# Patient Record
Sex: Female | Born: 1955 | Race: White | Hispanic: No | Smoking: Never smoker
Health system: Southern US, Community
[De-identification: ages and names within clinical notes are randomized; demographics above are authoritative.]

## PROBLEM LIST (undated history)

## (undated) DIAGNOSIS — E119 Type 2 diabetes mellitus without complications: Secondary | ICD-10-CM

## (undated) DIAGNOSIS — I2510 Atherosclerotic heart disease of native coronary artery without angina pectoris: Secondary | ICD-10-CM

## (undated) DIAGNOSIS — I499 Cardiac arrhythmia, unspecified: Secondary | ICD-10-CM

## (undated) DIAGNOSIS — Z923 Personal history of irradiation: Secondary | ICD-10-CM

## (undated) DIAGNOSIS — I5020 Unspecified systolic (congestive) heart failure: Secondary | ICD-10-CM

## (undated) DIAGNOSIS — D0511 Intraductal carcinoma in situ of right breast: Secondary | ICD-10-CM

## (undated) DIAGNOSIS — I10 Essential (primary) hypertension: Secondary | ICD-10-CM

## (undated) DIAGNOSIS — E785 Hyperlipidemia, unspecified: Secondary | ICD-10-CM

## (undated) DIAGNOSIS — I4819 Other persistent atrial fibrillation: Secondary | ICD-10-CM

## (undated) DIAGNOSIS — I255 Ischemic cardiomyopathy: Secondary | ICD-10-CM

## (undated) DIAGNOSIS — I509 Heart failure, unspecified: Secondary | ICD-10-CM

## (undated) DIAGNOSIS — N611 Abscess of the breast and nipple: Secondary | ICD-10-CM

## (undated) DIAGNOSIS — E669 Obesity, unspecified: Secondary | ICD-10-CM

## (undated) DIAGNOSIS — K219 Gastro-esophageal reflux disease without esophagitis: Secondary | ICD-10-CM

## (undated) DIAGNOSIS — C50919 Malignant neoplasm of unspecified site of unspecified female breast: Secondary | ICD-10-CM

## (undated) DIAGNOSIS — G4730 Sleep apnea, unspecified: Secondary | ICD-10-CM

## (undated) DIAGNOSIS — E079 Disorder of thyroid, unspecified: Secondary | ICD-10-CM

## (undated) DIAGNOSIS — K429 Umbilical hernia without obstruction or gangrene: Secondary | ICD-10-CM

## (undated) HISTORY — DX: Sleep apnea, unspecified: G47.30

## (undated) HISTORY — DX: Heart failure, unspecified: I50.9

## (undated) HISTORY — DX: Intraductal carcinoma in situ of right breast: D05.11

## (undated) HISTORY — DX: Ischemic cardiomyopathy: I25.5

## (undated) HISTORY — PX: TONSILLECTOMY AND ADENOIDECTOMY: SUR1326

## (undated) HISTORY — DX: Gastro-esophageal reflux disease without esophagitis: K21.9

## (undated) HISTORY — DX: Unspecified systolic (congestive) heart failure: I50.20

## (undated) HISTORY — DX: Cardiac arrhythmia, unspecified: I49.9

## (undated) HISTORY — DX: Abscess of the breast and nipple: N61.1

## (undated) HISTORY — DX: Essential (primary) hypertension: I10

## (undated) HISTORY — DX: Malignant neoplasm of unspecified site of unspecified female breast: C50.919

## (undated) HISTORY — DX: Atherosclerotic heart disease of native coronary artery without angina pectoris: I25.10

## (undated) HISTORY — DX: Type 2 diabetes mellitus without complications: E11.9

## (undated) HISTORY — DX: Hyperlipidemia, unspecified: E78.5

## (undated) HISTORY — DX: Disorder of thyroid, unspecified: E07.9

## (undated) HISTORY — DX: Other persistent atrial fibrillation: I48.19

## (undated) HISTORY — DX: Obesity, unspecified: E66.9

## (undated) HISTORY — DX: Umbilical hernia without obstruction or gangrene: K42.9

---

## 2004-05-03 ENCOUNTER — Ambulatory Visit: Payer: Self-pay | Admitting: Family Medicine

## 2004-05-06 ENCOUNTER — Ambulatory Visit: Payer: Self-pay | Admitting: Family Medicine

## 2004-11-14 ENCOUNTER — Ambulatory Visit: Payer: Self-pay | Admitting: Family Medicine

## 2004-11-25 ENCOUNTER — Ambulatory Visit: Payer: Self-pay | Admitting: Family Medicine

## 2005-07-10 HISTORY — PX: BREAST BIOPSY: SHX20

## 2005-08-01 ENCOUNTER — Ambulatory Visit: Payer: Self-pay | Admitting: Family Medicine

## 2005-08-09 ENCOUNTER — Ambulatory Visit: Payer: Self-pay | Admitting: Family Medicine

## 2005-08-15 ENCOUNTER — Ambulatory Visit: Payer: Self-pay | Admitting: Family Medicine

## 2006-08-13 ENCOUNTER — Ambulatory Visit: Payer: Self-pay | Admitting: General Surgery

## 2007-04-22 ENCOUNTER — Ambulatory Visit: Payer: Self-pay | Admitting: General Surgery

## 2007-07-11 DIAGNOSIS — I2510 Atherosclerotic heart disease of native coronary artery without angina pectoris: Secondary | ICD-10-CM

## 2007-07-11 HISTORY — DX: Atherosclerotic heart disease of native coronary artery without angina pectoris: I25.10

## 2007-10-01 ENCOUNTER — Ambulatory Visit: Payer: Self-pay | Admitting: General Surgery

## 2008-01-08 ENCOUNTER — Ambulatory Visit: Payer: Self-pay | Admitting: Cardiovascular Disease

## 2008-01-08 HISTORY — PX: CARDIAC CATHETERIZATION: SHX172

## 2008-10-06 ENCOUNTER — Ambulatory Visit: Payer: Self-pay | Admitting: General Surgery

## 2009-06-28 ENCOUNTER — Ambulatory Visit: Payer: Self-pay | Admitting: Family Medicine

## 2009-07-10 HISTORY — PX: BREAST MASS EXCISION: SHX1267

## 2009-07-10 HISTORY — PX: BREAST EXCISIONAL BIOPSY: SUR124

## 2009-07-10 HISTORY — PX: COLONOSCOPY: SHX174

## 2010-01-14 ENCOUNTER — Ambulatory Visit: Payer: Self-pay | Admitting: Family Medicine

## 2010-02-03 ENCOUNTER — Ambulatory Visit: Payer: Self-pay | Admitting: Family Medicine

## 2010-02-18 ENCOUNTER — Ambulatory Visit: Payer: Self-pay | Admitting: Family Medicine

## 2010-04-11 LAB — HM DEXA SCAN: HM DEXA SCAN: NORMAL

## 2010-04-11 LAB — HM PAP SMEAR: HM Pap smear: NORMAL

## 2010-04-26 ENCOUNTER — Ambulatory Visit: Payer: Self-pay | Admitting: Family Medicine

## 2010-05-09 ENCOUNTER — Ambulatory Visit: Payer: Self-pay | Admitting: Family Medicine

## 2010-05-11 ENCOUNTER — Encounter: Payer: Self-pay | Admitting: Orthopedic Surgery

## 2010-06-09 ENCOUNTER — Encounter: Payer: Self-pay | Admitting: Orthopedic Surgery

## 2010-06-16 ENCOUNTER — Ambulatory Visit: Payer: Self-pay | Admitting: Gastroenterology

## 2010-07-05 ENCOUNTER — Ambulatory Visit: Payer: Self-pay | Admitting: General Surgery

## 2010-07-10 DIAGNOSIS — D0511 Intraductal carcinoma in situ of right breast: Secondary | ICD-10-CM

## 2010-07-10 HISTORY — PX: BREAST LUMPECTOMY: SHX2

## 2010-07-10 HISTORY — DX: Intraductal carcinoma in situ of right breast: D05.11

## 2010-07-13 ENCOUNTER — Ambulatory Visit: Payer: Self-pay | Admitting: General Surgery

## 2010-07-13 DIAGNOSIS — C50919 Malignant neoplasm of unspecified site of unspecified female breast: Secondary | ICD-10-CM

## 2010-07-13 HISTORY — DX: Malignant neoplasm of unspecified site of unspecified female breast: C50.919

## 2010-07-21 LAB — PATHOLOGY REPORT

## 2010-08-03 ENCOUNTER — Ambulatory Visit: Payer: Self-pay | Admitting: Radiation Oncology

## 2010-08-10 ENCOUNTER — Ambulatory Visit: Payer: Self-pay | Admitting: Radiation Oncology

## 2010-09-08 ENCOUNTER — Ambulatory Visit: Payer: Self-pay | Admitting: Radiation Oncology

## 2011-03-17 ENCOUNTER — Ambulatory Visit: Payer: Self-pay | Admitting: Rheumatology

## 2011-03-29 ENCOUNTER — Ambulatory Visit: Payer: Self-pay | Admitting: Radiation Oncology

## 2011-04-10 ENCOUNTER — Ambulatory Visit: Payer: Self-pay | Admitting: Radiation Oncology

## 2011-05-04 ENCOUNTER — Ambulatory Visit: Payer: Self-pay | Admitting: General Surgery

## 2011-07-11 HISTORY — PX: HERNIA REPAIR: SHX51

## 2011-07-11 HISTORY — PX: CHOLECYSTECTOMY: SHX55

## 2011-09-28 ENCOUNTER — Ambulatory Visit: Payer: Self-pay | Admitting: Radiation Oncology

## 2011-10-09 ENCOUNTER — Ambulatory Visit: Payer: Self-pay | Admitting: Radiation Oncology

## 2011-11-09 ENCOUNTER — Ambulatory Visit: Payer: Self-pay | Admitting: General Surgery

## 2011-12-06 ENCOUNTER — Encounter: Payer: Self-pay | Admitting: Cardiovascular Disease

## 2011-12-07 ENCOUNTER — Encounter: Payer: Self-pay | Admitting: *Deleted

## 2011-12-13 ENCOUNTER — Ambulatory Visit (INDEPENDENT_AMBULATORY_CARE_PROVIDER_SITE_OTHER): Payer: 59 | Admitting: Cardiovascular Disease

## 2011-12-13 ENCOUNTER — Encounter: Payer: Self-pay | Admitting: Cardiovascular Disease

## 2011-12-13 VITALS — BP 140/88 | HR 101 | Ht 62.0 in | Wt 265.0 lb

## 2011-12-13 DIAGNOSIS — I2510 Atherosclerotic heart disease of native coronary artery without angina pectoris: Secondary | ICD-10-CM

## 2011-12-13 DIAGNOSIS — E785 Hyperlipidemia, unspecified: Secondary | ICD-10-CM

## 2011-12-13 DIAGNOSIS — E669 Obesity, unspecified: Secondary | ICD-10-CM

## 2011-12-13 DIAGNOSIS — I25118 Atherosclerotic heart disease of native coronary artery with other forms of angina pectoris: Secondary | ICD-10-CM | POA: Insufficient documentation

## 2011-12-13 DIAGNOSIS — R0602 Shortness of breath: Secondary | ICD-10-CM

## 2011-12-13 DIAGNOSIS — I10 Essential (primary) hypertension: Secondary | ICD-10-CM

## 2011-12-13 NOTE — Patient Instructions (Signed)
You are doing well. Please add bystolic 10 mg in the PM (continue 20 mg tab in the AM)  Please call us if you have new issues that need to be addressed before your next appt.  Your physician wants you to follow-up in: 1 months  After echo You will receive a reminder letter in the mail two months in advance. If you don't receive a letter, please call our office to schedule the follow-up appointment.

## 2011-12-13 NOTE — Assessment & Plan Note (Signed)
She is not compliant with her Lipitor. We have suggested she take his daily given her elevated LDL particle number.

## 2011-12-13 NOTE — Progress Notes (Signed)
Patient ID: Michele [name], female    DOB: 05/06/1956, 56 y.o.   MRN: [id number]  HPI Comments: Michele [name] is a very pleasant 44 her old woman with morbid obesity, coronary artery disease with 50% proximal LAD disease , followed by a 30% lesion by catheterization July 2009, diabetes, hyperlipidemia, ejection fraction 30% by echocardiogram in June 2009, moderately dilated left atrium and left ventricle, who presents for evaluation of worsening shortness of breath and fatigue. She does have obstructive sleep apnea and wears CPAP.  She reports that symptoms have been getting worse over the past several months. She has worsening shortness of breath with walking to work 2 blocks from her parking lot. She did not have this symptom of shortness of breath before. She also has worsening fatigue. Symptoms are mild, not as bad as 2009. She reports that her sugars have been higher and she has been drinking more fluids. She does have mild edema. She denies any significant chest pain. She is not very compliant with her Lipitor and takes this several times per week. She reports that her weight has been stable. She did start Victoza 10/10/2011 with improvement of her sugars from the low 200s to approximately 185 on regular basis.  EKG shows normal sinus rhythm with rate 100 beats per minute with frequent APCs  Labwork done December 2012 shows LDL 73, total cholesterol 409, HDL 33, LDL particle numbers 1700   Outpatient Encounter Prescriptions as of 12/13/2011  Medication Sig Dispense Refill  . amLODipine-valsartan (EXFORGE) 10-320 MG per tablet Take 1 tablet by mouth daily.      Marland Kitchen aspirin 325 MG tablet Take 325 mg by mouth daily.      Marland Kitchen atorvastatin (LIPITOR) 20 MG tablet Take 20 mg by mouth daily.      . Febuxostat (ULORIC) 80 MG TABS Take 80 mg by mouth daily.      . fluticasone (FLONASE) 50 MCG/ACT nasal spray Place 2 sprays into the nose as needed.      Marland Kitchen levothyroxine (SYNTHROID, LEVOTHROID) 150 MCG tablet  Take 150 mcg by mouth daily.      . Liraglutide (VICTOZA [place]) Inject 1.2 mg into the skin daily.      . Nebivolol HCl (BYSTOLIC PO) Take 20 mg by mouth daily.      . NON FORMULARY CPAP AT BEDTIME      . Omeprazole Magnesium (PRILOSEC OTC PO) Take 20.6 mg by mouth daily.      . sitaGLIPtan-metformin (JANUMET) 50-1000 MG per tablet Take 1 tablet by mouth 2 (two) times daily with a meal.      . tamoxifen (NOLVADEX) 20 MG tablet Take 20 mg by mouth daily.        Review of Systems  Constitutional: Positive for fatigue.  HENT: Negative.   Eyes: Negative.   Respiratory: Positive for shortness of breath.   Cardiovascular: Positive for leg swelling.  Gastrointestinal: Negative.   Musculoskeletal: Negative.   Skin: Negative.   Neurological: Negative.   Hematological: Negative.   Psychiatric/Behavioral: Negative.   All other systems reviewed and are negative.    BP 140/88  Pulse 101  Ht 5\' 2"  (1.575 m)  Wt 265 lb (120.203 kg)  BMI 48.47 kg/m2  Physical Exam  Nursing note and vitals reviewed. Constitutional: She is oriented to person, place, and time. She appears well-developed and well-nourished.       Obese  HENT:  Head: Normocephalic.  Nose: Nose normal.  Mouth/Throat: Oropharynx is clear and moist.  Eyes: Conjunctivae are normal. Pupils are equal, round, and reactive to light.  Neck: Normal range of motion. Neck supple. No JVD present.  Cardiovascular: Normal rate, regular rhythm, S1 normal, S2 normal, normal heart sounds and intact distal pulses.  Exam reveals no gallop and no friction rub.   No murmur heard.      Trace bilateral lower extremity edema above the sock line  Pulmonary/Chest: Effort normal and breath sounds normal. No respiratory distress. She has no wheezes. She has no rales. She exhibits no tenderness.  Abdominal: Soft. Bowel sounds are normal. She exhibits no distension. There is no tenderness.  Musculoskeletal: Normal range of motion. She exhibits no edema and  no tenderness.  Lymphadenopathy:    She has no cervical adenopathy.  Neurological: She is alert and oriented to person, place, and time. Coordination normal.  Skin: Skin is warm and dry. No rash noted. No erythema.  Psychiatric: She has a normal mood and affect. Her behavior is normal. Judgment and thought content normal.         Assessment and Plan

## 2011-12-13 NOTE — Assessment & Plan Note (Signed)
Previous 50% proximal LAD disease. If no improvement of her symptoms with medical management, if symptoms get worse, we would suggest cardiac catheterization.

## 2011-12-13 NOTE — Assessment & Plan Note (Signed)
Mildly elevated blood pressure. Will have extra bystolic 10 mg in the afternoon.

## 2011-12-13 NOTE — Assessment & Plan Note (Addendum)
We had a long discussion with her about her shortness of breath. She does not want to proceed with cardiac catheterization at this time and would prefer to try medical management. I'm concerned about her previous 50% lesion in her LAD on catheterization in 2009.  We have ordered an echocardiogram to exclude fluid overload. She does have trace edema and has been drinking more fluids at work. She is currently not on a diuretic secondary to previous gout.  Heart rate is elevated today in the mid to high 90s, up to 100 even with rest. Uncertain if her heart rate could be contributing to her symptoms as this would get worse with exertion. Blood pressure is elevated mildly. We'll add additional bystolic 10 mg in the afternoon/evening. This could possibly be titrated upwards to 20 mg twice a day. She will monitor her heart rate and blood pressure at home and if possible, with exertion.  Followup in one month

## 2012-01-09 ENCOUNTER — Other Ambulatory Visit (INDEPENDENT_AMBULATORY_CARE_PROVIDER_SITE_OTHER): Payer: 59

## 2012-01-09 ENCOUNTER — Other Ambulatory Visit: Payer: Self-pay

## 2012-01-09 DIAGNOSIS — R0602 Shortness of breath: Secondary | ICD-10-CM

## 2012-01-12 ENCOUNTER — Ambulatory Visit (INDEPENDENT_AMBULATORY_CARE_PROVIDER_SITE_OTHER): Payer: 59 | Admitting: Cardiovascular Disease

## 2012-01-12 ENCOUNTER — Encounter: Payer: Self-pay | Admitting: Cardiovascular Disease

## 2012-01-12 VITALS — BP 162/80 | HR 92 | Ht 62.5 in | Wt 265.0 lb

## 2012-01-12 DIAGNOSIS — R0602 Shortness of breath: Secondary | ICD-10-CM

## 2012-01-12 DIAGNOSIS — E785 Hyperlipidemia, unspecified: Secondary | ICD-10-CM

## 2012-01-12 DIAGNOSIS — I10 Essential (primary) hypertension: Secondary | ICD-10-CM

## 2012-01-12 DIAGNOSIS — I2510 Atherosclerotic heart disease of native coronary artery without angina pectoris: Secondary | ICD-10-CM

## 2012-01-12 MED ORDER — NEBIVOLOL HCL 20 MG PO TABS: 20.0000 mg | ORAL_TABLET | Freq: Two times a day (BID) | ORAL | Status: DC

## 2012-01-12 MED ORDER — CLONIDINE HCL 0.2 MG/24HR TD PTWK: 1.0000 | MEDICATED_PATCH | TRANSDERMAL | Status: DC

## 2012-01-12 NOTE — Progress Notes (Signed)
Patient ID: Michele [name], female    DOB: 11/29/1955, 56 y.o.   MRN: [id number]  HPI Comments: Michele [name] is a very pleasant 56 her old woman with morbid obesity, coronary artery disease with 50% proximal LAD disease , followed by a 30% lesion by catheterization July 2009, diabetes, hyperlipidemia, ejection fraction 30% by echocardiogram in June 2009, moderately dilated left atrium and left ventricle, who presents for evaluation of worsening shortness of breath and fatigue. She does have obstructive sleep apnea and wears CPAP. She has some medication compliance issues as she does forget some of her pills. She has had gout before while on diuretics  On her last clinic visit, she was having worsening shortness of breath with exertion. Recent echocardiogram showed normal LV systolic function, normal right ventricular systolic pressures We increased her beta blocker with mild improvement in her heart rate. She continues to have mild to moderate shortness of breath with exertion. She does not exercise on a regular basis. She has been forgetting some of her medication including her cholesterol medication, aspirin, etc.. in She has not been checking her blood pressure at home but does have a blood pressure cuff.  EKG shows normal sinus rhythm with rate 96 beats per minute with frequent APCs  Labwork done December 2012 shows LDL 73, total cholesterol 409, HDL 33, LDL particle numbers 1700   Outpatient Encounter Prescriptions as of 01/12/2012  Medication Sig Dispense Refill  . amLODipine-valsartan (EXFORGE) 10-320 MG per tablet Take 1 tablet by mouth daily.      Marland Kitchen aspirin 325 MG tablet Take 325 mg by mouth daily.      Marland Kitchen atorvastatin (LIPITOR) 20 MG tablet Take 20 mg by mouth daily.      . Febuxostat (ULORIC) 80 MG TABS Take 80 mg by mouth daily.      . fluticasone (FLONASE) 50 MCG/ACT nasal spray Place 2 sprays into the nose as needed.      Marland Kitchen levothyroxine (SYNTHROID, LEVOTHROID) 150 MCG tablet Take  150 mcg by mouth daily.      . Liraglutide (VICTOZA [place]) Inject 1.2 mg into the skin daily.      . NON FORMULARY CPAP AT BEDTIME      . Omeprazole Magnesium (PRILOSEC OTC PO) Take 20.6 mg by mouth daily.      . sitaGLIPtan-metformin (JANUMET) 50-1000 MG per tablet Take 1 tablet by mouth 2 (two) times daily with a meal.      . tamoxifen (NOLVADEX) 20 MG tablet Take 20 mg by mouth daily.      .  Nebivolol HCl (BYSTOLIC PO) Take 30 mg by mouth daily.        Review of Systems  HENT: Negative.   Eyes: Negative.   Respiratory: Positive for shortness of breath.   Gastrointestinal: Negative.   Musculoskeletal: Negative.   Skin: Negative.   Neurological: Negative.   Hematological: Negative.   Psychiatric/Behavioral: Negative.   All other systems reviewed and are negative.   BP 162/80  Pulse 92  Ht 5' 2.5" (1.588 m)  Wt 265 lb (120.203 kg)  BMI 47.70 kg/m2 Repeat blood pressure shows systolic pressure greater than 170 Physical Exam  Nursing note and vitals reviewed. Constitutional: She is oriented to person, place, and time. She appears well-developed and well-nourished.       Obese  HENT:  Head: Normocephalic.  Nose: Nose normal.  Mouth/Throat: Oropharynx is clear and moist.  Eyes: Conjunctivae are normal. Pupils are equal, round, and reactive to light.  Neck: Normal range of motion. Neck supple. No JVD present.  Cardiovascular: Normal rate, regular rhythm, S1 normal, S2 normal, normal heart sounds and intact distal pulses.  Exam reveals no gallop and no friction rub.   No murmur heard.      Trace bilateral lower extremity edema above the sock line  Pulmonary/Chest: Effort normal and breath sounds normal. No respiratory distress. She has no wheezes. She has no rales. She exhibits no tenderness.  Abdominal: Soft. Bowel sounds are normal. She exhibits no distension. There is no tenderness.  Musculoskeletal: Normal range of motion. She exhibits no edema and no tenderness.    Lymphadenopathy:    She has no cervical adenopathy.  Neurological: She is alert and oriented to person, place, and time. Coordination normal.  Skin: Skin is warm and dry. No rash noted. No erythema.  Psychiatric: She has a normal mood and affect. Her behavior is normal. Judgment and thought content normal.         Assessment and Plan

## 2012-01-12 NOTE — Patient Instructions (Addendum)
Please increase bystolic to 20 mg twice a day  Please start clonidine one patch per week Monitor blood pressure  Please call us if you have new issues that need to be addressed before your next appt.  Your physician wants you to follow-up in: 2 months.

## 2012-01-12 NOTE — Assessment & Plan Note (Signed)
Normal echocardiogram. She did not want catheterization based on her previous visit. Normal right jugular systolic pressure. We will not use diuretics given normal echo. We will work on her blood pressure and heart rate. Medication changes as above.

## 2012-01-12 NOTE — Assessment & Plan Note (Signed)
Currently with no symptoms of angina. No further workup at this time. Continue current medication regimen. 

## 2012-01-12 NOTE — Assessment & Plan Note (Signed)
We have encouraged her to work on her weight. Continue Lipitor

## 2012-01-12 NOTE — Assessment & Plan Note (Signed)
Blood pressure continues to be elevated. We will add extra beta blocker, increasing the bystolic to 20 mg twice a day. She has difficulty taking pills and forgets to take them. We will try a clonidine patch 0.2 mg one patch per week. This can be titrated upwards as tolerated.

## 2012-03-01 ENCOUNTER — Ambulatory Visit: Payer: Self-pay | Admitting: Family Medicine

## 2012-03-14 ENCOUNTER — Encounter: Payer: Self-pay | Admitting: Cardiovascular Disease

## 2012-03-14 ENCOUNTER — Ambulatory Visit (INDEPENDENT_AMBULATORY_CARE_PROVIDER_SITE_OTHER): Payer: 59 | Admitting: Cardiovascular Disease

## 2012-03-14 VITALS — BP 142/90 | HR 88 | Ht 62.0 in | Wt 259.8 lb

## 2012-03-14 DIAGNOSIS — E785 Hyperlipidemia, unspecified: Secondary | ICD-10-CM

## 2012-03-14 DIAGNOSIS — E669 Obesity, unspecified: Secondary | ICD-10-CM

## 2012-03-14 DIAGNOSIS — I10 Essential (primary) hypertension: Secondary | ICD-10-CM

## 2012-03-14 DIAGNOSIS — E119 Type 2 diabetes mellitus without complications: Secondary | ICD-10-CM | POA: Insufficient documentation

## 2012-03-14 DIAGNOSIS — I2510 Atherosclerotic heart disease of native coronary artery without angina pectoris: Secondary | ICD-10-CM

## 2012-03-14 MED ORDER — CLONIDINE HCL 0.2 MG/24HR TD PTWK: 1.0000 | MEDICATED_PATCH | TRANSDERMAL | Status: DC

## 2012-03-14 NOTE — Assessment & Plan Note (Signed)
We have encouraged continued exercise, careful diet management in an effort to lose weight. 

## 2012-03-14 NOTE — Assessment & Plan Note (Signed)
Currently with no symptoms of angina. No further workup at this time. Continue current medication regimen. She would be acceptable risk for upcoming gallbladder surgery. no further testing.

## 2012-03-14 NOTE — Patient Instructions (Addendum)
You are doing well. No medication changes were made.  Please call us if you have new issues that need to be addressed before your next appt.  Your physician wants you to follow-up in: 6 months.  You will receive a reminder letter in the mail two months in advance. If you don't receive a letter, please call our office to schedule the follow-up appointment.   

## 2012-03-14 NOTE — Assessment & Plan Note (Signed)
We have recommended that she be consistent with her Lipitor. Goal LDL less than 70.  She has been noncompliant recently.

## 2012-03-14 NOTE — Progress Notes (Signed)
Patient ID: Michele [name], female    DOB: 1956-04-27, 56 y.o.   MRN: [id number]  HPI Comments: Michele [name] is a very pleasant 56 her old woman with morbid obesity, coronary artery disease with 50% proximal LAD disease , followed by a 30% lesion by catheterization July 2009, diabetes, hyperlipidemia, ejection fraction 30% by echocardiogram in June 2009, moderately dilated left atrium and left ventricle, who presents for evaluation of worsening shortness of breath and fatigue. She does have obstructive sleep apnea and wears CPAP. She has some medication compliance issues as she does forget some of her pills. She has had gout before while on diuretics  She reports that her shortness of breath has improved. She is tolerating clonidine patch well with no side effects. She continues on beta blocker twice a day and exforge. A pressure typically runs in the 120-140 systolic range over 80s. She is scheduled to have gallbladder surgery. She has a flareup of gout and a sty on her right eye.  Previous echocardiogram showed normal LV systolic function, normal right ventricular systolic pressures  EKG shows normal sinus rhythm with rate 96 beats per minute with frequent APCs Labwork done December 2012 shows LDL 73, total cholesterol 409, HDL 33, LDL particle numbers 1700 (noncompliant with statin) are in 1 a 50   Outpatient Encounter Prescriptions as of 03/14/2012  Medication Sig Dispense Refill  . amLODipine-valsartan (EXFORGE) 10-320 MG per tablet Take 1 tablet by mouth daily.      Marland Kitchen aspirin 325 MG tablet Take 325 mg by mouth daily.      Marland Kitchen atorvastatin (LIPITOR) 20 MG tablet Take 20 mg by mouth daily.      . cloNIDine (CATAPRES - DOSED IN MG/24 HR) 0.2 mg/24hr patch Place 1 patch (0.2 mg total) onto the skin once a week.  12 patch  3  . colchicine 0.6 MG tablet Take 0.6 mg by mouth daily.      . Febuxostat (ULORIC) 80 MG TABS Take 80 mg by mouth daily.      . fluticasone (FLONASE) 50 MCG/ACT nasal spray  Place 2 sprays into the nose as needed.      Marland Kitchen levothyroxine (SYNTHROID, LEVOTHROID) 175 MCG tablet Take 175 mcg by mouth daily.      . Liraglutide (VICTOZA) 18 MG/3ML SOLN Inject 1.8 mg into the skin.      . Nebivolol HCl (BYSTOLIC) 20 MG TABS Take 1 tablet (20 mg total) by mouth 2 (two) times daily.  180 tablet  3  . NON FORMULARY CPAP AT BEDTIME      . Omeprazole Magnesium (PRILOSEC OTC PO) Take 20.6 mg by mouth daily.      . sitaGLIPtan-metformin (JANUMET) 50-1000 MG per tablet Take 1 tablet by mouth 2 (two) times daily with a meal.      . tamoxifen (NOLVADEX) 20 MG tablet Take 20 mg by mouth daily.        Review of Systems  HENT: Negative.   Eyes: Negative.   Gastrointestinal: Negative.   Musculoskeletal: Negative.   Skin: Negative.   Neurological: Negative.   Hematological: Negative.   Psychiatric/Behavioral: Negative.   All other systems reviewed and are negative.   BP 142/90  Pulse 88  Ht 5\' 2"  (1.575 m)  Wt 259 lb 12 oz (117.822 kg)  BMI 47.51 kg/m2  Physical Exam  Nursing note and vitals reviewed. Constitutional: She is oriented to person, place, and time. She appears well-developed and well-nourished.       Obese  Sty on her right eye  HENT:  Head: Normocephalic.  Nose: Nose normal.  Mouth/Throat: Oropharynx is clear and moist.  Eyes: Conjunctivae are normal. Pupils are equal, round, and reactive to light.  Neck: Normal range of motion. Neck supple. No JVD present.  Cardiovascular: Normal rate, regular rhythm, S1 normal, S2 normal, normal heart sounds and intact distal pulses.  Exam reveals no gallop and no friction rub.   No murmur heard.      Trace bilateral lower extremity edema above the sock line  Pulmonary/Chest: Effort normal and breath sounds normal. No respiratory distress. She has no wheezes. She has no rales. She exhibits no tenderness.  Abdominal: Soft. Bowel sounds are normal. She exhibits no distension. There is no tenderness.  Musculoskeletal:  Normal range of motion. She exhibits no edema and no tenderness.  Lymphadenopathy:    She has no cervical adenopathy.  Neurological: She is alert and oriented to person, place, and time. Coordination normal.  Skin: Skin is warm and dry. No rash noted. No erythema.  Psychiatric: She has a normal mood and affect. Her behavior is normal. Judgment and thought content normal.         Assessment and Plan

## 2012-03-14 NOTE — Assessment & Plan Note (Signed)
Blood pressure is well controlled on today's visit. No changes made to the medications. 

## 2012-03-14 NOTE — Assessment & Plan Note (Signed)
She reports hemoglobin A1c in the 8 range. We have encouraged her to watch her diet more closely.

## 2012-03-19 ENCOUNTER — Ambulatory Visit: Payer: Self-pay | Admitting: General Surgery

## 2012-03-19 LAB — CBC WITH DIFFERENTIAL/PLATELET
Basophil #: 0.1 10*3/uL (ref 0.0–0.1)
Eosinophil #: 0.2 10*3/uL (ref 0.0–0.7)
Eosinophil %: 1.5 %
HCT: 38.8 % (ref 35.0–47.0)
HGB: 13.4 g/dL (ref 12.0–16.0)
Lymphocyte #: 3.2 10*3/uL (ref 1.0–3.6)
Monocyte #: 0.9 x10 3/mm (ref 0.2–0.9)
Neutrophil %: 58.2 %
RDW: 12.8 % (ref 11.5–14.5)
WBC: 10.5 10*3/uL (ref 3.6–11.0)

## 2012-03-21 ENCOUNTER — Ambulatory Visit: Payer: Self-pay | Admitting: General Surgery

## 2012-03-22 LAB — PATHOLOGY REPORT

## 2012-05-13 ENCOUNTER — Ambulatory Visit: Payer: Self-pay | Admitting: General Surgery

## 2012-08-19 DIAGNOSIS — N611 Abscess of the breast and nipple: Secondary | ICD-10-CM

## 2012-08-19 HISTORY — PX: INCISION AND DRAINAGE BREAST ABSCESS: SUR672

## 2012-08-19 HISTORY — DX: Abscess of the breast and nipple: N61.1

## 2012-08-22 ENCOUNTER — Encounter: Payer: Self-pay | Admitting: General Surgery

## 2012-09-25 ENCOUNTER — Ambulatory Visit: Payer: Self-pay | Admitting: Radiation Oncology

## 2012-09-26 ENCOUNTER — Encounter: Payer: Self-pay | Admitting: General Surgery

## 2012-09-26 ENCOUNTER — Ambulatory Visit (INDEPENDENT_AMBULATORY_CARE_PROVIDER_SITE_OTHER): Payer: 59 | Admitting: General Surgery

## 2012-09-26 VITALS — BP 140/76 | HR 76 | Resp 14 | Ht 62.0 in | Wt 250.0 lb

## 2012-09-26 DIAGNOSIS — N641 Fat necrosis of breast: Secondary | ICD-10-CM

## 2012-09-26 NOTE — Patient Instructions (Addendum)
Call for concerns or questions.

## 2012-09-26 NOTE — Progress Notes (Signed)
Subjective:     Patient ID: Michele [name], female   DOB: August 27, 1955, 57 y.o.   MRN: [id number]  HPI Patient here today for follow up right breast abscess.  States area is much smaller but still draining some.  Feels like it is "pinching " occasionally.   Review of Systems  Constitutional: Negative.   Respiratory: Negative.   Cardiovascular: Negative.        Objective:   Physical Exam  Constitutional: She is oriented to person, place, and time. She appears well-developed and well-nourished.  Neurological: She is alert and oriented to person, place, and time.  Skin: Skin is warm and dry.   Area of concern is improving.     Assessment:     Fat necrosis     Plan:     The area of induration and inflammation is significantly smaller. Probing showed a small amount of saponified fat without odor. I anticipate this will want to resolve spontaneously and not require formal excision.

## 2012-10-16 ENCOUNTER — Ambulatory Visit (INDEPENDENT_AMBULATORY_CARE_PROVIDER_SITE_OTHER): Payer: 59 | Admitting: General Surgery

## 2012-10-16 ENCOUNTER — Encounter: Payer: Self-pay | Admitting: General Surgery

## 2012-10-16 VITALS — BP 138/70 | HR 82 | Resp 16 | Ht 62.0 in | Wt 251.0 lb

## 2012-10-16 DIAGNOSIS — N641 Fat necrosis of breast: Secondary | ICD-10-CM

## 2012-10-16 DIAGNOSIS — N61 Mastitis without abscess: Secondary | ICD-10-CM

## 2012-10-16 DIAGNOSIS — Z853 Personal history of malignant neoplasm of breast: Secondary | ICD-10-CM

## 2012-10-16 NOTE — Patient Instructions (Addendum)
Patient to return in November 2014 with bilateral diagnostic mammograms.

## 2012-10-16 NOTE — Progress Notes (Signed)
Patient ID: Michele [name], female   DOB: 26-Jan-1956, 57 y.o.   MRN: [id number]  Chief Complaint  Patient presents with  . Follow-up    breast abscess    HPI Michele [name] is a 57 y.o. female here today following up from her right breast abscess. Patient reports the breast is not red or swollen.   HPI  Past Medical History  Diagnosis Date  . Hypertension   . CHF (congestive heart failure)   . Breast cancer   . Ischemic cardiomyopathy   . Diabetes mellitus     Type II  . Hyperlipidemia   . Coronary artery disease 2009  . Thyroid disease     hypothyroidism  . DCIS (ductal carcinoma in situ) of breast, right 2012  . GERD (gastroesophageal reflux disease)   . Sleep apnea   . Abscess of breast, right 08/19/2012    Past Surgical History  Procedure Laterality Date  . Cardiac catheterization  01/08/2008  . Incision and drainage breast abscess Right 08/19/2012  . Breast lumpectomy Right Jan 2012    Family History  Problem Relation Age of Onset  . Heart failure Mother     Social History History  Substance Use Topics  . Smoking status: Never Smoker   . Smokeless tobacco: Never Used  . Alcohol Use: No    No Known Allergies  Current Outpatient Prescriptions  Medication Sig Dispense Refill  . amLODipine-valsartan (EXFORGE) 10-320 MG per tablet Take 1 tablet by mouth daily.      Marland Kitchen aspirin 325 MG tablet Take 325 mg by mouth daily.      Marland Kitchen atorvastatin (LIPITOR) 20 MG tablet Take 20 mg by mouth daily.      . cloNIDine (CATAPRES - DOSED IN MG/24 HR) 0.2 mg/24hr patch Place 1 patch (0.2 mg total) onto the skin once a week.  12 patch  3  . colchicine 0.6 MG tablet Take 0.6 mg by mouth daily.      . Febuxostat (ULORIC) 80 MG TABS Take 80 mg by mouth daily.      . fluticasone (FLONASE) 50 MCG/ACT nasal spray Place 2 sprays into the nose as needed.      . INVOKANA 100 MG TABS 1 tablet.      Marland Kitchen levothyroxine (SYNTHROID, LEVOTHROID) 175 MCG tablet Take 175 mcg by mouth daily.       . Liraglutide (VICTOZA) 18 MG/3ML SOLN Inject 1.8 mg into the skin.      . Nebivolol HCl (BYSTOLIC) 20 MG TABS Take 1 tablet (20 mg total) by mouth 2 (two) times daily.  180 tablet  3  . NON FORMULARY CPAP AT BEDTIME      . Omeprazole Magnesium (PRILOSEC OTC PO) Take 20.6 mg by mouth daily.      . sitaGLIPtan-metformin (JANUMET) 50-1000 MG per tablet Take 1 tablet by mouth 2 (two) times daily with a meal.      . tamoxifen (NOLVADEX) 20 MG tablet Take 20 mg by mouth daily.       No current facility-administered medications for this visit.    Review of Systems Review of Systems  Constitutional: Negative.   Respiratory: Negative.   Cardiovascular: Negative.     Blood pressure 138/70, pulse 82, resp. rate 16, height 5\' 2"  (1.575 m), weight 251 lb (113.853 kg).  Physical Exam Physical Exam  Constitutional: She appears well-developed and well-nourished.  Pulmonary/Chest:  Right breast healing well used a silver nitrate.   Skin: Skin is dry.    Data  Reviewed None  Assessment    Resolving aseptic fat necrosis of the right breast    Plan    Will resume annual screening exam as previously scheduled. The patient will call she has a relapse in the breast.       Earline Mayotte 10/17/2012, 4:09 PM

## 2012-10-17 ENCOUNTER — Encounter: Payer: Self-pay | Admitting: General Surgery

## 2012-12-24 ENCOUNTER — Encounter: Payer: Self-pay | Admitting: *Deleted

## 2013-05-27 ENCOUNTER — Ambulatory Visit: Payer: Self-pay | Admitting: General Surgery

## 2013-06-03 ENCOUNTER — Encounter: Payer: Self-pay | Admitting: General Surgery

## 2013-06-11 ENCOUNTER — Ambulatory Visit: Payer: 59 | Admitting: General Surgery

## 2013-06-19 ENCOUNTER — Encounter: Payer: Self-pay | Admitting: General Surgery

## 2013-06-19 ENCOUNTER — Ambulatory Visit (INDEPENDENT_AMBULATORY_CARE_PROVIDER_SITE_OTHER): Payer: 59 | Admitting: General Surgery

## 2013-06-19 VITALS — BP 140/82 | HR 72 | Resp 14 | Ht 62.0 in | Wt 238.0 lb

## 2013-06-19 DIAGNOSIS — R920 Mammographic microcalcification found on diagnostic imaging of breast: Secondary | ICD-10-CM | POA: Insufficient documentation

## 2013-06-19 DIAGNOSIS — Z853 Personal history of malignant neoplasm of breast: Secondary | ICD-10-CM

## 2013-06-19 MED ORDER — TAMOXIFEN CITRATE 20 MG PO TABS: 20.0000 mg | ORAL_TABLET | Freq: Every day | ORAL | Status: DC

## 2013-06-19 NOTE — Patient Instructions (Addendum)
Continue self breast exams. Call office for any new breast issues or concerns. Follow up in 6 months with left diagnostic mammogram and office visit.     

## 2013-06-19 NOTE — Progress Notes (Signed)
Patient ID: Michele [name], female   DOB: Jan 27, 1956, 57 y.o.   MRN: [id number]  Chief Complaint  Patient presents with  . Follow-up    mammogram    HPI Michele [name] is a 57 y.o. female.  who presents for her follow up breast evaluation. The most recent mammogram was done on 05-27-13.  Patient does perform regular self breast checks and gets regular mammograms done.  No new breast issues.  HPI  Past Medical History  Diagnosis Date  . Hypertension   . CHF (congestive heart failure)   . Breast cancer   . Ischemic cardiomyopathy   . Diabetes mellitus     Type II  . Hyperlipidemia   . Coronary artery disease 2009  . Thyroid disease     hypothyroidism  . DCIS (ductal carcinoma in situ) of breast, right 2012  . GERD (gastroesophageal reflux disease)   . Sleep apnea   . Abscess of breast, right 08/19/2012  . Umbilical hernia without mention of obstruction or gangrene   . Obesity, unspecified     Past Surgical History  Procedure Laterality Date  . Cardiac catheterization  01/08/2008  . Incision and drainage breast abscess Right 08/19/2012  . Breast lumpectomy Right Jan 2012  . Breast biopsy Right 2007  . Tonsillectomy and adenoidectomy      age 52 yrs  . Hernia repair  2013    epigastric  . Cholecystectomy  2013  . [facility]  . Breast mass excision Right 2011    December    Family History  Problem Relation Age of Onset  . Heart failure Mother     Social History History  Substance Use Topics  . Smoking status: Never Smoker   . Smokeless tobacco: Never Used  . Alcohol Use: No    No Known Allergies  Current Outpatient Prescriptions  Medication Sig Dispense Refill  . allopurinol (ZYLOPRIM) 300 MG tablet       . amLODipine-valsartan (EXFORGE) 10-320 MG per tablet Take 1 tablet by mouth daily.      Marland Kitchen aspirin 325 MG tablet Take 325 mg by mouth daily.      Marland Kitchen atorvastatin (LIPITOR) 20 MG tablet Take 20 mg by mouth daily.      .  colchicine 0.6 MG tablet Take 0.6 mg by mouth as needed.       . fluticasone (FLONASE) 50 MCG/ACT nasal spray Place 2 sprays into the nose as needed.      . INVOKANA 100 MG TABS 1 tablet.      Marland Kitchen levothyroxine (SYNTHROID, LEVOTHROID) 175 MCG tablet Take 175 mcg by mouth daily.      . Nebivolol HCl (BYSTOLIC) 20 MG TABS Take 1 tablet (20 mg total) by mouth 2 (two) times daily.  180 tablet  3  . NON FORMULARY CPAP AT BEDTIME      . sitaGLIPtan-metformin (JANUMET) 50-1000 MG per tablet Take 1 tablet by mouth 2 (two) times daily with a meal.      . tamoxifen (NOLVADEX) 20 MG tablet Take 20 mg by mouth daily.      . tamoxifen (NOLVADEX) 20 MG tablet Take 1 tablet (20 mg total) by mouth daily.  90 tablet  4   No current facility-administered medications for this visit.    Review of Systems Review of Systems  Constitutional: Negative.   Respiratory: Negative.   Cardiovascular: Negative.     Blood pressure 140/82, pulse 72, resp. rate 14, height  5\' 2"  (1.575 m), weight 238 lb (107.956 kg).  Physical Exam Physical Exam  Constitutional: She is oriented to person, place, and time. She appears well-developed and well-nourished.  Neck: Neck supple.  Cardiovascular: Normal rate, regular rhythm and normal heart sounds.   Pulmonary/Chest: Effort normal and breath sounds normal. Right breast exhibits no inverted nipple, no mass, no nipple discharge, no skin change and no tenderness. Left breast exhibits no inverted nipple, no mass, no nipple discharge, no skin change and no tenderness.  Lymphadenopathy:    She has no cervical adenopathy.    She has no axillary adenopathy.  Neurological: She is alert and oriented to person, place, and time.  Skin: Skin is warm and dry.    Data Reviewed Bilateral mammograms dated May 27, 2013 were reviewed. The treated breast is unremarkable. The contralateral left breast shows 4-5 calcifications which have attracted the radiologist side.  BI-RAD-3.  Assessment    DCIS right breast, no 3 years post wide excision and partial breast radiation.     Plan    Follow up in 6 months with left diagnostic mammogram and office visit.       Earline Mayotte 06/19/2013, 7:42 PM

## 2013-07-17 ENCOUNTER — Encounter: Payer: Self-pay | Admitting: General Surgery

## 2013-09-03 LAB — CBC AND DIFFERENTIAL
HEMATOCRIT: 46 % (ref 36–46)
Hemoglobin: 15.4 g/dL (ref 12.0–16.0)
NEUTROS ABS: 9 /uL
PLATELETS: 336 10*3/uL (ref 150–399)
WBC: 13.8 10*3/mL

## 2013-11-13 ENCOUNTER — Telehealth: Payer: Self-pay | Admitting: *Deleted

## 2013-11-13 NOTE — Telephone Encounter (Signed)
Pt called last week sometime and said she talked to you about her mammogram. She was suppose to have her mammogram at [facility] on 11/20/13 but she doesn't want to go there she said that she still wants to stay at [facility], so she wanted to talk to you about this.

## 2013-11-27 ENCOUNTER — Ambulatory Visit: Payer: Self-pay | Admitting: General Surgery

## 2013-11-27 ENCOUNTER — Encounter: Payer: Self-pay | Admitting: General Surgery

## 2013-11-27 ENCOUNTER — Ambulatory Visit: Payer: 59 | Admitting: General Surgery

## 2013-12-09 ENCOUNTER — Encounter: Payer: Self-pay | Admitting: General Surgery

## 2013-12-09 ENCOUNTER — Ambulatory Visit (INDEPENDENT_AMBULATORY_CARE_PROVIDER_SITE_OTHER): Payer: 59 | Admitting: General Surgery

## 2013-12-09 VITALS — BP 130/80 | HR 72 | Resp 14 | Ht 62.0 in | Wt 231.0 lb

## 2013-12-09 DIAGNOSIS — D0510 Intraductal carcinoma in situ of unspecified breast: Secondary | ICD-10-CM

## 2013-12-09 DIAGNOSIS — D0590 Unspecified type of carcinoma in situ of unspecified breast: Secondary | ICD-10-CM

## 2013-12-09 NOTE — Progress Notes (Signed)
Patient ID: Michele [name], female   DOB: [birth year], 58 y.o.   MRN: [id number]  Chief Complaint  Patient presents with  . Follow-up    mammogram    HPI Michele [name] is a 58 y.o. female.  who presents for her follow up left mammogram and breast evaluation. The most recent mammogram was done on 11-27-13.  Patient does perform self breast checks randomly and gets regular mammograms done.  No new breast issues, tolerating tamoxifen well. Admits to bursitis in right hip and using naprosyn as needed.  Various eczema rash develops each one or, usually along the umbilical area and underneath the left breast. This year she had a new area developed on the right breast above the incision. She has made use of calamine lotion as needed.the other areas have resolved, the right breast area which was last developed is slowly fading.  HPI  Past Medical History  Diagnosis Date  . Hypertension   . CHF (congestive heart failure)   . Ischemic cardiomyopathy   . Diabetes mellitus     Type II  . Hyperlipidemia   . Coronary artery disease 2009  . Thyroid disease     hypothyroidism  . DCIS (ductal carcinoma in situ) of breast, right 2012  . GERD (gastroesophageal reflux disease)   . Sleep apnea   . Abscess of breast, right 08/19/2012  . Umbilical hernia without mention of obstruction or gangrene   . Obesity, unspecified   . Breast cancer July 13, 2010    intermediate grade DCIS, nuclear grade 2, ER 90%, PR 90% treated with wide excision, reexcision 2 negative margins and MammoSite partial breast radiation.    Past Surgical History  Procedure Laterality Date  . Cardiac catheterization  01/08/2008  . Incision and drainage breast abscess Right 08/19/2012  . Tonsillectomy and adenoidectomy      age 36 yrs  . Hernia repair  2013    epigastric  . Cholecystectomy  2013  [facility]  . Breast mass excision Right 2011    December  . Breast lumpectomy Right Jan 2012     Wide excision  . Breast biopsy Right 2007    Family History  Problem Relation Age of Onset  . Heart failure Mother     Social History History  Substance Use Topics  . Smoking status: Never Smoker   . Smokeless tobacco: Never Used  . Alcohol Use: No    No Known Allergies  Current Outpatient Prescriptions  Medication Sig Dispense Refill  . allopurinol (ZYLOPRIM) 300 MG tablet       . amLODipine-valsartan (EXFORGE) 10-320 MG per tablet Take 1 tablet by mouth daily.      Marland Kitchen aspirin 325 MG tablet Take 325 mg by mouth daily.      . colchicine 0.6 MG tablet Take 0.6 mg by mouth as needed.       . fluticasone (FLONASE) 50 MCG/ACT nasal spray Place 2 sprays into the nose as needed.      . INVOKANA 100 MG TABS 1 tablet.      Marland Kitchen levothyroxine (SYNTHROID, LEVOTHROID) 175 MCG tablet Take 200 mcg by mouth daily.       . naproxen (NAPROSYN) 500 MG tablet Take 500 mg by mouth as needed.       . Nebivolol HCl (BYSTOLIC) 20 MG TABS Take 1 tablet (20 mg total) by mouth 2 (two) times daily.  180 tablet  3  . NON FORMULARY CPAP  AT BEDTIME      . sitaGLIPtan-metformin (JANUMET) 50-1000 MG per tablet Take 1 tablet by mouth 2 (two) times daily with a meal.      . tamoxifen (NOLVADEX) 20 MG tablet Take 1 tablet (20 mg total) by mouth daily.  90 tablet  4   No current facility-administered medications for this visit.    Review of Systems Review of Systems  Constitutional: Negative.   Respiratory: Negative.   Cardiovascular: Negative.     Blood pressure 130/80, pulse 72, resp. rate 14, height 5\' 2"  (1.575 m), weight 231 lb (104.781 kg).  Physical Exam Physical Exam  Constitutional: She is oriented to person, place, and time. She appears well-developed and well-nourished.  Neck: Neck supple.  Cardiovascular: Normal rate, regular rhythm and normal heart sounds.   Pulmonary/Chest: Effort normal and breath sounds normal. Right breast exhibits skin change. Right breast exhibits no inverted nipple,  no mass, no nipple discharge and no tenderness. Left breast exhibits no inverted nipple, no mass, no nipple discharge, no skin change and no tenderness.    Well healed scar at 3 o'clock with mild volume loss and a 3 x 5 area of rash right breast  Lymphadenopathy:    She has no cervical adenopathy.  Neurological: She is alert and oriented to person, place, and time.  Skin: Skin is warm and dry.    Data Reviewed Left breast mammograms dated Nov 27, 2013 were completed due a area of microcalcifications noted on her November 2014 exam. No interval change. BI-RAD-3.  Assessment    Benign breast exam. Good tolerance of tamoxifen therapy.    Plan    Arrangements were made for bilateral diagnostic mammograms in 6 months.  The patient's husband retired earlier this week and will be asked him to see how things go now that he is on full time having worked 80 hours per week at [employer] for 40 some years.     Call in August if rash not completely gone.  PCP: Mitchel Honour Tekisha Darcey 12/09/2013, 8:45 PM

## 2013-12-09 NOTE — Patient Instructions (Addendum)
Continue self breast exams. Call office for any new breast issues or concerns. Call in August if rash not completely gone.

## 2013-12-31 LAB — TSH: TSH: 2.36 u[IU]/mL (ref <=5.90)

## 2014-04-13 ENCOUNTER — Encounter: Payer: Self-pay | Admitting: Surgery

## 2014-04-28 ENCOUNTER — Telehealth: Payer: Self-pay | Admitting: *Deleted

## 2014-04-28 NOTE — Telephone Encounter (Signed)
I talked with the patient and she does take the Tamoxifen 20 mg daily and she gets 3 months at a time mail order.

## 2014-04-29 LAB — HEPATIC FUNCTION PANEL
ALT: 22 U/L (ref 7–35)
AST: 29 U/L (ref 13–35)
Alkaline Phosphatase: 43 U/L (ref 25–125)
Bilirubin, Total: 0.3 mg/dL

## 2014-04-29 LAB — BASIC METABOLIC PANEL
BUN: 15 mg/dL (ref 4–21)
CREATININE: 0.7 mg/dL (ref <=1.1)
Glucose: 286 mg/dL
POTASSIUM: 4.3 mmol/L (ref 3.4–5.3)
SODIUM: 138 mmol/L (ref 137–147)

## 2014-04-29 LAB — LIPID PANEL
CHOLESTEROL: 155 mg/dL (ref 0–200)
HDL: 25 mg/dL — AB (ref 35–70)
LDL Cholesterol: 69 mg/dL
LDl/HDL Ratio: 2.8
Triglycerides: 306 mg/dL — AB (ref 40–160)

## 2014-05-11 ENCOUNTER — Encounter: Payer: Self-pay | Admitting: General Surgery

## 2014-05-26 ENCOUNTER — Ambulatory Visit: Payer: Self-pay | Admitting: General Surgery

## 2014-05-26 ENCOUNTER — Encounter: Payer: Self-pay | Admitting: General Surgery

## 2014-06-02 ENCOUNTER — Ambulatory Visit (INDEPENDENT_AMBULATORY_CARE_PROVIDER_SITE_OTHER): Payer: 59 | Admitting: General Surgery

## 2014-06-02 ENCOUNTER — Encounter: Payer: Self-pay | Admitting: General Surgery

## 2014-06-02 VITALS — BP 120/70 | HR 74 | Resp 12 | Ht 62.0 in | Wt 231.0 lb

## 2014-06-02 DIAGNOSIS — D0511 Intraductal carcinoma in situ of right breast: Secondary | ICD-10-CM

## 2014-06-02 NOTE — Progress Notes (Signed)
Patient ID: [name], female   DOB: June 10, 1956, 58 y.o.   MRN: [id number]  Chief Complaint  Patient presents with  . Follow-up    mammogram    HPI Doralene ANGEE GUPTON is a 58 y.o. female who presents for a breast evaluation. The most recent mammogram was done on 05/26/14.  Patient does perform regular self breast checks and gets regular mammograms done.    HPI  Past Medical History  Diagnosis Date  . Hypertension   . CHF (congestive heart failure)   . Ischemic cardiomyopathy   . Diabetes mellitus     Type II  . Hyperlipidemia   . Coronary artery disease 2009  . Thyroid disease     hypothyroidism  . DCIS (ductal carcinoma in situ) of breast, right 2012  . GERD (gastroesophageal reflux disease)   . Sleep apnea   . Abscess of breast, right 08/19/2012  . Umbilical hernia without mention of obstruction or gangrene   . Obesity, unspecified   . Breast cancer July 13, 2010    intermediate grade DCIS, nuclear grade 2, ER 90%, PR 90% treated with wide excision, reexcision 2 negative margins and MammoSite partial breast radiation.    Past Surgical History  Procedure Laterality Date  . Cardiac catheterization  01/08/2008  . Incision and drainage breast abscess Right 08/19/2012  . Tonsillectomy and adenoidectomy      age 51 yrs  . Hernia repair  2013    epigastric  . Cholecystectomy  2013  . [facility]  . Breast mass excision Right 2011    December  . Breast lumpectomy Right Jan 2012    Wide excision  . Breast biopsy Right 2007    Family History  Problem Relation Age of Onset  . Heart failure Mother     Social History History  Substance Use Topics  . Smoking status: Never Smoker   . Smokeless tobacco: Never Used  . Alcohol Use: No    No Known Allergies  Current Outpatient Prescriptions  Medication Sig Dispense Refill  . allopurinol (ZYLOPRIM) 300 MG tablet     . amLODipine-valsartan (EXFORGE) 10-320 MG per tablet Take 1 tablet by  mouth daily.    Marland Kitchen aspirin 325 MG tablet Take 325 mg by mouth daily.    . colchicine 0.6 MG tablet Take 0.6 mg by mouth as needed.     . fluticasone (FLONASE) 50 MCG/ACT nasal spray Place 2 sprays into the nose as needed.    . INVOKANA 100 MG TABS 1 tablet.    Marland Kitchen levothyroxine (SYNTHROID, LEVOTHROID) 175 MCG tablet Take 200 mcg by mouth daily.     . Nebivolol HCl (BYSTOLIC) 20 MG TABS Take 1 tablet (20 mg total) by mouth 2 (two) times daily. 180 tablet 3  . NON FORMULARY CPAP AT BEDTIME    . sitaGLIPtan-metformin (JANUMET) 50-1000 MG per tablet Take 1 tablet by mouth 2 (two) times daily with a meal.    . tamoxifen (NOLVADEX) 20 MG tablet Take 1 tablet (20 mg total) by mouth daily. 90 tablet 4   No current facility-administered medications for this visit.    Review of Systems Review of Systems  Constitutional: Negative.   Respiratory: Negative.   Cardiovascular: Negative.     Blood pressure 120/70, pulse 74, resp. rate 12, height 5\' 2"  (1.575 m), weight 231 lb (104.781 kg).  Physical Exam Physical Exam  Constitutional: She is oriented to person, place, and time. She appears  well-developed and well-nourished.  Eyes: Conjunctivae are normal. No scleral icterus.  Neck: Neck supple.  Cardiovascular: Normal rate, regular rhythm and normal heart sounds.   Pulmonary/Chest: Effort normal and breath sounds normal. Right breast exhibits no inverted nipple, no mass, no nipple discharge, no skin change and no tenderness. Left breast exhibits no inverted nipple, no mass, no nipple discharge, no skin change and no tenderness.  Right breast incision well healed, volume loss at 9 o'clock.  Lymphadenopathy:    She has no cervical adenopathy.    She has no axillary adenopathy.  Neurological: She is alert and oriented to person, place, and time.  Skin: Skin is warm and dry.    Data Reviewed Bilateral mammograms dated 05/26/2014 were reviewed and compared to previous studies. Previously identified  group of calcifications in the left breast are unchanged. Right breast is unremarkable except for post lumpectomy changes. BI-RADS-3.    Assessment    Benign breast exam, tolerating antiestrogen therapy well.    Plan    The patient has been asked to return to the office in one year with a bilateral diagnostic mammogram.      PCP:  Philemon Kingdom 06/03/2014, 2:23 PM

## 2014-06-02 NOTE — Patient Instructions (Signed)
The patient has been asked to return to the office in one year with a bilateral diagnostic mammogram. 

## 2014-06-03 ENCOUNTER — Encounter: Payer: Self-pay | Admitting: General Surgery

## 2014-06-03 DIAGNOSIS — D0510 Intraductal carcinoma in situ of unspecified breast: Secondary | ICD-10-CM | POA: Insufficient documentation

## 2014-09-14 LAB — HEMOGLOBIN A1C: HEMOGLOBIN A1C: 10.5 % — AB (ref 4.0–6.0)

## 2014-10-27 NOTE — Op Note (Signed)
PATIENT NAME:  Michele [name], Michele [name] MR#:  [id number] DATE OF BIRTH:  04-Nov-1955  DATE OF PROCEDURE:  03/21/2012  PREOPERATIVE DIAGNOSIS: Chronic cholecystitis and cholelithiasis.   POSTOPERATIVE DIAGNOSES: Chronic cholecystitis and cholelithiasis with epigastric hernia.   OPERATING SURGEON: Robert Bellow, MD  ANESTHESIA: General endotracheal under Dr. Ronelle Nigh.   ESTIMATED BLOOD LOSS: Less than 5 mL.   CLINICAL NOTE: This 59 year old woman had had episodic abdominal pain and recent ultrasound showed evidence of a contracted gallbladder and chronic cholecystitis. She was felt to be a candidate for elective cholecystectomy. Clinical exam suggested a fullness at the umbilicus possibly representing an umbilical hernia. She received Kefzol prior to the procedure due to her history of diabetes. Knee high TED stockings and pneumatic compression stockings were used for deep vein thrombosis prophylaxis.   OPERATIVE NOTE: With the patient under adequate general endotracheal anesthesia, the abdomen was prepped with ChloraPrep and draped. The patient had a Veress needle placed about 3 cm above the umbilicus. After assuring intra-abdominal location with the hanging drop test, the abdomen was insufflated with CO2 at 12 mmHg pressure. A 10 mm step port was expanded and found to be within a pedicle of omentum. This was freed and the patient placed into reverse Trendelenburg position and rolled to the left. An 11 mm Xcel port was placed into the epigastrium and two 5 mm step ports placed laterally. Moving the camera to the epigastric port site showed that there was a pedicle of fat trapped in an epigastric hernia which the port had come through. The port was manipulated until it was free. Attention was turned to the gallbladder. Acute inflammation was not evident. It was placed on cephalad traction. There is evidence of hepatic steatosis with marked rounding of the liver edge. Due to the generous layer of  intra-abdominal adipose tissue a fifth port was placed in the anterior axillary line and a Peer retractor was used to push the duodenum inferiorly and medially. The neck of the gallbladder was cleared. Moderate inflammatory changes were seen at this level as well as an enlarged cystic duct lymph node that was removed to provide better visualization of the neck of the gallbladder. The cystic duct was cleared. A Kumar clamp was placed. Fluoroscopic cholangiograms were completed using 20 mL of one-half strength Conray 60. This showed prompt filling of the right and left hepatic ducts and free flow into the duodenum. The cystic duct and branches of the cystic artery were doubly clipped and divided. The gallbladder was then removed from the liver bed making use of hook cautery dissection. The gallbladder was placed in an Endo Catch bag and then delivered to the supraumbilical port site. It was necessary to expand the fascial incision to extract the multiple 1 cm stones as well as a large 2.5 cm stone in the fundus of the gallbladder. After re-establishing pneumoperitoneum, the right upper quadrant was irrigated with lactated Ringer's solution. Good hemostasis was appreciated. The abdomen was desufflated and ports removed under direct vision. The fascial defect at the epigastric area was repaired with interrupted 0 PDS figure-of-eight sutures. Skin incisions were closed with 4-0 Vicryl subcuticular sutures. Benzoin, Steri-Strips, Telfa, and Tegaderm dressings were then applied.   Patient tolerated procedure well and was taken to recovery room in stable condition.   ____________________________ Robert Bellow, MD jwb:cms D: 03/21/2012 11:20:16 ET T: 03/21/2012 11:37:48 ET JOB#: [id number]  cc: Robert Bellow, MD, <Dictator> Richard L. Rosanna Randy, MD Gay Moncivais Amedeo Kinsman MD ELECTRONICALLY SIGNED 03/21/2012 15:01

## 2014-11-28 DIAGNOSIS — E039 Hypothyroidism, unspecified: Secondary | ICD-10-CM | POA: Insufficient documentation

## 2014-11-28 DIAGNOSIS — C50919 Malignant neoplasm of unspecified site of unspecified female breast: Secondary | ICD-10-CM | POA: Insufficient documentation

## 2014-11-28 DIAGNOSIS — J309 Allergic rhinitis, unspecified: Secondary | ICD-10-CM | POA: Insufficient documentation

## 2014-11-28 DIAGNOSIS — K219 Gastro-esophageal reflux disease without esophagitis: Secondary | ICD-10-CM | POA: Insufficient documentation

## 2014-12-30 ENCOUNTER — Ambulatory Visit (INDEPENDENT_AMBULATORY_CARE_PROVIDER_SITE_OTHER): Payer: 59 | Admitting: Family Medicine

## 2014-12-30 ENCOUNTER — Encounter: Payer: Self-pay | Admitting: Family Medicine

## 2014-12-30 VITALS — BP 120/70 | HR 80 | Temp 98.9°F | Resp 16 | Ht 63.0 in | Wt 233.0 lb

## 2014-12-30 DIAGNOSIS — I2510 Atherosclerotic heart disease of native coronary artery without angina pectoris: Secondary | ICD-10-CM | POA: Diagnosis not present

## 2014-12-30 DIAGNOSIS — E039 Hypothyroidism, unspecified: Secondary | ICD-10-CM

## 2014-12-30 DIAGNOSIS — E785 Hyperlipidemia, unspecified: Secondary | ICD-10-CM

## 2014-12-30 DIAGNOSIS — I10 Essential (primary) hypertension: Secondary | ICD-10-CM | POA: Diagnosis not present

## 2014-12-30 DIAGNOSIS — C50919 Malignant neoplasm of unspecified site of unspecified female breast: Secondary | ICD-10-CM

## 2014-12-30 DIAGNOSIS — E119 Type 2 diabetes mellitus without complications: Secondary | ICD-10-CM

## 2014-12-30 LAB — POCT GLYCOSYLATED HEMOGLOBIN (HGB A1C): HEMOGLOBIN A1C: 9.6

## 2014-12-30 MED ORDER — NEBIVOLOL HCL 20 MG PO TABS: 20.0000 mg | ORAL_TABLET | Freq: Every day | ORAL | Status: DC

## 2014-12-30 NOTE — Progress Notes (Signed)
Subjective:    Patient ID: Michele [name], female    DOB: May 21, 1956, 59 y.o.   MRN: [id number]  Diabetes She presents for her follow-up diabetic visit. She has type 2 diabetes mellitus. There are no hypoglycemic associated symptoms. Pertinent negatives for hypoglycemia include no headaches or sweats. Associated symptoms include visual change (dry eyes). Pertinent negatives for diabetes include no blurred vision, no chest pain, no fatigue, no foot paresthesias, no foot ulcerations, no polydipsia, no polyphagia, no polyuria and no weakness. Current diabetic treatment includes oral agent (dual therapy). She is compliant with treatment all of the time. Her weight is stable. She is following a generally healthy diet. She participates in exercise daily (stays active with garden). Home blood sugar record trend: has not checked blood sugar in a few months. She does not see a podiatrist.Eye exam is current.  Hypertension This is a recurrent problem. The problem is unchanged. The problem is controlled. Associated symptoms include palpitations (on treatment ). Pertinent negatives include no anxiety, blurred vision, chest pain, headaches, malaise/fatigue, peripheral edema, shortness of breath or sweats. Risk factors for coronary artery disease include diabetes mellitus. The current treatment provides significant improvement. There are no compliance problems.       Review of Systems  Constitutional: Negative.  Negative for malaise/fatigue and fatigue.  Eyes: Negative for blurred vision.  Respiratory: Negative for shortness of breath.   Cardiovascular: Positive for palpitations (on treatment ). Negative for chest pain.  Endocrine: Negative for polydipsia, polyphagia and polyuria.  Neurological: Negative for weakness and headaches.  Psychiatric/Behavioral: Negative.    Patient Active Problem List   Diagnosis Date Noted  . Allergic rhinitis 11/28/2014  . Breast CA 11/28/2014  . Acid reflux 11/28/2014   . Adult hypothyroidism 11/28/2014  . DCIS (ductal carcinoma in situ) 06/03/2014  . Diabetes mellitus 03/14/2012  . Shortness of breath 12/13/2011  . CAD (coronary artery disease) 12/13/2011  . Obesity 12/13/2011  . Hyperlipidemia 12/13/2011  . Hypertension 12/13/2011   Past Medical History  Diagnosis Date  . Hypertension   . CHF (congestive heart failure)   . Ischemic cardiomyopathy   . Diabetes mellitus     Type II  . Hyperlipidemia   . Coronary artery disease 2009  . Thyroid disease     hypothyroidism  . DCIS (ductal carcinoma in situ) of breast, right 2012  . GERD (gastroesophageal reflux disease)   . Sleep apnea   . Abscess of breast, right 08/19/2012  . Umbilical hernia without mention of obstruction or gangrene   . Obesity, unspecified   . Breast cancer July 13, 2010    intermediate grade DCIS, nuclear grade 2, ER 90%, PR 90% treated with wide excision, reexcision to negative margins and MammoSite partial breast radiation.   Current Outpatient Prescriptions on File Prior to Visit  Medication Sig  . allopurinol (ZYLOPRIM) 300 MG tablet   . amLODipine-valsartan (EXFORGE) 10-320 MG per tablet Take 1 tablet by mouth daily.  . fluticasone (FLONASE) 50 MCG/ACT nasal spray Place into the nose.  . levothyroxine (SYNTHROID, LEVOTHROID) 200 MCG tablet Take by mouth.  . naproxen (NAPROSYN) 500 MG tablet Take by mouth.  . NON FORMULARY CPAP AT BEDTIME  . sitaGLIPtin-metformin (JANUMET) 50-1000 MG per tablet Take by mouth.  . tamoxifen (NOLVADEX) 20 MG tablet Take 1 tablet (20 mg total) by mouth daily.   No current facility-administered medications on file prior to visit.   No Known Allergies Past Surgical History  Procedure Laterality Date  . Cardiac  catheterization  01/08/2008  . Incision and drainage breast abscess Right 08/19/2012  . Tonsillectomy and adenoidectomy      age 60 yrs  . Hernia repair  2013    epigastric  . Cholecystectomy  2013  . [facility]  . Breast mass excision Right 2011    December  . Breast lumpectomy Right Jan 2012    Wide excision  . Breast biopsy Right 2007   History   Social History  . Marital Status: Married    Spouse Name: Ludwig Clarks  . Number of Children: 0  . Years of Education: N/A   Occupational History  . retired [employer]   Social History Main Topics  . Smoking status: Never Smoker   . Smokeless tobacco: Never Used  . Alcohol Use: No  . Drug Use: No  . Sexual Activity: Not on file   Other Topics Concern  . Not on file   Social History Narrative   Family History  Problem Relation Age of Onset  . Heart failure Mother   . Diabetes Mother   . Congestive Heart Failure Mother   . CVA Mother   . Heart attack Father   . Arthritis Father   . Diabetes Sister   . Hypertension Sister   . Lung cancer Sister   . Hypertension Brother   . Stroke Maternal Grandmother   . Heart attack Maternal Grandfather   . Heart attack Paternal Grandmother   . Hypertension Brother   . Hemochromatosis Brother        Objective:   Physical Exam  Constitutional: She appears well-developed and well-nourished.  Cardiovascular: Normal rate and regular rhythm.   Pulmonary/Chest: Effort normal and breath sounds normal.  Abdominal: Soft. Bowel sounds are normal.  Psychiatric: She has a normal mood and affect. Her behavior is normal. Judgment and thought content normal.   Blood pressure 120/70, pulse 80, temperature 98.9 F (37.2 C), temperature source Oral, resp. rate 16, height 5\' 3"  (1.6 m), weight 233 lb (105.688 kg), SpO2 96 %.      Assessment & Plan:   1. Type 2 diabetes mellitus without complication Stable. A1C at 8.4%. Patient advised to continue working on healthy diet and exercise daily. Patient advised to continue current medication and monitor blood sugar daily. Patient to F/U in 4 months. - POCT glycosylated hemoglobin (Hb A1C)  2. Essential hypertension Stable. Patient advised to continue  current treatment and plan. - Comprehensive metabolic panel  3. Hypothyroidism, unspecified hypothyroidism type Stable. Patient advised to continue current treatment and plan. - TSH  4. Hyperlipidemia - Lipid Panel With LDL/HDL Ratio  5. Breast CA, unspecified laterality  6. Morbid obesity  7. Coronary artery disease involving native coronary artery of native heart without angina pectoris Stable. Patient advised to continue current treatment and plan. - Nebivolol HCl (BYSTOLIC) 20 MG TABS; Take 1 tablet (20 mg total) by mouth daily.  Dispense: 90 tablet; Refill: 3  8. Iron overload F/U pending lab report. - CBC with Differential/Platelet - Iron Binding Cap (TIBC) 9. History of CHF

## 2015-01-02 LAB — LIPID PANEL WITH LDL/HDL RATIO
CHOLESTEROL TOTAL: 167 mg/dL (ref 100–199)
HDL: 27 mg/dL — ABNORMAL LOW (ref >39)
LDL Calculated: 65 mg/dL (ref 0–99)
LDL/HDL RATIO: 2.4 ratio (ref 0.0–3.2)
Triglycerides: 376 mg/dL — ABNORMAL HIGH (ref 0–149)
VLDL CHOLESTEROL CAL: 75 mg/dL — AB (ref 5–40)

## 2015-01-02 LAB — CBC WITH DIFFERENTIAL/PLATELET
Basophils Absolute: 0.1 10*3/uL (ref 0.0–0.2)
Basos: 1 %
EOS (ABSOLUTE): 0.3 10*3/uL (ref 0.0–0.4)
Eos: 2 %
HEMOGLOBIN: 13.9 g/dL (ref 11.1–15.9)
Hematocrit: 41.1 % (ref 34.0–46.6)
IMMATURE GRANS (ABS): 0 10*3/uL (ref 0.0–0.1)
Immature Granulocytes: 0 %
Lymphocytes Absolute: 5.4 10*3/uL — ABNORMAL HIGH (ref 0.7–3.1)
Lymphs: 45 %
MCH: 31.7 pg (ref 26.6–33.0)
MCHC: 33.8 g/dL (ref 31.5–35.7)
MCV: 94 fL (ref 79–97)
MONOS ABS: 0.8 10*3/uL (ref 0.1–0.9)
Monocytes: 6 %
NEUTROS PCT: 46 %
Neutrophils Absolute: 5.4 10*3/uL (ref 1.4–7.0)
Platelets: 283 10*3/uL (ref 150–379)
RBC: 4.39 x10E6/uL (ref 3.77–5.28)
RDW: 14 % (ref 12.3–15.4)
WBC: 11.9 10*3/uL — AB (ref 3.4–10.8)

## 2015-01-02 LAB — COMPREHENSIVE METABOLIC PANEL
ALT: 24 IU/L (ref 0–32)
AST: 36 IU/L (ref 0–40)
Albumin/Globulin Ratio: 1.6 (ref 1.1–2.5)
Albumin: 4.3 g/dL (ref 3.5–5.5)
Alkaline Phosphatase: 47 IU/L (ref 39–117)
BILIRUBIN TOTAL: 0.6 mg/dL (ref 0.0–1.2)
BUN/Creatinine Ratio: 23 (ref 9–23)
BUN: 16 mg/dL (ref 6–24)
CHLORIDE: 99 mmol/L (ref 97–108)
CO2: 18 mmol/L (ref 18–29)
CREATININE: 0.71 mg/dL (ref 0.57–1.00)
Calcium: 9.6 mg/dL (ref 8.7–10.2)
GFR calc Af Amer: 108 mL/min/{1.73_m2} (ref >59)
GFR, EST NON AFRICAN AMERICAN: 94 mL/min/{1.73_m2} (ref >59)
Globulin, Total: 2.7 g/dL (ref 1.5–4.5)
Glucose: 270 mg/dL — ABNORMAL HIGH (ref 65–99)
Potassium: 4.2 mmol/L (ref 3.5–5.2)
SODIUM: 137 mmol/L (ref 134–144)
TOTAL PROTEIN: 7 g/dL (ref 6.0–8.5)

## 2015-01-02 LAB — IRON AND TIBC
Iron Saturation: 36 % (ref 15–55)
Iron: 126 ug/dL (ref 27–159)
Total Iron Binding Capacity: 347 ug/dL (ref 250–450)
UIBC: 221 ug/dL (ref 131–425)

## 2015-01-02 LAB — FERRITIN: Ferritin: 414 ng/mL — ABNORMAL HIGH (ref 15–150)

## 2015-01-02 LAB — TSH: TSH: 35.98 u[IU]/mL — ABNORMAL HIGH (ref 0.450–4.500)

## 2015-01-04 ENCOUNTER — Telehealth: Payer: Self-pay

## 2015-01-04 DIAGNOSIS — E038 Other specified hypothyroidism: Secondary | ICD-10-CM

## 2015-01-04 NOTE — Telephone Encounter (Signed)
-----   Message from Margarita Rana, MD sent at 01/04/2015  2:33 PM EDT ----- Thyroid is very low in light of dose of Levothyroxine. Please clarify if taking 200 mcg in am fasting.  May need to adjust dose. Thanks.

## 2015-01-04 NOTE — Telephone Encounter (Signed)
Spoke with patient and advised of below. She is taking medication at 200 mcg am fasting. She states she missed 2 days about 2 weeks ago due to been on vacation but other than that has taking it daily. Please review. Thank you-aa

## 2015-01-04 NOTE — Telephone Encounter (Signed)
Would recheck in two weeks before adding medication. Recheck TSH And  T4 and also  Cbc secondary to mildly elevated wbc. Thanks.

## 2015-01-04 NOTE — Telephone Encounter (Signed)
Pt advised, pt states WBC is always up some. Per Dr. Venia Minks just re check TSH and T4 then. Thanks-aa

## 2015-01-16 LAB — TSH+FREE T4
Free T4: 1.5 ng/dL (ref 0.82–1.77)
TSH: 3.4 u[IU]/mL (ref 0.450–4.500)

## 2015-01-18 ENCOUNTER — Telehealth: Payer: Self-pay

## 2015-01-18 NOTE — Telephone Encounter (Signed)
-----   Message from Margarita Rana, MD sent at 01/16/2015  6:24 AM EDT ----- TSH now normal.  Please see how patient is doing. Thank.

## 2015-01-18 NOTE — Telephone Encounter (Signed)
Pt returning call.  HF#[phone]/CE

## 2015-01-18 NOTE — Telephone Encounter (Signed)
LMTCB 01/18/2015  Thanks,   -Mickel Baas

## 2015-01-18 NOTE — Telephone Encounter (Signed)
Pt advised.  She is doing well.   Thanks,   -Mickel Baas

## 2015-04-01 ENCOUNTER — Other Ambulatory Visit: Payer: Self-pay

## 2015-04-01 DIAGNOSIS — D0511 Intraductal carcinoma in situ of right breast: Secondary | ICD-10-CM

## 2015-04-28 ENCOUNTER — Encounter: Payer: 59 | Admitting: Family Medicine

## 2015-05-28 ENCOUNTER — Other Ambulatory Visit: Payer: Self-pay

## 2015-05-28 ENCOUNTER — Ambulatory Visit: Payer: Self-pay

## 2015-06-01 ENCOUNTER — Ambulatory Visit (INDEPENDENT_AMBULATORY_CARE_PROVIDER_SITE_OTHER): Payer: 59 | Admitting: Family Medicine

## 2015-06-01 VITALS — BP 140/76 | HR 64 | Temp 98.9°F | Resp 16 | Ht 64.0 in | Wt 224.0 lb

## 2015-06-01 DIAGNOSIS — M150 Primary generalized (osteo)arthritis: Secondary | ICD-10-CM

## 2015-06-01 DIAGNOSIS — G2581 Restless legs syndrome: Secondary | ICD-10-CM | POA: Diagnosis not present

## 2015-06-01 DIAGNOSIS — E119 Type 2 diabetes mellitus without complications: Secondary | ICD-10-CM

## 2015-06-01 DIAGNOSIS — M159 Polyosteoarthritis, unspecified: Secondary | ICD-10-CM

## 2015-06-01 DIAGNOSIS — Z0000 Encounter for general adult medical examination without abnormal findings: Secondary | ICD-10-CM | POA: Diagnosis not present

## 2015-06-01 MED ORDER — TRAZODONE HCL 100 MG PO TABS: 100.0000 mg | ORAL_TABLET | Freq: Every day | ORAL | Status: DC

## 2015-06-01 NOTE — Progress Notes (Signed)
Patient ID: Michele [name], female   DOB: 1955-12-15, 59 y.o.   MRN: [id number] Patient: Michele [name], Female    DOB: 1956/05/19, 59 y.o.   MRN: [id number] Visit Date: 06/01/2015  Today's Provider: [name], MD   Chief Complaint  Patient presents with  . Annual Exam   Subjective:  Michele [name] is a 59 y.o. female who presents today for health maintenance and complete physical. She feels fairly well. She reports exercising occasionally. She reports she is sleeping poorly.   Review of Systems  Constitutional: Negative.   HENT: Positive for sinus pressure. Negative for congestion, dental problem, drooling, ear discharge, ear pain, facial swelling, hearing loss, mouth sores, nosebleeds, postnasal drip, rhinorrhea, sneezing, sore throat, tinnitus, trouble swallowing and voice change.   Eyes: Negative for photophobia, pain, discharge, redness, itching and visual disturbance.  Respiratory: Negative for apnea, cough, choking, chest tightness, shortness of breath, wheezing and stridor.   Cardiovascular: Positive for palpitations. Negative for chest pain and leg swelling.  Gastrointestinal: Negative for nausea, vomiting, abdominal pain, diarrhea, constipation, blood in stool, abdominal distention, anal bleeding and rectal pain.  Endocrine: Negative for cold intolerance, heat intolerance, polydipsia, polyphagia and polyuria.  Genitourinary: Negative for dysuria, urgency, frequency, hematuria, flank pain, decreased urine volume, vaginal bleeding, vaginal discharge, enuresis, difficulty urinating, genital sores, vaginal pain, menstrual problem, pelvic pain and dyspareunia.  Musculoskeletal: Positive for arthralgias. Negative for myalgias, back pain, joint swelling, gait problem, neck pain and neck stiffness.  Skin: Negative for color change, pallor, rash and wound.  Allergic/Immunologic: Negative for environmental allergies, food allergies and immunocompromised state.   Neurological: Negative for dizziness, tremors, seizures, syncope, facial asymmetry, speech difficulty, weakness, light-headedness, numbness and headaches.  Hematological: Negative for adenopathy. Does not bruise/bleed easily.  Psychiatric/Behavioral: Negative for suicidal ideas, hallucinations, behavioral problems, confusion, sleep disturbance, self-injury, dysphoric mood, decreased concentration and agitation. The patient is not nervous/anxious and is not hyperactive.     Social History   Social History  . Marital Status: Married    Spouse Name: Ludwig Clarks  . Number of Children: 0  . Years of Education: N/A   Occupational History  . retired [employer]   Social History Main Topics  . Smoking status: Never Smoker   . Smokeless tobacco: Never Used  . Alcohol Use: No  . Drug Use: No  . Sexual Activity: Not on file   Other Topics Concern  . Not on file   Social History Narrative    Patient Active Problem List   Diagnosis Date Noted  . Allergic rhinitis 11/28/2014  . Breast CA ([place]) 11/28/2014  . Acid reflux 11/28/2014  . Adult hypothyroidism 11/28/2014  . DCIS (ductal carcinoma in situ) 06/03/2014  . Diabetes mellitus ([place]) 03/14/2012  . Shortness of breath 12/13/2011  . CAD (coronary artery disease) 12/13/2011  . Obesity 12/13/2011  . Hyperlipidemia 12/13/2011  . Hypertension 12/13/2011    Past Surgical History  Procedure Laterality Date  . Cardiac catheterization  01/08/2008  . Incision and drainage breast abscess Right 08/19/2012  . Tonsillectomy and adenoidectomy      age 50 yrs  . Hernia repair  2013    epigastric  . Cholecystectomy  2013  . [facility]  . Breast mass excision Right 2011    December  . Breast lumpectomy Right Jan 2012    Wide excision  . Breast biopsy Right 2007    Her family history includes Arthritis in her father;  CVA in her mother; Congestive Heart Failure in her mother; Diabetes in her mother and sister; Heart attack  in her father, maternal grandfather, and paternal grandmother; Heart failure in her mother; Hemochromatosis in her brother; Hypertension in her brother, brother, and sister; Lung cancer in her sister; Stroke in her maternal grandmother.    Outpatient Prescriptions Prior to Visit  Medication Sig Dispense Refill  . allopurinol (ZYLOPRIM) 300 MG tablet     . amLODipine-valsartan (EXFORGE) 10-320 MG per tablet Take 1 tablet by mouth daily.    . fluticasone (FLONASE) 50 MCG/ACT nasal spray Place into the nose.    . levothyroxine (SYNTHROID, LEVOTHROID) 200 MCG tablet Take by mouth.    . Nebivolol HCl (BYSTOLIC) 20 MG TABS Take 1 tablet (20 mg total) by mouth daily. 90 tablet 3  . NON FORMULARY CPAP AT BEDTIME    . sitaGLIPtin-metformin (JANUMET) 50-1000 MG per tablet Take by mouth.    . tamoxifen (NOLVADEX) 20 MG tablet Take 1 tablet (20 mg total) by mouth daily. 90 tablet 4  . naproxen (NAPROSYN) 500 MG tablet Take by mouth.    . SYNTHROID 25 MCG tablet      No facility-administered medications prior to visit.    Patient Care Team: Jerrol Banana., MD as PCP - General (Unknown Physician Specialty) Robert Bellow, MD as Consulting Physician (General Surgery) Jerrol Banana., MD (Family Medicine)     Objective:   Vitals:  Filed Vitals:   06/01/15 1412  BP: 140/76  Pulse: 64  Temp: 98.9 F (37.2 C)  TempSrc: Oral  Resp: 16  Height: 5\' 4"  (1.626 m)  Weight: 224 lb (101.606 kg)    Physical Exam  Constitutional: She is oriented to person, place, and time. She appears well-developed and well-nourished.  HENT:  Head: Normocephalic and atraumatic.  Right Ear: External ear normal.  Left Ear: External ear normal.  Nose: Nose normal.  Mouth/Throat: Oropharynx is clear and moist.  Eyes: Conjunctivae and EOM are normal. Pupils are equal, round, and reactive to light.  Neck: Normal range of motion. Neck supple.  Cardiovascular: Normal rate, regular rhythm, normal heart  sounds and intact distal pulses.   Pulmonary/Chest: Effort normal and breath sounds normal.  Abdominal: Soft. Bowel sounds are normal.  Musculoskeletal: Normal range of motion.  Neurological: She is alert and oriented to person, place, and time.  Skin: Skin is warm and dry.  Psychiatric: She has a normal mood and affect. Her behavior is normal. Judgment and thought content normal.     Depression Screen No flowsheet data found.    Assessment & Plan:     Routine Health Maintenance and Physical Exam  Exercise Activities and Dietary recommendations Goals    . Exercise 150 minutes per week (moderate activity)    . Peak Blood Glucose < 180       Immunization History  Administered Date(s) Administered  . Pneumococcal Polysaccharide-23 05/13/2009  . Tdap 05/13/2009    Health Maintenance  Topic Date Due  . Hepatitis C Screening  06/07/1956  . FOOT EXAM  08/17/1965  . OPHTHALMOLOGY EXAM  08/17/1965  . URINE MICROALBUMIN  08/17/1965  . HIV Screening  08/17/1970  . COLONOSCOPY  08/17/2005  . PAP SMEAR  04/11/2013  . PNEUMOCOCCAL POLYSACCHARIDE VACCINE (2) 05/13/2014  . INFLUENZA VACCINE  02/08/2015  . HEMOGLOBIN A1C  07/01/2015  . MAMMOGRAM  05/26/2016  . TETANUS/TDAP  05/14/2019      Discussed health benefits of physical activity, and  encouraged her to engage in regular exercise appropriate for her age and condition.   RLS New problem at night in recent months.Due to chronic insomnia will try Trazedone 100mg  a bedtime. CAD DCIS of Breast TIIDM Morbid Obesity RTC 1-2 months. I have done the exam and reviewed the above chart and it is accurate to the best of my knowledge.  ------------------------------------------------------------------------------------------------------------

## 2015-06-04 LAB — COMPREHENSIVE METABOLIC PANEL
ALBUMIN: 4.4 g/dL (ref 3.5–5.5)
ALT: 15 IU/L (ref 0–32)
AST: 19 IU/L (ref 0–40)
Albumin/Globulin Ratio: 1.5 (ref 1.1–2.5)
Alkaline Phosphatase: 45 IU/L (ref 39–117)
BUN/Creatinine Ratio: 24 — ABNORMAL HIGH (ref 9–23)
BUN: 19 mg/dL (ref 6–24)
Bilirubin Total: 0.6 mg/dL (ref 0.0–1.2)
CALCIUM: 9 mg/dL (ref 8.7–10.2)
CHLORIDE: 99 mmol/L (ref 97–106)
CO2: 20 mmol/L (ref 18–29)
CREATININE: 0.79 mg/dL (ref 0.57–1.00)
GFR calc Af Amer: 95 mL/min/{1.73_m2} (ref >59)
GFR calc non Af Amer: 82 mL/min/{1.73_m2} (ref >59)
GLUCOSE: 280 mg/dL — AB (ref 65–99)
Globulin, Total: 2.9 g/dL (ref 1.5–4.5)
Potassium: 4.1 mmol/L (ref 3.5–5.2)
Sodium: 139 mmol/L (ref 136–144)
TOTAL PROTEIN: 7.3 g/dL (ref 6.0–8.5)

## 2015-06-04 LAB — HEMOGLOBIN A1C
ESTIMATED AVERAGE GLUCOSE: 272 mg/dL
Hgb A1c MFr Bld: 11.1 % — ABNORMAL HIGH (ref 4.8–5.6)

## 2015-06-04 LAB — LIPID PANEL WITH LDL/HDL RATIO
CHOLESTEROL TOTAL: 173 mg/dL (ref 100–199)
HDL: 25 mg/dL — AB (ref >39)
LDL CALC: 68 mg/dL (ref 0–99)
LDl/HDL Ratio: 2.7 ratio units (ref 0.0–3.2)
TRIGLYCERIDES: 398 mg/dL — AB (ref 0–149)
VLDL Cholesterol Cal: 80 mg/dL — ABNORMAL HIGH (ref 5–40)

## 2015-06-04 LAB — CBC WITH DIFFERENTIAL/PLATELET
BASOS ABS: 0.1 10*3/uL (ref 0.0–0.2)
Basos: 1 %
EOS (ABSOLUTE): 0.2 10*3/uL (ref 0.0–0.4)
Eos: 2 %
HEMATOCRIT: 45.6 % (ref 34.0–46.6)
HEMOGLOBIN: 14.7 g/dL (ref 11.1–15.9)
IMMATURE GRANS (ABS): 0 10*3/uL (ref 0.0–0.1)
Immature Granulocytes: 0 %
LYMPHS: 40 %
Lymphocytes Absolute: 4.9 10*3/uL — ABNORMAL HIGH (ref 0.7–3.1)
MCH: 33.3 pg — ABNORMAL HIGH (ref 26.6–33.0)
MCHC: 32.2 g/dL (ref 31.5–35.7)
MCV: 103 fL — ABNORMAL HIGH (ref 79–97)
MONOCYTES: 5 %
Monocytes Absolute: 0.6 10*3/uL (ref 0.1–0.9)
Neutrophils Absolute: 6.4 10*3/uL (ref 1.4–7.0)
Neutrophils: 52 %
Platelets: 317 10*3/uL (ref 150–379)
RBC: 4.42 x10E6/uL (ref 3.77–5.28)
RDW: 13.5 % (ref 12.3–15.4)
WBC: 12.2 10*3/uL — AB (ref 3.4–10.8)

## 2015-06-04 LAB — TSH: TSH: 40.22 u[IU]/mL — ABNORMAL HIGH (ref 0.450–4.500)

## 2015-06-08 ENCOUNTER — Ambulatory Visit: Payer: Self-pay | Admitting: General Surgery

## 2015-06-09 ENCOUNTER — Ambulatory Visit: Payer: Self-pay | Admitting: General Surgery

## 2015-06-17 ENCOUNTER — Other Ambulatory Visit: Payer: Self-pay | Admitting: Family Medicine

## 2015-06-17 DIAGNOSIS — E039 Hypothyroidism, unspecified: Secondary | ICD-10-CM

## 2015-06-17 NOTE — Telephone Encounter (Signed)
Pt contacted office for refill request on the following medications:  levothyroxine (SYNTHROID, LEVOTHROID) 200 MCG tablet.  90 day supply.  [facility] mail order.  CB#[phone]/MW

## 2015-06-18 MED ORDER — LEVOTHYROXINE SODIUM 200 MCG PO TABS: 200.0000 ug | ORAL_TABLET | Freq: Every day | ORAL | Status: DC

## 2015-06-18 NOTE — Telephone Encounter (Signed)
RX sent in-aa 

## 2015-06-18 NOTE — Telephone Encounter (Signed)
Patient requesting refill. 

## 2015-06-23 ENCOUNTER — Other Ambulatory Visit: Payer: Self-pay | Admitting: General Surgery

## 2015-06-23 ENCOUNTER — Ambulatory Visit
Admission: RE | Admit: 2015-06-23 | Discharge: 2015-06-23 | Disposition: A | Discharge status: Home / Self Care | Payer: 59 | Source: Ambulatory Visit | Attending: General Surgery | Admitting: General Surgery

## 2015-06-23 DIAGNOSIS — D0511 Intraductal carcinoma in situ of right breast: Secondary | ICD-10-CM

## 2015-06-23 DIAGNOSIS — N63 Unspecified lump in breast: Secondary | ICD-10-CM | POA: Diagnosis not present

## 2015-06-23 DIAGNOSIS — Z853 Personal history of malignant neoplasm of breast: Secondary | ICD-10-CM | POA: Diagnosis not present

## 2015-06-29 ENCOUNTER — Encounter: Payer: Self-pay | Admitting: General Surgery

## 2015-06-29 ENCOUNTER — Ambulatory Visit (INDEPENDENT_AMBULATORY_CARE_PROVIDER_SITE_OTHER): Payer: 59 | Admitting: General Surgery

## 2015-06-29 VITALS — BP 140/78 | HR 74 | Resp 14 | Ht 64.0 in | Wt 222.0 lb

## 2015-06-29 DIAGNOSIS — D0511 Intraductal carcinoma in situ of right breast: Secondary | ICD-10-CM | POA: Diagnosis not present

## 2015-06-29 MED ORDER — TAMOXIFEN CITRATE 20 MG PO TABS: 20.0000 mg | ORAL_TABLET | Freq: Every day | ORAL | Status: DC

## 2015-06-29 NOTE — Patient Instructions (Addendum)
Patient to return in 6 months right breast ultrasound.

## 2015-06-29 NOTE — Progress Notes (Signed)
Patient ID: Michele [name], female   DOB: 11-Nov-1955, 59 y.o.   MRN: [id number]  Chief Complaint  Patient presents with  . Follow-up    mammogram    HPI Michele [name] is a 59 y.o. female. who presents for a breast evaluation. The most recent mammogram and right breast ultrasound was done on 06/23/15.  Patient does perform regular self breast checks and gets regular mammograms done.    HPI  Past Medical History  Diagnosis Date  . Hypertension   . CHF (congestive heart failure) ([place])   . Ischemic cardiomyopathy   . Diabetes mellitus ([place])     Type II  . Hyperlipidemia   . Coronary artery disease 2009  . Thyroid disease     hypothyroidism  . DCIS (ductal carcinoma in situ) of breast, right 2012  . GERD (gastroesophageal reflux disease)   . Sleep apnea   . Abscess of breast, right 08/19/2012  . Umbilical hernia without mention of obstruction or gangrene   . Obesity, unspecified   . Breast cancer [facility]) July 13, 2010    intermediate grade DCIS, nuclear grade 2, ER 90%, PR 90% treated with wide excision, reexcision to negative margins and MammoSite partial breast radiation.    Past Surgical History  Procedure Laterality Date  . Cardiac catheterization  01/08/2008  . Incision and drainage breast abscess Right 08/19/2012  . Tonsillectomy and adenoidectomy      age 73 yrs  . Hernia repair  2013    epigastric  . Cholecystectomy  2013  . [facility]  . Breast mass excision Right 2011    December  . Breast lumpectomy Right Jan 2012    Wide excision  . Breast biopsy Right 2007    DCIS mammosite     Family History  Problem Relation Age of Onset  . Heart failure Mother   . Diabetes Mother   . Congestive Heart Failure Mother   . CVA Mother   . Heart attack Father   . Arthritis Father   . Diabetes Sister   . Hypertension Sister   . Lung cancer Sister   . Hypertension Brother   . Stroke Maternal Grandmother   . Heart attack Maternal  Grandfather   . Heart attack Paternal Grandmother   . Hypertension Brother   . Hemochromatosis Brother     Social History Social History  Substance Use Topics  . Smoking status: Never Smoker   . Smokeless tobacco: Never Used  . Alcohol Use: No    No Known Allergies  Current Outpatient Prescriptions  Medication Sig Dispense Refill  . allopurinol (ZYLOPRIM) 300 MG tablet     . amLODipine-valsartan (EXFORGE) 10-320 MG per tablet Take 1 tablet by mouth daily.    . fluticasone (FLONASE) 50 MCG/ACT nasal spray Place into the nose.    . Lancets (ONETOUCH ULTRASOFT) lancets 2 (two) times daily. use for testing  12  . levothyroxine (SYNTHROID, LEVOTHROID) 200 MCG tablet Take 1 tablet (200 mcg total) by mouth daily before breakfast. 90 tablet 3  . meloxicam (MOBIC) 7.5 MG tablet TAKE 1 TABLET BY MOUTH 2 TIMES A DAY WITH A MEAL  1  . Nebivolol HCl (BYSTOLIC) 20 MG TABS Take 1 tablet (20 mg total) by mouth daily. 90 tablet 3  . NON FORMULARY CPAP AT BEDTIME    . ONE TOUCH ULTRA TEST test strip 2 (two) times daily. use for testing  12  . sitaGLIPtin-metformin (JANUMET) 50-1000  MG per tablet Take by mouth.    . tamoxifen (NOLVADEX) 20 MG tablet Take 1 tablet (20 mg total) by mouth daily. 90 tablet 3   No current facility-administered medications for this visit.    Review of Systems Review of Systems  Constitutional: Negative.   Respiratory: Negative.   Cardiovascular: Negative.     Blood pressure 140/78, pulse 74, resp. rate 14, height 5\' 4"  (1.626 m), weight 222 lb (100.699 kg).  Physical Exam Physical Exam  Constitutional: She is oriented to person, place, and time. She appears well-developed and well-nourished.  Eyes: Conjunctivae are normal. No scleral icterus.  Neck: Neck supple.  Cardiovascular: Normal rate, regular rhythm and normal heart sounds.   Pulmonary/Chest: Effort normal and breath sounds normal. Right breast exhibits no inverted nipple, no mass, no nipple discharge,  no skin change and no tenderness. Left breast exhibits no inverted nipple, no mass, no nipple discharge, no skin change and no tenderness.    Lymphadenopathy:    She has no cervical adenopathy.    She has no axillary adenopathy.  Neurological: She is alert and oriented to person, place, and time.  Skin: Skin is warm and dry.    Data Reviewed Mammograms of 06/23/2015 as well as the right breast ultrasound were independently reviewed and discussed with the radiologist by phone. Films from 2013-2016 were reviewed. BI-RADS-4.   The 2014 films were obtained proximally 7 months after drainage of an area of liquefied fat and other least impressive films in the series. The density in the site of the fat necrosis appears stable on my review between 2015 and 2016.  Assessment    Right breast DCIS with treatment complicated by fat necrosis postoperative year 1. Minimal interval change between 2015 and 2016.    Plan    Options for management were reviewed: 1) proceed with ultrasound-guided biopsy to confirm no evidence of recurrent malignancy versus 2) six-month follow-up exam with biopsy if progressive changes noted on ultrasound.  Pros and cons of each approach reviewed.  At this time, the patient desires a 6 month follow-up exam, but she is aware that she may call earlier if she changes her mind.    Patient to return in 6 months right breast ultrasound. Rx sent for her Tamoxifen #90 with 3 refills as requested.  PCP:  Rosanna Randy  This information has been scribed by Gaspar Cola CMA.   Robert Bellow 06/30/2015, 9:10 PM

## 2015-07-14 ENCOUNTER — Ambulatory Visit: Payer: 59 | Admitting: Family Medicine

## 2015-07-26 ENCOUNTER — Other Ambulatory Visit: Payer: Self-pay

## 2015-08-25 ENCOUNTER — Ambulatory Visit: Payer: 59 | Admitting: Family Medicine

## 2015-09-30 ENCOUNTER — Encounter: Payer: Self-pay | Admitting: *Deleted

## 2015-10-11 ENCOUNTER — Ambulatory Visit (INDEPENDENT_AMBULATORY_CARE_PROVIDER_SITE_OTHER): Payer: 59 | Admitting: Family Medicine

## 2015-10-11 VITALS — BP 160/70 | HR 68 | Temp 98.5°F | Resp 16 | Wt 215.0 lb

## 2015-10-11 DIAGNOSIS — E538 Deficiency of other specified B group vitamins: Secondary | ICD-10-CM | POA: Diagnosis not present

## 2015-10-11 DIAGNOSIS — D649 Anemia, unspecified: Secondary | ICD-10-CM

## 2015-10-11 DIAGNOSIS — E038 Other specified hypothyroidism: Secondary | ICD-10-CM

## 2015-10-11 DIAGNOSIS — E119 Type 2 diabetes mellitus without complications: Secondary | ICD-10-CM | POA: Diagnosis not present

## 2015-10-11 DIAGNOSIS — G2581 Restless legs syndrome: Secondary | ICD-10-CM | POA: Diagnosis not present

## 2015-10-11 NOTE — Progress Notes (Signed)
Patient ID: Michele [name], female   DOB: [birth year], 60 y.o.   MRN: [id number]   Michele [name]  MRN: [id number] DOB: 06/09/1956  Subjective:  HPI   1. Type 2 diabetes mellitus without complication, without long-term current use of insulin (HCC) The ;patioent is a 60 year old female who presents for follow up of her diabetes.  She was last seen on 06/01/15 and her A1C at that time was 11.1.  She had not been taking her medication regularly at that time so no changes were made.  She will need to have her A1C repeated today.  2. Other specified hypothyroidism Patient had abnormal TSH on her last lab draw.  She presents today for repeat.    3. Restless leg Patient was started on Trazodone.  Patient states she was not able to take the medication due to her feeling like she has a hang over the next day.  4. Anemia, unspecified anemia type Patient had some abnormal labs on her last lab draw and was told wee would need to check her B12 level today.   Patient Active Problem List   Diagnosis Date Noted  . Allergic rhinitis 11/28/2014  . Breast CA ([place]) 11/28/2014  . Acid reflux 11/28/2014  . Adult hypothyroidism 11/28/2014  . DCIS (ductal carcinoma in situ) 06/03/2014  . Diabetes mellitus ([place]) 03/14/2012  . Shortness of breath 12/13/2011  . CAD (coronary artery disease) 12/13/2011  . Obesity 12/13/2011  . Hyperlipidemia 12/13/2011  . Hypertension 12/13/2011    Past Medical History  Diagnosis Date  . Hypertension   . CHF (congestive heart failure) ([place])   . Ischemic cardiomyopathy   . Diabetes mellitus ([place])     Type II  . Hyperlipidemia   . Coronary artery disease 2009  . Thyroid disease     hypothyroidism  . DCIS (ductal carcinoma in situ) of breast, right 2012  . GERD (gastroesophageal reflux disease)   . Sleep apnea   . Abscess of breast, right 08/19/2012  . Umbilical hernia without mention of obstruction or gangrene   . Obesity, unspecified   . Breast cancer  [facility]) July 13, 2010    intermediate grade DCIS, nuclear grade 2, ER 90%, PR 90% treated with wide excision, reexcision to negative margins and MammoSite partial breast radiation.    Social History   Social History  . Marital Status: Married    Spouse Name: Ludwig Clarks  . Number of Children: 0  . Years of Education: N/A   Occupational History  . retired [employer]   Social History Main Topics  . Smoking status: Never Smoker   . Smokeless tobacco: Never Used  . Alcohol Use: No  . Drug Use: No  . Sexual Activity: Not on file   Other Topics Concern  . Not on file   Social History Narrative    Outpatient Prescriptions Prior to Visit  Medication Sig Dispense Refill  . allopurinol (ZYLOPRIM) 300 MG tablet     . amLODipine-valsartan (EXFORGE) 10-320 MG per tablet Take 1 tablet by mouth daily.    . fluticasone (FLONASE) 50 MCG/ACT nasal spray Place into the nose.    . Lancets (ONETOUCH ULTRASOFT) lancets 2 (two) times daily. use for testing  12  . levothyroxine (SYNTHROID, LEVOTHROID) 200 MCG tablet Take 1 tablet (200 mcg total) by mouth daily before breakfast. 90 tablet 3  . meloxicam (MOBIC) 7.5 MG tablet TAKE 1 TABLET BY MOUTH 2 TIMES A DAY WITH A MEAL  1  .  Nebivolol HCl (BYSTOLIC) 20 MG TABS Take 1 tablet (20 mg total) by mouth daily. 90 tablet 3  . NON FORMULARY CPAP AT BEDTIME    . ONE TOUCH ULTRA TEST test strip 2 (two) times daily. use for testing  12  . sitaGLIPtin-metformin (JANUMET) 50-1000 MG per tablet Take by mouth.    . tamoxifen (NOLVADEX) 20 MG tablet Take 1 tablet (20 mg total) by mouth daily. 90 tablet 3   No facility-administered medications prior to visit.    No Known Allergies  Review of Systems  Constitutional: Negative for fever, chills and malaise/fatigue.  Eyes: Negative.   Respiratory: Negative for cough, hemoptysis, sputum production, shortness of breath and wheezing.   Cardiovascular: Positive for palpitations. Negative for chest pain, orthopnea and  leg swelling.  Gastrointestinal: Negative.   Neurological: Negative for dizziness, weakness and headaches.  Psychiatric/Behavioral: Negative.    Objective:  BP 160/70 mmHg  Pulse 68  Temp(Src) 98.5 F (36.9 C) (Oral)  Resp 16  Wt 215 lb (97.523 kg)  Physical Exam  Constitutional: She is oriented to person, place, and time and well-developed, well-nourished, and in no distress.  HENT:  Head: Normocephalic and atraumatic.  Right Ear: External ear normal.  Left Ear: External ear normal.  Nose: Nose normal.  Neck: Normal range of motion.  Cardiovascular: Normal rate, regular rhythm and normal heart sounds.   Pulmonary/Chest: Effort normal and breath sounds normal.  Abdominal: Soft.  Musculoskeletal: She exhibits edema (Trace).  Neurological: She is alert and oriented to person, place, and time. Gait normal.  Skin: Skin is warm and dry.  Psychiatric: Mood, memory, affect and judgment normal.    Assessment and Plan :   1. Type 2 diabetes mellitus without complication, without long-term current use of insulin (HCC)  - Hemoglobin A1c  2. Other specified hypothyroidism  - TSH  3. Restless leg Patient declines any treatment today. Tramadol as the best option if we go forward presently. Consider treating with iron as a benign treatment option. 4. Anemia, unspecified anemia type Macrocytic so probably not iron deficient.  5. B12 deficiency  - Vitamin B12  I have done the exam and reviewed the above chart and it is accurate to the best of my knowledge.  Miguel Aschoff MD [facility] 10/11/2015 9:37 AM

## 2015-10-12 LAB — VITAMIN B12: Vitamin B-12: 221 pg/mL (ref 211–946)

## 2015-10-12 LAB — TSH: TSH: 0.978 u[IU]/mL (ref 0.450–4.500)

## 2015-10-12 LAB — HEMOGLOBIN A1C
Est. average glucose Bld gHb Est-mCnc: 206 mg/dL
HEMOGLOBIN A1C: 8.8 % — AB (ref 4.8–5.6)

## 2015-10-13 ENCOUNTER — Telehealth: Payer: Self-pay

## 2015-10-13 NOTE — Telephone Encounter (Signed)
Patient advised as directed below. Patient verbalized understanding. Patient scheduled for B 12 injection on 10/22/2015.   Patient would prefer after that appointment to be able to give her self injections at home.

## 2015-10-13 NOTE — Telephone Encounter (Signed)
-----   Message from Jerrol Banana., MD sent at 10/13/2015 10:59 AM EDT ----- TSH good but B12 borderline low. We'll start B12 shots once a week for a month then monthly.diabetic control much better at 8.8. I do not think patient is on B12 shots now. If she is I will increase him from monthly to every 3 weeks

## 2015-10-13 NOTE — Telephone Encounter (Signed)
LMTCB

## 2015-10-22 ENCOUNTER — Ambulatory Visit: Payer: Self-pay

## 2015-10-27 ENCOUNTER — Ambulatory Visit (INDEPENDENT_AMBULATORY_CARE_PROVIDER_SITE_OTHER): Payer: 59

## 2015-10-27 DIAGNOSIS — E538 Deficiency of other specified B group vitamins: Secondary | ICD-10-CM

## 2015-10-27 MED ORDER — CYANOCOBALAMIN 1000 MCG/ML IJ SOLN
1000.0000 ug | Freq: Once | INTRAMUSCULAR | Status: AC
  Administered 2015-10-27: 1000 ug via INTRAMUSCULAR

## 2015-11-01 ENCOUNTER — Telehealth: Payer: Self-pay | Admitting: Family Medicine

## 2015-11-01 ENCOUNTER — Other Ambulatory Visit: Payer: Self-pay

## 2015-11-01 MED ORDER — CYANOCOBALAMIN 1000 MCG/ML IJ SOLN: 1000.0000 ug | Freq: Once | INTRAMUSCULAR | Status: DC

## 2015-11-01 NOTE — Telephone Encounter (Signed)
Pt called for a new RX for B12 injections .  She uses [facility].  Her call back is [phone]  Thanks Con Memos

## 2015-11-04 NOTE — Telephone Encounter (Signed)
[facility] [place] [facility] for B 12 but not for the syringes. Pt would like an order sent to [facility] for syringes so she can pick them both up at the same time. Thanks TNP

## 2015-11-04 NOTE — Telephone Encounter (Signed)
Ok to order syringes? Please advise. Thanks!

## 2015-11-04 NOTE — Telephone Encounter (Signed)
Please send in syringes with needles.

## 2015-11-05 ENCOUNTER — Other Ambulatory Visit: Payer: Self-pay

## 2015-11-05 MED ORDER — "SYRINGE 25G X 1"" 3 ML MISC": 1.0000 mL | Status: DC

## 2015-12-28 ENCOUNTER — Ambulatory Visit: Payer: 59 | Admitting: General Surgery

## 2015-12-29 ENCOUNTER — Other Ambulatory Visit: Payer: Self-pay | Admitting: Family Medicine

## 2015-12-30 ENCOUNTER — Ambulatory Visit (INDEPENDENT_AMBULATORY_CARE_PROVIDER_SITE_OTHER): Payer: 59 | Admitting: General Surgery

## 2015-12-30 ENCOUNTER — Encounter: Payer: Self-pay | Admitting: General Surgery

## 2015-12-30 ENCOUNTER — Other Ambulatory Visit: Payer: Self-pay

## 2015-12-30 VITALS — BP 132/76 | HR 78 | Resp 14 | Ht 64.0 in | Wt 216.0 lb

## 2015-12-30 DIAGNOSIS — N641 Fat necrosis of breast: Secondary | ICD-10-CM | POA: Diagnosis not present

## 2015-12-30 DIAGNOSIS — D0511 Intraductal carcinoma in situ of right breast: Secondary | ICD-10-CM | POA: Diagnosis not present

## 2015-12-30 NOTE — Patient Instructions (Signed)
Follow up in 6 months with bilateral mammogram and office visit.

## 2015-12-30 NOTE — Progress Notes (Signed)
Patient ID: Michele [name], female   DOB: 03-Mar-1956, 60 y.o.   MRN: [id number]  Chief Complaint  Patient presents with  . Follow-up    HPI Michele [name] is a 60 y.o. female with a history of right breast cancer here for a follow up and right breast ultrasound. Her last mammogram and ultrasound was on 06/23/15.  She does report an area on her right chest wall that appeared about 5 weeks ago. She denies any itching, pain, or tenderness In this area.  Today's exam was a planned follow-up after her mammogram and ultrasound completed at [facility] in December 2016. A new area of architectural distortion which I felt represented fat necrosis was to be reassessed prior to a final decision regarding vacuum assisted biopsy.  I personally reviewed the patient's history.  HPI  Past Medical History  Diagnosis Date  . Hypertension   . CHF (congestive heart failure) ([place])   . Ischemic cardiomyopathy   . Diabetes mellitus ([place])     Type II  . Hyperlipidemia   . Coronary artery disease 2009  . Thyroid disease     hypothyroidism  . DCIS (ductal carcinoma in situ) of breast, right 2012  . GERD (gastroesophageal reflux disease)   . Sleep apnea   . Abscess of breast, right 08/19/2012  . Umbilical hernia without mention of obstruction or gangrene   . Obesity, unspecified   . Breast cancer [facility]) July 13, 2010    intermediate grade DCIS, nuclear grade 2, ER 90%, PR 90% treated with wide excision, reexcision to negative margins and MammoSite partial breast radiation.    Past Surgical History  Procedure Laterality Date  . Cardiac catheterization  01/08/2008  . Incision and drainage breast abscess Right 08/19/2012  . Tonsillectomy and adenoidectomy      age 44 yrs  . Hernia repair  2013    epigastric  . Cholecystectomy  2013  . [facility]  . Breast mass excision Right 2011    December  . Breast lumpectomy Right Jan 2012    Wide excision  . Breast biopsy Right 2007     DCIS mammosite     Family History  Problem Relation Age of Onset  . Heart failure Mother   . Diabetes Mother   . Congestive Heart Failure Mother   . CVA Mother   . Heart attack Father   . Arthritis Father   . Diabetes Sister   . Hypertension Sister   . Lung cancer Sister   . Hypertension Brother   . Stroke Maternal Grandmother   . Heart attack Maternal Grandfather   . Heart attack Paternal Grandmother   . Hypertension Brother   . Hemochromatosis Brother     Social History Social History  Substance Use Topics  . Smoking status: Never Smoker   . Smokeless tobacco: Never Used  . Alcohol Use: No    No Known Allergies  Current Outpatient Prescriptions  Medication Sig Dispense Refill  . allopurinol (ZYLOPRIM) 300 MG tablet     . amLODipine-valsartan (EXFORGE) 10-320 MG per tablet Take 1 tablet by mouth daily.    Marland Kitchen BYSTOLIC 20 MG TABS Take 1 tablet daily 90 tablet 3  . cyanocobalamin (,VITAMIN B-12,) 1000 MCG/ML injection Inject 1 mL (1,000 mcg total) into the muscle once. 10 mL 1  . fluticasone (FLONASE) 50 MCG/ACT nasal spray Place into the nose.    . levothyroxine (SYNTHROID, LEVOTHROID) 200 MCG tablet Take 1 tablet (  200 mcg total) by mouth daily before breakfast. 90 tablet 3  . meloxicam (MOBIC) 7.5 MG tablet TAKE 1 TABLET BY MOUTH 2 TIMES A DAY WITH A MEAL  1  . NON FORMULARY CPAP AT BEDTIME    . sitaGLIPtin-metformin (JANUMET) 50-1000 MG per tablet Take by mouth.    . tamoxifen (NOLVADEX) 20 MG tablet Take 1 tablet (20 mg total) by mouth daily. 90 tablet 3  . Lancets (ONETOUCH ULTRASOFT) lancets 2 (two) times daily. use for testing  12  . ONE TOUCH ULTRA TEST test strip 2 (two) times daily. use for testing  12  . Syringe/Needle, Disp, (SYRINGE 3CC/25GX1") 25G X 1" 3 ML MISC 1 mL by Does not apply route every 30 (thirty) days. 12 each 1   No current facility-administered medications for this visit.    Review of Systems Review of Systems  Constitutional: Negative.    Respiratory: Negative.   Cardiovascular: Negative.     Blood pressure 132/76, pulse 78, resp. rate 14, height 5\' 4"  (1.626 m), weight 216 lb (97.977 kg).  Physical Exam Physical Exam  Constitutional: She is oriented to person, place, and time. She appears well-developed and well-nourished.  Eyes: Conjunctivae are normal. No scleral icterus.  Neck: Neck supple.  Cardiovascular: Normal rate, regular rhythm and normal heart sounds.   Pulmonary/Chest: Effort normal and breath sounds normal. Right breast exhibits no inverted nipple, no mass, no nipple discharge, no skin change and no tenderness. Left breast exhibits no inverted nipple, no mass, no nipple discharge, no skin change and no tenderness.    There is a 5 mm thickening right breast 1 o'clk   Abdominal: Soft. Bowel sounds are normal.  Lymphadenopathy:    She has no cervical adenopathy.    She has no axillary adenopathy.  Neurological: She is alert and oriented to person, place, and time.  Skin: Skin is warm and dry.  Psychiatric: She has a normal mood and affect.    Data Reviewed Ultrasound examination of the right breast was completed. Special attention was made toward the area at the 8:30 o'clock position, 8 cm from the nipple where there is a hypoechoic area with acoustic shadowing measuring 1 x 1.4 x 1.8 cm. This is no larger than noted on her exam 6 months ago when the area measured 2.05 cm in greatest diameter. This is felt to represent fat necrosis. BI-RADS-3.  In the area of palpable thickening at the 1:00 position of the right breast 14 [place] the nipple there is a focal area of hypoechoic tissue measuring 0.4 x 0.76 x 0.8 cm. This is felt to represent an area of traumatized fat. BI-RADS-3.  Assessment    Benign breast exam.    Plan    The patient was distressed in December when the radiologist at [facility] pushed her very hard to have a biopsy that day in their facility. She is requested having her exam completed at  another facility.    We'll arrange for a follow-up bilateral diagnostic mammograms in 6 months at [facility].   PCP: Philemon Kingdom 12/30/2015, 8:45 PM

## 2016-02-14 ENCOUNTER — Ambulatory Visit: Payer: 59 | Admitting: Family Medicine

## 2016-04-04 ENCOUNTER — Ambulatory Visit: Payer: 59 | Admitting: Family Medicine

## 2016-05-03 ENCOUNTER — Ambulatory Visit (INDEPENDENT_AMBULATORY_CARE_PROVIDER_SITE_OTHER): Payer: 59 | Admitting: Family Medicine

## 2016-05-03 ENCOUNTER — Other Ambulatory Visit: Payer: Self-pay

## 2016-05-03 ENCOUNTER — Encounter: Payer: Self-pay | Admitting: Family Medicine

## 2016-05-03 VITALS — BP 120/68 | HR 80 | Temp 99.0°F | Wt 216.0 lb

## 2016-05-03 DIAGNOSIS — E538 Deficiency of other specified B group vitamins: Secondary | ICD-10-CM | POA: Diagnosis not present

## 2016-05-03 DIAGNOSIS — J0100 Acute maxillary sinusitis, unspecified: Secondary | ICD-10-CM | POA: Diagnosis not present

## 2016-05-03 DIAGNOSIS — E039 Hypothyroidism, unspecified: Secondary | ICD-10-CM

## 2016-05-03 DIAGNOSIS — E119 Type 2 diabetes mellitus without complications: Secondary | ICD-10-CM | POA: Diagnosis not present

## 2016-05-03 DIAGNOSIS — D0511 Intraductal carcinoma in situ of right breast: Secondary | ICD-10-CM

## 2016-05-03 LAB — POCT GLYCOSYLATED HEMOGLOBIN (HGB A1C)
Est. average glucose Bld gHb Est-mCnc: 286
HEMOGLOBIN A1C: 11.6

## 2016-05-03 MED ORDER — AMOXICILLIN-POT CLAVULANATE 875-125 MG PO TABS: 1.0000 | ORAL_TABLET | Freq: Two times a day (BID) | ORAL | 0 refills | Status: DC

## 2016-05-03 MED ORDER — CYANOCOBALAMIN 1000 MCG/ML IJ SOLN: INTRAMUSCULAR | 1 refills | Status: DC

## 2016-05-03 MED ORDER — SYRINGE 25G X 1" 3 ML MISC
1.0000 mL | 1 refills | Status: DC
Start: 2016-05-03 — End: 2021-09-30

## 2016-05-03 NOTE — Progress Notes (Signed)
Patient: Michele [name] Female    DOB: 05/11/1956   60 y.o.   MRN: [id number] Visit Date: 05/03/2016  Today's Provider: [name], MD   Chief Complaint  Patient presents with  . Diabetes  . Hypothyroidism  . B12 Injection  . Anemia   Subjective:    HPI    Diabetes Mellitus Type II, Follow-up:   Lab Results  Component Value Date   HGBA1C 11.6 05/03/2016   HGBA1C 8.8 (H) 10/11/2015   HGBA1C 11.1 (H) 06/02/2015    Last seen for diabetes 6 months ago.  Management since then includes no changes. She reports good compliance with treatment. She is not having side effects.  Current symptoms include none and have been stable. Home blood sugar records: not checked latley  Episodes of hypoglycemia? no   Current Insulin Regimen: none  Most Recent Eye Exam: 2 years.  Weight trend: stable Prior visit with dietician: no Current diet: well balanced Current exercise: none  Pertinent Labs:    Component Value Date/Time   CHOL 173 06/02/2015 1128   TRIG 398 (H) 06/02/2015 1128   HDL 25 (L) 06/02/2015 1128   LDLCALC 68 06/02/2015 1128   CREATININE 0.79 06/02/2015 1128    Wt Readings from Last 3 Encounters:  05/03/16 216 lb (98 kg)  12/30/15 216 lb (98 kg)  10/11/15 215 lb (97.5 kg)     Hypothyroidism, follow up:  Patient is here today for a 6 month follow up. Patient is currently taking levothyroxine 278mcg daily. She is tolerating medication well.   Vitamin B12 deficiency, follow up:  Patient reports that she has not been able to do her B12 injections due to a mix up at the pharmacy. She reports that they only gave her one 30mL vial instead of the 55mL.      No Known Allergies   Current Outpatient Prescriptions:  .  allopurinol (ZYLOPRIM) 300 MG tablet, , Disp: , Rfl:  .  amLODipine-valsartan (EXFORGE) 10-320 MG per tablet, Take 1 tablet by mouth daily., Disp: , Rfl:  .  BYSTOLIC 20 MG TABS, Take 1 tablet daily, Disp: 90 tablet, Rfl: 3 .   cyanocobalamin (,VITAMIN B-12,) 1000 MCG/ML injection, Inject 1 mL (1,000 mcg total) into the muscle once., Disp: 10 mL, Rfl: 1 .  fluticasone (FLONASE) 50 MCG/ACT nasal spray, Place into the nose., Disp: , Rfl:  .  Lancets (ONETOUCH ULTRASOFT) lancets, 2 (two) times daily. use for testing, Disp: , Rfl: 12 .  levothyroxine (SYNTHROID, LEVOTHROID) 200 MCG tablet, Take 1 tablet (200 mcg total) by mouth daily before breakfast., Disp: 90 tablet, Rfl: 3 .  meloxicam (MOBIC) 7.5 MG tablet, TAKE 1 TABLET BY MOUTH 2 TIMES A DAY WITH A MEAL, Disp: , Rfl: 1 .  NON FORMULARY, CPAP AT BEDTIME, Disp: , Rfl:  .  ONE TOUCH ULTRA TEST test strip, 2 (two) times daily. use for testing, Disp: , Rfl: 12 .  sitaGLIPtin-metformin (JANUMET) 50-1000 MG per tablet, Take by mouth., Disp: , Rfl:  .  Syringe/Needle, Disp, (SYRINGE 3CC/25GX1") 25G X 1" 3 ML MISC, 1 mL by Does not apply route every 30 (thirty) days., Disp: 12 each, Rfl: 1 .  tamoxifen (NOLVADEX) 20 MG tablet, Take 1 tablet (20 mg total) by mouth daily., Disp: 90 tablet, Rfl: 3  Review of Systems  Constitutional: Negative.   HENT: Positive for congestion, ear pain, postnasal drip, rhinorrhea, sinus pressure, sore throat, trouble swallowing and voice change.  Respiratory: Negative.   Cardiovascular: Negative.   Gastrointestinal: Negative.   Endocrine: Negative.   Neurological: Negative.   Psychiatric/Behavioral: Negative.     Social History  Substance Use Topics  . Smoking status: Never Smoker  . Smokeless tobacco: Never Used  . Alcohol use No   Objective:   BP 120/68   Pulse 80   Temp 99 F (37.2 C)   Wt 216 lb (98 kg)   BMI 37.08 kg/m   Physical Exam  Constitutional: She is oriented to person, place, and time. She appears well-developed and well-nourished.  HENT:  Head: Normocephalic and atraumatic.  Right Ear: External ear normal.  Left Ear: External ear normal.  Nose: Nose normal.  Mouth/Throat: Oropharynx is clear and moist.    Maxillary sinuses tender.   Eyes: Conjunctivae and EOM are normal. Pupils are equal, round, and reactive to light.  Neck: Normal range of motion. Neck supple.  Cardiovascular: Normal rate, regular rhythm and normal heart sounds.   Pulmonary/Chest: Effort normal and breath sounds normal.  Neurological: She is alert and oriented to person, place, and time.  Skin: Skin is warm and dry.  Psychiatric: She has a normal mood and affect. Her behavior is normal. Judgment and thought content normal.        Assessment & Plan:     1. Type 2 diabetes mellitus without complication, unspecified long term insulin use status (HCC) Worsening. Patient reports that she will try to work on diet and exercise to help bring HgbA1c down. May need insulin on next OV. - POCT glycosylated hemoglobin (Hb A1C)--11.6 today.  2. Adult hypothyroidism   3. Vitamin B12 deficiency   4. Acute maxillary sinusitis, recurrence not specified Augmentin for 10 days. 5.Obesity 6.CAD  I have done the exam and reviewed the chart and it is accurate to the best of my knowledge. Miguel Aschoff M.D. [place], MD  [facility]

## 2016-07-05 ENCOUNTER — Ambulatory Visit
Admission: RE | Admit: 2016-07-05 | Discharge: 2016-07-05 | Disposition: A | Discharge status: Home / Self Care | Payer: 59 | Source: Ambulatory Visit | Attending: General Surgery | Admitting: General Surgery

## 2016-07-05 DIAGNOSIS — Z9889 Other specified postprocedural states: Secondary | ICD-10-CM | POA: Insufficient documentation

## 2016-07-05 DIAGNOSIS — C50919 Malignant neoplasm of unspecified site of unspecified female breast: Secondary | ICD-10-CM | POA: Insufficient documentation

## 2016-07-05 DIAGNOSIS — D0511 Intraductal carcinoma in situ of right breast: Secondary | ICD-10-CM

## 2016-07-17 ENCOUNTER — Ambulatory Visit: Payer: 59 | Admitting: General Surgery

## 2016-07-31 ENCOUNTER — Encounter: Payer: Self-pay | Admitting: General Surgery

## 2016-07-31 ENCOUNTER — Ambulatory Visit (INDEPENDENT_AMBULATORY_CARE_PROVIDER_SITE_OTHER): Payer: 59 | Admitting: General Surgery

## 2016-07-31 VITALS — BP 130/72 | HR 72 | Resp 14 | Ht 64.0 in | Wt 212.0 lb

## 2016-07-31 DIAGNOSIS — D0511 Intraductal carcinoma in situ of right breast: Secondary | ICD-10-CM

## 2016-07-31 DIAGNOSIS — N641 Fat necrosis of breast: Secondary | ICD-10-CM | POA: Diagnosis not present

## 2016-07-31 NOTE — Progress Notes (Signed)
Patient ID: Michele [name], female   DOB: 12-21-1955, 60 y.o.   MRN: [id number]  Chief Complaint  Patient presents with  . Follow-up    mammogram    HPI Michele [name] is a 61 y.o. female who presents for a breast evaluation. The most recent mammogram was done on 07/05/2016. Patient does perform regular self breast checks and gets regular mammograms done.    HPI  Past Medical History:  Diagnosis Date  . Abscess of breast, right 08/19/2012  . Breast cancer ([place]) 07/13/2010   1.5 cm,intermediate grade DCIS, nuclear grade 2, ER 90%, PR 90% treated with wide excision, reexcision to negative margins and MammoSite partial breast radiation.  . CHF (congestive heart failure) ([place])   . Coronary artery disease 2009  . DCIS (ductal carcinoma in situ) of breast, right 2012  . Diabetes mellitus ([place])    Type II  . GERD (gastroesophageal reflux disease)   . Hyperlipidemia   . Hypertension   . Ischemic cardiomyopathy   . Obesity, unspecified   . Sleep apnea   . Thyroid disease    hypothyroidism  . Umbilical hernia without mention of obstruction or gangrene     Past Surgical History:  Procedure Laterality Date  . BREAST BIOPSY Right 2007   right bx neg  . BREAST EXCISIONAL BIOPSY Right 2011   DCIS mammosite lumpectomy  . BREAST LUMPECTOMY Right Jan 2012   Wide excision  . BREAST MASS EXCISION Right 2011   December  . CARDIAC CATHETERIZATION  01/08/2008  . CHOLECYSTECTOMY  2013  . [facility]  . HERNIA REPAIR  2013   epigastric  . INCISION AND DRAINAGE BREAST ABSCESS Right 08/19/2012  . TONSILLECTOMY AND ADENOIDECTOMY     age 48 yrs    Family History  Problem Relation Age of Onset  . Heart failure Mother   . Diabetes Mother   . Congestive Heart Failure Mother   . CVA Mother   . Heart attack Father   . Arthritis Father   . Diabetes Sister   . Hypertension Sister   . Lung cancer Sister   . Hypertension Brother   . Hypertension Brother   .  Hemochromatosis Brother   . Stroke Maternal Grandmother   . Heart attack Maternal Grandfather   . Heart attack Paternal Grandmother     Social History Social History  Substance Use Topics  . Smoking status: Never Smoker  . Smokeless tobacco: Never Used  . Alcohol use No    No Known Allergies  Current Outpatient Prescriptions  Medication Sig Dispense Refill  . allopurinol (ZYLOPRIM) 300 MG tablet     . amLODipine-valsartan (EXFORGE) 10-320 MG per tablet Take 1 tablet by mouth daily.    Marland Kitchen BYSTOLIC 20 MG TABS Take 1 tablet daily 90 tablet 3  . cyanocobalamin (,VITAMIN B-12,) 1000 MCG/ML injection Inject 71mL IM once weekly. 10 mL 1  . fluticasone (FLONASE) 50 MCG/ACT nasal spray Place into the nose.    . Lancets (ONETOUCH ULTRASOFT) lancets 2 (two) times daily. use for testing  12  . levothyroxine (SYNTHROID, LEVOTHROID) 200 MCG tablet Take 1 tablet (200 mcg total) by mouth daily before breakfast. 90 tablet 3  . meloxicam (MOBIC) 7.5 MG tablet TAKE 1 TABLET BY MOUTH 2 TIMES A DAY WITH A MEAL  1  . NON FORMULARY CPAP AT BEDTIME    . ONE TOUCH ULTRA TEST test strip 2 (two) times daily. use for testing  12  .  sitaGLIPtin-metformin (JANUMET) 50-1000 MG per tablet Take by mouth.    . Syringe/Needle, Disp, (SYRINGE 3CC/25GX1") 25G X 1" 3 ML MISC 1 mL by Does not apply route every 30 (thirty) days. 12 each 1  . tamoxifen (NOLVADEX) 20 MG tablet Take 1 tablet (20 mg total) by mouth daily. 90 tablet 3   No current facility-administered medications for this visit.     Review of Systems Review of Systems  Constitutional: Negative.   Respiratory: Negative.   Cardiovascular: Negative.     Blood pressure 130/72, pulse 72, resp. rate 14, height 5\' 4"  (1.626 m), weight 212 lb (96.2 kg).  Physical Exam Physical Exam  Constitutional: She is oriented to person, place, and time. She appears well-developed and well-nourished.  Eyes: Conjunctivae are normal. No scleral icterus.  Neck: Neck  supple.  Cardiovascular: Normal rate, regular rhythm and normal heart sounds.   Pulmonary/Chest: Effort normal and breath sounds normal. Right breast exhibits no inverted nipple, no mass, no nipple discharge, no skin change and no tenderness. Left breast exhibits no inverted nipple, no mass, no nipple discharge, no skin change and no tenderness.    Abdominal: Soft. Bowel sounds are normal. There is no tenderness.  Lymphadenopathy:    She has no cervical adenopathy.  Neurological: She is alert and oriented to person, place, and time.  Skin: Skin is warm and dry.  Psychiatric: She has a normal mood and affect.    Data Reviewed Mammogram dated 07/05/2016 as well as ultrasound was reviewed. Mammography shows no change in the focal thickening site of previous surgery and radiation. Ultrasound reported an interval change and irregular, hypoechoic mass measuring up to 2.3 cm in diameter. Previously measuring 2.1 cm in December 2016. The  BIRAD rating was 4.  The patient reported that the radiologist told her that her attending surgeon was  Unqualified to make decisions about her management and that she had grave concerns about the care being provided.  This detail was not noted in Dr. Theda Sers is clinical note of 07/05/2016.  Assessment    Postsurgical changes now 5 years status post wide excision with mastoplasty in accelerated partial breast radiation. Intermediate grade DCIS.    Plan    We had a long discussion regarding options for management: 1) continued observation versus 2 core biopsy to confirm the clinical impression of fat necrosis.  Pros and cons of each treatment option werereviewed.  After being badger by the radiologist both the time of her December 2016 as well as her 2017 exam regarding the need for biopsy, the patient is chosen to proceed to biopsy to settle th once and for all.  This will be scheduled at a convenient date as an office procedure.    Thee opportunity to have  the procedure completed by the radiologist was declined by the patient.  The patient will complete her presence supply of tamoxifen.    This has been scribed by Lesly Rubenstein LPN   .Robert Bellow 07/31/2016, 8:04 PM

## 2016-07-31 NOTE — Patient Instructions (Addendum)
May finish up your Tamoxifen. Start taking a Baby Aspirin.

## 2016-08-03 ENCOUNTER — Ambulatory Visit: Payer: 59 | Admitting: Family Medicine

## 2016-08-10 ENCOUNTER — Encounter: Payer: Self-pay | Admitting: General Surgery

## 2016-08-10 ENCOUNTER — Inpatient Hospital Stay (INDEPENDENT_AMBULATORY_CARE_PROVIDER_SITE_OTHER): Payer: 59

## 2016-08-10 ENCOUNTER — Ambulatory Visit: Payer: 59 | Admitting: General Surgery

## 2016-08-10 VITALS — BP 132/74 | HR 80 | Resp 12 | Ht 63.0 in | Wt 211.0 lb

## 2016-08-10 DIAGNOSIS — N6310 Unspecified lump in the right breast, unspecified quadrant: Secondary | ICD-10-CM

## 2016-08-10 HISTORY — PX: BREAST BIOPSY: SHX20

## 2016-08-10 NOTE — Patient Instructions (Signed)

## 2016-08-10 NOTE — Progress Notes (Signed)
Patient ID: Michele [name], female   DOB: 1956/01/03, 61 y.o.   MRN: [id number]  Chief Complaint  Patient presents with  . Procedure    HPI Michele [name] is a 61 y.o. female here today for a right breast biopsy.  HPI  Past Medical History:  Diagnosis Date  . Abscess of breast, right 08/19/2012  . Breast cancer ([place]) 07/13/2010   1.5 cm,intermediate grade DCIS, nuclear grade 2, ER 90%, PR 90% treated with wide excision, reexcision to negative margins and MammoSite partial breast radiation.  . CHF (congestive heart failure) ([place])   . Coronary artery disease 2009  . DCIS (ductal carcinoma in situ) of breast, right 2012  . Diabetes mellitus ([place])    Type II  . GERD (gastroesophageal reflux disease)   . Hyperlipidemia   . Hypertension   . Ischemic cardiomyopathy   . Obesity, unspecified   . Sleep apnea   . Thyroid disease    hypothyroidism  . Umbilical hernia without mention of obstruction or gangrene     Past Surgical History:  Procedure Laterality Date  . BREAST BIOPSY Right 2007   right bx neg  . BREAST EXCISIONAL BIOPSY Right 2011   DCIS mammosite lumpectomy  . BREAST LUMPECTOMY Right Jan 2012   Wide excision  . BREAST MASS EXCISION Right 2011   December  . CARDIAC CATHETERIZATION  01/08/2008  . CHOLECYSTECTOMY  2013  [facility]  . HERNIA REPAIR  2013   epigastric  . INCISION AND DRAINAGE BREAST ABSCESS Right 08/19/2012  . TONSILLECTOMY AND ADENOIDECTOMY     age 89 yrs    Family History  Problem Relation Age of Onset  . Heart failure Mother   . Diabetes Mother   . Congestive Heart Failure Mother   . CVA Mother   . Heart attack Father   . Arthritis Father   . Diabetes Sister   . Hypertension Sister   . Lung cancer Sister   . Hypertension Brother   . Hypertension Brother   . Hemochromatosis Brother   . Stroke Maternal Grandmother   . Heart attack Maternal Grandfather   . Heart attack Paternal Grandmother     Social  History Social History  Substance Use Topics  . Smoking status: Never Smoker  . Smokeless tobacco: Never Used  . Alcohol use No    No Known Allergies  Current Outpatient Prescriptions  Medication Sig Dispense Refill  . allopurinol (ZYLOPRIM) 300 MG tablet     . amLODipine-valsartan (EXFORGE) 10-320 MG per tablet Take 1 tablet by mouth daily.    Marland Kitchen BYSTOLIC 20 MG TABS Take 1 tablet daily 90 tablet 3  . cyanocobalamin (,VITAMIN B-12,) 1000 MCG/ML injection Inject 53mL IM once weekly. 10 mL 1  . fluticasone (FLONASE) 50 MCG/ACT nasal spray Place into the nose.    . Lancets (ONETOUCH ULTRASOFT) lancets 2 (two) times daily. use for testing  12  . levothyroxine (SYNTHROID, LEVOTHROID) 200 MCG tablet Take 1 tablet (200 mcg total) by mouth daily before breakfast. 90 tablet 3  . meloxicam (MOBIC) 7.5 MG tablet TAKE 1 TABLET BY MOUTH 2 TIMES A DAY WITH A MEAL  1  . NON FORMULARY CPAP AT BEDTIME    . ONE TOUCH ULTRA TEST test strip 2 (two) times daily. use for testing  12  . sitaGLIPtin-metformin (JANUMET) 50-1000 MG per tablet Take by mouth.    . Syringe/Needle, Disp, (SYRINGE 3CC/25GX1") 25G X 1" 3 ML MISC  1 mL by Does not apply route every 30 (thirty) days. 12 each 1  . tamoxifen (NOLVADEX) 20 MG tablet Take 1 tablet (20 mg total) by mouth daily. 90 tablet 3   No current facility-administered medications for this visit.     Review of Systems Review of Systems  Constitutional: Negative.   Respiratory: Negative.   Cardiovascular: Negative.     Blood pressure 132/74, pulse 80, resp. rate 12, height 5\' 3"  (1.6 m), weight 211 lb (95.7 kg).  Physical Exam Physical Exam  Constitutional: She is oriented to person, place, and time. She appears well-developed and well-nourished.  Pulmonary/Chest:    Neurological: She is alert and oriented to person, place, and time.  Skin: Skin is warm and dry.    Data Reviewed Ultrasound examination of the right breast confirm the hypoechoic area  described in the [facility] study of last month. This is a hypoechoic area with acoustic shadowing measuring 1.0 x 1.77 x 2.13 cm at the 8:30 o'clock position, 7 cm from the nipple.  10 mL of 0.5% Xylocaine with 0.25% Marcaine with 1-200,000 of epinephrine was utilized well tolerated. Initial plans were to make use of a 12-gauge Finesse device. After to core samples were obtained the device malfunctioned and replacing the needle drive unit failed to resolve the issue. Additional biopsies 8 were completed making use of a 14-gauge Bard core biopsy device. The procedure was well tolerated. No bleeding was noted. Core samples grossly appeared to represent fibrosis. Postbiopsy clip was placed. Skin defect was closed with benzoin and Steri-Strips. Telfa and Tegaderm dressing applied. Postbiopsy instructions provided.  Assessment    Mammographic abnormality likely related to previous surgery and radiation.    Plan    The patient will be contacted when pathology results are available. Assuming benign results would plan for follow-up examination with mammogram in 1 year.     This information has been scribed by Karie Fetch RN, BSN,BC. Marland Kitchen   Robert Bellow 08/10/2016, 1:39 PM

## 2016-08-29 ENCOUNTER — Telehealth: Payer: Self-pay | Admitting: Family Medicine

## 2016-08-29 ENCOUNTER — Other Ambulatory Visit: Payer: Self-pay

## 2016-08-29 MED ORDER — SITAGLIPTIN PHOS-METFORMIN HCL 50-1000 MG PO TABS: 1.0000 | ORAL_TABLET | Freq: Every day | ORAL | 3 refills | Status: DC

## 2016-08-29 NOTE — Telephone Encounter (Signed)
Ok--year rf,

## 2016-08-29 NOTE — Telephone Encounter (Signed)
Pt is requesting a refill of sitaGLIPtin-metformin (JANUMET) 50-1000 MG per tablet be sent to [place] for a 90 day supply

## 2016-08-29 NOTE — Telephone Encounter (Signed)
Ok to refill? Please advise. Thanks!  

## 2016-08-30 ENCOUNTER — Other Ambulatory Visit: Payer: Self-pay

## 2016-08-30 MED ORDER — SITAGLIPTIN PHOS-METFORMIN HCL 50-1000 MG PO TABS: 1.0000 | ORAL_TABLET | Freq: Every day | ORAL | 3 refills | Status: DC

## 2016-08-30 NOTE — Telephone Encounter (Signed)
Pt stated that the Rx for sitaGLIPtin-metformin (JANUMET) 50-1000 MG tablet was sent to local pharmacy and she had requested it to be sent to [facility]. Pt would like this sent to [facility]. Please advise. Thanks TNP

## 2016-09-07 ENCOUNTER — Ambulatory Visit: Payer: 59 | Admitting: Family Medicine

## 2016-09-20 ENCOUNTER — Other Ambulatory Visit: Payer: Self-pay | Admitting: Family Medicine

## 2016-09-20 DIAGNOSIS — E039 Hypothyroidism, unspecified: Secondary | ICD-10-CM

## 2016-09-21 ENCOUNTER — Ambulatory Visit: Payer: Self-pay | Admitting: Family Medicine

## 2016-09-23 ENCOUNTER — Other Ambulatory Visit: Payer: Self-pay | Admitting: Family Medicine

## 2016-10-18 ENCOUNTER — Ambulatory Visit (INDEPENDENT_AMBULATORY_CARE_PROVIDER_SITE_OTHER): Payer: 59 | Admitting: Family Medicine

## 2016-10-18 VITALS — BP 132/64 | HR 68 | Temp 99.7°F | Resp 14 | Wt 209.0 lb

## 2016-10-18 DIAGNOSIS — E784 Other hyperlipidemia: Secondary | ICD-10-CM | POA: Diagnosis not present

## 2016-10-18 DIAGNOSIS — E119 Type 2 diabetes mellitus without complications: Secondary | ICD-10-CM | POA: Diagnosis not present

## 2016-10-18 DIAGNOSIS — I2510 Atherosclerotic heart disease of native coronary artery without angina pectoris: Secondary | ICD-10-CM | POA: Diagnosis not present

## 2016-10-18 DIAGNOSIS — E538 Deficiency of other specified B group vitamins: Secondary | ICD-10-CM | POA: Diagnosis not present

## 2016-10-18 DIAGNOSIS — E7849 Other hyperlipidemia: Secondary | ICD-10-CM

## 2016-10-18 DIAGNOSIS — E039 Hypothyroidism, unspecified: Secondary | ICD-10-CM | POA: Diagnosis not present

## 2016-10-18 DIAGNOSIS — R7989 Other specified abnormal findings of blood chemistry: Secondary | ICD-10-CM

## 2016-10-18 LAB — POCT GLYCOSYLATED HEMOGLOBIN (HGB A1C): Hemoglobin A1C: 9.1

## 2016-10-18 MED ORDER — ONETOUCH ULTRASOFT LANCETS MISC: 12 refills | Status: DC

## 2016-10-18 MED ORDER — ONETOUCH ULTRA SYSTEM W/DEVICE KIT: PACK | 0 refills | Status: DC

## 2016-10-18 MED ORDER — SITAGLIPTIN PHOS-METFORMIN HCL 50-1000 MG PO TABS: 1.0000 | ORAL_TABLET | Freq: Two times a day (BID) | ORAL | 3 refills | Status: DC

## 2016-10-18 MED ORDER — ONETOUCH ULTRA BLUE VI STRP
ORAL_STRIP | 12 refills | Status: DC
Start: 2016-10-18 — End: 2017-10-20

## 2016-10-18 NOTE — Progress Notes (Signed)
Michele [name]  MRN: [id number] DOB: [birth year]  Subjective:  HPI  Patient is here for follow up. LOV was 05/03/16. Diabetes: patient checks her sugar sometimes and because her machine is reading higher level sugar then her husband she is not sure if it accurate. Readings she gets usually 2 hours after she eats and numbers have been around 200-220. Patient is not having numbness or tingling sensation. Lab Results  Component Value Date   HGBA1C 11.6 05/03/2016   Hypothyroidism: last level checked was on 10/11/15. Lab Results  Component Value Date   TSH 0.978 10/11/2015   Hypertension: patient is not checking her b/p at home. BP Readings from Last 3 Encounters:  10/18/16 132/64  08/10/16 132/74  07/31/16 130/72   Last B12 level was done on 10/11/15. MetC, CBC and Lipid on 06/02/15. Patient is still doing B12 injections at home, every other week. She started this due to her levels of B12 not that she was having any symptoms of concern related to this.  Patient Active Problem List   Diagnosis Date Noted  . Fat necrosis (segmental) of breast 12/30/2015  . Allergic rhinitis 11/28/2014  . Acid reflux 11/28/2014  . Adult hypothyroidism 11/28/2014  . DCIS (ductal carcinoma in situ) 06/03/2014  . Diabetes mellitus ([place]) 03/14/2012  . Shortness of breath 12/13/2011  . CAD (coronary artery disease) 12/13/2011  . Obesity 12/13/2011  . Hyperlipidemia 12/13/2011  . Hypertension 12/13/2011    Past Medical History:  Diagnosis Date  . Abscess of breast, right 08/19/2012  . Breast cancer ([place]) 07/13/2010   1.5 cm,intermediate grade DCIS, nuclear grade 2, ER 90%, PR 90% treated with wide excision, reexcision to negative margins and MammoSite partial breast radiation.  . CHF (congestive heart failure) ([place])   . Coronary artery disease 2009  . DCIS (ductal carcinoma in situ) of breast, right 2012  . Diabetes mellitus ([place])    Type II  . GERD (gastroesophageal reflux disease)   .  Hyperlipidemia   . Hypertension   . Ischemic cardiomyopathy   . Obesity, unspecified   . Sleep apnea   . Thyroid disease    hypothyroidism  . Umbilical hernia without mention of obstruction or gangrene     Social History   Social History  . Marital status: Married    Spouse name: Ludwig Clarks  . Number of children: 0  . Years of education: N/A   Occupational History  . retired [employer]   Social History Main Topics  . Smoking status: Never Smoker  . Smokeless tobacco: Never Used  . Alcohol use No  . Drug use: No  . Sexual activity: Not on file   Other Topics Concern  . Not on file   Social History Narrative  . No narrative on file    Outpatient Encounter Prescriptions as of 10/18/2016  Medication Sig Note  . amLODipine-valsartan (EXFORGE) 10-320 MG per tablet Take 1 tablet by mouth daily.   Marland Kitchen BYSTOLIC 20 MG TABS Take 1 tablet daily   . cyanocobalamin (,VITAMIN B-12,) 1000 MCG/ML injection INJECT 1ML IM ONCE WEEKLY.   . fluticasone (FLONASE) 50 MCG/ACT nasal spray Place into the nose. 11/28/2014: Medication taken as needed. GENERIC OK Received from: [facility]  . Lancets (ONETOUCH ULTRASOFT) lancets 2 (two) times daily. use for testing 06/01/2015: Received from: External Pharmacy  . meloxicam (MOBIC) 7.5 MG tablet TAKE 1 TABLET BY MOUTH 2 TIMES A DAY WITH A MEAL 06/01/2015: Received from: External Pharmacy  . NON FORMULARY  CPAP AT BEDTIME   . ONE TOUCH ULTRA TEST test strip 2 (two) times daily. use for testing 06/01/2015: Received from: External Pharmacy  . sitaGLIPtin-metformin (JANUMET) 50-1000 MG tablet Take 1 tablet by mouth daily. (Patient taking differently: Take 1 tablet by mouth 2 (two) times daily with a meal. )   . SYNTHROID 200 MCG tablet TAKE 1 TABLET BY MOUTH  DAILY BEFORE BREAKFAST   . Syringe/Needle, Disp, (SYRINGE 3CC/25GX1") 25G X 1" 3 ML MISC 1 mL by Does not apply route every 30 (thirty) days.   . [DISCONTINUED] allopurinol (ZYLOPRIM) 300 MG  tablet  06/19/2013: Received from: External Pharmacy  . [DISCONTINUED] tamoxifen (NOLVADEX) 20 MG tablet Take 1 tablet (20 mg total) by mouth daily.    No facility-administered encounter medications on file as of 10/18/2016.     No Known Allergies  Review of Systems  Constitutional: Negative.   Eyes: Negative.   Respiratory: Negative.   Cardiovascular: Negative.   Gastrointestinal: Negative.   Musculoskeletal: Positive for joint pain.  Skin: Negative.   Neurological: Negative.   Endo/Heme/Allergies: Negative.   Psychiatric/Behavioral: Negative.     Objective:  BP 132/64   Pulse 68   Temp 99.7 F (37.6 C)   Resp 14   Wt 209 lb (94.8 kg)   BMI 37.02 kg/m   Physical Exam  Constitutional: She is oriented to person, place, and time and well-developed, well-nourished, and in no distress.  HENT:  Head: Normocephalic and atraumatic.  Right Ear: External ear normal.  Left Ear: External ear normal.  Eyes: Conjunctivae are normal. Pupils are equal, round, and reactive to light.  Neck: Normal range of motion. Neck supple.  Cardiovascular: Normal rate, regular rhythm, normal heart sounds and intact distal pulses.   No murmur heard. Pulmonary/Chest: Effort normal and breath sounds normal. No respiratory distress. She has no wheezes.  Abdominal: Soft.  Musculoskeletal: She exhibits no edema or tenderness.  Neurological: She is alert and oriented to person, place, and time.  Skin: Skin is warm and dry.  Psychiatric: Mood, memory, affect and judgment normal.   Assessment and Plan :  1. Adult hypothyroidism - TSH  2. Vitamin B12 deficiency - B12  3. Other hyperlipidemia - Comprehensive metabolic panel - Lipid Panel With LDL/HDL Ratio  4. Coronary artery disease involving native coronary artery of native heart without angina pectoris - CBC w/Diff/Platelet  5. Type 2 diabetes mellitus without complication, without long-term current use of insulin (HCC) A1C 9.1. Better.  Discussed importance of getting sugars under control. Refilled strips,lancets and RX for new meter provided. - ONE TOUCH ULTRA TEST test strip; Check sugar once daily. DX E11.9  Dispense: 30 each; Refill: 12 - Lancets (ONETOUCH ULTRASOFT) lancets; Check sugar once daily DX E11.9  Dispense: 30 each; Refill: 12 - POCT HgB A1C  6. Elevated ferritin - Iron Binding Cap (TIBC) HPI, Exam and A&P transcribed under direction and in the presence of Miguel Aschoff, MD. I have done the exam and reviewed the chart and it is accurate to the best of my knowledge. Development worker, community has been used and  any errors in dictation or transcription are unintentional. Miguel Aschoff M.D. [facility]

## 2016-10-19 LAB — LIPID PANEL WITH LDL/HDL RATIO
CHOLESTEROL TOTAL: 188 mg/dL (ref 100–199)
HDL: 30 mg/dL — ABNORMAL LOW (ref >39)
LDL CALC: 85 mg/dL (ref 0–99)
LDl/HDL Ratio: 2.8 ratio (ref 0.0–3.2)
TRIGLYCERIDES: 367 mg/dL — AB (ref 0–149)
VLDL CHOLESTEROL CAL: 73 mg/dL — AB (ref 5–40)

## 2016-10-19 LAB — VITAMIN B12: Vitamin B-12: 514 pg/mL (ref 232–1245)

## 2016-10-19 LAB — COMPREHENSIVE METABOLIC PANEL
ALK PHOS: 44 IU/L (ref 39–117)
ALT: 15 IU/L (ref 0–32)
AST: 16 IU/L (ref 0–40)
Albumin/Globulin Ratio: 1.3 (ref 1.2–2.2)
Albumin: 4.4 g/dL (ref 3.6–4.8)
BUN/Creatinine Ratio: 26 (ref 12–28)
BUN: 19 mg/dL (ref 8–27)
Bilirubin Total: 0.8 mg/dL (ref 0.0–1.2)
CO2: 22 mmol/L (ref 18–29)
CREATININE: 0.74 mg/dL (ref 0.57–1.00)
Calcium: 9.5 mg/dL (ref 8.7–10.3)
Chloride: 96 mmol/L (ref 96–106)
GFR calc Af Amer: 101 mL/min/{1.73_m2} (ref >59)
GFR calc non Af Amer: 88 mL/min/{1.73_m2} (ref >59)
GLUCOSE: 282 mg/dL — AB (ref 65–99)
Globulin, Total: 3.4 g/dL (ref 1.5–4.5)
Potassium: 3.8 mmol/L (ref 3.5–5.2)
SODIUM: 139 mmol/L (ref 134–144)
Total Protein: 7.8 g/dL (ref 6.0–8.5)

## 2016-10-19 LAB — CBC WITH DIFFERENTIAL/PLATELET
BASOS ABS: 0.1 10*3/uL (ref 0.0–0.2)
Basos: 0 %
EOS (ABSOLUTE): 0.2 10*3/uL (ref 0.0–0.4)
Eos: 2 %
Hematocrit: 45 % (ref 34.0–46.6)
Hemoglobin: 15.1 g/dL (ref 11.1–15.9)
Immature Grans (Abs): 0 10*3/uL (ref 0.0–0.1)
Immature Granulocytes: 0 %
LYMPHS ABS: 5.1 10*3/uL — AB (ref 0.7–3.1)
Lymphs: 37 %
MCH: 31.2 pg (ref 26.6–33.0)
MCHC: 33.6 g/dL (ref 31.5–35.7)
MCV: 93 fL (ref 79–97)
MONOS ABS: 0.8 10*3/uL (ref 0.1–0.9)
Monocytes: 6 %
Neutrophils Absolute: 7.5 10*3/uL — ABNORMAL HIGH (ref 1.4–7.0)
Neutrophils: 55 %
PLATELETS: 406 10*3/uL — AB (ref 150–379)
RBC: 4.84 x10E6/uL (ref 3.77–5.28)
RDW: 13.7 % (ref 12.3–15.4)
WBC: 13.8 10*3/uL — AB (ref 3.4–10.8)

## 2016-10-19 LAB — IRON AND TIBC
IRON SATURATION: 34 % (ref 15–55)
Iron: 126 ug/dL (ref 27–139)
Total Iron Binding Capacity: 369 ug/dL (ref 250–450)
UIBC: 243 ug/dL (ref 118–369)

## 2016-10-19 LAB — TSH: TSH: 2.61 u[IU]/mL (ref 0.450–4.500)

## 2017-02-20 ENCOUNTER — Ambulatory Visit: Payer: 59 | Admitting: Family Medicine

## 2017-03-26 ENCOUNTER — Encounter: Payer: Self-pay | Admitting: Family Medicine

## 2017-03-26 ENCOUNTER — Ambulatory Visit (INDEPENDENT_AMBULATORY_CARE_PROVIDER_SITE_OTHER): Payer: 59 | Admitting: Family Medicine

## 2017-03-26 VITALS — BP 132/68 | HR 82 | Temp 98.0°F | Resp 16 | Wt 208.0 lb

## 2017-03-26 DIAGNOSIS — I2510 Atherosclerotic heart disease of native coronary artery without angina pectoris: Secondary | ICD-10-CM | POA: Diagnosis not present

## 2017-03-26 DIAGNOSIS — E784 Other hyperlipidemia: Secondary | ICD-10-CM | POA: Diagnosis not present

## 2017-03-26 DIAGNOSIS — I10 Essential (primary) hypertension: Secondary | ICD-10-CM | POA: Diagnosis not present

## 2017-03-26 DIAGNOSIS — E7849 Other hyperlipidemia: Secondary | ICD-10-CM

## 2017-03-26 DIAGNOSIS — E119 Type 2 diabetes mellitus without complications: Secondary | ICD-10-CM | POA: Diagnosis not present

## 2017-03-26 LAB — POCT GLYCOSYLATED HEMOGLOBIN (HGB A1C): HEMOGLOBIN A1C: 10.8

## 2017-03-26 MED ORDER — LOSARTAN POTASSIUM 100 MG PO TABS: 100.0000 mg | ORAL_TABLET | Freq: Every day | ORAL | 3 refills | Status: DC

## 2017-03-26 MED ORDER — AMLODIPINE BESYLATE 10 MG PO TABS: 10.0000 mg | ORAL_TABLET | Freq: Every day | ORAL | 3 refills | Status: DC

## 2017-03-26 MED ORDER — GLIMEPIRIDE 4 MG PO TABS: 4.0000 mg | ORAL_TABLET | Freq: Two times a day (BID) | ORAL | 3 refills | Status: DC

## 2017-03-26 NOTE — Progress Notes (Signed)
Patient: Michele [name] Female    DOB: 11/01/1955   61 y.o.   MRN: [id number] Visit Date: 03/26/2017  Today's Provider: [name], MD   Chief Complaint  Patient presents with  . Diabetes   Subjective:    HPI  Diabetes Mellitus Type II, Follow-up:   Lab Results  Component Value Date   HGBA1C 10.8 03/26/2017   HGBA1C 9.1 10/18/2016   HGBA1C 11.6 05/03/2016    Last seen for diabetes 4 months ago.  Management since then includes none. She reports good compliance with treatment. She is not having side effects.  Home blood sugar records: 200-250  Episodes of hypoglycemia? no   Current Insulin Regimen: n/a Most Recent Eye Exam: has been 2 years but pt is scheduled 06/14/17 Current exercise: swimming 2-3 times a week.  Pertinent Labs:    Component Value Date/Time   CHOL 188 10/18/2016 1432   TRIG 367 (H) 10/18/2016 1432   HDL 30 (L) 10/18/2016 1432   LDLCALC 85 10/18/2016 1432   CREATININE 0.74 10/18/2016 1432    Wt Readings from Last 3 Encounters:  03/26/17 208 lb (94.3 kg)  10/18/16 209 lb (94.8 kg)  08/10/16 211 lb (95.7 kg)    ------------------------------------------------------------------------ Pt needs refills on her Amlodipine/Valsartan. Also she has been having diarrhea that she thinks is assciated with her Janumet.     No Known Allergies   Current Outpatient Prescriptions:  .  aspirin EC 81 MG tablet, Take 81 mg by mouth daily., Disp: , Rfl:  .  Blood Glucose Monitoring Suppl (ONE TOUCH ULTRA SYSTEM KIT) w/Device KIT, Check sugar once daily. DX E11.9, Disp: 1 each, Rfl: 0 .  BYSTOLIC 20 MG TABS, Take 1 tablet daily, Disp: 90 tablet, Rfl: 3 .  cyanocobalamin (,VITAMIN B-12,) 1000 MCG/ML injection, INJECT 1ML IM ONCE WEEKLY., Disp: 10 mL, Rfl: 12 .  fluticasone (FLONASE) 50 MCG/ACT nasal spray, Place into the nose., Disp: , Rfl:  .  Lancets (ONETOUCH ULTRASOFT) lancets, Check sugar once daily DX E11.9, Disp: 30 each, Rfl: 12 .   meloxicam (MOBIC) 7.5 MG tablet, TAKE 1 TABLET BY MOUTH 2 TIMES A DAY WITH A MEAL, Disp: , Rfl: 1 .  NON FORMULARY, CPAP AT BEDTIME, Disp: , Rfl:  .  ONE TOUCH ULTRA TEST test strip, Check sugar once daily. DX E11.9, Disp: 30 each, Rfl: 12 .  sitaGLIPtin-metformin (JANUMET) 50-1000 MG tablet, Take 1 tablet by mouth 2 (two) times daily with a meal., Disp: 180 tablet, Rfl: 3 .  SYNTHROID 200 MCG tablet, TAKE 1 TABLET BY MOUTH  DAILY BEFORE BREAKFAST, Disp: 90 tablet, Rfl: 3 .  amLODipine (NORVASC) 10 MG tablet, Take 1 tablet (10 mg total) by mouth daily., Disp: 90 tablet, Rfl: 3 .  glimepiride (AMARYL) 4 MG tablet, Take 1 tablet (4 mg total) by mouth 2 (two) times daily., Disp: 120 tablet, Rfl: 3 .  losartan (COZAAR) 100 MG tablet, Take 1 tablet (100 mg total) by mouth daily., Disp: 90 tablet, Rfl: 3 .  Syringe/Needle, Disp, (SYRINGE 3CC/25GX1") 25G X 1" 3 ML MISC, 1 mL by Does not apply route every 30 (thirty) days., Disp: 12 each, Rfl: 1  Review of Systems  Constitutional: Negative.   HENT: Negative.   Eyes: Negative.   Respiratory: Negative.   Cardiovascular: Negative.   Gastrointestinal: Positive for diarrhea.  Endocrine: Negative.   Genitourinary: Negative.   Musculoskeletal: Negative.   Skin: Negative.   Allergic/Immunologic: Negative.   Neurological:  Negative.   Hematological: Negative.   Psychiatric/Behavioral: Negative.     Social History  Substance Use Topics  . Smoking status: Never Smoker  . Smokeless tobacco: Never Used  . Alcohol use No   Objective:   BP 132/68 (BP Location: Left Arm, Patient Position: Sitting, Cuff Size: Large)   Pulse 82   Temp 98 F (36.7 C) (Oral)   Resp 16   Wt 208 lb (94.3 kg)   BMI 36.85 kg/m  Vitals:   03/26/17 1207  BP: 132/68  Pulse: 82  Resp: 16  Temp: 98 F (36.7 C)  TempSrc: Oral  Weight: 208 lb (94.3 kg)     Physical Exam  Constitutional: She is oriented to person, place, and time. She appears well-developed and  well-nourished.  HENT:  Head: Normocephalic and atraumatic.  Eyes: Pupils are equal, round, and reactive to light. Conjunctivae and EOM are normal. No scleral icterus.  Neck: Normal range of motion. Neck supple. No thyromegaly present.  Cardiovascular: Normal rate, regular rhythm, normal heart sounds and intact distal pulses.   Pulmonary/Chest: Effort normal and breath sounds normal.  Abdominal: Soft.  Musculoskeletal: Normal range of motion.  Neurological: She is alert and oriented to person, place, and time. She has normal reflexes.  Skin: Skin is warm and dry.  Psychiatric: She has a normal mood and affect. Her behavior is normal. Judgment and thought content normal.        Assessment & Plan:    .  1. Type 2 diabetes mellitus without complication, without long-term current use of insulin ([place])  Patient says she is intolerant of Jardiance  As  she urinated all the time.  She also has failed Bydureon and Victoza.  insulin is probably next treatment option. - POCT HgB A1C 10.8 today. Worsening. Stop Janumet and start Glimepiride.  - glimepiride (AMARYL) 4 MG tablet; Take 1 tablet (4 mg total) by mouth 2 (two) times daily.  Dispense: 120 tablet; Refill: 3  2. Coronary artery disease involving native coronary artery of native heart without angina pectoris   3. Other hyperlipidemia   4. Essential hypertension Change from Amlodipine/Valsartan due to recall.  - losartan (COZAAR) 100 MG tablet; Take 1 tablet (100 mg total) by mouth daily.  Dispense: 90 tablet; Refill: 3 - amLODipine (NORVASC) 10 MG tablet; Take 1 tablet (10 mg total) by mouth daily.  Dispense: 90 tablet; Refill: 3     Tried Bydureon and Victoza  I have done the exam and reviewed the above chart and it is accurate to the best of my knowledge. Development worker, community has been used in this note in any air is in the dictation or transcription are unintentional.  [name], MD  [place]

## 2017-06-14 LAB — HM DIABETES EYE EXAM

## 2017-06-22 ENCOUNTER — Other Ambulatory Visit: Payer: Self-pay | Admitting: Family Medicine

## 2017-06-27 ENCOUNTER — Ambulatory Visit: Payer: Self-pay | Admitting: Family Medicine

## 2017-07-11 ENCOUNTER — Encounter: Payer: Self-pay | Admitting: Family Medicine

## 2017-07-11 ENCOUNTER — Ambulatory Visit (INDEPENDENT_AMBULATORY_CARE_PROVIDER_SITE_OTHER): Payer: 59 | Admitting: Family Medicine

## 2017-07-11 VITALS — BP 122/62 | HR 74 | Temp 98.4°F | Resp 16 | Wt 215.0 lb

## 2017-07-11 DIAGNOSIS — E119 Type 2 diabetes mellitus without complications: Secondary | ICD-10-CM | POA: Diagnosis not present

## 2017-07-11 DIAGNOSIS — I10 Essential (primary) hypertension: Secondary | ICD-10-CM | POA: Diagnosis not present

## 2017-07-11 DIAGNOSIS — D0511 Intraductal carcinoma in situ of right breast: Secondary | ICD-10-CM | POA: Diagnosis not present

## 2017-07-11 DIAGNOSIS — I2510 Atherosclerotic heart disease of native coronary artery without angina pectoris: Secondary | ICD-10-CM | POA: Diagnosis not present

## 2017-07-11 LAB — POCT GLYCOSYLATED HEMOGLOBIN (HGB A1C): HEMOGLOBIN A1C: 11

## 2017-07-11 MED ORDER — INSULIN GLARGINE 100 UNIT/ML SOLOSTAR PEN: 30.0000 [IU] | PEN_INJECTOR | Freq: Every day | SUBCUTANEOUS | 11 refills | Status: DC

## 2017-07-11 NOTE — Patient Instructions (Addendum)
Stop Glimepiride. Start with 10 units daily (rx will say 30 units but start at 10). We will start increasing slowly as follows (3 days in a row greater then 200 increased by 2 units.)  Check your sugar every morning before breakfast. Follow up with [place] in 2-4 weeks and bring your readings with you.

## 2017-07-11 NOTE — Progress Notes (Signed)
Patient: Michele [name] Female    DOB: 01/23/1956   62 y.o.   MRN: [id number] Visit Date: 07/11/2017  Today's Provider: [name], MD   Chief Complaint  Patient presents with  . Diabetes  . Hypertension   Subjective:    HPI  Diabetes Mellitus Type II, Follow-up:   Lab Results  Component Value Date   HGBA1C 10.8 03/26/2017   HGBA1C 9.1 10/18/2016   HGBA1C 11.6 05/03/2016    Last seen for diabetes 3 months ago.  Management since then includes started glimepride. She reports good compliance with treatment. She is not having side effects.  Home blood sugar records: running around 300   Episodes of hypoglycemia? no   Current Insulin Regimen: n/a Most Recent Eye Exam: 03/26/2017 Current exercise: none  Pertinent Labs:    Component Value Date/Time   CHOL 188 10/18/2016 1432   TRIG 367 (H) 10/18/2016 1432   HDL 30 (L) 10/18/2016 1432   LDLCALC 85 10/18/2016 1432   CREATININE 0.74 10/18/2016 1432    Wt Readings from Last 3 Encounters:  07/11/17 215 lb (97.5 kg)  03/26/17 208 lb (94.3 kg)  10/18/16 209 lb (94.8 kg)    ------------------------------------------------------------------------  Hypertension, follow-up:  BP Readings from Last 3 Encounters:  07/11/17 122/62  03/26/17 132/68  10/18/16 132/64    She was last seen for hypertension 3 months ago.  BP at that visit was 132/68. Management since that visit includes changed from amlodipine/valsartan to amlodipine/ Losartan. She reports good compliance with treatment. She is not having side effects.  She is not exercising. She is adherent to low salt diet.   Outside blood pressures are not being checked.  Patient denies chest pain, chest pressure/discomfort, claudication, dyspnea, exertional chest pressure/discomfort, fatigue, irregular heart beat, lower extremity edema, near-syncope, orthopnea, palpitations, paroxysmal nocturnal dyspnea, syncope and tachypnea.   Cardiovascular risk  factors include diabetes mellitus, dyslipidemia, hypertension and obesity (BMI >= 30 kg/m2).   Wt Readings from Last 3 Encounters:  07/11/17 215 lb (97.5 kg)  03/26/17 208 lb (94.3 kg)  10/18/16 209 lb (94.8 kg)   ------------------------------------------------------------------------       No Known Allergies   Current Outpatient Medications:  .  amLODipine (NORVASC) 10 MG tablet, Take 1 tablet (10 mg total) by mouth daily., Disp: 90 tablet, Rfl: 3 .  aspirin EC 81 MG tablet, Take 81 mg by mouth daily., Disp: , Rfl:  .  BYSTOLIC 20 MG TABS, TAKE 1 TABLET BY MOUTH  DAILY, Disp: 90 tablet, Rfl: 3 .  cyanocobalamin (,VITAMIN B-12,) 1000 MCG/ML injection, INJECT 1ML IM ONCE WEEKLY., Disp: 10 mL, Rfl: 12 .  fluticasone (FLONASE) 50 MCG/ACT nasal spray, Place into the nose., Disp: , Rfl:  .  glimepiride (AMARYL) 4 MG tablet, Take 1 tablet (4 mg total) by mouth 2 (two) times daily., Disp: 120 tablet, Rfl: 3 .  Lancets (ONETOUCH ULTRASOFT) lancets, Check sugar once daily DX E11.9, Disp: 30 each, Rfl: 12 .  losartan (COZAAR) 100 MG tablet, Take 1 tablet (100 mg total) by mouth daily., Disp: 90 tablet, Rfl: 3 .  SYNTHROID 200 MCG tablet, TAKE 1 TABLET BY MOUTH  DAILY BEFORE BREAKFAST, Disp: 90 tablet, Rfl: 3 .  Syringe/Needle, Disp, (SYRINGE 3CC/25GX1") 25G X 1" 3 ML MISC, 1 mL by Does not apply route every 30 (thirty) days., Disp: 12 each, Rfl: 1 .  Blood Glucose Monitoring Suppl (ONE TOUCH ULTRA SYSTEM KIT) w/Device KIT, Check sugar once daily.  DX E11.9 (Patient not taking: Reported on 07/11/2017), Disp: 1 each, Rfl: 0 .  meloxicam (MOBIC) 7.5 MG tablet, TAKE 1 TABLET BY MOUTH 2 TIMES A DAY WITH A MEAL, Disp: , Rfl: 1 .  NON FORMULARY, CPAP AT BEDTIME, Disp: , Rfl:  .  ONE TOUCH ULTRA TEST test strip, Check sugar once daily. DX E11.9, Disp: 30 each, Rfl: 12 .  sitaGLIPtin-metformin (JANUMET) 50-1000 MG tablet, Take 1 tablet by mouth 2 (two) times daily with a meal. (Patient not taking: Reported  on 07/11/2017), Disp: 180 tablet, Rfl: 3  Review of Systems  Constitutional: Negative.   HENT: Negative.   Eyes: Negative.   Respiratory: Negative.   Cardiovascular: Negative.   Gastrointestinal: Negative.   Endocrine: Negative.   Genitourinary: Negative.   Musculoskeletal: Negative.   Skin: Negative.   Allergic/Immunologic: Negative.   Neurological: Negative.   Hematological: Negative.   Psychiatric/Behavioral: Negative.     Social History   Tobacco Use  . Smoking status: Never Smoker  . Smokeless tobacco: Never Used  Substance Use Topics  . Alcohol use: No   Objective:   BP 122/62 (BP Location: Left Arm, Patient Position: Sitting, Cuff Size: Large)   Pulse 74   Temp 98.4 F (36.9 C) (Oral)   Resp 16   Wt 215 lb (97.5 kg)   BMI 38.09 kg/m  Vitals:   07/11/17 1348  BP: 122/62  Pulse: 74  Resp: 16  Temp: 98.4 F (36.9 C)  TempSrc: Oral  Weight: 215 lb (97.5 kg)     Physical Exam  Constitutional: She is oriented to person, place, and time. She appears well-developed and well-nourished.  HENT:  Head: Normocephalic and atraumatic.  Right Ear: External ear normal.  Left Ear: External ear normal.  Nose: Nose normal.  Eyes: Conjunctivae and EOM are normal. Pupils are equal, round, and reactive to light.  Neck: Normal range of motion. Neck supple.  Cardiovascular: Normal rate, regular rhythm, normal heart sounds and intact distal pulses.  Pulmonary/Chest: Effort normal and breath sounds normal.  Abdominal: Soft.  Musculoskeletal: Normal range of motion.  Neurological: She is alert and oriented to person, place, and time. She has normal reflexes.  Skin: Skin is warm and dry.  Psychiatric: She has a normal mood and affect. Her behavior is normal. Judgment and thought content normal.        Assessment & Plan:     1. Type 2 diabetes mellitus without complication, unspecified whether long term insulin use ([place]) Pt aware she needs to work on her D and E  habits.Pt had GI side effects with Victoza/Bydureon and coould not tolerate Invokana.  - POCT HgB A1C 11.0 today. Start lantus and stop Glimepiride. Increase by 2 units every 3 days that your blood sugar is above 200. Check bloos sugar daily and follow up in 2-4 weeks.   - Insulin Glargine (LANTUS SOLOSTAR) 100 UNIT/ML Solostar Pen; Inject 30 Units into the skin daily at 10 pm.  Dispense: 5 pen; Refill: 11 2.Morbid Obesty 3.CAD All risk factors treated. 4.h/o Breast Cancer 5.HTN 6.HLD Check lipids with LDL goal less than 70.      I have done the exam and reviewed the chart and it is accurate to the best of my knowledge. Development worker, community has been used and  any errors in dictation or transcription are unintentional. Miguel Aschoff M.D. [facility]  HPI, Exam, and A&P Transcribed under the direction and in the presence of Denarius Sesler  Maceo Pro, MD  Electronically Signed: Katina Dung, [place], MD  [facility]

## 2017-07-16 ENCOUNTER — Telehealth: Payer: Self-pay | Admitting: Family Medicine

## 2017-07-16 MED ORDER — INSULIN PEN NEEDLE 32G X 4 MM MISC: 3 refills | Status: DC

## 2017-07-16 NOTE — Telephone Encounter (Signed)
Michele [name], RMA  

## 2017-07-16 NOTE — Telephone Encounter (Signed)
Patient states that you sent in insulin for her but did not send in for needles.  She needs you to send in a prescription for needles to [facility].

## 2017-08-01 ENCOUNTER — Ambulatory Visit (INDEPENDENT_AMBULATORY_CARE_PROVIDER_SITE_OTHER): Payer: 59 | Admitting: Family Medicine

## 2017-08-01 ENCOUNTER — Encounter: Payer: Self-pay | Admitting: Family Medicine

## 2017-08-01 VITALS — BP 130/60 | HR 54 | Temp 98.4°F | Resp 16 | Wt 210.0 lb

## 2017-08-01 DIAGNOSIS — I10 Essential (primary) hypertension: Secondary | ICD-10-CM

## 2017-08-01 DIAGNOSIS — E7849 Other hyperlipidemia: Secondary | ICD-10-CM | POA: Diagnosis not present

## 2017-08-01 DIAGNOSIS — E119 Type 2 diabetes mellitus without complications: Secondary | ICD-10-CM

## 2017-08-01 MED ORDER — ROSUVASTATIN CALCIUM 5 MG PO TABS: 5.0000 mg | ORAL_TABLET | Freq: Every day | ORAL | 11 refills | Status: DC

## 2017-08-01 NOTE — Progress Notes (Signed)
Patient: Michele [name] Female    DOB: 02/01/1956   62 y.o.   MRN: [id number] Visit Date: 08/01/2017  Today's Provider: [name], MD   Chief Complaint  Patient presents with  . Diabetes   Subjective:    HPI  Diabetes Mellitus Type II, Follow-up:   Lab Results  Component Value Date   HGBA1C 11.0 07/11/2017   HGBA1C 10.8 03/26/2017   HGBA1C 9.1 10/18/2016    Last seen for diabetes 2 weeks ago.  Management since then includes started on Lantus and stopped Glimepiride. She reports good compliance with treatment. She is not having side effects.  Home blood sugar records: mid 300's- 400  Episodes of hypoglycemia? no   Current Insulin Regimen: Lantus 18 units but will start 20 units tonight per instructions at last OV.  Current exercise: none  Pertinent Labs:    Component Value Date/Time   CHOL 188 10/18/2016 1432   TRIG 367 (H) 10/18/2016 1432   HDL 30 (L) 10/18/2016 1432   LDLCALC 85 10/18/2016 1432   CREATININE 0.74 10/18/2016 1432    Wt Readings from Last 3 Encounters:  08/01/17 210 lb (95.3 kg)  07/11/17 215 lb (97.5 kg)  03/26/17 208 lb (94.3 kg)    ------------------------------------------------------------------------     No Known Allergies   Current Outpatient Medications:  .  amLODipine (NORVASC) 10 MG tablet, Take 1 tablet (10 mg total) by mouth daily., Disp: 90 tablet, Rfl: 3 .  aspirin EC 81 MG tablet, Take 81 mg by mouth daily., Disp: , Rfl:  .  Blood Glucose Monitoring Suppl (ONE TOUCH ULTRA SYSTEM KIT) w/Device KIT, Check sugar once daily. DX E11.9, Disp: 1 each, Rfl: 0 .  BYSTOLIC 20 MG TABS, TAKE 1 TABLET BY MOUTH  DAILY, Disp: 90 tablet, Rfl: 3 .  cyanocobalamin (,VITAMIN B-12,) 1000 MCG/ML injection, INJECT 1ML IM ONCE WEEKLY., Disp: 10 mL, Rfl: 12 .  fluticasone (FLONASE) 50 MCG/ACT nasal spray, Place into the nose., Disp: , Rfl:  .  Insulin Glargine (LANTUS SOLOSTAR) 100 UNIT/ML Solostar Pen, Inject 30 Units into  the skin daily at 10 pm., Disp: 5 pen, Rfl: 11 .  Insulin Pen Needle 32G X 4 MM MISC, Inject once daily, SQ. DX E11.9, Disp: 100 each, Rfl: 3 .  Lancets (ONETOUCH ULTRASOFT) lancets, Check sugar once daily DX E11.9, Disp: 30 each, Rfl: 12 .  losartan (COZAAR) 100 MG tablet, Take 1 tablet (100 mg total) by mouth daily., Disp: 90 tablet, Rfl: 3 .  meloxicam (MOBIC) 7.5 MG tablet, TAKE 1 TABLET BY MOUTH 2 TIMES A DAY WITH A MEAL, Disp: , Rfl: 1 .  NON FORMULARY, CPAP AT BEDTIME, Disp: , Rfl:  .  ONE TOUCH ULTRA TEST test strip, Check sugar once daily. DX E11.9, Disp: 30 each, Rfl: 12 .  sitaGLIPtin-metformin (JANUMET) 50-1000 MG tablet, Take 1 tablet by mouth 2 (two) times daily with a meal., Disp: 180 tablet, Rfl: 3 .  SYNTHROID 200 MCG tablet, TAKE 1 TABLET BY MOUTH  DAILY BEFORE BREAKFAST, Disp: 90 tablet, Rfl: 3 .  Syringe/Needle, Disp, (SYRINGE 3CC/25GX1") 25G X 1" 3 ML MISC, 1 mL by Does not apply route every 30 (thirty) days., Disp: 12 each, Rfl: 1 .  glimepiride (AMARYL) 4 MG tablet, Take 1 tablet (4 mg total) by mouth 2 (two) times daily. (Patient not taking: Reported on 08/01/2017), Disp: 120 tablet, Rfl: 3  Review of Systems  Constitutional: Negative.   HENT: Negative.  Eyes: Negative.   Respiratory: Negative.   Cardiovascular: Negative.   Gastrointestinal: Negative.   Endocrine: Negative.   Genitourinary: Negative.   Musculoskeletal: Negative.   Skin: Negative.   Allergic/Immunologic: Negative.   Neurological: Negative.   Hematological: Negative.   Psychiatric/Behavioral: Negative.     Social History   Tobacco Use  . Smoking status: Never Smoker  . Smokeless tobacco: Never Used  Substance Use Topics  . Alcohol use: No   Objective:   BP 130/60 (BP Location: Left Arm, Patient Position: Sitting, Cuff Size: Normal)   Pulse (!) 54   Temp 98.4 F (36.9 C) (Oral)   Resp 16   Wt 210 lb (95.3 kg)   BMI 37.20 kg/m  Vitals:   08/01/17 1546  BP: 130/60  Pulse: (!) 54    Resp: 16  Temp: 98.4 F (36.9 C)  TempSrc: Oral  Weight: 210 lb (95.3 kg)     Physical Exam  Constitutional: She is oriented to person, place, and time. She appears well-developed and well-nourished.  HENT:  Head: Normocephalic and atraumatic.  Right Ear: External ear normal.  Left Ear: External ear normal.  Nose: Nose normal.  Mouth/Throat: Oropharynx is clear and moist.  Eyes: Conjunctivae are normal. No scleral icterus.  Neck: No thyromegaly present.  Cardiovascular: Normal rate, regular rhythm and normal heart sounds.  Pulmonary/Chest: Effort normal and breath sounds normal.  Abdominal: Soft.  Neurological: She is alert and oriented to person, place, and time.  Skin: Skin is warm and dry.  Psychiatric: She has a normal mood and affect. Her behavior is normal. Judgment and thought content normal.        Assessment & Plan:     1. Essential hypertension   2. Type 2 diabetes mellitus without complication, unspecified whether long term insulin use (HCC)   3. Other hyperlipidemia Try to get LDLC less than 70. - rosuvastatin (CRESTOR) 5 MG tablet; Take 1 tablet (5 mg total) by mouth daily.  Dispense: 30 tablet; Refill: 11 4.CAD 5.obesity I have done the exam and reviewed the chart and it is accurate to the best of my knowledge. Development worker, community has been used and  any errors in dictation or transcription are unintentional. Miguel Aschoff M.D. [place], [place]   [facility]

## 2017-08-19 ENCOUNTER — Other Ambulatory Visit: Payer: Self-pay | Admitting: Family Medicine

## 2017-08-19 DIAGNOSIS — E119 Type 2 diabetes mellitus without complications: Secondary | ICD-10-CM

## 2017-08-27 ENCOUNTER — Other Ambulatory Visit: Payer: Self-pay

## 2017-08-27 DIAGNOSIS — E119 Type 2 diabetes mellitus without complications: Secondary | ICD-10-CM

## 2017-08-27 MED ORDER — INSULIN GLARGINE 100 UNIT/ML SOLOSTAR PEN: 30.0000 [IU] | PEN_INJECTOR | Freq: Every day | SUBCUTANEOUS | 11 refills | Status: DC

## 2017-08-27 NOTE — Telephone Encounter (Signed)
Patient called to report that she is tolerating Lantus well. Patient reports she is up to 42 units and FBS is still over 300. Patient is requesting refill to be sent to [facility] [address]

## 2017-08-29 MED ORDER — INSULIN GLARGINE 100 UNIT/ML SOLOSTAR PEN: 30.0000 [IU] | PEN_INJECTOR | Freq: Every day | SUBCUTANEOUS | 11 refills | Status: DC

## 2017-08-29 NOTE — Telephone Encounter (Signed)
rx was sent to the wrong pharmacy the first time resent it to the correct pharmacy.

## 2017-08-29 NOTE — Addendum Note (Signed)
Addended by: Ermalinda Barrios on: 08/29/2017 09:49 AM   Modules accepted: Orders

## 2017-08-30 ENCOUNTER — Telehealth: Payer: Self-pay | Admitting: Emergency Medicine

## 2017-08-30 DIAGNOSIS — E119 Type 2 diabetes mellitus without complications: Secondary | ICD-10-CM

## 2017-08-30 MED ORDER — INSULIN GLARGINE 100 UNIT/ML SOLOSTAR PEN: 45.0000 [IU] | PEN_INJECTOR | Freq: Every day | SUBCUTANEOUS | 11 refills | Status: DC

## 2017-08-30 NOTE — Telephone Encounter (Signed)
Error

## 2017-10-01 ENCOUNTER — Telehealth: Payer: Self-pay | Admitting: Family Medicine

## 2017-10-01 NOTE — Telephone Encounter (Signed)
Pt states her fasting BC is 250-300 with 66 units of Lantus.  Pt states she is needing a stronger dosages called into the pharmacy.

## 2017-10-02 NOTE — Telephone Encounter (Signed)
Please advise. Thanks.  

## 2017-10-03 ENCOUNTER — Other Ambulatory Visit: Payer: Self-pay | Admitting: Family Medicine

## 2017-10-03 DIAGNOSIS — E119 Type 2 diabetes mellitus without complications: Secondary | ICD-10-CM

## 2017-10-03 MED ORDER — INSULIN GLARGINE 100 UNIT/ML SOLOSTAR PEN: 70.0000 [IU] | PEN_INJECTOR | Freq: Every day | SUBCUTANEOUS | 11 refills | Status: DC

## 2017-10-03 NOTE — Telephone Encounter (Signed)
Increase to 70 units--appt in next month.

## 2017-10-03 NOTE — Telephone Encounter (Signed)
Patient has increased Lantus dosing and needs more [facility] [address]

## 2017-10-20 ENCOUNTER — Other Ambulatory Visit: Payer: Self-pay | Admitting: Family Medicine

## 2017-10-20 DIAGNOSIS — E119 Type 2 diabetes mellitus without complications: Secondary | ICD-10-CM

## 2017-11-06 ENCOUNTER — Encounter: Payer: Self-pay | Admitting: Family Medicine

## 2017-11-06 ENCOUNTER — Ambulatory Visit (INDEPENDENT_AMBULATORY_CARE_PROVIDER_SITE_OTHER): Payer: 59 | Admitting: Family Medicine

## 2017-11-06 ENCOUNTER — Telehealth: Payer: Self-pay | Admitting: Family Medicine

## 2017-11-06 VITALS — BP 120/76 | HR 72 | Temp 99.2°F | Resp 16 | Wt 208.0 lb

## 2017-11-06 DIAGNOSIS — I10 Essential (primary) hypertension: Secondary | ICD-10-CM

## 2017-11-06 DIAGNOSIS — D0511 Intraductal carcinoma in situ of right breast: Secondary | ICD-10-CM

## 2017-11-06 DIAGNOSIS — I2510 Atherosclerotic heart disease of native coronary artery without angina pectoris: Secondary | ICD-10-CM | POA: Diagnosis not present

## 2017-11-06 DIAGNOSIS — E0800 Diabetes mellitus due to underlying condition with hyperosmolarity without nonketotic hyperglycemic-hyperosmolar coma (NKHHC): Secondary | ICD-10-CM

## 2017-11-06 DIAGNOSIS — E7849 Other hyperlipidemia: Secondary | ICD-10-CM | POA: Diagnosis not present

## 2017-11-06 DIAGNOSIS — E119 Type 2 diabetes mellitus without complications: Secondary | ICD-10-CM

## 2017-11-06 DIAGNOSIS — Z794 Long term (current) use of insulin: Secondary | ICD-10-CM

## 2017-11-06 LAB — POCT GLYCOSYLATED HEMOGLOBIN (HGB A1C)

## 2017-11-06 MED ORDER — INSULIN ISOPHANE & REGULAR (HUMAN 70-30)100 UNIT/ML KWIKPEN: 40.0000 [IU] | PEN_INJECTOR | Freq: Two times a day (BID) | SUBCUTANEOUS | 11 refills | Status: DC

## 2017-11-06 MED ORDER — GLIMEPIRIDE 4 MG PO TABS: 4.0000 mg | ORAL_TABLET | Freq: Two times a day (BID) | ORAL | 3 refills | Status: DC

## 2017-11-06 NOTE — Telephone Encounter (Signed)
OK 

## 2017-11-06 NOTE — Telephone Encounter (Signed)
Pt states the Novolog was called into her mail order pharmacy.  States she would like to have it cxed and [facility] [address] [facility].  States since we are having to change it monthly it is easier for her to keep out with at her local pharmacy.

## 2017-11-06 NOTE — Progress Notes (Signed)
Patient: Michele [name] Female    DOB: 1955/08/23   62 y.o.   MRN: [id number] Visit Date: 11/06/2017  Today's Provider: [name], MD   I, Martha Clan, CMA, am acting as scribe for Miguel Aschoff, MD.  Chief Complaint  Patient presents with  . Diabetes  . Hyperlipidemia   Subjective:    HPI      Diabetes Mellitus Type II, Follow-up:   Lab Results  Component Value Date   HGBA1C 11.0 07/11/2017   HGBA1C 10.8 03/26/2017   HGBA1C 9.1 10/18/2016    Last seen for diabetes 3 months ago.  Management since then includes increasing Lantus to 70 IU daily. States she has been slowly increasing this. She is now taking 80 IU daily. She reports excellent compliance with treatment. She is not having side effects.  Current symptoms include polydipsia and have been stable. Home blood sugar records: fasting range: 250-300  Episodes of hypoglycemia? no   Current Insulin Regimen: Lantus 60 units  Most Recent Eye Exam: December- negative Weight trend: stable Current diet: "not as good as it could be", but is unchanged Current exercise: is exercising more now that it is spring, and she gardens  Pertinent Labs:    Component Value Date/Time   CHOL 188 10/18/2016 1432   TRIG 367 (H) 10/18/2016 1432   HDL 30 (L) 10/18/2016 1432   LDLCALC 85 10/18/2016 1432   CREATININE 0.74 10/18/2016 1432    Wt Readings from Last 3 Encounters:  11/06/17 208 lb (94.3 kg)  08/01/17 210 lb (95.3 kg)  07/11/17 215 lb (97.5 kg)    ------------------------------------------------------------------------  Follow up for Hyperlipidemia  The patient was last seen for this 3 months ago. Changes made at last visit include starting Crestor 5 mg.  She reports excellent compliance with treatment. She is having side effects. Slight leg cramps. Pt states she is not concerned about this.  ------------------------------------------------------------------------------------    No  Known Allergies   Current Outpatient Medications:  .  amLODipine (NORVASC) 10 MG tablet, Take 1 tablet (10 mg total) by mouth daily., Disp: 90 tablet, Rfl: 3 .  Blood Glucose Monitoring Suppl (ONE TOUCH ULTRA SYSTEM KIT) w/Device KIT, Check sugar once daily. DX E11.9, Disp: 1 each, Rfl: 0 .  BYSTOLIC 20 MG TABS, TAKE 1 TABLET BY MOUTH  DAILY, Disp: 90 tablet, Rfl: 3 .  cyanocobalamin (,VITAMIN B-12,) 1000 MCG/ML injection, INJECT 1ML IM ONCE WEEKLY., Disp: 10 mL, Rfl: 12 .  fluticasone (FLONASE) 50 MCG/ACT nasal spray, Place into the nose., Disp: , Rfl:  .  Insulin Glargine (LANTUS SOLOSTAR) 100 UNIT/ML Solostar Pen, Inject 70 Units into the skin daily at 10 pm. (Patient taking differently: Inject 80 Units into the skin daily at 10 pm. ), Disp: 10 pen, Rfl: 11 .  Insulin Pen Needle 32G X 4 MM MISC, Inject once daily, SQ. DX E11.9, Disp: 100 each, Rfl: 3 .  Lancets (ONETOUCH ULTRASOFT) lancets, Check sugar once daily DX E11.9, Disp: 30 each, Rfl: 12 .  losartan (COZAAR) 100 MG tablet, Take 1 tablet (100 mg total) by mouth daily., Disp: 90 tablet, Rfl: 3 .  meloxicam (MOBIC) 7.5 MG tablet, TAKE 1 TABLET BY MOUTH 2 TIMES A DAY WITH A MEAL, Disp: , Rfl: 1 .  NON FORMULARY, CPAP AT BEDTIME, Disp: , Rfl:  .  ONE TOUCH ULTRA TEST test strip, CHECK SUGAR ONCE DAILY. DX E11.9, Disp: 100 each, Rfl: 3 .  rosuvastatin (CRESTOR) 5  MG tablet, Take 1 tablet (5 mg total) by mouth daily., Disp: 30 tablet, Rfl: 11 .  SYNTHROID 200 MCG tablet, TAKE 1 TABLET BY MOUTH  DAILY BEFORE BREAKFAST, Disp: 90 tablet, Rfl: 3 .  Syringe/Needle, Disp, (SYRINGE 3CC/25GX1") 25G X 1" 3 ML MISC, 1 mL by Does not apply route every 30 (thirty) days., Disp: 12 each, Rfl: 1 .  aspirin EC 81 MG tablet, Take 81 mg by mouth daily., Disp: , Rfl:   Review of Systems  Constitutional: Negative for activity change, appetite change, chills, diaphoresis, fatigue, fever and unexpected weight change.  HENT: Negative.   Eyes: Negative.   Negative for visual disturbance.  Respiratory: Negative for shortness of breath.   Cardiovascular: Positive for palpitations (baseline). Negative for chest pain and leg swelling.  Endocrine: Positive for polydipsia. Negative for polyphagia and polyuria.  Allergic/Immunologic: Negative.   Neurological: Negative.   Psychiatric/Behavioral: Negative.     Social History   Tobacco Use  . Smoking status: Never Smoker  . Smokeless tobacco: Never Used  Substance Use Topics  . Alcohol use: No   Objective:   BP 120/76 (BP Location: Right Arm, Patient Position: Sitting, Cuff Size: Large)   Pulse 72   Temp 99.2 F (37.3 C) (Oral)   Resp 16   Wt 208 lb (94.3 kg)   BMI 36.85 kg/m  Vitals:   11/06/17 1336  BP: 120/76  Pulse: 72  Resp: 16  Temp: 99.2 F (37.3 C)  TempSrc: Oral  Weight: 208 lb (94.3 kg)     Physical Exam  Constitutional: She is oriented to person, place, and time. She appears well-developed and well-nourished.  HENT:  Head: Normocephalic and atraumatic.  Neck: No thyromegaly present.  Cardiovascular: Normal rate, regular rhythm and normal heart sounds.  Pulmonary/Chest: Effort normal and breath sounds normal.  Abdominal: Soft.  Musculoskeletal: She exhibits no edema.  Neurological: She is alert and oriented to person, place, and time.  Skin: Skin is warm and dry.  Psychiatric: She has a normal mood and affect. Her behavior is normal. Judgment and thought content normal.        Assessment & Plan:     1. Diabetes mellitus due to underlying condition with hyperosmolarity without coma, with long-term current use of insulin (HCC)  - POCT glycosylated hemoglobin (Hb A1C)  2. Type 2 diabetes mellitus without complication, without long-term current use of insulin (HCC)  - glimepiride (AMARYL) 4 MG tablet; Take 1 tablet (4 mg total) by mouth 2 (two) times daily.  Dispense: 180 tablet; Refill: 3 - Insulin Isophane & Regular Human (NOVOLIN 70/30 FLEXPEN) (70-30) 100  UNIT/ML PEN; Inject 40 Units into the skin 2 (two) times daily.  Dispense: 15 mL; Refill: 11 3.CAD All risk factors treated. 4.Breast Cancer 5.Ischemic Cardiomyopathy Pt needs to get back to cardiology.

## 2017-11-06 NOTE — Telephone Encounter (Signed)
Novolin cancelled.

## 2017-11-07 ENCOUNTER — Other Ambulatory Visit: Payer: Self-pay | Admitting: Family Medicine

## 2017-11-07 DIAGNOSIS — E7849 Other hyperlipidemia: Secondary | ICD-10-CM

## 2017-11-07 MED ORDER — ROSUVASTATIN CALCIUM 5 MG PO TABS: 5.0000 mg | ORAL_TABLET | Freq: Every day | ORAL | 11 refills | Status: DC

## 2017-11-07 NOTE — Telephone Encounter (Signed)
[  t called and wants to totally cancel the prescription Hovolin?  She said the oral is working good and she wants to wait until she sees Dr. Rosanna Randy in a couple weeks  Pt's call back I [phone]  Thanks Con Memos

## 2017-11-07 NOTE — Telephone Encounter (Signed)
[facility] faxed refill request for the following medications:  rosuvastatin (CRESTOR) 5 MG tablet  90 day supply  Last Rx: 08/01/17 [facility] LOV: 11/06/17 Please advise. Thanks TNP

## 2017-11-09 ENCOUNTER — Other Ambulatory Visit: Payer: Self-pay

## 2017-11-09 DIAGNOSIS — Z1231 Encounter for screening mammogram for malignant neoplasm of breast: Secondary | ICD-10-CM

## 2017-11-12 ENCOUNTER — Telehealth: Payer: Self-pay | Admitting: Family Medicine

## 2017-11-12 DIAGNOSIS — E039 Hypothyroidism, unspecified: Secondary | ICD-10-CM

## 2017-11-12 MED ORDER — LEVOTHYROXINE SODIUM 200 MCG PO TABS: 200.0000 ug | ORAL_TABLET | Freq: Every day | ORAL | 3 refills | Status: DC

## 2017-11-12 NOTE — Addendum Note (Signed)
Addended by: Althea Charon D on: 11/12/2017 12:00 PM   Modules accepted: Orders

## 2017-11-12 NOTE — Telephone Encounter (Signed)
Pt needs refill on her synthoroid 264mcg 90 days supply  [facility]  [address]

## 2017-11-27 ENCOUNTER — Other Ambulatory Visit: Payer: Self-pay | Admitting: General Surgery

## 2017-11-27 ENCOUNTER — Ambulatory Visit
Admission: RE | Admit: 2017-11-27 | Discharge: 2017-11-27 | Disposition: A | Discharge status: Home / Self Care | Payer: 59 | Source: Ambulatory Visit | Attending: General Surgery | Admitting: General Surgery

## 2017-11-27 DIAGNOSIS — Z1231 Encounter for screening mammogram for malignant neoplasm of breast: Secondary | ICD-10-CM | POA: Insufficient documentation

## 2017-11-29 ENCOUNTER — Other Ambulatory Visit: Payer: Self-pay | Admitting: General Surgery

## 2017-11-29 DIAGNOSIS — R928 Other abnormal and inconclusive findings on diagnostic imaging of breast: Secondary | ICD-10-CM

## 2017-11-29 DIAGNOSIS — N6489 Other specified disorders of breast: Secondary | ICD-10-CM

## 2017-12-04 ENCOUNTER — Ambulatory Visit: Payer: Self-pay | Admitting: Family Medicine

## 2017-12-10 ENCOUNTER — Ambulatory Visit (INDEPENDENT_AMBULATORY_CARE_PROVIDER_SITE_OTHER): Payer: 59 | Admitting: Family Medicine

## 2017-12-10 VITALS — BP 130/80 | HR 80 | Temp 98.9°F | Resp 16 | Wt 213.0 lb

## 2017-12-10 DIAGNOSIS — E118 Type 2 diabetes mellitus with unspecified complications: Secondary | ICD-10-CM | POA: Diagnosis not present

## 2017-12-10 DIAGNOSIS — C50919 Malignant neoplasm of unspecified site of unspecified female breast: Secondary | ICD-10-CM

## 2017-12-10 DIAGNOSIS — I10 Essential (primary) hypertension: Secondary | ICD-10-CM | POA: Diagnosis not present

## 2017-12-10 DIAGNOSIS — E7849 Other hyperlipidemia: Secondary | ICD-10-CM | POA: Diagnosis not present

## 2017-12-10 DIAGNOSIS — Z794 Long term (current) use of insulin: Secondary | ICD-10-CM

## 2017-12-10 DIAGNOSIS — E119 Type 2 diabetes mellitus without complications: Secondary | ICD-10-CM | POA: Diagnosis not present

## 2017-12-10 DIAGNOSIS — M109 Gout, unspecified: Secondary | ICD-10-CM | POA: Diagnosis not present

## 2017-12-10 DIAGNOSIS — I2510 Atherosclerotic heart disease of native coronary artery without angina pectoris: Secondary | ICD-10-CM

## 2017-12-10 MED ORDER — GLUCOSE BLOOD VI STRP: ORAL_STRIP | 3 refills | Status: DC

## 2017-12-10 MED ORDER — INSULIN PEN NEEDLE 32G X 4 MM MISC: 3 refills | Status: DC

## 2017-12-10 MED ORDER — ALLOPURINOL 300 MG PO TABS: 300.0000 mg | ORAL_TABLET | Freq: Every day | ORAL | 3 refills | Status: DC

## 2017-12-10 MED ORDER — ONETOUCH ULTRASOFT LANCETS MISC: 3 refills | Status: DC

## 2017-12-10 MED ORDER — INSULIN GLARGINE 100 UNIT/ML SOLOSTAR PEN: 45.0000 [IU] | PEN_INJECTOR | Freq: Two times a day (BID) | SUBCUTANEOUS | 11 refills | Status: DC

## 2017-12-10 NOTE — Progress Notes (Signed)
MIAKODA MCMILLION  MRN: [id number] DOB: 1956-02-17  Subjective:  HPI   The patient is a 62 year old female who presents for follow up o f her diabetes.  She was last seen on 11/06/17 and her A1C was greater than 14.  She has been checking her glucose and has been getting readings that range 160-260 with a rare one lower.   She did have medication changes and is currently on Glimeperide BID and Lantus 80 units.  The patient states she needs to restart her Allopurinol.  She had stopped it about 3 years ago and had a flare of gout about a week ago.  Patient Active Problem List   Diagnosis Date Noted  . Fat necrosis (segmental) of breast 12/30/2015  . Allergic rhinitis 11/28/2014  . Acid reflux 11/28/2014  . Adult hypothyroidism 11/28/2014  . DCIS (ductal carcinoma in situ) 06/03/2014  . Diabetes mellitus ([place]) 03/14/2012  . Shortness of breath 12/13/2011  . CAD (coronary artery disease) 12/13/2011  . Obesity 12/13/2011  . Hyperlipidemia 12/13/2011  . Hypertension 12/13/2011    Past Medical History:  Diagnosis Date  . Abscess of breast, right 08/19/2012  . Breast cancer ([place]) 07/13/2010   1.5 cm,intermediate grade DCIS, nuclear grade 2, ER 90%, PR 90% treated with wide excision, reexcision to negative margins and MammoSite partial breast radiation.  . CHF (congestive heart failure) ([place])   . Coronary artery disease 2009  . DCIS (ductal carcinoma in situ) of breast, right 2012  . Diabetes mellitus ([place])    Type II  . GERD (gastroesophageal reflux disease)   . Hyperlipidemia   . Hypertension   . Ischemic cardiomyopathy   . Obesity, unspecified   . Sleep apnea   . Thyroid disease    hypothyroidism  . Umbilical hernia without mention of obstruction or gangrene     Social History   Socioeconomic History  . Marital status: Married    Spouse name: Ludwig Clarks  . Number of children: 0  . Years of education: Not on file  . Highest education level: Not on file  Occupational  History  . Occupation: retired    Fish farm manager: [employer]  Social Needs  . Financial resource strain: Not on file  . Food insecurity:    Worry: Not on file    Inability: Not on file  . Transportation needs:    Medical: Not on file    Non-medical: Not on file  Tobacco Use  . Smoking status: Never Smoker  . Smokeless tobacco: Never Used  Substance and Sexual Activity  . Alcohol use: No  . Drug use: No  . Sexual activity: Not on file  Lifestyle  . Physical activity:    Days per week: Not on file    Minutes per session: Not on file  . Stress: Not on file  Relationships  . Social connections:    Talks on phone: Not on file    Gets together: Not on file    Attends religious service: Not on file    Active member of club or organization: Not on file    Attends meetings of clubs or organizations: Not on file    Relationship status: Not on file  . Intimate partner violence:    Fear of current or ex partner: Not on file    Emotionally abused: Not on file    Physically abused: Not on file    Forced sexual activity: Not on file  Other Topics Concern  . Not on  file  Social History Narrative  . Not on file    Outpatient Encounter Medications as of 12/10/2017  Medication Sig Note  . amLODipine (NORVASC) 10 MG tablet Take 1 tablet (10 mg total) by mouth daily.   Marland Kitchen aspirin EC 81 MG tablet Take 81 mg by mouth daily.   . Blood Glucose Monitoring Suppl (ONE TOUCH ULTRA SYSTEM KIT) w/Device KIT Check sugar once daily. DX Q76.1   . BYSTOLIC 20 MG TABS TAKE 1 TABLET BY MOUTH  DAILY   . cyanocobalamin (,VITAMIN B-12,) 1000 MCG/ML injection INJECT 1ML IM ONCE WEEKLY.   . fluticasone (FLONASE) 50 MCG/ACT nasal spray Place into the nose. 11/28/2014: Medication taken as needed. GENERIC OK Received from: [facility]  . glimepiride (AMARYL) 4 MG tablet Take 1 tablet (4 mg total) by mouth 2 (two) times daily.   . Insulin Glargine (LANTUS SOLOSTAR) 100 UNIT/ML Solostar Pen Inject 70 Units  into the skin daily at 10 pm. (Patient taking differently: Inject 80 Units into the skin daily at 10 pm. )   . Insulin Isophane & Regular Human (NOVOLIN 70/30 FLEXPEN) (70-30) 100 UNIT/ML PEN Inject 40 Units into the skin 2 (two) times daily.   . Insulin Pen Needle 32G X 4 MM MISC Inject once daily, SQ. DX E11.9   . Lancets (ONETOUCH ULTRASOFT) lancets Check sugar once daily DX E11.9   . levothyroxine (SYNTHROID) 200 MCG tablet Take 1 tablet (200 mcg total) by mouth daily before breakfast.   . losartan (COZAAR) 100 MG tablet Take 1 tablet (100 mg total) by mouth daily.   . meloxicam (MOBIC) 7.5 MG tablet TAKE 1 TABLET BY MOUTH 2 TIMES A DAY WITH A MEAL 06/01/2015: Received from: External Pharmacy  . NON FORMULARY CPAP AT BEDTIME   . ONE TOUCH ULTRA TEST test strip CHECK SUGAR ONCE DAILY. DX E11.9   . rosuvastatin (CRESTOR) 5 MG tablet Take 1 tablet (5 mg total) by mouth daily.   . Syringe/Needle, Disp, (SYRINGE 3CC/25GX1") 25G X 1" 3 ML MISC 1 mL by Does not apply route every 30 (thirty) days.    No facility-administered encounter medications on file as of 12/10/2017.     No Known Allergies  Review of Systems  Constitutional: Negative for fever and malaise/fatigue.  HENT: Negative.   Eyes: Negative.   Respiratory: Negative for cough, shortness of breath and wheezing.   Cardiovascular: Positive for palpitations (chronic). Negative for chest pain, orthopnea, claudication and leg swelling.  Gastrointestinal: Negative.   Musculoskeletal: Positive for joint pain.  Skin: Negative.   Neurological: Negative.   Endo/Heme/Allergies: Negative.   Psychiatric/Behavioral: Negative.     Objective:  BP 130/80 (BP Location: Right Arm, Patient Position: Sitting, Cuff Size: Normal)   Pulse 80   Temp 98.9 F (37.2 C) (Oral)   Resp 16   Wt 213 lb (96.6 kg)   BMI 37.73 kg/m   Physical Exam  Constitutional: She is oriented to person, place, and time and well-developed, well-nourished, and in no  distress.  HENT:  Head: Normocephalic and atraumatic.  Right Ear: External ear normal.  Left Ear: External ear normal.  Nose: Nose normal.  Eyes: Conjunctivae are normal. No scleral icterus.  Neck: No thyromegaly present.  Cardiovascular: Normal rate, regular rhythm, normal heart sounds and intact distal pulses.  Pulmonary/Chest: Effort normal and breath sounds normal.  Abdominal: Soft.  Musculoskeletal: She exhibits no edema or deformity.  Lymphadenopathy:    She has no cervical adenopathy.  Neurological: She is alert  and oriented to person, place, and time. Gait normal. GCS score is 15.  Skin: Skin is warm and dry.  Psychiatric: Mood, memory, affect and judgment normal.    Assessment and Plan :  1. Type 2 diabetes mellitus without complication, unspecified whether long term insulin use ([place])   2. Type 2 diabetes mellitus without complication, without long-term current use of insulin ([facility]) Change Lantus 8units daily to 45 units BID. Work on lifestyle. - Insulin Glargine (LANTUS SOLOSTAR) 100 UNIT/ML Solostar Pen; Inject 45 Units into the skin 2 (two) times daily.  Dispense: 10 pen; Refill: 11 - Insulin Pen Needle 32G X 4 MM MISC; Inject twice daily, SQ. DX E11.9  Dispense: 200 each; Refill: 3 - Lancets (ONETOUCH ULTRASOFT) lancets; Check sugar once daily DX E11.9  Dispense: 100 each; Refill: 3 - glucose blood (ONE TOUCH ULTRA TEST) test strip; Use as instructed  Dispense: 100 each; Refill: 3  3. Gout, unspecified cause, unspecified chronicity, unspecified site  - allopurinol (ZYLOPRIM) 300 MG tablet; Take 1 tablet (300 mg total) by mouth daily.  Dispense: 90 tablet; Refill: 3 4.Obesity 5.CAD 6.h/o CHF  I have done the exam and reviewed the chart and it is accurate to the best of my knowledge. Development worker, community has been used and  any errors in dictation or transcription are unintentional. Miguel Aschoff M.D. [facility]

## 2017-12-13 ENCOUNTER — Ambulatory Visit (INDEPENDENT_AMBULATORY_CARE_PROVIDER_SITE_OTHER): Payer: 59 | Admitting: General Surgery

## 2017-12-13 ENCOUNTER — Encounter: Payer: Self-pay | Admitting: General Surgery

## 2017-12-13 VITALS — BP 130/78 | HR 78 | Resp 14 | Ht 63.0 in | Wt 211.0 lb

## 2017-12-13 DIAGNOSIS — Z853 Personal history of malignant neoplasm of breast: Secondary | ICD-10-CM | POA: Diagnosis not present

## 2017-12-13 NOTE — Progress Notes (Signed)
Patient ID: Michele [name], female   DOB: Jul 13, 1955, 62 y.o.   MRN: [id number]  Chief Complaint  Patient presents with  . Follow-up    HPI Michele [name] is a 62 y.o. female who presents for a breast evaluation. The most recent mammogram was done on 11/27/2017. She was recommended for additional imaging of the right breast. She has not scheduled this yet. Patient does perform regular self breast checks and gets regular mammograms done. She reports no new problems with the breasts.    HPI  Past Medical History:  Diagnosis Date  . Abscess of breast, right 08/19/2012  . Breast cancer ([place]) 07/13/2010   1.5 cm,intermediate grade DCIS, nuclear grade 2, ER 90%, PR 90% treated with wide excision, reexcision to negative margins and MammoSite partial breast radiation.  . CHF (congestive heart failure) ([place])   . Coronary artery disease 2009  . DCIS (ductal carcinoma in situ) of breast, right 2012  . Diabetes mellitus ([place])    Type II  . GERD (gastroesophageal reflux disease)   . Hyperlipidemia   . Hypertension   . Ischemic cardiomyopathy   . Obesity, unspecified   . Sleep apnea   . Thyroid disease    hypothyroidism  . Umbilical hernia without mention of obstruction or gangrene     Past Surgical History:  Procedure Laterality Date  . BREAST BIOPSY Right 2007   right bx neg  . BREAST BIOPSY Right 08/10/2016   fat necrosis/ done in Dr. Dwyane Luo office  . BREAST EXCISIONAL BIOPSY Right 2011   DCIS mammosite lumpectomy  . BREAST LUMPECTOMY Right Jan 2012   Wide excision  . BREAST MASS EXCISION Right 2011   December  . CARDIAC CATHETERIZATION  01/08/2008  . CHOLECYSTECTOMY  2013  [facility]  . HERNIA REPAIR  2013   epigastric  . INCISION AND DRAINAGE BREAST ABSCESS Right 08/19/2012  . TONSILLECTOMY AND ADENOIDECTOMY     age 59 yrs    Family History  Problem Relation Age of Onset  . Heart failure Mother   . Diabetes Mother   . Congestive Heart  Failure Mother   . CVA Mother   . Heart attack Father   . Arthritis Father   . Diabetes Sister   . Hypertension Sister   . Lung cancer Sister   . Hypertension Brother   . Hypertension Brother   . Hemochromatosis Brother   . Stroke Maternal Grandmother   . Heart attack Maternal Grandfather   . Heart attack Paternal Grandmother     Social History Social History   Tobacco Use  . Smoking status: Never Smoker  . Smokeless tobacco: Never Used  Substance Use Topics  . Alcohol use: No  . Drug use: No    No Known Allergies  Current Outpatient Medications  Medication Sig Dispense Refill  . allopurinol (ZYLOPRIM) 300 MG tablet Take 1 tablet (300 mg total) by mouth daily. 90 tablet 3  . amLODipine (NORVASC) 10 MG tablet Take 1 tablet (10 mg total) by mouth daily. 90 tablet 3  . aspirin EC 81 MG tablet Take 81 mg by mouth daily.    . Blood Glucose Monitoring Suppl (ONE TOUCH ULTRA SYSTEM KIT) w/Device KIT Check sugar once daily. DX E11.9 1 each 0  . BYSTOLIC 20 MG TABS TAKE 1 TABLET BY MOUTH  DAILY 90 tablet 3  . cyanocobalamin (,VITAMIN B-12,) 1000 MCG/ML injection INJECT 1ML IM ONCE WEEKLY. 10 mL 12  .  fluticasone (FLONASE) 50 MCG/ACT nasal spray Place into the nose.    Marland Kitchen glimepiride (AMARYL) 4 MG tablet Take 1 tablet (4 mg total) by mouth 2 (two) times daily. 180 tablet 3  . glucose blood (ONE TOUCH ULTRA TEST) test strip Use as instructed 100 each 3  . Insulin Glargine (LANTUS SOLOSTAR) 100 UNIT/ML Solostar Pen Inject 45 Units into the skin 2 (two) times daily. 10 pen 11  . Insulin Pen Needle 32G X 4 MM MISC Inject twice daily, SQ. DX E11.9 200 each 3  . Lancets (ONETOUCH ULTRASOFT) lancets Check sugar once daily DX E11.9 100 each 3  . levothyroxine (SYNTHROID) 200 MCG tablet Take 1 tablet (200 mcg total) by mouth daily before breakfast. 90 tablet 3  . losartan (COZAAR) 100 MG tablet Take 1 tablet (100 mg total) by mouth daily. 90 tablet 3  . meloxicam (MOBIC) 7.5 MG tablet TAKE 1  TABLET BY MOUTH 2 TIMES A DAY WITH A MEAL  1  . NON FORMULARY CPAP AT BEDTIME    . rosuvastatin (CRESTOR) 5 MG tablet Take 1 tablet (5 mg total) by mouth daily. 30 tablet 11  . Syringe/Needle, Disp, (SYRINGE 3CC/25GX1") 25G X 1" 3 ML MISC 1 mL by Does not apply route every 30 (thirty) days. 12 each 1   No current facility-administered medications for this visit.     Review of Systems Review of Systems  Constitutional: Negative.   Respiratory: Negative.   Cardiovascular: Negative.     Blood pressure 130/78, pulse 78, resp. rate 14, height _0  (1.6 m), weight 211 lb (95.7 kg).  Physical Exam Physical Exam  Constitutional: She is oriented to person, place, and time. She appears well-developed and well-nourished.  Eyes: Conjunctivae are normal. No scleral icterus.  Neck: Neck supple.  Cardiovascular: Normal rate, regular rhythm and normal heart sounds.  Pulmonary/Chest: Effort normal and breath sounds normal. Right breast exhibits no inverted nipple, no mass, no nipple discharge, no skin change and no tenderness. Left breast exhibits no inverted nipple, no mass, no nipple discharge, no skin change and no tenderness.    Lymphadenopathy:    She has no cervical adenopathy.    She has no axillary adenopathy.  Neurological: She is alert and oriented to person, place, and time.  Skin: Skin is warm and dry.  Psychiatric: She has a normal mood and affect.    Data Reviewed Bilateral screening mammograms dated Nov 28, 2017 were reviewed.  2 focal densities in the medial aspect of the right breast noted, well away from the prior density in the upper outer quadrant.  Biopsy clip in the latter site well visualized.  BI-RADS-0.  Assessment    No clinical evidence of recurrent DCIS.    Plan   The patient reported a troubling confrontation with the mammography technologist, who reported that the "wrong site" was biopsied last year and that the "physician doing the biopsy did not put in a  clip".  The patient has been asked to submit a written description of the events of the day, so that this can be reviewed with the mammography staff.  It is inappropriate at any time or place for the technologist provide, regarding image interpretation outside of quality of the films.  The patient was reluctant to return to [facility] for additional imaging based on her yearly badgering by the staff and most recently by the radiologist last year.  We reviewed the new films together, and while it is unlikely that there is a  new process, additional imaging is warranted.  The patient is willing to return to [facility] for additional imaging.   HPI, Physical Exam, Assessment and Plan have been scribed under the direction and in the presence of Hervey Ard, MD.  Gaspar Cola, CMA  I have completed the exam and reviewed the above documentation for accuracy and completeness.  I agree with the above.  Haematologist has been used and any errors in dictation or transcription are unintentional.  Hervey Ard, M.D., F.A.C.S.  Forest Gleason Betsey Sossamon 12/15/2017, 9:05 AM

## 2017-12-24 ENCOUNTER — Ambulatory Visit
Admission: RE | Admit: 2017-12-24 | Discharge: 2017-12-24 | Disposition: A | Discharge status: Home / Self Care | Payer: 59 | Source: Ambulatory Visit | Attending: General Surgery | Admitting: General Surgery

## 2017-12-24 DIAGNOSIS — R928 Other abnormal and inconclusive findings on diagnostic imaging of breast: Secondary | ICD-10-CM | POA: Insufficient documentation

## 2017-12-24 DIAGNOSIS — N6489 Other specified disorders of breast: Secondary | ICD-10-CM

## 2018-02-19 ENCOUNTER — Ambulatory Visit: Payer: Self-pay | Admitting: Family Medicine

## 2018-03-18 ENCOUNTER — Ambulatory Visit (INDEPENDENT_AMBULATORY_CARE_PROVIDER_SITE_OTHER): Payer: 59 | Admitting: Family Medicine

## 2018-03-18 VITALS — BP 140/82 | HR 80 | Temp 98.6°F | Resp 16 | Wt 219.0 lb

## 2018-03-18 DIAGNOSIS — Z8349 Family history of other endocrine, nutritional and metabolic diseases: Secondary | ICD-10-CM

## 2018-03-18 DIAGNOSIS — I10 Essential (primary) hypertension: Secondary | ICD-10-CM

## 2018-03-18 DIAGNOSIS — Z6838 Body mass index (BMI) 38.0-38.9, adult: Secondary | ICD-10-CM

## 2018-03-18 DIAGNOSIS — I255 Ischemic cardiomyopathy: Secondary | ICD-10-CM

## 2018-03-18 DIAGNOSIS — E7849 Other hyperlipidemia: Secondary | ICD-10-CM | POA: Diagnosis not present

## 2018-03-18 DIAGNOSIS — E118 Type 2 diabetes mellitus with unspecified complications: Secondary | ICD-10-CM

## 2018-03-18 DIAGNOSIS — M159 Polyosteoarthritis, unspecified: Secondary | ICD-10-CM

## 2018-03-18 DIAGNOSIS — E039 Hypothyroidism, unspecified: Secondary | ICD-10-CM

## 2018-03-18 DIAGNOSIS — I2510 Atherosclerotic heart disease of native coronary artery without angina pectoris: Secondary | ICD-10-CM

## 2018-03-18 DIAGNOSIS — J309 Allergic rhinitis, unspecified: Secondary | ICD-10-CM

## 2018-03-18 DIAGNOSIS — M150 Primary generalized (osteo)arthritis: Secondary | ICD-10-CM

## 2018-03-18 DIAGNOSIS — Z794 Long term (current) use of insulin: Secondary | ICD-10-CM

## 2018-03-18 MED ORDER — MELOXICAM 7.5 MG PO TABS: ORAL_TABLET | ORAL | 1 refills | Status: DC

## 2018-03-18 MED ORDER — CETIRIZINE HCL 10 MG PO TABS: 10.0000 mg | ORAL_TABLET | Freq: Every day | ORAL | 11 refills | Status: DC

## 2018-03-18 NOTE — Progress Notes (Signed)
Michele [name]  MRN: [id number] DOB: 12-06-1955  Subjective:  HPI   The patient is a 62 year old female who presents today for follow up of her chronic health.  She was last seen on 12/10/17.  She has not had her routine labs in about 18 months.    Diabetes-Her last A1C was on 11/06/17 and it was greater than 14.  The patient states she has been checking her glucose at home an states that her levels have been running about 130-150 fasting.    Patient has asked to have her Iron, IBC and Ferretin levels done  Due to the family history of Hemochromatosis.  Patient Active Problem List   Diagnosis Date Noted  . Fat necrosis (segmental) of breast 12/30/2015  . Allergic rhinitis 11/28/2014  . Acid reflux 11/28/2014  . Adult hypothyroidism 11/28/2014  . DCIS (ductal carcinoma in situ) 06/03/2014  . Diabetes mellitus ([place]) 03/14/2012  . Shortness of breath 12/13/2011  . CAD (coronary artery disease) 12/13/2011  . Obesity 12/13/2011  . Hyperlipidemia 12/13/2011  . Hypertension 12/13/2011    Past Medical History:  Diagnosis Date  . Abscess of breast, right 08/19/2012  . Breast cancer ([place]) 07/13/2010   1.5 cm,intermediate grade DCIS, nuclear grade 2, ER 90%, PR 90% treated with wide excision, reexcision to negative margins and MammoSite partial breast radiation.  . CHF (congestive heart failure) ([place])   . Coronary artery disease 2009  . DCIS (ductal carcinoma in situ) of breast, right 2012  . Diabetes mellitus ([place])    Type II  . GERD (gastroesophageal reflux disease)   . Hyperlipidemia   . Hypertension   . Ischemic cardiomyopathy   . Obesity, unspecified   . Sleep apnea   . Thyroid disease    hypothyroidism  . Umbilical hernia without mention of obstruction or gangrene     Social History   Socioeconomic History  . Marital status: Married    Spouse name: Ludwig Clarks  . Number of children: 0  . Years of education: Not on file  . Highest education level: Not on file    Occupational History  . Occupation: retired    Fish farm manager: [employer]  Social Needs  . Financial resource strain: Not on file  . Food insecurity:    Worry: Not on file    Inability: Not on file  . Transportation needs:    Medical: Not on file    Non-medical: Not on file  Tobacco Use  . Smoking status: Never Smoker  . Smokeless tobacco: Never Used  Substance and Sexual Activity  . Alcohol use: No  . Drug use: No  . Sexual activity: Not on file  Lifestyle  . Physical activity:    Days per week: Not on file    Minutes per session: Not on file  . Stress: Not on file  Relationships  . Social connections:    Talks on phone: Not on file    Gets together: Not on file    Attends religious service: Not on file    Active member of club or organization: Not on file    Attends meetings of clubs or organizations: Not on file    Relationship status: Not on file  . Intimate partner violence:    Fear of current or ex partner: Not on file    Emotionally abused: Not on file    Physically abused: Not on file    Forced sexual activity: Not on file  Other Topics Concern  .  Not on file  Social History Narrative  . Not on file    Outpatient Encounter Medications as of 03/18/2018  Medication Sig Note  . allopurinol (ZYLOPRIM) 300 MG tablet Take 1 tablet (300 mg total) by mouth daily.   Marland Kitchen amLODipine (NORVASC) 10 MG tablet Take 1 tablet (10 mg total) by mouth daily.   . Blood Glucose Monitoring Suppl (ONE TOUCH ULTRA SYSTEM KIT) w/Device KIT Check sugar once daily. DX I20.3   . BYSTOLIC 20 MG TABS TAKE 1 TABLET BY MOUTH  DAILY   . cyanocobalamin (,VITAMIN B-12,) 1000 MCG/ML injection INJECT 1ML IM ONCE WEEKLY.   . fluticasone (FLONASE) 50 MCG/ACT nasal spray Place into the nose. 11/28/2014: Medication taken as needed. GENERIC OK Received from: [facility]  . glimepiride (AMARYL) 4 MG tablet Take 1 tablet (4 mg total) by mouth 2 (two) times daily.   Marland Kitchen glucose blood (ONE TOUCH  ULTRA TEST) test strip Use as instructed   . Insulin Glargine (LANTUS SOLOSTAR) 100 UNIT/ML Solostar Pen Inject 45 Units into the skin 2 (two) times daily.   . Insulin Pen Needle 32G X 4 MM MISC Inject twice daily, SQ. DX E11.9   . Lancets (ONETOUCH ULTRASOFT) lancets Check sugar once daily DX E11.9   . levothyroxine (SYNTHROID) 200 MCG tablet Take 1 tablet (200 mcg total) by mouth daily before breakfast.   . losartan (COZAAR) 100 MG tablet Take 1 tablet (100 mg total) by mouth daily.   . meloxicam (MOBIC) 7.5 MG tablet TAKE 1 TABLET BY MOUTH 2 TIMES A DAY WITH A MEAL 06/01/2015: Received from: External Pharmacy  . NON FORMULARY CPAP AT BEDTIME   . rosuvastatin (CRESTOR) 5 MG tablet Take 1 tablet (5 mg total) by mouth daily.   . Syringe/Needle, Disp, (SYRINGE 3CC/25GX1") 25G X 1" 3 ML MISC 1 mL by Does not apply route every 30 (thirty) days.   Marland Kitchen aspirin EC 81 MG tablet Take 81 mg by mouth daily.    No facility-administered encounter medications on file as of 03/18/2018.     No Known Allergies  Review of Systems  Constitutional: Negative for fever and malaise/fatigue.  Eyes: Negative.   Respiratory: Negative for cough, shortness of breath and wheezing.   Cardiovascular: Positive for palpitations. Negative for chest pain (chronic), orthopnea, claudication and leg swelling.  Gastrointestinal: Negative.   Skin: Negative.   Endo/Heme/Allergies: Negative.   Psychiatric/Behavioral: Negative.     Objective:  BP 140/82 (BP Location: Right Arm, Patient Position: Sitting, Cuff Size: Normal)   Pulse 80   Temp 98.6 F (37 C) (Oral)   Resp 16   Wt 219 lb (99.3 kg)   BMI 38.79 kg/m   Physical Exam  Constitutional: She is oriented to person, place, and time and well-developed, well-nourished, and in no distress.  HENT:  Head: Normocephalic and atraumatic.  Right Ear: External ear normal.  Left Ear: External ear normal.  Nose: Nose normal.  Mouth/Throat: Oropharynx is clear and moist.   Eyes: Conjunctivae are normal. No scleral icterus.  Neck: No thyromegaly present.  Cardiovascular: Normal rate, regular rhythm and normal heart sounds.  Pulmonary/Chest: Effort normal and breath sounds normal.  Abdominal: Soft.  Musculoskeletal: She exhibits no edema.  Lymphadenopathy:    She has no cervical adenopathy.  Neurological: She is alert and oriented to person, place, and time. Gait normal. GCS score is 15.  Skin: Skin is warm and dry.  Psychiatric: Mood, memory, affect and judgment normal.    Assessment and  Plan :  1. Type 2 diabetes mellitus with complication, with long-term current use of insulin ([place]) Lifestyle stressed for pt. - Comprehensive metabolic panel - Hemoglobin A1c  2. Essential hypertension  - CBC with Differential/Platelet - Comprehensive metabolic panel  3. Other hyperlipidemia On sttin. - Lipid Panel With LDL/HDL Ratio  4. Adult hypothyroidism  - TSH  5. FH: hemochromatosis  - Fe+TIBC+Fer  6. Primary osteoarthritis involving multiple joints  - meloxicam (MOBIC) 7.5 MG tablet; TAKE 1 TABLET BY MOUTH 2 TIMES A DAY WITH A MEAL  Dispense: 60 tablet; Refill: 1  7. Allergic rhinitis, unspecified seasonality, unspecified trigger  - cetirizine (ZYRTEC) 10 MG tablet; Take 1 tablet (10 mg total) by mouth daily.  Dispense: 30 tablet; Refill: 11  8. Coronary artery disease involving native coronary artery of native heart without angina pectoris  - Ambulatory referral to Cardiology--last seen in 2013. 9.Ischemic cardiomyopathy Consider Entresto.  I have done the exam and reviewed the chart and it is accurate to the best of my knowledge. Development worker, community has been used and  any errors in dictation or transcription are unintentional. Miguel Aschoff M.D. [facility]

## 2018-03-19 LAB — COMPREHENSIVE METABOLIC PANEL
A/G RATIO: 1.3 (ref 1.2–2.2)
ALK PHOS: 81 IU/L (ref 39–117)
ALT: 21 IU/L (ref 0–32)
AST: 21 IU/L (ref 0–40)
Albumin: 4.4 g/dL (ref 3.6–4.8)
BILIRUBIN TOTAL: 0.5 mg/dL (ref 0.0–1.2)
BUN/Creatinine Ratio: 20 (ref 12–28)
BUN: 17 mg/dL (ref 8–27)
CHLORIDE: 99 mmol/L (ref 96–106)
CO2: 22 mmol/L (ref 20–29)
Calcium: 10 mg/dL (ref 8.7–10.3)
Creatinine, Ser: 0.86 mg/dL (ref 0.57–1.00)
GFR calc non Af Amer: 73 mL/min/{1.73_m2} (ref >59)
GFR, EST AFRICAN AMERICAN: 84 mL/min/{1.73_m2} (ref >59)
GLUCOSE: 245 mg/dL — AB (ref 65–99)
Globulin, Total: 3.3 g/dL (ref 1.5–4.5)
POTASSIUM: 4.5 mmol/L (ref 3.5–5.2)
Sodium: 139 mmol/L (ref 134–144)
TOTAL PROTEIN: 7.7 g/dL (ref 6.0–8.5)

## 2018-03-19 LAB — CBC WITH DIFFERENTIAL/PLATELET
BASOS ABS: 0.1 10*3/uL (ref 0.0–0.2)
BASOS: 1 %
EOS (ABSOLUTE): 0.2 10*3/uL (ref 0.0–0.4)
Eos: 1 %
Hematocrit: 45.8 % (ref 34.0–46.6)
Hemoglobin: 15 g/dL (ref 11.1–15.9)
Immature Grans (Abs): 0.1 10*3/uL (ref 0.0–0.1)
Immature Granulocytes: 1 %
LYMPHS ABS: 4.5 10*3/uL — AB (ref 0.7–3.1)
LYMPHS: 32 %
MCH: 29.2 pg (ref 26.6–33.0)
MCHC: 32.8 g/dL (ref 31.5–35.7)
MCV: 89 fL (ref 79–97)
MONOS ABS: 0.8 10*3/uL (ref 0.1–0.9)
Monocytes: 5 %
NEUTROS ABS: 8.5 10*3/uL — AB (ref 1.4–7.0)
Neutrophils: 60 %
PLATELETS: 360 10*3/uL (ref 150–450)
RBC: 5.14 x10E6/uL (ref 3.77–5.28)
RDW: 13.1 % (ref 12.3–15.4)
WBC: 14.2 10*3/uL — ABNORMAL HIGH (ref 3.4–10.8)

## 2018-03-19 LAB — LIPID PANEL WITH LDL/HDL RATIO
CHOLESTEROL TOTAL: 245 mg/dL — AB (ref 100–199)
HDL: 31 mg/dL — ABNORMAL LOW (ref >39)
LDL CALC: 158 mg/dL — AB (ref 0–99)
LDL/HDL RATIO: 5.1 ratio — AB (ref 0.0–3.2)
Triglycerides: 281 mg/dL — ABNORMAL HIGH (ref 0–149)
VLDL Cholesterol Cal: 56 mg/dL — ABNORMAL HIGH (ref 5–40)

## 2018-03-19 LAB — IRON,TIBC AND FERRITIN PANEL
FERRITIN: 226 ng/mL — AB (ref 15–150)
IRON: 111 ug/dL (ref 27–139)
Iron Saturation: 34 % (ref 15–55)
Total Iron Binding Capacity: 323 ug/dL (ref 250–450)
UIBC: 212 ug/dL (ref 118–369)

## 2018-03-19 LAB — HEMOGLOBIN A1C
Est. average glucose Bld gHb Est-mCnc: 220 mg/dL
HEMOGLOBIN A1C: 9.3 % — AB (ref 4.8–5.6)

## 2018-03-19 LAB — TSH: TSH: 14.72 u[IU]/mL — ABNORMAL HIGH (ref 0.450–4.500)

## 2018-03-26 ENCOUNTER — Telehealth: Payer: Self-pay

## 2018-03-26 NOTE — Telephone Encounter (Signed)
ok 

## 2018-03-26 NOTE — Telephone Encounter (Signed)
-----   Message from Jerrol Banana., MD sent at 03/25/2018  8:55 AM EDT ----- Diabetes some better but lipids too high and thyroid undertreated.  Is patient taking her statin?  Would increase Synthroid by 50 mcg daily.  Recheck 3 months

## 2018-03-26 NOTE — Telephone Encounter (Signed)
Pt returned call. Please advise. Thanks TNP °

## 2018-03-26 NOTE — Telephone Encounter (Signed)
Dr Rosanna Randy- Patient had not been taking either of her medications regularly.  She will start taking her thyroid and statin daily and will recheck in three months.

## 2018-03-26 NOTE — Telephone Encounter (Signed)
Left message to call back  

## 2018-04-15 ENCOUNTER — Other Ambulatory Visit: Payer: Self-pay | Admitting: Family Medicine

## 2018-04-15 DIAGNOSIS — I10 Essential (primary) hypertension: Secondary | ICD-10-CM

## 2018-04-29 ENCOUNTER — Other Ambulatory Visit: Payer: Self-pay

## 2018-04-29 DIAGNOSIS — Z853 Personal history of malignant neoplasm of breast: Secondary | ICD-10-CM

## 2018-05-13 NOTE — Progress Notes (Signed)
Cardiology Office Note  Date:  05/14/2018   ID:  Jalissa, Heinzelman 12/09/1955, MRN [id number]  PCP:  Jerrol Banana., MD   Chief Complaint  Patient presents with  . other    CAD Medications reviewed verbally.     HPI:  [name] is a  62 year old woman with past medical history of morbid obesity,  coronary artery disease with 50% proximal LAD disease , followed by a 30% lesion by catheterization July 2009,  diabetes, hemoglobin A1c, poorly controlled hyperlipidemia,  ejection fraction 30% by echocardiogram in June 2009,  moderately dilated left atrium and left ventricle,  obstructive sleep apnea and wears CPAP. Who presents today by referral from Dr. Rosanna Randy for consultation of her coronary artery disease, hyperlipidemia   Last seen 2013 In follow-up today she denies any significant anginal symptoms Active but no regular exercise program Continues to struggle with her diabetes, most recent hemoglobin A1c of 9  Weight continues to run high Increase 10 pounds over the past 6 months 211 pounds now 223 pounds Reports she will go camping, likes hamburgers and [place] fries at local vendor  Occasionally feels palpitations, not on a regular basis Palpitations more at nighttime  Continues to have some medication compliance issues, forgets to take some of her pills  Reports blood pressure typically well controlled at home in the 130 range  Previous echocardiogram showed normal LV systolic function, normal right ventricular systolic pressures  EKG personally reviewed by myself on todays visit Shows normal sinus rhythm rate 73 bpm PVCs, unableto  exclude old septal infarct   PMH:   has a past medical history of Abscess of breast, right (08/19/2012), Breast cancer ([place]) (07/13/2010), CHF (congestive heart failure) ([place]), Coronary artery disease (2009), DCIS (ductal carcinoma in situ) of breast, right (2012), Diabetes mellitus ([place]), GERD (gastroesophageal reflux  disease), Hyperlipidemia, Hypertension, Ischemic cardiomyopathy, Obesity, unspecified, Sleep apnea, Thyroid disease, and Umbilical hernia without mention of obstruction or gangrene.  PSH:    Past Surgical History:  Procedure Laterality Date  . BREAST BIOPSY Right 2007   right bx neg  . BREAST BIOPSY Right 08/10/2016   fat necrosis/ done in Dr. Dwyane Luo office  . BREAST EXCISIONAL BIOPSY Right 2011   DCIS mammosite lumpectomy  . BREAST LUMPECTOMY Right Jan 2012   Wide excision  . BREAST MASS EXCISION Right 2011   December  . CARDIAC CATHETERIZATION  01/08/2008  . CHOLECYSTECTOMY  2013  [facility]  . HERNIA REPAIR  2013   epigastric  . INCISION AND DRAINAGE BREAST ABSCESS Right 08/19/2012  . TONSILLECTOMY AND ADENOIDECTOMY     age 5 yrs    Current Outpatient Medications  Medication Sig Dispense Refill  . amLODipine (NORVASC) 10 MG tablet TAKE 1 TABLET BY MOUTH  DAILY 90 tablet 3  . Blood Glucose Monitoring Suppl (ONE TOUCH ULTRA SYSTEM KIT) w/Device KIT Check sugar once daily. DX E11.9 1 each 0  . BYSTOLIC 20 MG TABS TAKE 1 TABLET BY MOUTH  DAILY 90 tablet 3  . cetirizine (ZYRTEC) 10 MG tablet Take 1 tablet (10 mg total) by mouth daily. 30 tablet 11  . cyanocobalamin (,VITAMIN B-12,) 1000 MCG/ML injection INJECT 1ML IM ONCE WEEKLY. 10 mL 12  . fluticasone (FLONASE) 50 MCG/ACT nasal spray Place into the nose.    Marland Kitchen glimepiride (AMARYL) 4 MG tablet Take 1 tablet (4 mg total) by mouth 2 (two) times daily. 180 tablet 3  . glucose blood (ONE  TOUCH ULTRA TEST) test strip Use as instructed 100 each 3  . Insulin Glargine (LANTUS SOLOSTAR) 100 UNIT/ML Solostar Pen Inject 45 Units into the skin 2 (two) times daily. 10 pen 11  . Insulin Pen Needle 32G X 4 MM MISC Inject twice daily, SQ. DX E11.9 200 each 3  . Lancets (ONETOUCH ULTRASOFT) lancets Check sugar once daily DX E11.9 100 each 3  . levothyroxine (SYNTHROID) 200 MCG tablet Take 1 tablet (200 mcg total) by mouth  daily before breakfast. 90 tablet 3  . losartan (COZAAR) 100 MG tablet TAKE 1 TABLET BY MOUTH  DAILY 90 tablet 3  . NON FORMULARY CPAP AT BEDTIME    . rosuvastatin (CRESTOR) 5 MG tablet Take 1 tablet (5 mg total) by mouth daily. 30 tablet 11  . Syringe/Needle, Disp, (SYRINGE 3CC/25GX1") 25G X 1" 3 ML MISC 1 mL by Does not apply route every 30 (thirty) days. 12 each 1  . allopurinol (ZYLOPRIM) 300 MG tablet Take 1 tablet (300 mg total) by mouth daily. (Patient not taking: Reported on 05/14/2018) 90 tablet 3  . aspirin EC 81 MG tablet Take 81 mg by mouth daily.    . meloxicam (MOBIC) 7.5 MG tablet TAKE 1 TABLET BY MOUTH 2 TIMES A DAY WITH A MEAL (Patient not taking: Reported on 05/14/2018) 60 tablet 1   No current facility-administered medications for this visit.      Allergies:   Patient has no known allergies.   Social History:  The patient  reports that she has never smoked. She has never used smokeless tobacco. She reports that she does not drink alcohol or use drugs.   Family History:   family history includes Arthritis in her father; CVA in her mother; Congestive Heart Failure in her mother; Diabetes in her mother and sister; Heart attack in her father, maternal grandfather, and paternal grandmother; Heart failure in her mother; Hemochromatosis in her brother; Hypertension in her brother, brother, and sister; Lung cancer in her sister; Stroke in her maternal grandmother.    Review of Systems: Review of Systems  Constitutional: Negative.   Respiratory: Negative.   Cardiovascular: Negative.   Gastrointestinal: Negative.   Musculoskeletal: Negative.   Neurological: Negative.   Psychiatric/Behavioral: Negative.   All other systems reviewed and are negative.    PHYSICAL EXAM: VS:  BP (!) 142/80 (BP Location: Left Arm, Patient Position: Sitting, Cuff Size: Normal)   Pulse 73   Ht 5' 2.5" (1.588 m)   Wt 223 lb (101.2 kg)   BMI 40.14 kg/m  , BMI Body mass index is 40.14 kg/m. GEN:  Well nourished, well developed, in no acute distress,  obese HEENT: normal  Neck: no JVD, carotid bruits, or masses Cardiac: RRR; no murmurs, rubs, or gallops,no edema  Respiratory:  clear to auscultation bilaterally, normal work of breathing GI: soft, nontender, nondistended, + BS MS: no deformity or atrophy  Skin: warm and dry, no rash Neuro:  Strength and sensation are intact Psych: euthymic mood, full affect   Recent Labs: 03/18/2018: ALT 21; BUN 17; Creatinine, Ser 0.86; Hemoglobin 15.0; Platelets 360; Potassium 4.5; Sodium 139; TSH 14.720    Lipid Panel Lab Results  Component Value Date   CHOL 245 (H) 03/18/2018   HDL 31 (L) 03/18/2018   LDLCALC 158 (H) 03/18/2018   TRIG 281 (H) 03/18/2018      Wt Readings from Last 3 Encounters:  05/14/18 223 lb (101.2 kg)  03/18/18 219 lb (99.3 kg)  12/13/17 211 lb (95.7  kg)     ASSESSMENT AND PLAN:  Atherosclerosis of native coronary artery of native heart with stable angina pectoris ([place]) - Plan: EKG 12-Lead Long discussion concerning anginal symptoms, she denies any issues at this time Recommend she call us for any symptoms of shortness of breath or chest discomfort concerning for angina No testing ordered given she is asymptomatic  Type 2 diabetes mellitus with other circulatory complication, with long-term current use of insulin ([place])  Other hyperlipidemia Previously on Lipitor and lipid panel was relatively well-controlled but had myalgias Now Crestor 5, long discussion with her she will increase to 10 mg daily If she develops myalgias on higher dose may need to add Zetia Shortness of breath Chronic mild shortness of breath, likely secondary to deconditioning, obesity  PVC (premature ventricular contraction) - Plan: EKG 12-Lead Asymptomatic PVCs We will continue beta-blocker, recommended she take extra half pill of bystolic for symptomatic PVCs  Morbid obesity We have encouraged continued exercise, careful diet  management in an effort to lose weight.   Total encounter time more than 60 minutes  Greater than 50% was spent in counseling and coordination of care with the patient  Disposition:   F/U 12 months  Patient was seen in consultation for Dr. Rosanna Randy and will be referred back to his office for ongoing care of the issues detailed above   Orders Placed This Encounter  Procedures  . EKG 12-Lead     Signed, Esmond Plants, M.D., Ph.D. 05/14/2018  [place], [place]

## 2018-05-14 ENCOUNTER — Ambulatory Visit (INDEPENDENT_AMBULATORY_CARE_PROVIDER_SITE_OTHER): Payer: 59 | Admitting: Cardiovascular Disease

## 2018-05-14 ENCOUNTER — Encounter: Payer: Self-pay | Admitting: Cardiovascular Disease

## 2018-05-14 VITALS — BP 142/80 | HR 73 | Ht 62.5 in | Wt 223.0 lb

## 2018-05-14 DIAGNOSIS — R0602 Shortness of breath: Secondary | ICD-10-CM

## 2018-05-14 DIAGNOSIS — Z794 Long term (current) use of insulin: Secondary | ICD-10-CM

## 2018-05-14 DIAGNOSIS — I25118 Atherosclerotic heart disease of native coronary artery with other forms of angina pectoris: Secondary | ICD-10-CM | POA: Diagnosis not present

## 2018-05-14 DIAGNOSIS — I493 Ventricular premature depolarization: Secondary | ICD-10-CM | POA: Insufficient documentation

## 2018-05-14 DIAGNOSIS — E7849 Other hyperlipidemia: Secondary | ICD-10-CM

## 2018-05-14 DIAGNOSIS — E1159 Type 2 diabetes mellitus with other circulatory complications: Secondary | ICD-10-CM | POA: Diagnosis not present

## 2018-05-14 MED ORDER — ROSUVASTATIN CALCIUM 10 MG PO TABS: 10.0000 mg | ORAL_TABLET | Freq: Every day | ORAL | 3 refills | Status: DC

## 2018-05-14 NOTE — Patient Instructions (Signed)
Medication Instructions:   Please increase the crestor up to 10 mg daily Goal LDL <70, total chol <150 If still elevated in Jan 2020, We could increase crestor up to 20 daily or add zetia 10 mg daily with crestor 10 mg  If you need a refill on your cardiac medications before your next appointment, please call your pharmacy.    Lab work: No new labs needed   If you have labs (blood work) drawn today and your tests are completely normal, you will receive your results only by: Marland Kitchen MyChart Message (if you have MyChart) OR . A paper copy in the mail If you have any lab test that is abnormal or we need to change your treatment, we will call you to review the results.   Testing/Procedures: No new testing needed   Follow-Up: At [facility], you and your health needs are our priority.  As part of our continuing mission to provide you with exceptional heart care, we have created designated Provider Care Teams.  These Care Teams include your primary Cardiologist (physician) and Advanced Practice Providers (APPs -  Physician Assistants and Nurse Practitioners) who all work together to provide you with the care you need, when you need it.  . You will need a follow up appointment in 12 months .   Please call our office 2 months in advance to schedule this appointment.    . Providers on your designated Care Team:   . Murray Hodgkins, NP . Christell Faith, PA-C . Marrianne Mood, PA-C  Any Other Special Instructions Will Be Listed Below (If Applicable).  For educational health videos Log in to : [email or web address] Or : [email or web address], password : [email or web address]

## 2018-06-26 ENCOUNTER — Other Ambulatory Visit: Payer: 59

## 2018-07-04 ENCOUNTER — Ambulatory Visit: Payer: 59 | Admitting: General Surgery

## 2018-07-16 ENCOUNTER — Ambulatory Visit
Admission: RE | Admit: 2018-07-16 | Discharge: 2018-07-16 | Disposition: A | Discharge status: Home / Self Care | Payer: 59 | Source: Ambulatory Visit | Attending: Family Medicine | Admitting: Family Medicine

## 2018-07-16 ENCOUNTER — Ambulatory Visit: Payer: 59

## 2018-07-16 ENCOUNTER — Ambulatory Visit
Admission: RE | Admit: 2018-07-16 | Discharge: 2018-07-16 | Disposition: A | Discharge status: Home / Self Care | Payer: 59 | Attending: Family Medicine | Admitting: Family Medicine

## 2018-07-16 ENCOUNTER — Encounter: Payer: Self-pay | Admitting: Family Medicine

## 2018-07-16 ENCOUNTER — Ambulatory Visit (INDEPENDENT_AMBULATORY_CARE_PROVIDER_SITE_OTHER): Payer: 59 | Admitting: Family Medicine

## 2018-07-16 VITALS — BP 134/76 | HR 75 | Temp 98.8°F | Wt 223.2 lb

## 2018-07-16 DIAGNOSIS — E118 Type 2 diabetes mellitus with unspecified complications: Secondary | ICD-10-CM | POA: Diagnosis not present

## 2018-07-16 DIAGNOSIS — M79642 Pain in left hand: Secondary | ICD-10-CM | POA: Diagnosis present

## 2018-07-16 DIAGNOSIS — M79641 Pain in right hand: Secondary | ICD-10-CM | POA: Diagnosis present

## 2018-07-16 DIAGNOSIS — I25118 Atherosclerotic heart disease of native coronary artery with other forms of angina pectoris: Secondary | ICD-10-CM

## 2018-07-16 DIAGNOSIS — Z794 Long term (current) use of insulin: Secondary | ICD-10-CM

## 2018-07-16 DIAGNOSIS — M779 Enthesopathy, unspecified: Secondary | ICD-10-CM | POA: Insufficient documentation

## 2018-07-16 DIAGNOSIS — E7849 Other hyperlipidemia: Secondary | ICD-10-CM

## 2018-07-16 DIAGNOSIS — I10 Essential (primary) hypertension: Secondary | ICD-10-CM

## 2018-07-16 DIAGNOSIS — E039 Hypothyroidism, unspecified: Secondary | ICD-10-CM

## 2018-07-16 DIAGNOSIS — C50919 Malignant neoplasm of unspecified site of unspecified female breast: Secondary | ICD-10-CM

## 2018-07-16 NOTE — Patient Instructions (Signed)
Take Meloxicam BID for 2 weeks

## 2018-07-16 NOTE — Progress Notes (Signed)
Patient: Michele [name] Female    DOB: 03-Feb-1956   63 y.o.   MRN: [id number] Visit Date: 07/16/2018  Today's Provider: [name], MD   Chief Complaint  Patient presents with  . Diabetes   Subjective:    HPI  Diabetes Mellitus Type II, Follow-up:   Lab Results  Component Value Date   HGBA1C 9.3 (H) 03/18/2018   HGBA1C >14 11/06/2017   HGBA1C 11.0 07/11/2017    Last seen for diabetes 3 months ago.  Management since then includes none. She reports good compliance with treatment. She is not having side effects.  Current symptoms include none and have been stable. Home blood sugar records: arranges from 200-220  Episodes of hypoglycemia? no   Current Insulin Regimen: yes Most Recent Eye Exam:  Weight trend: stable Prior visit with dietician: no Current diet: well balanced Current exercise: none  Pertinent Labs:    Component Value Date/Time   CHOL 245 (H) 03/18/2018 1553   TRIG 281 (H) 03/18/2018 1553   HDL 31 (L) 03/18/2018 1553   LDLCALC 158 (H) 03/18/2018 1553   CREATININE 0.86 03/18/2018 1553    Wt Readings from Last 3 Encounters:  07/16/18 223 lb 3.2 oz (101.2 kg)  05/14/18 223 lb (101.2 kg)  03/18/18 219 lb (99.3 kg)   She has had a recent bilateral hand pain, it hurts mainly in the thumbs and radial side of the wrist.  No swelling noted.  No known trauma.  She and her husband are enjoying retirement but have not done a good job obtain care other illnesses.  She states that they do take her medications. ------------------------------------------------------------------------   No Known Allergies   Current Outpatient Medications:  .  amLODipine (NORVASC) 10 MG tablet, TAKE 1 TABLET BY MOUTH  DAILY, Disp: 90 tablet, Rfl: 3 .  aspirin EC 81 MG tablet, Take 81 mg by mouth daily., Disp: , Rfl:  .  Blood Glucose Monitoring Suppl (ONE TOUCH ULTRA SYSTEM KIT) w/Device KIT, Check sugar once daily. DX E11.9, Disp: 1 each, Rfl: 0 .   BYSTOLIC 20 MG TABS, TAKE 1 TABLET BY MOUTH  DAILY, Disp: 90 tablet, Rfl: 3 .  cetirizine (ZYRTEC) 10 MG tablet, Take 1 tablet (10 mg total) by mouth daily., Disp: 30 tablet, Rfl: 11 .  cyanocobalamin (,VITAMIN B-12,) 1000 MCG/ML injection, INJECT 1ML IM ONCE WEEKLY., Disp: 10 mL, Rfl: 12 .  fluticasone (FLONASE) 50 MCG/ACT nasal spray, Place into the nose., Disp: , Rfl:  .  glimepiride (AMARYL) 4 MG tablet, Take 1 tablet (4 mg total) by mouth 2 (two) times daily., Disp: 180 tablet, Rfl: 3 .  glucose blood (ONE TOUCH ULTRA TEST) test strip, Use as instructed, Disp: 100 each, Rfl: 3 .  Insulin Glargine (LANTUS SOLOSTAR) 100 UNIT/ML Solostar Pen, Inject 45 Units into the skin 2 (two) times daily., Disp: 10 pen, Rfl: 11 .  Insulin Pen Needle 32G X 4 MM MISC, Inject twice daily, SQ. DX E11.9, Disp: 200 each, Rfl: 3 .  Lancets (ONETOUCH ULTRASOFT) lancets, Check sugar once daily DX E11.9, Disp: 100 each, Rfl: 3 .  levothyroxine (SYNTHROID) 200 MCG tablet, Take 1 tablet (200 mcg total) by mouth daily before breakfast., Disp: 90 tablet, Rfl: 3 .  losartan (COZAAR) 100 MG tablet, TAKE 1 TABLET BY MOUTH  DAILY, Disp: 90 tablet, Rfl: 3 .  meloxicam (MOBIC) 7.5 MG tablet, TAKE 1 TABLET BY MOUTH 2 TIMES A DAY WITH A MEAL, Disp: 60  tablet, Rfl: 1 .  NON FORMULARY, CPAP AT BEDTIME, Disp: , Rfl:  .  rosuvastatin (CRESTOR) 10 MG tablet, Take 1 tablet (10 mg total) by mouth daily., Disp: 90 tablet, Rfl: 3 .  Syringe/Needle, Disp, (SYRINGE 3CC/25GX1") 25G X 1" 3 ML MISC, 1 mL by Does not apply route every 30 (thirty) days., Disp: 12 each, Rfl: 1 .  allopurinol (ZYLOPRIM) 300 MG tablet, Take 1 tablet (300 mg total) by mouth daily. (Patient not taking: Reported on 05/14/2018), Disp: 90 tablet, Rfl: 3  Review of Systems  Constitutional: Negative.   HENT: Negative.   Eyes: Negative.   Respiratory: Negative.   Cardiovascular: Positive for palpitations.  Gastrointestinal: Negative.   Endocrine: Negative.     Genitourinary: Negative.   Musculoskeletal: Positive for arthralgias.  Allergic/Immunologic: Negative.   Neurological: Negative.   Psychiatric/Behavioral: Negative.     Social History   Tobacco Use  . Smoking status: Never Smoker  . Smokeless tobacco: Never Used  Substance Use Topics  . Alcohol use: No      Objective:   BP 134/76 (BP Location: Left Arm, Patient Position: Sitting, Cuff Size: Large)   Pulse 75   Temp 98.8 F (37.1 C) (Oral)   Wt 223 lb 3.2 oz (101.2 kg)   SpO2 99%   BMI 40.17 kg/m  Vitals:   07/16/18 1517  BP: 134/76  Pulse: 75  Temp: 98.8 F (37.1 C)  TempSrc: Oral  SpO2: 99%  Weight: 223 lb 3.2 oz (101.2 kg)     Physical Exam Constitutional:      Appearance: Normal appearance. She is well-developed. She is obese.  HENT:     Head: Normocephalic and atraumatic.     Right Ear: External ear normal.     Left Ear: External ear normal.     Nose: Nose normal.     Mouth/Throat:     Pharynx: Oropharynx is clear.  Eyes:     General: No scleral icterus.    Conjunctiva/sclera: Conjunctivae normal.  Neck:     Thyroid: No thyromegaly.  Cardiovascular:     Rate and Rhythm: Normal rate and regular rhythm.     Heart sounds: Normal heart sounds.  Pulmonary:     Effort: Pulmonary effort is normal.     Breath sounds: Normal breath sounds.  Abdominal:     Palpations: Abdomen is soft.  Skin:    General: Skin is warm and dry.  Neurological:     Mental Status: She is alert and oriented to person, place, and time.  Psychiatric:        Behavior: Behavior normal.        Thought Content: Thought content normal.        Judgment: Judgment normal.         Assessment & Plan    I, Hurman Horn, CMA, am acting as a scribe for [name]., MD.   1. Type 2 diabetes mellitus with complication, with long-term current use of insulin ([place]) Consider endocrine referral.  Last A1c is 9.3.  Patient admits she has not been compliant with lifestyle during  the holidays. - POCT glycosylated hemoglobin (Hb A1C)--9.9  2. Tendonitis Try meloxicam for 2 weeks only. - DG Hand Complete Left; Future - DG Hand Complete Right; Future  3. Pain of right hand May need referral to Dr. Nicola Police surgery - DG Hand Complete Left; Future  4. Pain of left hand  - DG Hand Complete Left; Future  5. Atherosclerosis of native coronary artery  of native heart with stable angina pectoris ([place]) All risk factors treated.  Patient noncompliant with follow-up with cardiology.  6. Essential hypertension Controlled   7. Adult hypothyroidism   8. Malignant neoplasm of female breast, unspecified estrogen receptor status, unspecified laterality, unspecified site of breast ([place])   9. Other hyperlipidemia   10. Morbid obesity ([place]) This is a major issue for patient.       I have done the exam and reviewed the above chart and it is accurate to the best of my knowledge. Development worker, community has been used in this note in any air is in the dictation or transcription are unintentional.  [name], MD  [place]

## 2018-07-17 ENCOUNTER — Telehealth: Payer: Self-pay

## 2018-07-17 NOTE — Telephone Encounter (Signed)
Patient was advised.  

## 2018-07-17 NOTE — Telephone Encounter (Signed)
Patient is returning a call to [facility]

## 2018-07-17 NOTE — Telephone Encounter (Signed)
-----   Message from Jerrol Banana., MD sent at 07/17/2018  9:29 AM EST ----- Arthritic changes.

## 2018-07-17 NOTE — Telephone Encounter (Signed)
LVMTRC 

## 2018-07-18 ENCOUNTER — Other Ambulatory Visit: Payer: 59

## 2018-07-25 ENCOUNTER — Ambulatory Visit: Payer: 59 | Admitting: General Surgery

## 2018-07-30 LAB — POCT GLYCOSYLATED HEMOGLOBIN (HGB A1C): Hemoglobin A1C: 9.9 % — AB (ref 4.0–5.6)

## 2018-08-08 ENCOUNTER — Ambulatory Visit
Admission: RE | Admit: 2018-08-08 | Discharge: 2018-08-08 | Disposition: A | Discharge status: Home / Self Care | Payer: 59 | Source: Ambulatory Visit | Attending: General Surgery | Admitting: General Surgery

## 2018-08-08 DIAGNOSIS — Z853 Personal history of malignant neoplasm of breast: Secondary | ICD-10-CM

## 2018-08-15 ENCOUNTER — Ambulatory Visit: Payer: 59 | Admitting: General Surgery

## 2018-09-03 ENCOUNTER — Encounter: Payer: Self-pay | Admitting: General Surgery

## 2018-09-03 ENCOUNTER — Other Ambulatory Visit: Payer: Self-pay

## 2018-09-03 ENCOUNTER — Ambulatory Visit (INDEPENDENT_AMBULATORY_CARE_PROVIDER_SITE_OTHER): Payer: 59 | Admitting: General Surgery

## 2018-09-03 VITALS — BP 187/92 | HR 91 | Temp 97.9°F | Resp 16 | Ht 62.0 in | Wt 223.0 lb

## 2018-09-03 DIAGNOSIS — Z853 Personal history of malignant neoplasm of breast: Secondary | ICD-10-CM | POA: Diagnosis not present

## 2018-09-03 NOTE — Patient Instructions (Signed)
  The patient has been asked to return to the office in five months  with a bilateral diagnostic mammogram.The patient is aware to call back for any questions or concerns.

## 2018-09-03 NOTE — Progress Notes (Signed)
Patient ID: Michele [name], female   DOB: 09-Mar-1956, 63 y.o.   MRN: [id number]  Chief Complaint  Patient presents with  . Follow-up    HPI Michele [name] is a 63 y.o. female who presents for a breast evaluation. The most recent mammogram was done on 07/29/18. Patient does perform regular self breast checks and gets regular mammograms done.    HPI  Past Medical History:  Diagnosis Date  . Abscess of breast, right 08/19/2012  . Breast cancer ([place]) 07/13/2010   1.5 cm,intermediate grade DCIS, nuclear grade 2, ER 90%, PR 90% treated with wide excision, reexcision to negative margins and MammoSite partial breast radiation.  . CHF (congestive heart failure) ([place])   . Coronary artery disease 2009  . DCIS (ductal carcinoma in situ) of breast, right 2012  . Diabetes mellitus ([place])    Type II  . GERD (gastroesophageal reflux disease)   . Hyperlipidemia   . Hypertension   . Ischemic cardiomyopathy   . Obesity, unspecified   . Sleep apnea   . Thyroid disease    hypothyroidism  . Umbilical hernia without mention of obstruction or gangrene     Past Surgical History:  Procedure Laterality Date  . BREAST BIOPSY Right 2007   right bx neg  . BREAST BIOPSY Right 08/10/2016   fat necrosis/ done in Dr. Dwyane Luo office  . BREAST EXCISIONAL BIOPSY Right 2011   DCIS mammosite lumpectomy  . BREAST LUMPECTOMY Right Jan 2012   Wide excision  . BREAST MASS EXCISION Right 2011   December  . CARDIAC CATHETERIZATION  01/08/2008  . CHOLECYSTECTOMY  2013  . [facility]  [facility]  . HERNIA REPAIR  2013   epigastric  . INCISION AND DRAINAGE BREAST ABSCESS Right 08/19/2012  . TONSILLECTOMY AND ADENOIDECTOMY     age 85 yrs    Family History  Problem Relation Age of Onset  . Heart failure Mother   . Diabetes Mother   . Congestive Heart Failure Mother   . CVA Mother   . Heart attack Father   . Arthritis Father   . Diabetes Sister   . Hypertension Sister   . Lung cancer  Sister   . Hypertension Brother   . Hypertension Brother   . Hemochromatosis Brother   . Stroke Maternal Grandmother   . Heart attack Maternal Grandfather   . Heart attack Paternal Grandmother   . Breast cancer Neg Hx     Social History Social History   Tobacco Use  . Smoking status: Never Smoker  . Smokeless tobacco: Never Used  Substance Use Topics  . Alcohol use: No  . Drug use: No    No Known Allergies  Current Outpatient Medications  Medication Sig Dispense Refill  . allopurinol (ZYLOPRIM) 300 MG tablet Take 1 tablet (300 mg total) by mouth daily. 90 tablet 3  . amLODipine (NORVASC) 10 MG tablet TAKE 1 TABLET BY MOUTH  DAILY 90 tablet 3  . aspirin EC 81 MG tablet Take 81 mg by mouth daily.    . Blood Glucose Monitoring Suppl (ONE TOUCH ULTRA SYSTEM KIT) w/Device KIT Check sugar once daily. DX E11.9 1 each 0  . BYSTOLIC 20 MG TABS TAKE 1 TABLET BY MOUTH  DAILY 90 tablet 3  . cetirizine (ZYRTEC) 10 MG tablet Take 1 tablet (10 mg total) by mouth daily. 30 tablet 11  . cyanocobalamin (,VITAMIN B-12,) 1000 MCG/ML injection INJECT 1ML IM ONCE WEEKLY. 10 mL 12  .  fluticasone (FLONASE) 50 MCG/ACT nasal spray Place into the nose.    Marland Kitchen glimepiride (AMARYL) 4 MG tablet Take 1 tablet (4 mg total) by mouth 2 (two) times daily. 180 tablet 3  . glucose blood (ONE TOUCH ULTRA TEST) test strip Use as instructed 100 each 3  . Insulin Glargine (LANTUS SOLOSTAR) 100 UNIT/ML Solostar Pen Inject 45 Units into the skin 2 (two) times daily. 10 pen 11  . Insulin Pen Needle 32G X 4 MM MISC Inject twice daily, SQ. DX E11.9 200 each 3  . Lancets (ONETOUCH ULTRASOFT) lancets Check sugar once daily DX E11.9 100 each 3  . levothyroxine (SYNTHROID) 200 MCG tablet Take 1 tablet (200 mcg total) by mouth daily before breakfast. 90 tablet 3  . losartan (COZAAR) 100 MG tablet TAKE 1 TABLET BY MOUTH  DAILY 90 tablet 3  . meloxicam (MOBIC) 7.5 MG tablet TAKE 1 TABLET BY MOUTH 2 TIMES A DAY WITH A MEAL 60  tablet 1  . NON FORMULARY CPAP AT BEDTIME    . rosuvastatin (CRESTOR) 10 MG tablet Take 1 tablet (10 mg total) by mouth daily. 90 tablet 3  . Syringe/Needle, Disp, (SYRINGE 3CC/25GX1") 25G X 1" 3 ML MISC 1 mL by Does not apply route every 30 (thirty) days. 12 each 1   No current facility-administered medications for this visit.     Review of Systems Review of Systems  Constitutional: Negative.   Respiratory: Negative.   Cardiovascular: Negative.     Blood pressure (!) 187/92, pulse 91, temperature 97.9 F (36.6 C), temperature source Skin, resp. rate 16, height 5' 2" (1.575 m), weight 223 lb (101.2 kg), SpO2 98 %.  Physical Exam Physical Exam Exam conducted with a chaperone present.  Constitutional:      Appearance: She is well-developed.  Eyes:     General: No scleral icterus.    Conjunctiva/sclera: Conjunctivae normal.  Neck:     Musculoskeletal: Neck supple.  Cardiovascular:     Rate and Rhythm: Normal rate and regular rhythm.     Heart sounds: Normal heart sounds.  Pulmonary:     Effort: Pulmonary effort is normal.     Breath sounds: Normal breath sounds.  Chest:     Breasts:        Right: No inverted nipple, mass, nipple discharge, skin change or tenderness.        Left: No inverted nipple, mass, nipple discharge, skin change or tenderness.    Lymphadenopathy:     Cervical: No cervical adenopathy.     Upper Body:     Right upper body: No axillary adenopathy.     Left upper body: No supraclavicular or axillary adenopathy.  Skin:    General: Skin is warm and dry.  Neurological:     Mental Status: She is alert and oriented to person, place, and time.     Data Reviewed Right breast diagnostic mammogram and ultrasound dated August 08, 2018 was reviewed.  Changes consistent with fat necrosis noted.  BI-RADS-3.  Assessment Benign breast exam.  Plan  The patient has been asked to return to the office in five months  with a bilateral diagnostic mammogram.The  patient is aware to call back for any questions or concerns.   HPI, Physical Exam, Assessment and Plan have been scribed under the direction and in the presence of Hervey Ard, MD.  Gaspar Cola, CMA  I have completed the exam and reviewed the above documentation for accuracy and completeness.  I agree with the  above.  [employer] has been used and any errors in dictation or transcription are unintentional.  Jeffrey Byrnett, M.D., F.A.C.S.  Jeffrey W Byrnett 09/04/2018, 1:46 PM   

## 2018-11-14 ENCOUNTER — Ambulatory Visit: Payer: Self-pay | Admitting: Family Medicine

## 2018-11-26 ENCOUNTER — Other Ambulatory Visit: Payer: Self-pay | Admitting: Family Medicine

## 2018-11-26 DIAGNOSIS — E119 Type 2 diabetes mellitus without complications: Secondary | ICD-10-CM

## 2018-11-26 NOTE — Telephone Encounter (Signed)
Please review

## 2018-12-13 ENCOUNTER — Other Ambulatory Visit: Payer: Self-pay | Admitting: Family Medicine

## 2018-12-13 DIAGNOSIS — E039 Hypothyroidism, unspecified: Secondary | ICD-10-CM

## 2018-12-13 NOTE — Telephone Encounter (Signed)
Please review

## 2018-12-18 ENCOUNTER — Other Ambulatory Visit: Payer: Self-pay | Admitting: Family Medicine

## 2018-12-31 ENCOUNTER — Other Ambulatory Visit: Payer: Self-pay | Admitting: Family Medicine

## 2018-12-31 DIAGNOSIS — E119 Type 2 diabetes mellitus without complications: Secondary | ICD-10-CM

## 2019-01-06 ENCOUNTER — Other Ambulatory Visit: Payer: Self-pay | Admitting: *Deleted

## 2019-01-06 DIAGNOSIS — Z1231 Encounter for screening mammogram for malignant neoplasm of breast: Secondary | ICD-10-CM

## 2019-01-16 ENCOUNTER — Ambulatory Visit: Payer: Self-pay | Admitting: Family Medicine

## 2019-01-29 ENCOUNTER — Other Ambulatory Visit: Payer: 59

## 2019-02-06 ENCOUNTER — Ambulatory Visit: Payer: 59 | Admitting: General Surgery

## 2019-02-17 ENCOUNTER — Other Ambulatory Visit: Payer: Self-pay | Admitting: Physician Assistant

## 2019-02-17 DIAGNOSIS — E119 Type 2 diabetes mellitus without complications: Secondary | ICD-10-CM

## 2019-02-17 NOTE — Telephone Encounter (Signed)
Pharmacy requesting refills. Thanks!  

## 2019-03-13 ENCOUNTER — Ambulatory Visit: Payer: Self-pay | Admitting: Family Medicine

## 2019-04-08 ENCOUNTER — Other Ambulatory Visit: Payer: 59

## 2019-04-15 ENCOUNTER — Other Ambulatory Visit: Payer: Self-pay | Admitting: Family Medicine

## 2019-04-15 ENCOUNTER — Ambulatory Visit: Payer: 59 | Admitting: General Surgery

## 2019-04-15 DIAGNOSIS — E039 Hypothyroidism, unspecified: Secondary | ICD-10-CM

## 2019-04-15 MED ORDER — LEVOTHYROXINE SODIUM 200 MCG PO TABS: ORAL_TABLET | ORAL | 0 refills | Status: DC

## 2019-04-15 NOTE — Telephone Encounter (Signed)
[facility] faxed refill request for the following medications:  levothyroxine (SYNTHROID) 200 MCG tablet   Please advise.  

## 2019-04-16 ENCOUNTER — Other Ambulatory Visit: Payer: Self-pay | Admitting: Family Medicine

## 2019-04-16 DIAGNOSIS — I10 Essential (primary) hypertension: Secondary | ICD-10-CM

## 2019-04-17 ENCOUNTER — Ambulatory Visit: Payer: 59 | Admitting: General Surgery

## 2019-06-09 ENCOUNTER — Ambulatory Visit: Payer: Self-pay | Admitting: Family Medicine

## 2019-06-16 ENCOUNTER — Ambulatory Visit
Admission: RE | Admit: 2019-06-16 | Discharge: 2019-06-16 | Disposition: A | Discharge status: Home / Self Care | Payer: 59 | Source: Ambulatory Visit | Attending: General Surgery | Admitting: General Surgery

## 2019-06-16 DIAGNOSIS — Z1231 Encounter for screening mammogram for malignant neoplasm of breast: Secondary | ICD-10-CM

## 2019-06-16 HISTORY — DX: Personal history of irradiation: Z92.3

## 2019-06-24 ENCOUNTER — Ambulatory Visit: Payer: 59 | Admitting: General Surgery

## 2019-08-04 ENCOUNTER — Ambulatory Visit: Payer: Self-pay | Admitting: Family Medicine

## 2019-10-06 ENCOUNTER — Encounter: Payer: Self-pay | Admitting: Family Medicine

## 2019-10-06 ENCOUNTER — Ambulatory Visit: Payer: Self-pay | Admitting: Family Medicine

## 2019-10-06 ENCOUNTER — Other Ambulatory Visit: Payer: Self-pay

## 2019-10-06 ENCOUNTER — Ambulatory Visit (INDEPENDENT_AMBULATORY_CARE_PROVIDER_SITE_OTHER): Payer: 59 | Admitting: Family Medicine

## 2019-10-06 VITALS — BP 117/75 | HR 65 | Temp 96.8°F | Wt 198.4 lb

## 2019-10-06 DIAGNOSIS — L0291 Cutaneous abscess, unspecified: Secondary | ICD-10-CM | POA: Diagnosis not present

## 2019-10-06 DIAGNOSIS — I25118 Atherosclerotic heart disease of native coronary artery with other forms of angina pectoris: Secondary | ICD-10-CM

## 2019-10-06 DIAGNOSIS — E7849 Other hyperlipidemia: Secondary | ICD-10-CM

## 2019-10-06 DIAGNOSIS — L03811 Cellulitis of head [any part, except face]: Secondary | ICD-10-CM

## 2019-10-06 DIAGNOSIS — E1159 Type 2 diabetes mellitus with other circulatory complications: Secondary | ICD-10-CM

## 2019-10-06 DIAGNOSIS — D0511 Intraductal carcinoma in situ of right breast: Secondary | ICD-10-CM

## 2019-10-06 DIAGNOSIS — Z794 Long term (current) use of insulin: Secondary | ICD-10-CM

## 2019-10-06 MED ORDER — AMOXICILLIN-POT CLAVULANATE 875-125 MG PO TABS: 1.0000 | ORAL_TABLET | Freq: Two times a day (BID) | ORAL | 0 refills | Status: DC

## 2019-10-06 NOTE — Progress Notes (Signed)
Patient: Michele [name] Female    DOB: 12-Jan-1956   64 y.o.   MRN: [id number] Visit Date: 10/06/2019  Today's Provider: [name], MD   No chief complaint on file.  Subjective:    HPI   Patient presents today because she has a boil or cyst on her left temple and it has been there for 4 days.  She states it is getting a little better.  It was more firm before and is now a little softer and less tender.  This was after some drainage occurred.  Patient states she is always had a little sister that occasionally expressed some matter. Otherwise she has been doing well and goes out once a month to buy groceries.  She is a widow as her husband died about a year ago.  She has been coping fairly well with the Covid pandemic and the loss of her husband.  They have no children. No Known Allergies   Current Outpatient Medications:  .  amLODipine (NORVASC) 10 MG tablet, TAKE 1 TABLET BY MOUTH  DAILY, Disp: 90 tablet, Rfl: 3 .  Blood Glucose Monitoring Suppl (ONE TOUCH ULTRA SYSTEM KIT) w/Device KIT, Check sugar once daily. DX E11.9, Disp: 1 each, Rfl: 0 .  BYSTOLIC 20 MG TABS, TAKE 1 TABLET BY MOUTH  DAILY, Disp: 90 tablet, Rfl: 3 .  fluticasone (FLONASE) 50 MCG/ACT nasal spray, Place into the nose., Disp: , Rfl:  .  glimepiride (AMARYL) 4 MG tablet, TAKE 1 TABLET BY MOUTH  TWICE DAILY, Disp: 180 tablet, Rfl: 3 .  glucose blood (ONE TOUCH ULTRA TEST) test strip, Use as instructed, Disp: 100 each, Rfl: 3 .  Insulin Pen Needle 32G X 4 MM MISC, Inject twice daily, SQ. DX E11.9, Disp: 200 each, Rfl: 3 .  Lancets (ONETOUCH ULTRASOFT) lancets, Check sugar once daily DX E11.9, Disp: 100 each, Rfl: 3 .  LANTUS SOLOSTAR 100 UNIT/ML Solostar Pen, INJECT 45 UNITS UNDER THE SKIN TWICE DAILY, Disp: 30 mL, Rfl: 11 .  levothyroxine (SYNTHROID) 200 MCG tablet, TAKE 1 TABLET(200 MCG) BY MOUTH DAILY BEFORE BREAKFAST, Disp: 90 tablet, Rfl: 0 .  losartan (COZAAR) 100 MG tablet, TAKE 1 TABLET BY  MOUTH  DAILY, Disp: 90 tablet, Rfl: 0 .  rosuvastatin (CRESTOR) 10 MG tablet, Take 1 tablet (10 mg total) by mouth daily., Disp: 90 tablet, Rfl: 3 .  allopurinol (ZYLOPRIM) 300 MG tablet, Take 1 tablet (300 mg total) by mouth daily. (Patient not taking: Reported on 10/06/2019), Disp: 90 tablet, Rfl: 3 .  aspirin EC 81 MG tablet, Take 81 mg by mouth daily., Disp: , Rfl:  .  cetirizine (ZYRTEC) 10 MG tablet, Take 1 tablet (10 mg total) by mouth daily. (Patient not taking: Reported on 10/06/2019), Disp: 30 tablet, Rfl: 11 .  cyanocobalamin (,VITAMIN B-12,) 1000 MCG/ML injection, INJECT 1ML IM ONCE WEEKLY. (Patient not taking: Reported on 10/06/2019), Disp: 10 mL, Rfl: 12 .  meloxicam (MOBIC) 7.5 MG tablet, TAKE 1 TABLET BY MOUTH 2 TIMES A DAY WITH A MEAL (Patient not taking: Reported on 10/06/2019), Disp: 60 tablet, Rfl: 1 .  NON FORMULARY, CPAP AT BEDTIME, Disp: , Rfl:  .  Syringe/Needle, Disp, (SYRINGE 3CC/25GX1") 25G X 1" 3 ML MISC, 1 mL by Does not apply route every 30 (thirty) days. (Patient not taking: Reported on 10/06/2019), Disp: 12 each, Rfl: 1  Review of Systems  Constitutional: Negative.   HENT:       See HPI.  Eyes: Negative.   Respiratory: Negative.   Skin: Positive for rash and wound.  Allergic/Immunologic: Negative.   Hematological: Negative.   Psychiatric/Behavioral: Negative.     Social History   Tobacco Use  . Smoking status: Never Smoker  . Smokeless tobacco: Never Used  Substance Use Topics  . Alcohol use: No      Objective:   BP 117/75 (BP Location: Right Arm, Patient Position: Sitting, Cuff Size: Large)   Pulse 65   Temp (!) 96.8 F (36 C) (Temporal)   Wt 198 lb 6.4 oz (90 kg)   BMI 36.29 kg/m  Vitals:   10/06/19 1614  BP: 117/75  Pulse: 65  Temp: (!) 96.8 F (36 C)  TempSrc: Temporal  Weight: 198 lb 6.4 oz (90 kg)  Body mass index is 36.29 kg/m.   Physical Exam Vitals reviewed.  Constitutional:      Appearance: She is obese.  HENT:     Head:  Normocephalic and atraumatic.     Right Ear: External ear normal.     Left Ear: External ear normal.  Eyes:     General: No scleral icterus. Cardiovascular:     Rate and Rhythm: Normal rate and regular rhythm.     Heart sounds: Normal heart sounds.  Pulmonary:     Effort: Pulmonary effort is normal.     Breath sounds: Normal breath sounds.  Skin:    General: Skin is warm and dry.     Findings: Rash present.     Comments: There is on the left temple just behind the left eyebrow there is a 1 inch x 2 inch area that is raised and fluctuant.  It appears to be abscess which is not under pressure.  She states it is gone down a good bit.  Neurological:     General: No focal deficit present.     Mental Status: She is alert and oriented to person, place, and time.  Psychiatric:        Mood and Affect: Mood normal.        Behavior: Behavior normal.        Thought Content: Thought content normal.        Judgment: Judgment normal.      No results found for any visits on 10/06/19.     Assessment & Plan    1. Cellulitis of head except face Treat with Augmentin. I do not think there is an underlying cancer as the source of this infection. 2. Abscess After discussion it appears to be draining on its own.  Will not incise it today.  If it worsens he will come back.  3. Atherosclerosis of native coronary artery of native heart with stable angina pectoris ([place]) Risk factors treated  4. Type 2 diabetes mellitus with other circulatory complication, with long-term current use of insulin ([place]) Has a follow-up in May.  5. Other hyperlipidemia   6.H/O Ductal carcinoma in situ (DCIS) of right breast      [name], MD  [facility]

## 2019-11-13 NOTE — Progress Notes (Signed)
Established patient visit   I,Michele [name],acting as a scribe for Michele Durie, MD.,have documented all relevant documentation on the behalf of Michele Durie, MD,as directed by  Michele Durie, MD while in the presence of Michele Durie, MD.  Patient: Michele [name]   DOB: [birth year]   64 y.o. Female  MRN: [id number] Visit Date: 11/19/2019  Today's healthcare provider: [name], MD   Chief Complaint  Patient presents with  . Hypertension  . Hypothyroidism  . Diabetes  . Hyperlipidemia   Subjective    HPI  Patient is feeling well.  She has had both Covid vaccines.  Emotionally she states she is doing very well despite the pandemic and isolation and the death of her husband in the past year. She sometimes forgets to take some of her diabetes medication.  Otherwise she has no complaints.  Diabetes Mellitus Type II, follow-up  Lab Results  Component Value Date   HGBA1C 9.9 (A) 07/30/2018   HGBA1C 9.3 (H) 03/18/2018   HGBA1C >14 11/06/2017   Last seen for diabetes 4 months ago.  Management since then includes; Consider endocrine referral.  She reports good compliance with treatment. She is not having side effects.  Home blood sugar records: fasting range: 100's-150's  Episodes of hypoglycemia? No    Most Recent Eye Exam: 2018 Patient is due for a foot exam --------------------------------------------------------------------------------------------------- Hypertension, follow-up  BP Readings from Last 3 Encounters:  11/19/19 132/78  10/06/19 117/75  09/03/18 (!) 187/92   Wt Readings from Last 3 Encounters:  11/19/19 208 lb (94.3 kg)  10/06/19 198 lb 6.4 oz (90 kg)  09/03/18 223 lb (101.2 kg)     She was last seen for hypertension 4 months ago.  BP at that visit was 134/76. Management since that visit includes; Controlled. She reports good compliance with treatment. She is not having side effects.  She is not  exercising. She does not smoke.  --------------------------------------------------------------------------------------------------- Lipid/Cholesterol, follow-up  Last Lipid Panel: Lab Results  Component Value Date   CHOL 245 (H) 03/18/2018   LDLCALC 158 (H) 03/18/2018   HDL 31 (L) 03/18/2018   TRIG 281 (H) 03/18/2018    She was last seen for this 03/18/2018.  Management since that visit includes; continue rosuvastatin 10 mg qd. She reports good compliance with treatment. She is not having side effects.   Last metabolic panel Lab Results  Component Value Date   GLUCOSE 245 (H) 03/18/2018   NA 139 03/18/2018   K 4.5 03/18/2018   BUN 17 03/18/2018   CREATININE 0.86 03/18/2018   GFRNONAA 73 03/18/2018   GFRAA 84 03/18/2018   CALCIUM 10.0 03/18/2018   AST 21 03/18/2018   ALT 21 03/18/2018   The 10-year ASCVD risk score Mikey Bussing DC Jr., et al., 2013) is: 19.2%  ---------------------------------------------------------------------------------------------------  Tendonitis From 07/17/2019-Try meloxicam for 2 weeks only.  Pain of right and left hand From 07/17/2019-May need referral to Dr. Nicola Police surgery. X-ray obtained.   Atherosclerosis of native coronary artery of native heart with stable angina pectoris ([place]) From 07/17/2019-All risk factors treated. Patient noncompliant with follow-up with cardiology.  Morbid obesity ([place]) From 07/17/2019-This is a major issue for patient.    Medications: Outpatient Medications Prior to Visit  Medication Sig  . allopurinol (ZYLOPRIM) 300 MG tablet Take 1 tablet (300 mg total) by mouth daily.  Marland Kitchen amLODipine (NORVASC) 10 MG tablet TAKE 1 TABLET BY MOUTH  DAILY  . Blood Glucose Monitoring Suppl (  ONE TOUCH ULTRA SYSTEM KIT) w/Device KIT Check sugar once daily. DX F02.6  . BYSTOLIC 20 MG TABS TAKE 1 TABLET BY MOUTH  DAILY  . cetirizine (ZYRTEC) 10 MG tablet Take 1 tablet (10 mg total) by mouth daily.  . fluticasone (FLONASE) 50  MCG/ACT nasal spray Place into the nose.  Marland Kitchen glimepiride (AMARYL) 4 MG tablet TAKE 1 TABLET BY MOUTH  TWICE DAILY  . glucose blood (ONE TOUCH ULTRA TEST) test strip Use as instructed  . Insulin Pen Needle 32G X 4 MM MISC Inject twice daily, SQ. DX E11.9  . Lancets (ONETOUCH ULTRASOFT) lancets Check sugar once daily DX E11.9  . LANTUS SOLOSTAR 100 UNIT/ML Solostar Pen INJECT 45 UNITS UNDER THE SKIN TWICE DAILY  . levothyroxine (SYNTHROID) 200 MCG tablet TAKE 1 TABLET(200 MCG) BY MOUTH DAILY BEFORE BREAKFAST  . losartan (COZAAR) 100 MG tablet TAKE 1 TABLET BY MOUTH  DAILY  . NON FORMULARY CPAP AT BEDTIME  . rosuvastatin (CRESTOR) 10 MG tablet Take 1 tablet (10 mg total) by mouth daily.  . Syringe/Needle, Disp, (SYRINGE 3CC/25GX1") 25G X 1" 3 ML MISC 1 mL by Does not apply route every 30 (thirty) days.  . cyanocobalamin (,VITAMIN B-12,) 1000 MCG/ML injection INJECT 1ML IM ONCE WEEKLY. (Patient not taking: Reported on 10/06/2019)  . meloxicam (MOBIC) 7.5 MG tablet TAKE 1 TABLET BY MOUTH 2 TIMES A DAY WITH A MEAL (Patient not taking: Reported on 10/06/2019)  . [DISCONTINUED] amoxicillin-clavulanate (AUGMENTIN) 875-125 MG tablet Take 1 tablet by mouth 2 (two) times daily.  . [DISCONTINUED] aspirin EC 81 MG tablet Take 81 mg by mouth daily.   No facility-administered medications prior to visit.    Review of Systems  Constitutional: Negative for appetite change, chills, fatigue and fever.  HENT: Negative.   Eyes: Negative.   Respiratory: Negative for chest tightness and shortness of breath.   Cardiovascular: Positive for palpitations (chronic and unchanged). Negative for chest pain.  Gastrointestinal: Negative for abdominal pain, nausea and vomiting.  Musculoskeletal: Positive for arthralgias and back pain.  Allergic/Immunologic: Negative.   Neurological: Negative for dizziness and weakness.  Hematological: Negative.   Psychiatric/Behavioral: Negative.     Last hemoglobin A1c Lab Results   Component Value Date   HGBA1C 9.9 (A) 07/30/2018    Objective    BP 132/78 (BP Location: Left Arm, Patient Position: Sitting, Cuff Size: Normal)   Pulse 78   Temp (!) 97.1 F (36.2 C) (Skin)   Wt 208 lb (94.3 kg)   SpO2 99%   BMI 38.04 kg/m    Physical Exam Constitutional:      Appearance: Normal appearance. She is obese.  HENT:     Head: Normocephalic and atraumatic.  Cardiovascular:     Rate and Rhythm: Normal rate and regular rhythm.     Pulses: Normal pulses.     Heart sounds: Normal heart sounds.  Pulmonary:     Effort: Pulmonary effort is normal.     Breath sounds: Normal breath sounds.  Musculoskeletal:        General: No swelling.     Right lower leg: No edema.     Left lower leg: No edema.  Skin:    General: Skin is warm and dry.  Neurological:     Mental Status: She is alert and oriented to person, place, and time. Mental status is at baseline.  Psychiatric:        Mood and Affect: Mood normal.        Behavior: Behavior  normal.        Thought Content: Thought content normal.        Judgment: Judgment normal.      Depression screen [facility] 2/9 11/19/2019 11/06/2017 10/18/2016  Decreased Interest 0 0 0  Down, Depressed, Hopeless 0 0 0  PHQ - 2 Score 0 0 0  Altered sleeping 2 - 3  Tired, decreased energy 1 - 1  Change in appetite 0 - 0  Feeling bad or failure about yourself  0 - 0  Trouble concentrating 0 - 0  Moving slowly or fidgety/restless 0 - 0  Suicidal thoughts 0 - 0  PHQ-9 Score 3 - 4  Difficult doing work/chores Not difficult at all - -   Fall Risk  11/19/2019 09/03/2018 11/06/2017  Falls in the past year? 1 0 No  Number falls in past yr: 0 - -  Injury with Fall? 0 - -     Functional Status Survey: Is the patient deaf or have difficulty hearing?: No Does the patient have difficulty seeing, even when wearing glasses/contacts?: No Does the patient have difficulty concentrating, remembering, or making decisions?: No Does the patient have  difficulty walking or climbing stairs?: No(sometimes-bad knees) Does the patient have difficulty dressing or bathing?: No Does the patient have difficulty doing errands alone such as visiting a doctor's office or shopping?: No     Office Visit from 11/19/2019 in [facility]  AUDIT-C Score  0       No results found for any visits on 11/19/19.  Assessment & Plan     1. Essential hypertension Trolled on amlodipine and losartan - CBC with Differential/Platelet - Comprehensive metabolic panel - Lipid Panel With LDL/HDL Ratio - TSH  2. Adult hypothyroidism Check TSH on Synthroid 200 mcg daily  3. Type 2 diabetes mellitus with other circulatory complication, with long-term current use of insulin ([facility]) On glimepiride Lantus.  Continue for now, no hypoglycemia - Hemoglobin A1c - Lipid Panel With LDL/HDL Ratio  4. Other hyperlipidemia On rosuvastatin - Lipid Panel With LDL/HDL Ratio  5. Family history of hemochromatosis In both parents and her brother.  May need genetic testing depending on iron studies - Fe+TIBC+Fer  6. Need for tetanus booster Tetanus on next visit  7. Gout, unspecified cause, unspecified chronicity, unspecified site Check uric acid - allopurinol (ZYLOPRIM) 300 MG tablet; Take 1 tablet (300 mg total) by mouth daily.  Dispense: 90 tablet; Refill: 3  8. Type 2 diabetes mellitus without complication, without long-term current use of insulin (HCC)  - Insulin Pen Needle 32G X 4 MM MISC; Inject twice daily, SQ. DX E11.9  Dispense: 200 each; Refill: 3 - insulin glargine (LANTUS SOLOSTAR) 100 UNIT/ML Solostar Pen; INJECT 45 UNITS UNDER THE SKIN TWICE DAILY  Dispense: 30 mL; Refill: 11  9. Ductal carcinoma in situ (DCIS) of right breast   10. B12 deficiency Check B12 level CPE in 4 months 11.  Ischemic cardiac cardiomyopathy 12.  Obesity Diet and exercise with weight loss as a long-term goal. No follow-ups on file.      I, Michele Durie,  MD, have reviewed all documentation for this visit. The documentation on 11/19/19 for the exam, diagnosis, procedures, and orders are all accurate and complete.    Vanassa Penniman Cranford Mon, MD  [facility] [phone] (phone) [phone] (fax)  [place]

## 2019-11-15 IMAGING — US US BREAST*R* LIMITED INC AXILLA
1 series · 7 of 7 positions shown · non-contrast
Comparison: Previous exam(s).

CLINICAL DATA: Recall from screening for asymmetries in the right
breast. History of a right lumpectomy for breast carcinoma in
July 2010.

EXAM:
DIGITAL DIAGNOSTIC RIGHT MAMMOGRAM WITH CAD AND TOMO
ULTRASOUND RIGHT BREAST

[Series 1: us breast*right* limited inc axilla · 0.06mm/px · 7 of 7 slices shown]
[im 1/7]
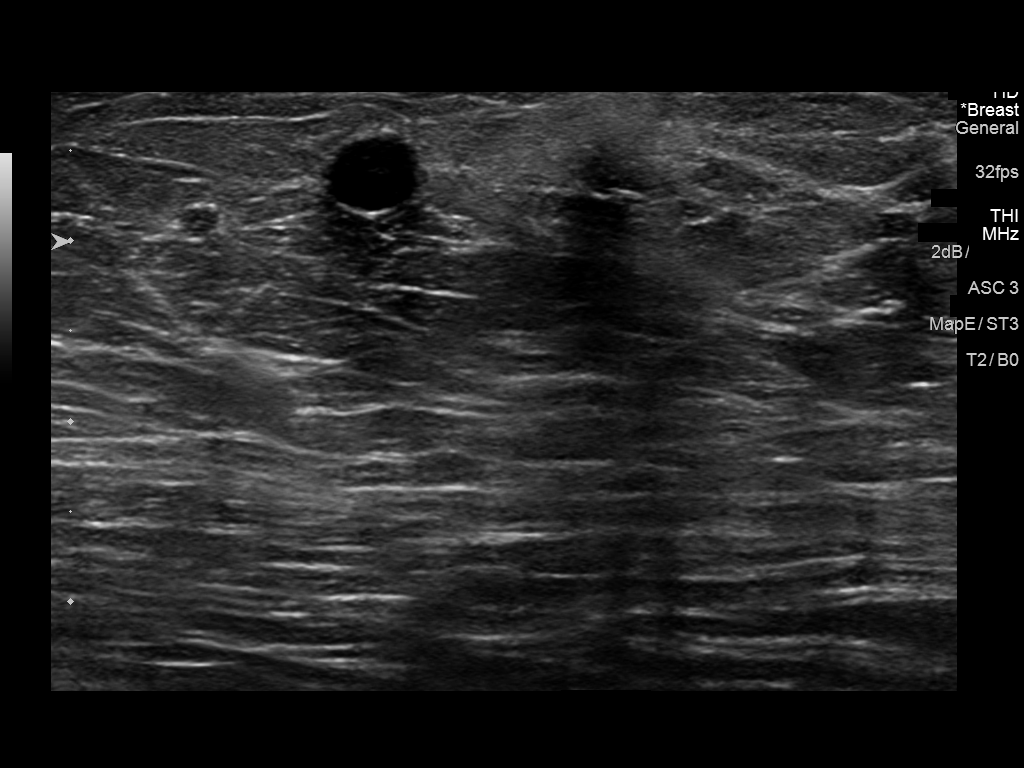
[im 2/7]
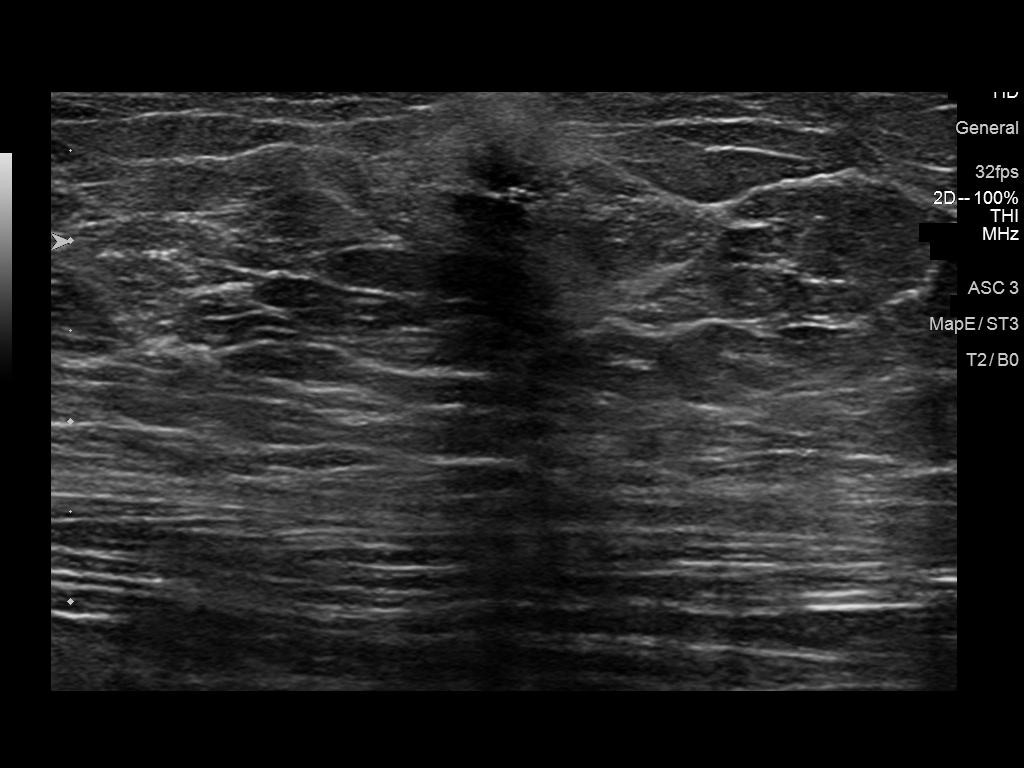
[im 3/7]
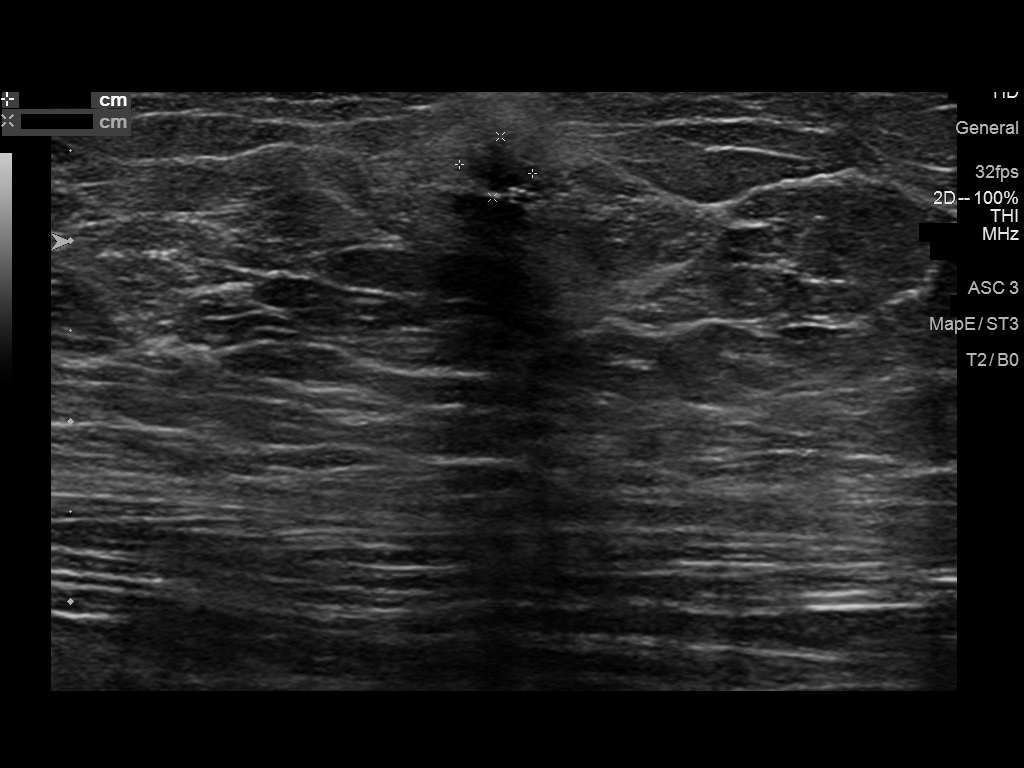
[im 4/7]
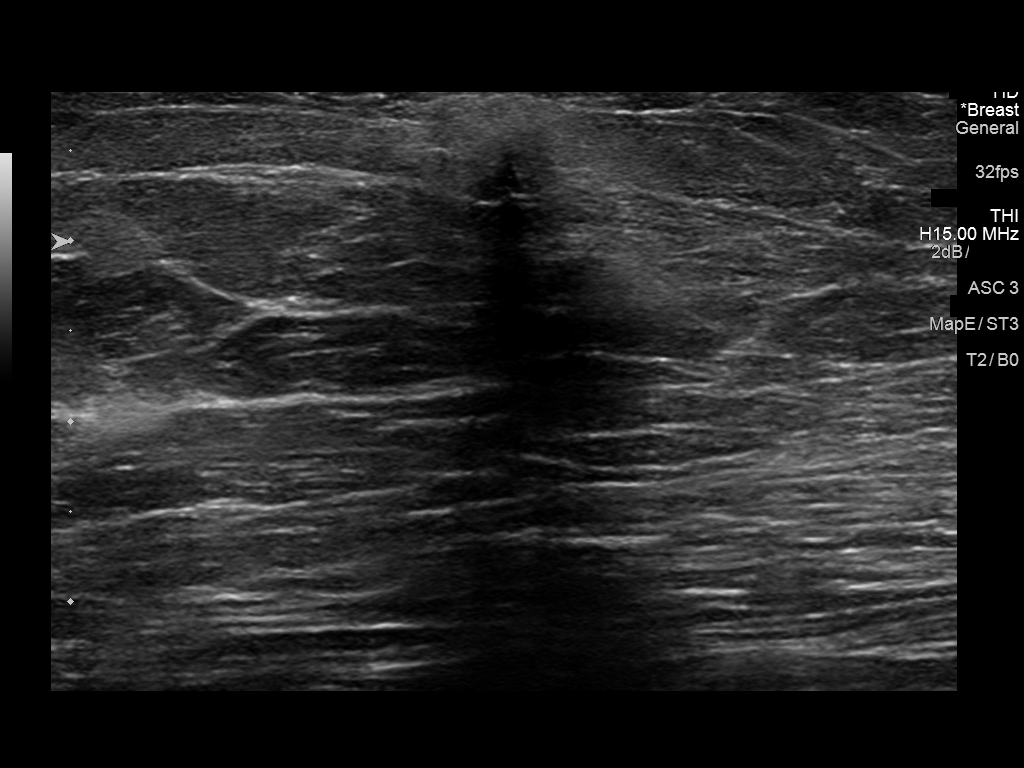
[im 5/7]
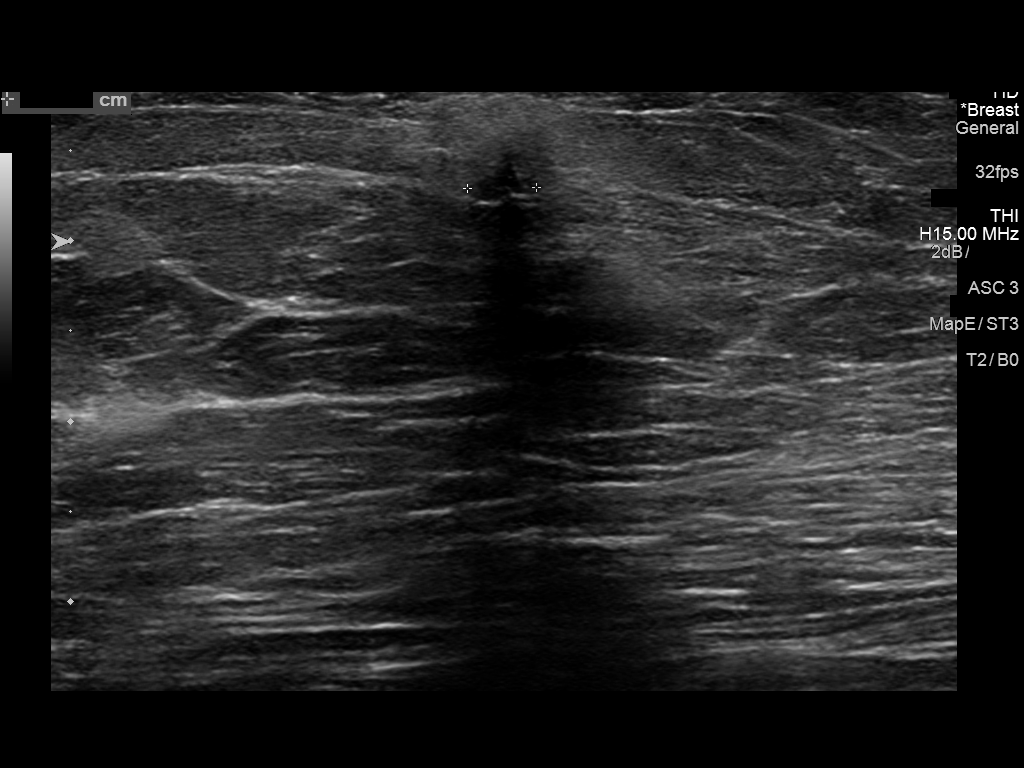
[im 6/7]
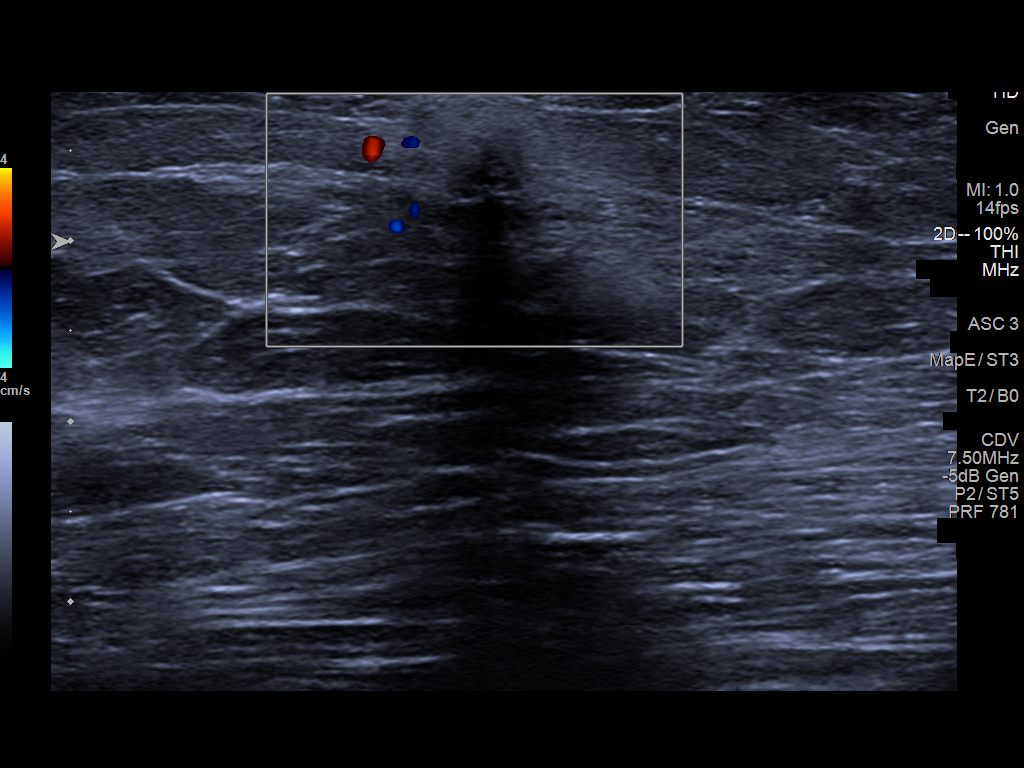
[im 7/7]
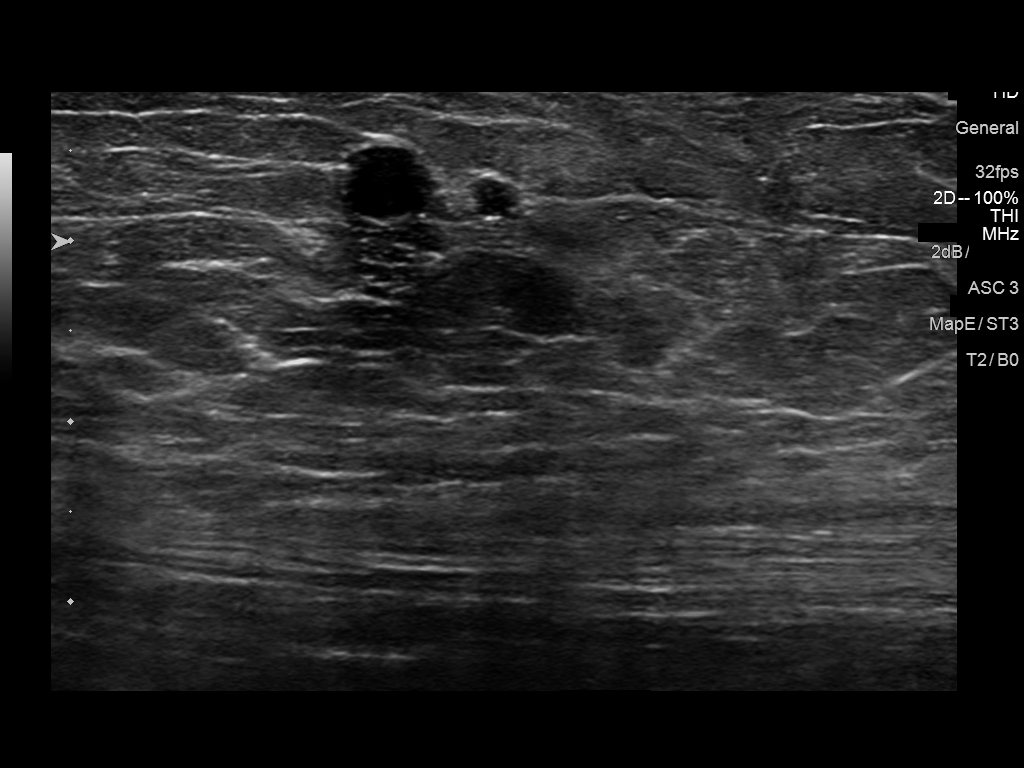

[7 of 7 positions shown; findings below may reference images not displayed]

ACR Breast Density Category b: There are scattered areas of
fibroglandular density.
FINDINGS: On the diagnostic spot-compression CC image, both asymmetries
disperse. However, on the MLO spot-compression diagnostic images, 1
of the 2 asymmetries persists. It contains a central coarse
calcification. It is new since prior studies. It lies adjacent to a
benign oil cyst in the upper, slightly medial aspect of the right
breast.

Mammographic images were processed with CAD.

On physical exam, no mass is palpated in the upper right breast.

Targeted ultrasound is performed, showing a vague hypoechoic lesion
with surrounding increased echogenicity in the right breast at 1
o'clock, 8 cm the nipple, superficial depth, measuring 4 x 3 x 4 mm.
This lies adjacent to 2 small cysts, the larger consistent with the
oil cyst seen mammographically.
IMPRESSION: 1. Probably benign area of fat necrosis in the upper right breast at
1 o'clock. Short-term follow-up is recommended.

RECOMMENDATION:
1. Diagnostic right breast mammography and ultrasound in 6 months.

I have discussed the findings and recommendations with the patient.
Results were also provided in writing at the conclusion of the
visit. If applicable, a reminder letter will be sent to the patient
regarding the next appointment.

BI-RADS CATEGORY  3: Probably benign.

## 2019-11-19 ENCOUNTER — Ambulatory Visit (INDEPENDENT_AMBULATORY_CARE_PROVIDER_SITE_OTHER): Payer: 59 | Admitting: Family Medicine

## 2019-11-19 ENCOUNTER — Other Ambulatory Visit: Payer: Self-pay

## 2019-11-19 VITALS — BP 132/78 | HR 78 | Temp 97.1°F | Wt 208.0 lb

## 2019-11-19 DIAGNOSIS — E7849 Other hyperlipidemia: Secondary | ICD-10-CM | POA: Diagnosis not present

## 2019-11-19 DIAGNOSIS — Z8349 Family history of other endocrine, nutritional and metabolic diseases: Secondary | ICD-10-CM

## 2019-11-19 DIAGNOSIS — I255 Ischemic cardiomyopathy: Secondary | ICD-10-CM

## 2019-11-19 DIAGNOSIS — Z23 Encounter for immunization: Secondary | ICD-10-CM

## 2019-11-19 DIAGNOSIS — E538 Deficiency of other specified B group vitamins: Secondary | ICD-10-CM

## 2019-11-19 DIAGNOSIS — E039 Hypothyroidism, unspecified: Secondary | ICD-10-CM

## 2019-11-19 DIAGNOSIS — E1159 Type 2 diabetes mellitus with other circulatory complications: Secondary | ICD-10-CM

## 2019-11-19 DIAGNOSIS — M109 Gout, unspecified: Secondary | ICD-10-CM

## 2019-11-19 DIAGNOSIS — I10 Essential (primary) hypertension: Secondary | ICD-10-CM

## 2019-11-19 DIAGNOSIS — D0511 Intraductal carcinoma in situ of right breast: Secondary | ICD-10-CM

## 2019-11-19 DIAGNOSIS — Z794 Long term (current) use of insulin: Secondary | ICD-10-CM

## 2019-11-19 DIAGNOSIS — E119 Type 2 diabetes mellitus without complications: Secondary | ICD-10-CM

## 2019-11-19 MED ORDER — ALLOPURINOL 300 MG PO TABS: 300.0000 mg | ORAL_TABLET | Freq: Every day | ORAL | 3 refills | Status: DC

## 2019-11-19 MED ORDER — LANTUS SOLOSTAR 100 UNIT/ML ~~LOC~~ SOPN: PEN_INJECTOR | SUBCUTANEOUS | 11 refills | Status: DC

## 2019-11-19 MED ORDER — INSULIN PEN NEEDLE 32G X 4 MM MISC: 3 refills | Status: DC

## 2019-11-20 LAB — COMPREHENSIVE METABOLIC PANEL
ALT: 17 IU/L (ref 0–32)
AST: 20 IU/L (ref 0–40)
Albumin/Globulin Ratio: 1.2 (ref 1.2–2.2)
Albumin: 4.2 g/dL (ref 3.8–4.8)
Alkaline Phosphatase: 72 IU/L (ref 39–117)
BUN/Creatinine Ratio: 22 (ref 12–28)
BUN: 19 mg/dL (ref 8–27)
Bilirubin Total: 0.4 mg/dL (ref 0.0–1.2)
CO2: 21 mmol/L (ref 20–29)
Calcium: 9.8 mg/dL (ref 8.7–10.3)
Chloride: 101 mmol/L (ref 96–106)
Creatinine, Ser: 0.88 mg/dL (ref 0.57–1.00)
GFR calc Af Amer: 80 mL/min/{1.73_m2} (ref >59)
GFR calc non Af Amer: 70 mL/min/{1.73_m2} (ref >59)
Globulin, Total: 3.6 g/dL (ref 1.5–4.5)
Glucose: 222 mg/dL — ABNORMAL HIGH (ref 65–99)
Potassium: 4.1 mmol/L (ref 3.5–5.2)
Sodium: 137 mmol/L (ref 134–144)
Total Protein: 7.8 g/dL (ref 6.0–8.5)

## 2019-11-20 LAB — CBC WITH DIFFERENTIAL/PLATELET
Basophils Absolute: 0.2 10*3/uL (ref 0.0–0.2)
Basos: 1 %
EOS (ABSOLUTE): 0.6 10*3/uL — ABNORMAL HIGH (ref 0.0–0.4)
Eos: 4 %
Hematocrit: 43.5 % (ref 34.0–46.6)
Hemoglobin: 14.1 g/dL (ref 11.1–15.9)
Immature Grans (Abs): 0.1 10*3/uL (ref 0.0–0.1)
Immature Granulocytes: 1 %
Lymphocytes Absolute: 4.4 10*3/uL — ABNORMAL HIGH (ref 0.7–3.1)
Lymphs: 28 %
MCH: 28.9 pg (ref 26.6–33.0)
MCHC: 32.4 g/dL (ref 31.5–35.7)
MCV: 89 fL (ref 79–97)
Monocytes Absolute: 1.2 10*3/uL — ABNORMAL HIGH (ref 0.1–0.9)
Monocytes: 8 %
Neutrophils Absolute: 9.4 10*3/uL — ABNORMAL HIGH (ref 1.4–7.0)
Neutrophils: 58 %
Platelets: 350 10*3/uL (ref 150–450)
RBC: 4.88 x10E6/uL (ref 3.77–5.28)
RDW: 13.4 % (ref 11.7–15.4)
WBC: 15.9 10*3/uL — ABNORMAL HIGH (ref 3.4–10.8)

## 2019-11-20 LAB — IRON,TIBC AND FERRITIN PANEL
Ferritin: 448 ng/mL — ABNORMAL HIGH (ref 15–150)
Iron Saturation: 27 % (ref 15–55)
Iron: 78 ug/dL (ref 27–139)
Total Iron Binding Capacity: 286 ug/dL (ref 250–450)
UIBC: 208 ug/dL (ref 118–369)

## 2019-11-20 LAB — HEMOGLOBIN A1C
Est. average glucose Bld gHb Est-mCnc: 235 mg/dL
Hgb A1c MFr Bld: 9.8 % — ABNORMAL HIGH (ref 4.8–5.6)

## 2019-11-20 LAB — LIPID PANEL WITH LDL/HDL RATIO
Cholesterol, Total: 130 mg/dL (ref 100–199)
HDL: 29 mg/dL — ABNORMAL LOW (ref >39)
LDL Chol Calc (NIH): 65 mg/dL (ref 0–99)
LDL/HDL Ratio: 2.2 ratio (ref 0.0–3.2)
Triglycerides: 215 mg/dL — ABNORMAL HIGH (ref 0–149)
VLDL Cholesterol Cal: 36 mg/dL (ref 5–40)

## 2019-11-20 LAB — TSH: TSH: 0.841 u[IU]/mL (ref 0.450–4.500)

## 2019-11-27 ENCOUNTER — Other Ambulatory Visit: Payer: Self-pay | Admitting: Cardiovascular Disease

## 2019-11-27 ENCOUNTER — Other Ambulatory Visit: Payer: Self-pay | Admitting: Family Medicine

## 2019-11-27 DIAGNOSIS — E7849 Other hyperlipidemia: Secondary | ICD-10-CM

## 2019-11-27 DIAGNOSIS — I10 Essential (primary) hypertension: Secondary | ICD-10-CM

## 2019-11-28 ENCOUNTER — Telehealth: Payer: Self-pay

## 2019-11-28 ENCOUNTER — Telehealth: Payer: Self-pay | Admitting: Cardiovascular Disease

## 2019-11-28 DIAGNOSIS — D72829 Elevated white blood cell count, unspecified: Secondary | ICD-10-CM

## 2019-11-28 NOTE — Telephone Encounter (Signed)
-----   Message from Anselm Pancoast, [place] sent at 11/27/2019  5:11 PM EDT ----- Please contact patient for a follow up appointment with Dr. Rockey Situ.  Thanks, Ivin Booty

## 2019-11-28 NOTE — Telephone Encounter (Signed)
Attempted to schedule.  LMOV to call office.  ° °

## 2019-11-28 NOTE — Telephone Encounter (Signed)
Called to advise patient on labs and labs also sent through mychart. Patient has read pcp's comment. Referral placed to hematology.

## 2019-11-28 NOTE — Telephone Encounter (Signed)
-----   Message from Jerrol Banana., MD sent at 11/27/2019  3:42 PM EDT ----- Diabetes poorly controlled.  Work on diet and exercise as she has in the past. Refer to hematology for likely hemochromatosis.  Also for leukocytosis.  She should have follow-up here in the next few months.

## 2019-12-16 ENCOUNTER — Inpatient Hospital Stay: Payer: 59 | Attending: Internal Medicine | Admitting: Internal Medicine

## 2019-12-16 ENCOUNTER — Telehealth: Payer: Self-pay | Admitting: Internal Medicine

## 2019-12-16 ENCOUNTER — Encounter: Payer: Self-pay | Admitting: Internal Medicine

## 2019-12-16 ENCOUNTER — Inpatient Hospital Stay: Payer: 59

## 2019-12-16 ENCOUNTER — Other Ambulatory Visit: Payer: Self-pay

## 2019-12-16 VITALS — BP 147/83 | HR 80 | Temp 97.9°F | Resp 18 | Wt 206.0 lb

## 2019-12-16 DIAGNOSIS — Z801 Family history of malignant neoplasm of trachea, bronchus and lung: Secondary | ICD-10-CM

## 2019-12-16 DIAGNOSIS — R7989 Other specified abnormal findings of blood chemistry: Secondary | ICD-10-CM

## 2019-12-16 DIAGNOSIS — Z8349 Family history of other endocrine, nutritional and metabolic diseases: Secondary | ICD-10-CM

## 2019-12-16 DIAGNOSIS — D72829 Elevated white blood cell count, unspecified: Secondary | ICD-10-CM

## 2019-12-16 DIAGNOSIS — M109 Gout, unspecified: Secondary | ICD-10-CM | POA: Insufficient documentation

## 2019-12-16 DIAGNOSIS — M2550 Pain in unspecified joint: Secondary | ICD-10-CM | POA: Diagnosis not present

## 2019-12-16 DIAGNOSIS — D729 Disorder of white blood cells, unspecified: Secondary | ICD-10-CM

## 2019-12-16 LAB — CBC WITH DIFFERENTIAL/PLATELET
Abs Immature Granulocytes: 0.1 10*3/uL — ABNORMAL HIGH (ref 0.00–0.07)
Basophils Absolute: 0.1 10*3/uL (ref 0.0–0.1)
Basophils Relative: 1 %
Eosinophils Absolute: 0.3 10*3/uL (ref 0.0–0.5)
Eosinophils Relative: 2 %
HCT: 43 % (ref 36.0–46.0)
Hemoglobin: 14.7 g/dL (ref 12.0–15.0)
Immature Granulocytes: 1 %
Lymphocytes Relative: 29 %
Lymphs Abs: 4.5 10*3/uL — ABNORMAL HIGH (ref 0.7–4.0)
MCH: 29.9 pg (ref 26.0–34.0)
MCHC: 34.2 g/dL (ref 30.0–36.0)
MCV: 87.6 fL (ref 80.0–100.0)
Monocytes Absolute: 0.9 10*3/uL (ref 0.1–1.0)
Monocytes Relative: 6 %
Neutro Abs: 9.6 10*3/uL — ABNORMAL HIGH (ref 1.7–7.7)
Neutrophils Relative %: 61 %
Platelets: 341 10*3/uL (ref 150–400)
RBC: 4.91 MIL/uL (ref 3.87–5.11)
RDW: 13.1 % (ref 11.5–15.5)
WBC: 15.5 10*3/uL — ABNORMAL HIGH (ref 4.0–10.5)
nRBC: 0 % (ref 0.0–0.2)

## 2019-12-16 LAB — COMPREHENSIVE METABOLIC PANEL
ALT: 22 U/L (ref 0–44)
AST: 19 U/L (ref 15–41)
Albumin: 3.9 g/dL (ref 3.5–5.0)
Alkaline Phosphatase: 69 U/L (ref 38–126)
Anion gap: 12 (ref 5–15)
BUN: 28 mg/dL — ABNORMAL HIGH (ref 8–23)
CO2: 23 mmol/L (ref 22–32)
Calcium: 9.8 mg/dL (ref 8.9–10.3)
Chloride: 99 mmol/L (ref 98–111)
Creatinine, Ser: 0.91 mg/dL (ref 0.44–1.00)
GFR calc Af Amer: >60 mL/min (ref >60)
GFR calc non Af Amer: >60 mL/min (ref >60)
Glucose, Bld: 442 mg/dL — ABNORMAL HIGH (ref 70–99)
Potassium: 4.3 mmol/L (ref 3.5–5.1)
Sodium: 134 mmol/L — ABNORMAL LOW (ref 135–145)
Total Bilirubin: 0.7 mg/dL (ref 0.3–1.2)
Total Protein: 8.4 g/dL — ABNORMAL HIGH (ref 6.5–8.1)

## 2019-12-16 LAB — TECHNOLOGIST SMEAR REVIEW: Plt Morphology: ADEQUATE

## 2019-12-16 LAB — LACTATE DEHYDROGENASE: LDH: 124 U/L (ref 98–192)

## 2019-12-16 LAB — C-REACTIVE PROTEIN: CRP: 2.1 mg/dL — ABNORMAL HIGH (ref <1.0)

## 2019-12-16 NOTE — Progress Notes (Signed)
New patient visit referred by Dr Rosanna Randy for Leukocytosis.  Pt has history of DCIS at age 64, states had radiation and tamoxifen treatment regime.

## 2019-12-16 NOTE — Telephone Encounter (Signed)
On 6/08-left a voicemail for the patient informing elevated blood sugars of 442.  Recommend frequent blood sugar checks follow-up with PCP regarding better management of blood sugars.  FYI-Dr.Gilbert.

## 2019-12-16 NOTE — Progress Notes (Signed)
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  female pleasant patient was been referred to [place] for further evaluation of elevated platelets/white count/Hemoglobin which was incidentally found on blood work.   Patient denies any history of blood clots or strokes. Denies any burning pain or discoloration in the fingertips or toes.   Appetite is good and weight loss or night sweats no recurrent fevers. No cough or shortness of breath or chest pain.   Patient is not on any steroids.  No recent infections.  No history of smoking.  No splenectomy.  Patient complains of intermittent joint pains hands shoulders knees.   Review of Systems  Constitutional: Positive for malaise/fatigue. Negative for chills, diaphoresis, fever and weight loss.  HENT: Negative for nosebleeds and sore throat.   Eyes: Negative for double vision.  Respiratory: Negative for cough, hemoptysis, sputum production, shortness of breath and wheezing.   Cardiovascular: Negative for chest pain, palpitations, orthopnea and leg swelling.  Gastrointestinal: Negative for abdominal pain, blood in stool, constipation, diarrhea, heartburn, melena, nausea  and vomiting.  Genitourinary: Negative for dysuria, frequency and urgency.  Musculoskeletal: Positive for back pain and joint pain.  Skin: Negative.  Negative for itching and rash.  Neurological: Negative for dizziness, tingling, focal weakness, weakness and headaches.  Endo/Heme/Allergies: Does not bruise/bleed easily.  Psychiatric/Behavioral: Negative for depression. The patient is not nervous/anxious and does not have insomnia.      MEDICAL HISTORY:  Past Medical History:  Diagnosis Date  . Abscess of breast, right 08/19/2012  . Breast cancer ([facility]) 07/13/2010   1.5 cm,intermediate grade DCIS, nuclear grade 2, ER 90%, PR 90% treated with wide excision, reexcision to negative margins and MammoSite partial breast radiation.  . CHF (congestive heart failure) ([place])   . Coronary artery disease 2009  . DCIS (ductal carcinoma in situ) of breast, right 2012  . Diabetes mellitus ([place])    Type II  . GERD (gastroesophageal reflux disease)   . Hyperlipidemia   . Hypertension   . Ischemic cardiomyopathy   . Obesity, unspecified   . Personal history of radiation therapy   . Sleep apnea   . Thyroid disease    hypothyroidism  . Umbilical hernia without mention of obstruction or gangrene     SURGICAL HISTORY: Past Surgical History:  Procedure Laterality Date  . BREAST BIOPSY Right 2007   right bx neg  . BREAST BIOPSY Right 08/10/2016   fat necrosis/ done in Dr. Dwyane Luo office  . BREAST EXCISIONAL BIOPSY Right 2011   DCIS mammosite lumpectomy  . BREAST LUMPECTOMY Right Jan 2012   Wide excision  . BREAST MASS EXCISION Right 2011   December  . CARDIAC CATHETERIZATION  01/08/2008  . CHOLECYSTECTOMY  2013  . [facility]  . HERNIA REPAIR  2013   epigastric  . INCISION AND DRAINAGE BREAST ABSCESS Right 08/19/2012  . TONSILLECTOMY AND ADENOIDECTOMY     age 34 yrs    SOCIAL HISTORY: Social History   Socioeconomic History  . Marital status: Married    Spouse  name: Ludwig Clarks  . Number of children: 0  . Years of education: Not on file  . Highest education level: Not on file  Occupational History  . Occupation: retired    Fish farm manager: [employer]  Tobacco Use  . Smoking status: Never Smoker  . Smokeless tobacco: Never Used  Substance and Sexual Activity  . Alcohol use: No  . Drug use: No  . Sexual activity: Not on file  Other Topics Concern  . Not on file  Social History Narrative   Never smoked; no alcohol; lives in [place]; self; husband passed in 2020. Retired from [employer] Strain:   . Difficulty of Paying Living Expenses:   Food Insecurity:   . Worried About Charity fundraiser in the Last Year:   . Arboriculturist in the Last Year:   Transportation Needs:   . Film/video editor (Medical):   Marland Kitchen Lack of Transportation (Non-Medical):   Physical Activity:   . Days of Exercise per Week:   . Minutes of Exercise per Session:   Stress:   . Feeling of Stress :   Social Connections:   . Frequency of Communication with Friends and Family:   . Frequency of Social Gatherings with Friends and Family:   . Attends Religious Services:   . Active Member of Clubs or Organizations:   . Attends Archivist Meetings:   Marland Kitchen Marital Status:   Intimate Partner Violence:   . Fear of Current or Ex-Partner:   . Emotionally Abused:   Marland Kitchen Physically Abused:   . Sexually Abused:     FAMILY HISTORY: Family History  Problem Relation Age of Onset  . Heart failure Mother   . Diabetes Mother   . Congestive Heart Failure Mother   . CVA Mother   . Heart attack Father   . Arthritis Father   . Diabetes Sister   . Hypertension Sister   . Lung cancer Sister   . Hypertension Brother   . Hypertension Brother   . Hemochromatosis Brother   . Stroke Maternal Grandmother   . Heart attack Maternal Grandfather   . Heart attack Paternal Grandmother   . Breast cancer Neg Hx     ALLERGIES:  has No  Known Allergies.  MEDICATIONS:  Current Outpatient Medications  Medication Sig Dispense Refill  . allopurinol (ZYLOPRIM) 300 MG tablet Take 1 tablet (300 mg total) by mouth daily. 90 tablet 3  . amLODipine (NORVASC) 10 MG tablet TAKE 1 TABLET BY MOUTH  DAILY 90 tablet 3  . Blood Glucose Monitoring Suppl (ONE TOUCH ULTRA SYSTEM KIT) w/Device KIT Check sugar once daily. DX E11.9 1 each 0  . BYSTOLIC 20 MG TABS TAKE 1 TABLET BY MOUTH  DAILY 90 tablet 3  . cetirizine (ZYRTEC) 10 MG tablet Take 1 tablet (10 mg total) by mouth daily. 30 tablet 11  . fluticasone (FLONASE) 50 MCG/ACT nasal spray Place into the nose.    Marland Kitchen glimepiride (AMARYL) 4 MG tablet TAKE 1 TABLET BY MOUTH  TWICE DAILY 180 tablet 3  . glucose blood (ONE TOUCH ULTRA TEST) test strip Use as instructed 100 each 3  . insulin glargine (LANTUS  SOLOSTAR) 100 UNIT/ML Solostar Pen INJECT 45 UNITS UNDER THE SKIN TWICE DAILY 30 mL 11  . Insulin Pen Needle 32G X 4 MM MISC Inject twice daily, SQ. DX E11.9 200 each 3  . Lancets (ONETOUCH ULTRASOFT) lancets Check sugar once daily DX E11.9 100 each 3  . levothyroxine (SYNTHROID) 200 MCG tablet TAKE 1 TABLET(200 MCG) BY MOUTH DAILY BEFORE BREAKFAST 90 tablet 0  . losartan (COZAAR) 100 MG tablet TAKE 1 TABLET BY MOUTH  DAILY 90 tablet 0  . NON FORMULARY CPAP AT BEDTIME    . rosuvastatin (CRESTOR) 10 MG tablet TAKE 1 TABLET BY MOUTH  DAILY 90 tablet 1  . Syringe/Needle, Disp, (SYRINGE 3CC/25GX1") 25G X 1" 3 ML MISC 1 mL by Does not apply route every 30 (thirty) days. 12 each 1   No current facility-administered medications for this visit.     PHYSICAL EXAMINATION:   Vitals:   12/16/19 1143  BP: (!) 147/83  Pulse: 80  Resp: 18  Temp: 97.9 F (36.6 C)  SpO2: 100%   Filed Weights   12/16/19 1143  Weight: 206 lb (93.4 kg)    Physical Exam  Constitutional: She is oriented to person, place, and time and well-developed, well-nourished, and in no distress.  HENT:  Head: Normocephalic  and atraumatic.  Mouth/Throat: Oropharynx is clear and moist. No oropharyngeal exudate.  Eyes: Pupils are equal, round, and reactive to light.  Cardiovascular: Normal rate and regular rhythm.  Pulmonary/Chest: Effort normal and breath sounds normal. No respiratory distress. She has no wheezes.  Abdominal: Soft. Bowel sounds are normal. She exhibits no distension and no mass. There is no abdominal tenderness. There is no rebound and no guarding.  Musculoskeletal:        General: No tenderness or edema. Normal range of motion.     Cervical back: Normal range of motion and neck supple.  Neurological: She is alert and oriented to person, place, and time.  Skin: Skin is warm.  Psychiatric: Affect normal.     LABORATORY DATA:  I have reviewed the data as listed Lab Results  Component Value Date   WBC 15.5 (H) 12/16/2019   HGB 14.7 12/16/2019   HCT 43.0 12/16/2019   MCV 87.6 12/16/2019   PLT 341 12/16/2019   Recent Labs    11/19/19 1143 12/16/19 1214  NA 137 134*  K 4.1 4.3  CL 101 99  CO2 21 23  GLUCOSE 222* 442*  BUN 19 28*  CREATININE 0.88 0.91  CALCIUM 9.8 9.8  GFRNONAA 70 >60  GFRAA 80 >60  PROT 7.8 8.4*  ALBUMIN 4.2 3.9  AST 20 19  ALT 17 22  ALKPHOS 72 69  BILITOT 0.4 0.7     No results found.  ASSESSMENT & PLAN:   Neutrophilia # Leucocytosis-about 15,000; [slightly rising from 2016]- j predominant neutrophilia mild lymphocytosis.  Asymptomatic; at a long discussion the patient regarding the potential causes of her abnormal blood counts-both primary bone marrow problem versus reactive/secondary causes.   # For now I  recommend checking CBC;CMP; LDH; jak 2 BCR ABL; MPL; CALR mutation on the peripheral blood;review of peripheral smear. Check peripheral smear. Also discussed regarding bone marrow biopsy with the above workup is inconclusive; however I would prefer not to do a bone marrow unless absolutely needed.  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  #Joint pains/suspect arthritis-however I do not suspect  hemochromatosis to cause of the patient's arthritis.  Await another work-up-patient may need rheumatology evaluation.  Thank you Dr.Gilbert for allowing me to participate in the care of your pleasant patient. Please do not hesitate to contact me with questions or concerns in the interim.  # DISPOSITION: # labs today [ordered] # Follow up MD- in 2 weeks; No labs- Dr.B     Cammie Sickle, MD 12/16/2019 1:05 PM

## 2019-12-16 NOTE — Assessment & Plan Note (Addendum)
#  Leucocytosis-about 15,000; [slightly rising from 2016]- j predominant neutrophilia mild lymphocytosis.  Asymptomatic; at a long discussion the patient regarding the potential causes of her abnormal blood counts-both primary bone marrow problem versus reactive/secondary causes.   # For now I  recommend checking CBC;CMP; LDH; jak 2 BCR ABL; MPL; CALR mutation on the peripheral blood;review of peripheral smear. Check peripheral smear. Also discussed regarding bone marrow biopsy with the above workup is inconclusive; however I would prefer not to do a bone marrow unless absolutely needed.  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  #Joint pains/suspect arthritis-however I do not suspect hemochromatosis to cause of the patient's arthritis.  Await another work-up-patient may need rheumatology evaluation.  Thank you Dr.Gilbert for allowing me to participate in the care of your pleasant patient. Please do not hesitate to contact me with questions or concerns in the interim.  # DISPOSITION: # labs today [ordered] # Follow up MD- in 2 weeks; No labs- Dr.B

## 2019-12-19 LAB — HEMOCHROMATOSIS DNA-PCR(C282Y,H63D)

## 2019-12-19 LAB — JAK2 GENOTYPR

## 2019-12-22 LAB — MPL MUTATION ANALYSIS

## 2019-12-22 LAB — CALRETICULIN (CALR) MUTATION ANALYSIS

## 2019-12-24 ENCOUNTER — Telehealth: Payer: Self-pay | Admitting: Family Medicine

## 2019-12-24 NOTE — Telephone Encounter (Signed)
Patient called with request for refill for Synthroid and does not want generic levothyroxine.

## 2019-12-24 NOTE — Telephone Encounter (Signed)
Pt called in to request a refill for her SYNTHROID 200 MCG tablet. Pt says that she doesn't want the generic.    Pharmacy:  [place], [place] Phone:  [phone]  Fax:  [phone]

## 2019-12-25 LAB — BCR-ABL1 FISH
Cells Analyzed: 200
Cells Counted: 200

## 2019-12-25 MED ORDER — SYNTHROID 200 MCG PO TABS: 200.0000 ug | ORAL_TABLET | Freq: Every day | ORAL | 1 refills | Status: DC

## 2019-12-30 ENCOUNTER — Telehealth: Payer: Self-pay

## 2019-12-30 DIAGNOSIS — E119 Type 2 diabetes mellitus without complications: Secondary | ICD-10-CM

## 2019-12-30 NOTE — Telephone Encounter (Signed)
Copied from [place] [id number]. Topic: General - Other >> Dec 30, 2019  3:42 PM Leward Quan A wrote: Reason for CRM: Patient called to request that her Rx for insulin glargine (LANTUS SOLOSTAR) 100 UNIT/ML Solostar Pen is written stating ( inject up to 50 unit under the skin ) Patient  states that this per pharmacy will help her get all the pens that she need without paying double and still have enough to last the whole month and the insurance will cover the Rx. Please send to [place], [place]  Phone:  [phone] Fax:  [phone]

## 2019-12-31 MED ORDER — LANTUS SOLOSTAR 100 UNIT/ML ~~LOC~~ SOPN: PEN_INJECTOR | SUBCUTANEOUS | 11 refills | Status: DC

## 2019-12-31 NOTE — Telephone Encounter (Signed)
Rx sent 

## 2019-12-31 NOTE — Telephone Encounter (Signed)
l mom to schedule past due f/u 

## 2020-01-01 ENCOUNTER — Encounter: Payer: Self-pay | Admitting: Internal Medicine

## 2020-01-01 ENCOUNTER — Other Ambulatory Visit: Payer: Self-pay

## 2020-01-01 ENCOUNTER — Inpatient Hospital Stay (HOSPITAL_BASED_OUTPATIENT_CLINIC_OR_DEPARTMENT_OTHER): Payer: 59 | Admitting: Internal Medicine

## 2020-01-01 DIAGNOSIS — C50919 Malignant neoplasm of unspecified site of unspecified female breast: Secondary | ICD-10-CM

## 2020-01-01 DIAGNOSIS — D72829 Elevated white blood cell count, unspecified: Secondary | ICD-10-CM | POA: Diagnosis not present

## 2020-01-01 DIAGNOSIS — D729 Disorder of white blood cells, unspecified: Secondary | ICD-10-CM | POA: Diagnosis not present

## 2020-01-01 NOTE — Assessment & Plan Note (Addendum)
#  Leucocytosis-about 15,000; [slightly rising from 2016]- j predominant neutrophilia mild lymphocytosis-suspect benign causes; inflammation-see below myeloproliferative work-up negative. Hold off bone marrow biopsy. Discussed with patient. She is in agreement. 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 Carrier not symptomatic. Hold phlebotomy for now  #Joint pains/Acute gout- allopurinol; question because of elevated CRP. Awaiting evaluation with Rheumatology; defer to PCP.   # DISPOSITION: # Follow up MD- in 4 months- MD; cbc/cmp;LDH; CRP; flowcytometry [ordered]-- Dr.B  Cc; Dr.Gilbert

## 2020-01-01 NOTE — Progress Notes (Signed)
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  female pleasant patient is here today with results of her blood work/work-up for leukocytosis.  Patient stated she had a recent diagnosis of gout of the bilateral toes. Patient started herself on allopurinol; since she has been helping his symptoms. She has prior history of gout.  She continues have chronic joint pains. Chronic myalgias not any worse.  Review of Systems  Constitutional: Positive for malaise/fatigue. Negative for chills, diaphoresis, fever and weight loss.  HENT: Negative for nosebleeds and sore throat.   Eyes: Negative for double vision.  Respiratory: Negative for cough, hemoptysis, sputum production, shortness of breath and wheezing.   Cardiovascular: Negative for chest pain, palpitations, orthopnea and leg swelling.  Gastrointestinal: Negative for abdominal pain, blood in stool, constipation, diarrhea, heartburn, melena, nausea and vomiting.  Genitourinary: Negative for dysuria, frequency and urgency.  Musculoskeletal: Positive for back pain and joint pain.  Skin: Negative.  Negative for itching and rash.  Neurological: Negative for  dizziness, tingling, focal weakness, weakness and headaches.  Endo/Heme/Allergies: Does not bruise/bleed easily.  Psychiatric/Behavioral: Negative for depression. The patient is not nervous/anxious and does not have insomnia.      MEDICAL HISTORY:  Past Medical History:  Diagnosis Date  . Abscess of breast, right 08/19/2012  . Breast cancer ([facility]) 07/13/2010   1.5 cm,intermediate grade DCIS, nuclear grade 2, ER 90%, PR 90% treated with wide excision, reexcision to negative margins and MammoSite partial breast radiation.  . CHF (congestive heart failure) ([place])   . Coronary artery disease 2009  . DCIS (ductal carcinoma in situ) of breast, right 2012  . Diabetes mellitus ([place] [facility])    Type II  . GERD (gastroesophageal reflux disease)   . Hyperlipidemia   . Hypertension   . Ischemic cardiomyopathy   . Obesity, unspecified   . Personal history of radiation therapy   . Sleep apnea   . Thyroid disease    hypothyroidism  . Umbilical hernia without mention of obstruction or gangrene     SURGICAL HISTORY: Past Surgical History:  Procedure Laterality Date  . BREAST BIOPSY Right 2007   right bx neg  . BREAST BIOPSY Right 08/10/2016   fat necrosis/ done in Dr. Dwyane Luo office  . BREAST EXCISIONAL BIOPSY Right 2011   DCIS mammosite lumpectomy  . BREAST LUMPECTOMY Right Jan 2012   Wide excision  . BREAST MASS EXCISION Right 2011   December  . CARDIAC CATHETERIZATION  01/08/2008  . CHOLECYSTECTOMY  2013  . [facility]  . HERNIA REPAIR  2013   epigastric  . INCISION AND DRAINAGE BREAST ABSCESS Right 08/19/2012  . TONSILLECTOMY AND ADENOIDECTOMY     age 3 yrs    SOCIAL HISTORY: Social History   Socioeconomic History  . Marital  status: Married    Spouse name: Ludwig Clarks  . Number of children: 0  . Years of education: Not on file  . Highest education level: Not on file  Occupational History  . Occupation: retired    Fish farm manager: [employer]  Tobacco Use  . Smoking  status: Never Smoker  . Smokeless tobacco: Never Used  Substance and Sexual Activity  . Alcohol use: No  . Drug use: No  . Sexual activity: Not on file  Other Topics Concern  . Not on file  Social History Narrative   Never smoked; no alcohol; lives in [place]; self; husband passed in 2020. Retired from [employer] Strain:   . Difficulty of Paying Living Expenses:   Food Insecurity:   . Worried About Charity fundraiser in the Last Year:   . Arboriculturist in the Last Year:   Transportation Needs:   . Film/video editor (Medical):   Marland Kitchen Lack of Transportation (Non-Medical):   Physical Activity:   . Days of Exercise per Week:   . Minutes of Exercise per Session:   Stress:   . Feeling of Stress :   Social Connections:   . Frequency of Communication with Friends and Family:   . Frequency of Social Gatherings with Friends and Family:   . Attends Religious Services:   . Active Member of Clubs or Organizations:   . Attends Archivist Meetings:   Marland Kitchen Marital Status:   Intimate Partner Violence:   . Fear of Current or Ex-Partner:   . Emotionally Abused:   Marland Kitchen Physically Abused:   . Sexually Abused:     FAMILY HISTORY: Family History  Problem Relation Age of Onset  . Heart failure Mother   . Diabetes Mother   . Congestive Heart Failure Mother   . CVA Mother   . Heart attack Father   . Arthritis Father   . Diabetes Sister   . Hypertension Sister   . Lung cancer Sister   . Hypertension Brother   . Hypertension Brother   . Hemochromatosis Brother   . Stroke Maternal Grandmother   . Heart attack Maternal Grandfather   . Heart attack Paternal Grandmother   . Breast cancer Neg Hx     ALLERGIES:  has No Known Allergies.  MEDICATIONS:  Current Outpatient Medications  Medication Sig Dispense Refill  . allopurinol (ZYLOPRIM) 300 MG tablet Take 1 tablet (300 mg total) by mouth daily. 90 tablet 3  . amLODipine  (NORVASC) 10 MG tablet TAKE 1 TABLET BY MOUTH  DAILY 90 tablet 3  . Blood Glucose Monitoring Suppl (ONE TOUCH ULTRA SYSTEM KIT) w/Device KIT Check sugar once daily. DX E11.9 1 each 0  . BYSTOLIC 20 MG TABS TAKE 1 TABLET BY MOUTH  DAILY 90 tablet 3  . cetirizine (ZYRTEC) 10 MG tablet Take 1 tablet (10 mg total) by mouth daily. 30 tablet 11  . fluticasone (FLONASE) 50 MCG/ACT nasal spray Place into the nose.    Marland Kitchen glimepiride (AMARYL) 4 MG tablet TAKE 1 TABLET BY MOUTH  TWICE DAILY 180 tablet 3  . glucose blood (ONE TOUCH ULTRA TEST) test strip Use as instructed 100 each 3  . insulin glargine (LANTUS SOLOSTAR) 100 UNIT/ML Solostar Pen INJECT 50 UNITS UNDER THE SKIN TWICE DAILY 30 mL 11  . Insulin Pen Needle 32G X 4 MM MISC Inject twice daily, SQ. DX E11.9 200 each 3  . Lancets [facility]  ULTRASOFT) lancets Check sugar once daily DX E11.9 100 each 3  . levothyroxine (SYNTHROID) 200 MCG tablet TAKE 1 TABLET(200 MCG) BY MOUTH DAILY BEFORE BREAKFAST 90 tablet 0  . losartan (COZAAR) 100 MG tablet TAKE 1 TABLET BY MOUTH  DAILY 90 tablet 0  . NON FORMULARY CPAP AT BEDTIME    . rosuvastatin (CRESTOR) 10 MG tablet TAKE 1 TABLET BY MOUTH  DAILY 90 tablet 1  . SYNTHROID 200 MCG tablet Take 1 tablet (200 mcg total) by mouth daily before breakfast. Brand Name only per patient's request 90 tablet 1  . Syringe/Needle, Disp, (SYRINGE 3CC/25GX1") 25G X 1" 3 ML MISC 1 mL by Does not apply route every 30 (thirty) days. 12 each 1   No current facility-administered medications for this visit.     PHYSICAL EXAMINATION:   Vitals:   01/01/20 1002  BP: (!) 158/93  Pulse: 83  Temp: 98.3 F (36.8 C)   Filed Weights   01/01/20 1002  Weight: 207 lb 8 oz (94.1 kg)    Physical Exam HENT:     Head: Normocephalic and atraumatic.     Mouth/Throat:     Pharynx: No oropharyngeal exudate.  Eyes:     Pupils: Pupils are equal, round, and reactive to light.  Cardiovascular:     Rate and Rhythm: Normal rate and  regular rhythm.  Pulmonary:     Effort: Pulmonary effort is normal. No respiratory distress.     Breath sounds: Normal breath sounds. No wheezing.  Abdominal:     General: Bowel sounds are normal. There is no distension.     Palpations: Abdomen is soft. There is no mass.     Tenderness: There is no abdominal tenderness. There is no guarding or rebound.  Musculoskeletal:        General: No tenderness. Normal range of motion.     Cervical back: Normal range of motion and neck supple.  Skin:    General: Skin is warm.  Neurological:     Mental Status: She is alert and oriented to person, place, and time.  Psychiatric:        Mood and Affect: Affect normal.      LABORATORY DATA:  I have reviewed the data as listed Lab Results  Component Value Date   WBC 15.5 (H) 12/16/2019   HGB 14.7 12/16/2019   HCT 43.0 12/16/2019   MCV 87.6 12/16/2019   PLT 341 12/16/2019   Recent Labs    11/19/19 1143 12/16/19 1214  NA 137 134*  K 4.1 4.3  CL 101 99  CO2 21 23  GLUCOSE 222* 442*  BUN 19 28*  CREATININE 0.88 0.91  CALCIUM 9.8 9.8  GFRNONAA 70 >60  GFRAA 80 >60  PROT 7.8 8.4*  ALBUMIN 4.2 3.9  AST 20 19  ALT 17 22  ALKPHOS 72 69  BILITOT 0.4 0.7     No results found.  ASSESSMENT & PLAN:   Neutrophilia # Leucocytosis-about 15,000; [slightly rising from 2016]- j predominant neutrophilia mild lymphocytosis-suspect benign causes; inflammation-see below myeloproliferative work-up negative. Hold off bone marrow biopsy. Discussed with patient. She is in agreement. 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 Carrier not symptomatic. Hold phlebotomy for now  #Joint pains/Acute gout- allopurinol; question because of elevated CRP. Awaiting evaluation with Rheumatology; defer to PCP.   # DISPOSITION: # Follow up MD- in 4 months- MD; cbc/cmp;LDH; CRP; flowcytometry [ordered]-- Dr.B  Cc;  Dr.Gilbert  Cammie Sickle, MD 01/01/2020 1:58 PM

## 2020-02-06 ENCOUNTER — Other Ambulatory Visit: Payer: Self-pay | Admitting: Family Medicine

## 2020-02-06 DIAGNOSIS — I10 Essential (primary) hypertension: Secondary | ICD-10-CM

## 2020-02-25 ENCOUNTER — Other Ambulatory Visit: Payer: Self-pay | Admitting: Family Medicine

## 2020-02-25 DIAGNOSIS — E119 Type 2 diabetes mellitus without complications: Secondary | ICD-10-CM

## 2020-03-08 ENCOUNTER — Other Ambulatory Visit: Payer: Self-pay | Admitting: Family Medicine

## 2020-03-08 NOTE — Telephone Encounter (Signed)
Requested Prescriptions  Pending Prescriptions Disp Refills   BYSTOLIC 20 MG TABS [Pharmacy Med Name: BYSTOLIC  20MG   TAB] 90 tablet 0    Sig: TAKE 1 TABLET BY MOUTH  DAILY     Cardiovascular:  Beta Blockers Failed - 03/08/2020  4:08 PM      Failed - Last BP in normal range    BP Readings from Last 1 Encounters:  01/01/20 (!) 158/93         Passed - Last Heart Rate in normal range    Pulse Readings from Last 1 Encounters:  01/01/20 83         Passed - Valid encounter within last 6 months    Recent Outpatient Visits          3 months ago Essential hypertension   [facility] Jerrol Banana., MD   5 months ago Cellulitis of head except face   [facility] Jerrol Banana., MD   1 year ago Type 2 diabetes mellitus with complication, with long-term current use of insulin [facility])   [facility] Jerrol Banana., MD   1 year ago Type 2 diabetes mellitus with complication, with long-term current use of insulin [facility])   [facility] Jerrol Banana., MD   2 years ago Type 2 diabetes mellitus with complication, with long-term current use of insulin [facility])   [facility] Jerrol Banana., MD      Future Appointments            In 2 weeks Jerrol Banana., MD [facility]

## 2020-03-10 NOTE — Telephone Encounter (Signed)
  Several attempted to schedule. Deleting recall.     Attempted to schedule no ans no vm

## 2020-03-24 ENCOUNTER — Encounter: Payer: Self-pay | Admitting: Family Medicine

## 2020-03-29 ENCOUNTER — Other Ambulatory Visit: Payer: Self-pay | Admitting: Family Medicine

## 2020-04-29 ENCOUNTER — Other Ambulatory Visit: Payer: 59

## 2020-04-29 ENCOUNTER — Ambulatory Visit: Payer: 59 | Admitting: Internal Medicine

## 2020-05-11 ENCOUNTER — Other Ambulatory Visit: Payer: Self-pay | Admitting: General Surgery

## 2020-05-11 DIAGNOSIS — D0510 Intraductal carcinoma in situ of unspecified breast: Secondary | ICD-10-CM

## 2020-05-19 ENCOUNTER — Other Ambulatory Visit: Payer: Self-pay | Admitting: Family Medicine

## 2020-05-19 NOTE — Telephone Encounter (Signed)
Requested Prescriptions  Pending Prescriptions Disp Refills  . BYSTOLIC 20 MG TABS [Pharmacy Med Name: BYSTOLIC  20MG   TAB] 90 tablet 0    Sig: TAKE 1 TABLET BY MOUTH  DAILY     Cardiovascular:  Beta Blockers Failed - 05/19/2020  6:07 AM      Failed - Last BP in normal range    BP Readings from Last 1 Encounters:  01/01/20 (!) 158/93         Failed - Valid encounter within last 6 months    Recent Outpatient Visits          6 months ago Essential hypertension   [facility] Jerrol Banana., MD   7 months ago Cellulitis of head except face   [facility] Jerrol Banana., MD   1 year ago Type 2 diabetes mellitus with complication, with long-term current use of insulin [facility])   [facility] Jerrol Banana., MD   2 years ago Type 2 diabetes mellitus with complication, with long-term current use of insulin [facility])   [facility] Jerrol Banana., MD   2 years ago Type 2 diabetes mellitus with complication, with long-term current use of insulin [facility])   [facility] Jerrol Banana., MD      Future Appointments            In 1 month Jerrol Banana., MD [facility]           Passed - Last Heart Rate in normal range    Pulse Readings from Last 1 Encounters:  01/01/20 83         Valid encounter 5 months ago on 11/19/2019.

## 2020-06-23 ENCOUNTER — Encounter: Payer: Self-pay | Admitting: Family Medicine

## 2020-07-15 ENCOUNTER — Ambulatory Visit: Payer: 59 | Admitting: Internal Medicine

## 2020-07-15 ENCOUNTER — Other Ambulatory Visit: Payer: 59

## 2020-07-20 ENCOUNTER — Other Ambulatory Visit: Payer: 59

## 2020-07-20 ENCOUNTER — Ambulatory Visit: Payer: 59 | Admitting: Internal Medicine

## 2020-07-29 ENCOUNTER — Encounter: Payer: Self-pay | Admitting: Family Medicine

## 2020-09-16 ENCOUNTER — Telehealth: Payer: Self-pay | Admitting: *Deleted

## 2020-09-16 NOTE — Telephone Encounter (Signed)
Michele [name] - if patient wants to come in May, that is fine. We will need to have labs drawn the week prior to the apt

## 2020-09-16 NOTE — Telephone Encounter (Signed)
pt called wanting to move her 09/21/20 lab/MD appts out all the way out to May... Pt is requesting a call back before scheduling her appts per Ellison Hughs

## 2020-09-21 ENCOUNTER — Ambulatory Visit: Payer: 59 | Admitting: Internal Medicine

## 2020-09-21 ENCOUNTER — Other Ambulatory Visit: Payer: 59

## 2020-10-05 ENCOUNTER — Other Ambulatory Visit: Payer: Self-pay

## 2020-10-05 ENCOUNTER — Ambulatory Visit
Admission: RE | Admit: 2020-10-05 | Discharge: 2020-10-05 | Disposition: A | Discharge status: Home / Self Care | Payer: Medicare Other | Source: Ambulatory Visit | Attending: General Surgery | Admitting: General Surgery

## 2020-10-05 DIAGNOSIS — Z1231 Encounter for screening mammogram for malignant neoplasm of breast: Secondary | ICD-10-CM | POA: Diagnosis present

## 2020-10-05 DIAGNOSIS — D0510 Intraductal carcinoma in situ of unspecified breast: Secondary | ICD-10-CM | POA: Insufficient documentation

## 2020-10-20 ENCOUNTER — Encounter: Payer: Self-pay | Admitting: Family Medicine

## 2020-11-11 ENCOUNTER — Other Ambulatory Visit: Payer: Self-pay

## 2020-11-18 ENCOUNTER — Ambulatory Visit: Payer: Self-pay | Admitting: Internal Medicine

## 2021-03-09 ENCOUNTER — Encounter: Payer: Self-pay | Admitting: Family Medicine

## 2021-04-26 ENCOUNTER — Encounter: Payer: Self-pay | Admitting: General Surgery

## 2021-06-16 ENCOUNTER — Ambulatory Visit: Payer: Self-pay | Admitting: *Deleted

## 2021-06-16 NOTE — Telephone Encounter (Signed)
Agree with holding amaryl and insulin for now, but keep close watch on blood sugars. Maybe see if anyone has appt available this week or next week to see her and review BGs

## 2021-06-16 NOTE — Telephone Encounter (Signed)
Called patient back to review hypoglycemic symptoms . Patient reports she ate a dinner roll and blood sugar now 81. Patient reports only symptom is feeling tired, but feels tiredness came be from her thyroid issues. Instructed patient to monitor symptoms and keep log of blood sugars and symptoms until seeing PCP. Patient verbalized she will follow care advise and call back or go to ED or call 911 if symptoms worsen. Earliest appt 07/07/21. Patient reports she will not take diabetic medications until notification from PCP. Please advise .

## 2021-06-16 NOTE — Telephone Encounter (Signed)
Patient advised as below. Fu 06/22/21

## 2021-06-16 NOTE — Telephone Encounter (Signed)
Reason for Disposition  [1] Low blood glucose (< 70 mg/dL  or 3.9 mmol/L) with no other adult present AND [2] hasn't tried Care Advice  Answer Assessment - Initial Assessment Questions 1. SYMPTOMS: "What symptoms are you concerned about?"     Low blood sugar and shortness of breath with exertion 2. ONSET:  "When did the symptoms start?"     Sunday 05/13/21 3. BLOOD GLUCOSE: "What is your blood glucose level?"      65  at 0600 4. USUAL RANGE: "What is your blood glucose level usually?" (e.g., usual fasting morning value, usual evening value)     Fasting this am 65 5. TYPE 1 or 2:  "Do you know what type of diabetes you have?"  (e.g., Type 1, Type 2, Gestational; doesn't know)      Type 2  6. INSULIN: "Do you take insulin?" "What type of insulin(s) do you use? What is the mode of delivery? (syringe, pen; injection or pump) "When did you last give yourself an insulin dose?" (i.e., time or hours/minutes ago) "How much did you give?" (i.e., how many units)     Yes lantus 50 units twice a day last given on Sunday or Monday  7. DIABETES PILLS: "Do you take any pills for your diabetes?"     Yes amaryl 4 mg twice at day . Last taken Sunday  8. OTHER SYMPTOMS: "Do you have any symptoms?" (e.g., fever, frequent urination, difficulty breathing, vomiting)     Shakiness , tired , tongue tingling, chills , denies fever 9. LOW BLOOD GLUCOSE TREATMENT: "What have you done so far to treat the low blood glucose level?"     Nothing this am but just ate some raisins less than a cup  10. FOOD: "When did you last eat or drink?"       Now ate some raisins  11. ALONE: "Are you alone right now or is someone with you?"        Yes  12. PREGNANCY: "Is there any chance you are pregnant?" "When was your last menstrual period?"       na  Protocols used: Diabetes - Low Blood Sugar-A-AH

## 2021-06-16 NOTE — Telephone Encounter (Signed)
C/o low blood sugar this am fasting blood sugar 65 at 0600. Patient has not eaten . C/o she was feeling shakiness, tired, tongue tingling, and chills , but no fever. Denies difficulty breathing but reports she has been feeling shortness of breath with exertion  and nasal congestion. Hx heart failure. Patient reports she has not eaten since checking blood sugar this am . Instructed patient to eat or drink something now. Patient reports she is eating less than 3/4 cup of raisins and will make her some bread in the oven. Reports she has been feeling tired and has not been to grocery store and limited food in house. Denies sweating , dizziness, N/V or frequent urination. Patient reports she has stopped taking her lantus insulin and amaryl since Sunday or Monday and still experiences low blood sugar. Instructed patient to contact family since she is alone regarding blood sugar and to check on her . Cook and eat her bread and she will receive a call back to recheck her blood sugar for further instructions.care advise given. Patient verbalized understanding of care advise and to call back or go to ED or Call 911 if symptoms worsen.

## 2021-06-22 ENCOUNTER — Ambulatory Visit (INDEPENDENT_AMBULATORY_CARE_PROVIDER_SITE_OTHER): Payer: Medicare Other | Admitting: Family Medicine

## 2021-06-22 ENCOUNTER — Other Ambulatory Visit: Payer: Self-pay

## 2021-06-22 ENCOUNTER — Encounter: Payer: Self-pay | Admitting: Family Medicine

## 2021-06-22 VITALS — BP 110/80 | HR 84 | Temp 97.8°F | Resp 18 | Ht 62.0 in | Wt 209.0 lb

## 2021-06-22 DIAGNOSIS — D0511 Intraductal carcinoma in situ of right breast: Secondary | ICD-10-CM

## 2021-06-22 DIAGNOSIS — Z23 Encounter for immunization: Secondary | ICD-10-CM

## 2021-06-22 DIAGNOSIS — E7849 Other hyperlipidemia: Secondary | ICD-10-CM

## 2021-06-22 DIAGNOSIS — E039 Hypothyroidism, unspecified: Secondary | ICD-10-CM | POA: Diagnosis not present

## 2021-06-22 DIAGNOSIS — I10 Essential (primary) hypertension: Secondary | ICD-10-CM | POA: Diagnosis not present

## 2021-06-22 DIAGNOSIS — G4733 Obstructive sleep apnea (adult) (pediatric): Secondary | ICD-10-CM

## 2021-06-22 DIAGNOSIS — D729 Disorder of white blood cells, unspecified: Secondary | ICD-10-CM

## 2021-06-22 DIAGNOSIS — Z794 Long term (current) use of insulin: Secondary | ICD-10-CM | POA: Diagnosis not present

## 2021-06-22 DIAGNOSIS — I509 Heart failure, unspecified: Secondary | ICD-10-CM | POA: Insufficient documentation

## 2021-06-22 DIAGNOSIS — I5022 Chronic systolic (congestive) heart failure: Secondary | ICD-10-CM

## 2021-06-22 DIAGNOSIS — E1159 Type 2 diabetes mellitus with other circulatory complications: Secondary | ICD-10-CM

## 2021-06-22 DIAGNOSIS — Z114 Encounter for screening for human immunodeficiency virus [HIV]: Secondary | ICD-10-CM

## 2021-06-22 LAB — POCT GLYCOSYLATED HEMOGLOBIN (HGB A1C)
Est. average glucose Bld gHb Est-mCnc: 240
Hemoglobin A1C: 10 % — AB (ref 4.0–5.6)

## 2021-06-22 MED ORDER — LEVOTHYROXINE SODIUM 200 MCG PO TABS: ORAL_TABLET | ORAL | 0 refills | Status: DC

## 2021-06-22 MED ORDER — GVOKE HYPOPEN 1-PACK 1 MG/0.2ML ~~LOC~~ SOAJ: 1.0000 mg | SUBCUTANEOUS | 3 refills | Status: DC | PRN

## 2021-06-22 NOTE — Patient Instructions (Signed)
It was great to see you!  Our plans for today:  - Take one pill of the glimepiride instead of two. No changes to your other medications.  - Let us know if you don't hear about an appointment with the Endocrinologist.  - Call Dr. Donivan Scull office to schedule an appointment.  - Come back in 3 months.  We are checking some labs today, we will release these results to your MyChart.  Take care and seek immediate care sooner if you develop any concerns.   Dr. Ky Barban

## 2021-06-22 NOTE — Progress Notes (Signed)
° °  SUBJECTIVE:   CHIEF COMPLAINT / HPI:   Hypertension, CAD, OSA: - Medications: amlodipine, bystolic, losartan - Compliance: misses 1-2 days per week. Usually takes bystolic daily - previously followed by Cardiology, last seen 2019, lost to follow up - Checking BP at home: no - Denies any SOB, CP, vision changes, LE edema, medication SEs, or symptoms of hypotension - Diet: see below - Exercise: see below - CPAP? no  Diabetes, Type 2 - Last A1c 9.8 11/2019 - Medications: glimepiride BID, lantus 50u BID - previously on metformin (GI intolerance), Jardiance (urinated too often), failed bydureon and victoza - Compliance: only taking both once daily.  - did have symptomatic episode of hypoglycemia recently to 35.  - Checking BG at home: not regularly. This morning fasting 285.  - Diet: 1-2 meals per day, eats primarily takeout. Has eating later in the day recently, around 2pm.   - Exercise: none d/t knee pain - Eye exam: due - Foot exam: due - Microalbumin: due - Statin: yes - PNA vaccine: due - Denies symptoms of foot ulcers/trauma - endorses polyuria, polydipsia.   HLD - medications: crestor - compliance: often misses 1-2 days per week - medication SEs: none reported  Hypothyroidism - Medications: Synthroid 239mcg - often misses half of the doses.  - Current symptoms:  none - Symptoms have been basically asymptomatic  Leukocytosis - with neutrophilia. Previously evaluated by Heme/Onc with negative myeloproliferative workup. Deferred bone marrow biopsy at that time. Planned for blood flow cytometry but lost to follow up.   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 Last visit with surgery 10/2020.  OBJECTIVE:   BP 110/80 (BP Location: Left Arm, Patient Position: Sitting, Cuff Size: Large)    Pulse 84    Temp 97.8 F (36.6 C) (Temporal)    Resp 18    Ht $R'5\' 2"'EJ$  (1.575 m)    Wt 209 lb (94.8 kg)    SpO2 98%    BMI 38.23 kg/m   Gen: well appearing, obese, in NAD Card:  RRR Lungs: CTAB Ext: WWP   ASSESSMENT/PLAN:   Diabetes mellitus Uncontrolled and worsened, A1c 10.0 today. Longstanding h/o medication and treatment plan nonadherence which complicates care. Has failed multiple agents and with recent hypoglycemic episode on current regimen. Will refer to endocrinology for evaluation. Reduce glimepiride dose. Gvoke ordered today. Received PCV20 today and referred to ophtho for updated eye exam. Strongly encouraged medication compliance as well as lifestyle modifications.   Adult hypothyroidism Recheck labs, encourage compliance.   OSA (obstructive sleep apnea) Encourage compliance with CPAP.  Hypertension Chronic. Currently within goal range however only took bystolic today. Question ongoing need for additional agents given known poor compliance although reports higher BP in the past and expressed resistance to regimen adjustment. Encouraged patient to monitor BP at home, will reassess in 3 months.  CHF (congestive heart failure) (HCC) Likely due for updated ECHO, encouraged Cardiology followup. Work at maintaining adequate glucose, BP, lipid control. Encourage CPAP compliance.  DCIS (ductal carcinoma in situ) Continue to follow with surgery.  Neutrophilia Obtaining labs today. Encourage Heme/Onc follow up.  Hyperlipidemia Recheck labs.  Morbid obesity ([place]) Contributing to poor control of diabetes, HTN, HLD. Encouraged modifications of lifestyle including diet and exercise. Consider nutrition referral at follow up if patient amenable.      Myles Gip, DO

## 2021-06-23 LAB — COMPREHENSIVE METABOLIC PANEL
ALT: 92 IU/L — ABNORMAL HIGH (ref 0–32)
AST: 38 IU/L (ref 0–40)
Albumin/Globulin Ratio: 1.6 (ref 1.2–2.2)
Albumin: 4.6 g/dL (ref 3.8–4.8)
Alkaline Phosphatase: 160 IU/L — ABNORMAL HIGH (ref 44–121)
BUN/Creatinine Ratio: 24 (ref 12–28)
BUN: 36 mg/dL — ABNORMAL HIGH (ref 8–27)
Bilirubin Total: 0.8 mg/dL (ref 0.0–1.2)
CO2: 20 mmol/L (ref 20–29)
Calcium: 10.2 mg/dL (ref 8.7–10.3)
Chloride: 99 mmol/L (ref 96–106)
Creatinine, Ser: 1.53 mg/dL — ABNORMAL HIGH (ref 0.57–1.00)
Globulin, Total: 2.9 g/dL (ref 1.5–4.5)
Glucose: 273 mg/dL — ABNORMAL HIGH (ref 70–99)
Potassium: 4.3 mmol/L (ref 3.5–5.2)
Sodium: 138 mmol/L (ref 134–144)
Total Protein: 7.5 g/dL (ref 6.0–8.5)
eGFR: 38 mL/min/{1.73_m2} — ABNORMAL LOW (ref >59)

## 2021-06-23 LAB — CBC WITH DIFFERENTIAL/PLATELET
Basophils Absolute: 0.1 10*3/uL (ref 0.0–0.2)
Basos: 1 %
EOS (ABSOLUTE): 0.2 10*3/uL (ref 0.0–0.4)
Eos: 2 %
Hematocrit: 44.8 % (ref 34.0–46.6)
Hemoglobin: 14.8 g/dL (ref 11.1–15.9)
Immature Grans (Abs): 0 10*3/uL (ref 0.0–0.1)
Immature Granulocytes: 0 %
Lymphocytes Absolute: 4 10*3/uL — ABNORMAL HIGH (ref 0.7–3.1)
Lymphs: 34 %
MCH: 32 pg (ref 26.6–33.0)
MCHC: 33 g/dL (ref 31.5–35.7)
MCV: 97 fL (ref 79–97)
Monocytes Absolute: 0.7 10*3/uL (ref 0.1–0.9)
Monocytes: 6 %
Neutrophils Absolute: 6.6 10*3/uL (ref 1.4–7.0)
Neutrophils: 57 %
Platelets: 170 10*3/uL (ref 150–450)
RBC: 4.63 x10E6/uL (ref 3.77–5.28)
RDW: 12.3 % (ref 11.7–15.4)
WBC: 11.6 10*3/uL — ABNORMAL HIGH (ref 3.4–10.8)

## 2021-06-23 LAB — LIPID PANEL
Chol/HDL Ratio: 3.4 ratio (ref 0.0–4.4)
Cholesterol, Total: 113 mg/dL (ref 100–199)
HDL: 33 mg/dL — ABNORMAL LOW (ref >39)
LDL Chol Calc (NIH): 60 mg/dL (ref 0–99)
Triglycerides: 110 mg/dL (ref 0–149)
VLDL Cholesterol Cal: 20 mg/dL (ref 5–40)

## 2021-06-23 LAB — HIV ANTIBODY (ROUTINE TESTING W REFLEX): HIV Screen 4th Generation wRfx: NONREACTIVE

## 2021-06-23 LAB — TSH: TSH: 7.02 u[IU]/mL — ABNORMAL HIGH (ref 0.450–4.500)

## 2021-06-23 NOTE — Assessment & Plan Note (Signed)
Recheck labs 

## 2021-06-23 NOTE — Assessment & Plan Note (Signed)
Uncontrolled and worsened, A1c 10.0 today. Longstanding h/o medication and treatment plan nonadherence which complicates care. Has failed multiple agents and with recent hypoglycemic episode on current regimen. Will refer to endocrinology for evaluation. Reduce glimepiride dose. Gvoke ordered today. Received PCV20 today and referred to ophtho for updated eye exam. Strongly encouraged medication compliance as well as lifestyle modifications.

## 2021-06-23 NOTE — Assessment & Plan Note (Signed)
Encourage compliance with CPAP.

## 2021-06-23 NOTE — Assessment & Plan Note (Signed)
Chronic. Currently within goal range however only took bystolic today. Question ongoing need for additional agents given known poor compliance although reports higher BP in the past and expressed resistance to regimen adjustment. Encouraged patient to monitor BP at home, will reassess in 3 months.

## 2021-06-23 NOTE — Assessment & Plan Note (Signed)
Contributing to poor control of diabetes, HTN, HLD. Encouraged modifications of lifestyle including diet and exercise. Consider nutrition referral at follow up if patient amenable.

## 2021-06-23 NOTE — Assessment & Plan Note (Signed)
Recheck labs, encourage compliance.

## 2021-06-23 NOTE — Assessment & Plan Note (Signed)
Continue to follow with surgery.

## 2021-06-23 NOTE — Assessment & Plan Note (Addendum)
Likely due for updated ECHO, encouraged Cardiology followup. Work at maintaining adequate glucose, BP, lipid control. Encourage CPAP compliance.

## 2021-06-23 NOTE — Assessment & Plan Note (Signed)
Obtaining labs today. Encourage Heme/Onc follow up.

## 2021-07-07 ENCOUNTER — Encounter: Payer: Self-pay | Admitting: Family Medicine

## 2021-07-12 ENCOUNTER — Ambulatory Visit: Payer: Self-pay

## 2021-07-12 NOTE — Telephone Encounter (Signed)
FYI

## 2021-07-12 NOTE — Telephone Encounter (Signed)
°  Chief Complaint: sinusitis Symptoms: brown/bloody drainage, facial pain around eyes, blocked nose, muffles ears, scratchy throat,  Frequency: constant Pertinent Negatives: Patient denies fever Disposition: [] ED /[] Urgent Care (no appt availability in office) / [x] Appointment(In office/virtual)/ []  [facility]/ [] Home Care/ [] Refused Recommended Disposition /[]  Mobile Bus/ []  Follow-up with PCP Additional Notes: in office appt made- pt was asking if something could be called in instead of OV.     Reason for Disposition  Earache  Answer Assessment - Initial Assessment Questions 1. LOCATION: "Where does it hurt?"      Around eyes 2. ONSET: "When did the sinus pain start?"  (e.g., hours, days)      1 week 3. SEVERITY: "How bad is the pain?"   (Scale 1-10; mild, moderate or severe)   - MILD (1-3): doesn't interfere with normal activities    - MODERATE (4-7): interferes with normal activities (e.g., work or school) or awakens from sleep   - SEVERE (8-10): excruciating pain and patient unable to do any normal activities        moderate 4. RECURRENT SYMPTOM: "Have you ever had sinus problems before?" If Yes, ask: "When was the last time?" and "What happened that time?"      Long time ago once a year- abx 5. NASAL CONGESTION: "Is the nose blocked?" If Yes, ask: "Can you open it or must you breathe through your mouth?"     blocked 6. NASAL DISCHARGE: "Do you have discharge from your nose?" If so ask, "What color?"     Brown to bloody 7. FEVER: "Do you have a fever?" If Yes, ask: "What is it, how was it measured, and when did it start?"      no 8. OTHER SYMPTOMS: "Do you have any other symptoms?" (e.g., sore throat, cough, earache, difficulty breathing)     Cough, muffled bilateral ears, scratchy 9. PREGNANCY: "Is there any chance you are pregnant?" "When was your last menstrual period?"     *No Answer*  Protocols used: Sinus Pain or Congestion-A-AH

## 2021-07-13 ENCOUNTER — Emergency Department: Payer: Medicare Other

## 2021-07-13 ENCOUNTER — Other Ambulatory Visit: Payer: Self-pay

## 2021-07-13 ENCOUNTER — Encounter: Payer: Self-pay | Admitting: Family Medicine

## 2021-07-13 ENCOUNTER — Ambulatory Visit (INDEPENDENT_AMBULATORY_CARE_PROVIDER_SITE_OTHER): Payer: Medicare Other | Admitting: Family Medicine

## 2021-07-13 ENCOUNTER — Inpatient Hospital Stay
Admission: EM | Admit: 2021-07-13 | Discharge: 2021-07-24 | DRG: 286 | Disposition: A | Discharge status: Home Health | Payer: Medicare Other | Attending: Internal Medicine | Admitting: Internal Medicine

## 2021-07-13 VITALS — BP 108/76 | HR 97 | Temp 97.5°F | Resp 16 | Wt 202.1 lb

## 2021-07-13 DIAGNOSIS — E039 Hypothyroidism, unspecified: Secondary | ICD-10-CM | POA: Diagnosis present

## 2021-07-13 DIAGNOSIS — I5021 Acute systolic (congestive) heart failure: Secondary | ICD-10-CM | POA: Diagnosis not present

## 2021-07-13 DIAGNOSIS — Z853 Personal history of malignant neoplasm of breast: Secondary | ICD-10-CM

## 2021-07-13 DIAGNOSIS — R0989 Other specified symptoms and signs involving the circulatory and respiratory systems: Secondary | ICD-10-CM

## 2021-07-13 DIAGNOSIS — R778 Other specified abnormalities of plasma proteins: Secondary | ICD-10-CM | POA: Diagnosis present

## 2021-07-13 DIAGNOSIS — R234 Changes in skin texture: Secondary | ICD-10-CM

## 2021-07-13 DIAGNOSIS — I5023 Acute on chronic systolic (congestive) heart failure: Secondary | ICD-10-CM | POA: Diagnosis present

## 2021-07-13 DIAGNOSIS — Z794 Long term (current) use of insulin: Secondary | ICD-10-CM

## 2021-07-13 DIAGNOSIS — K219 Gastro-esophageal reflux disease without esophagitis: Secondary | ICD-10-CM | POA: Diagnosis present

## 2021-07-13 DIAGNOSIS — J9601 Acute respiratory failure with hypoxia: Secondary | ICD-10-CM | POA: Diagnosis present

## 2021-07-13 DIAGNOSIS — L89892 Pressure ulcer of other site, stage 2: Secondary | ICD-10-CM | POA: Diagnosis present

## 2021-07-13 DIAGNOSIS — T733XXA Exhaustion due to excessive exertion, initial encounter: Secondary | ICD-10-CM

## 2021-07-13 DIAGNOSIS — E1165 Type 2 diabetes mellitus with hyperglycemia: Secondary | ICD-10-CM | POA: Diagnosis present

## 2021-07-13 DIAGNOSIS — I420 Dilated cardiomyopathy: Secondary | ICD-10-CM

## 2021-07-13 DIAGNOSIS — Z22322 Carrier or suspected carrier of Methicillin resistant Staphylococcus aureus: Secondary | ICD-10-CM

## 2021-07-13 DIAGNOSIS — Z20822 Contact with and (suspected) exposure to covid-19: Secondary | ICD-10-CM | POA: Diagnosis present

## 2021-07-13 DIAGNOSIS — I959 Hypotension, unspecified: Secondary | ICD-10-CM

## 2021-07-13 DIAGNOSIS — I2510 Atherosclerotic heart disease of native coronary artery without angina pectoris: Secondary | ICD-10-CM

## 2021-07-13 DIAGNOSIS — Z79899 Other long term (current) drug therapy: Secondary | ICD-10-CM

## 2021-07-13 DIAGNOSIS — I130 Hypertensive heart and chronic kidney disease with heart failure and stage 1 through stage 4 chronic kidney disease, or unspecified chronic kidney disease: Secondary | ICD-10-CM | POA: Diagnosis present

## 2021-07-13 DIAGNOSIS — Z95828 Presence of other vascular implants and grafts: Secondary | ICD-10-CM

## 2021-07-13 DIAGNOSIS — R0981 Nasal congestion: Secondary | ICD-10-CM

## 2021-07-13 DIAGNOSIS — I4892 Unspecified atrial flutter: Secondary | ICD-10-CM | POA: Diagnosis present

## 2021-07-13 DIAGNOSIS — I4891 Unspecified atrial fibrillation: Secondary | ICD-10-CM

## 2021-07-13 DIAGNOSIS — I509 Heart failure, unspecified: Secondary | ICD-10-CM

## 2021-07-13 DIAGNOSIS — I361 Nonrheumatic tricuspid (valve) insufficiency: Secondary | ICD-10-CM | POA: Diagnosis not present

## 2021-07-13 DIAGNOSIS — I081 Rheumatic disorders of both mitral and tricuspid valves: Secondary | ICD-10-CM | POA: Diagnosis present

## 2021-07-13 DIAGNOSIS — R0609 Other forms of dyspnea: Secondary | ICD-10-CM

## 2021-07-13 DIAGNOSIS — I700 Atherosclerosis of aorta: Secondary | ICD-10-CM | POA: Diagnosis present

## 2021-07-13 DIAGNOSIS — N179 Acute kidney failure, unspecified: Secondary | ICD-10-CM | POA: Diagnosis present

## 2021-07-13 DIAGNOSIS — R570 Cardiogenic shock: Secondary | ICD-10-CM | POA: Diagnosis present

## 2021-07-13 DIAGNOSIS — E785 Hyperlipidemia, unspecified: Secondary | ICD-10-CM | POA: Diagnosis present

## 2021-07-13 DIAGNOSIS — Z7984 Long term (current) use of oral hypoglycemic drugs: Secondary | ICD-10-CM

## 2021-07-13 DIAGNOSIS — I272 Pulmonary hypertension, unspecified: Secondary | ICD-10-CM | POA: Diagnosis present

## 2021-07-13 DIAGNOSIS — E119 Type 2 diabetes mellitus without complications: Secondary | ICD-10-CM

## 2021-07-13 DIAGNOSIS — N1832 Chronic kidney disease, stage 3b: Secondary | ICD-10-CM | POA: Diagnosis present

## 2021-07-13 DIAGNOSIS — G4733 Obstructive sleep apnea (adult) (pediatric): Secondary | ICD-10-CM | POA: Diagnosis present

## 2021-07-13 DIAGNOSIS — I5041 Acute combined systolic (congestive) and diastolic (congestive) heart failure: Secondary | ICD-10-CM | POA: Diagnosis not present

## 2021-07-13 DIAGNOSIS — I25118 Atherosclerotic heart disease of native coronary artery with other forms of angina pectoris: Secondary | ICD-10-CM | POA: Diagnosis present

## 2021-07-13 DIAGNOSIS — N189 Chronic kidney disease, unspecified: Secondary | ICD-10-CM | POA: Diagnosis not present

## 2021-07-13 DIAGNOSIS — Z833 Family history of diabetes mellitus: Secondary | ICD-10-CM

## 2021-07-13 DIAGNOSIS — I340 Nonrheumatic mitral (valve) insufficiency: Secondary | ICD-10-CM | POA: Diagnosis not present

## 2021-07-13 DIAGNOSIS — I5022 Chronic systolic (congestive) heart failure: Secondary | ICD-10-CM

## 2021-07-13 DIAGNOSIS — Z923 Personal history of irradiation: Secondary | ICD-10-CM

## 2021-07-13 DIAGNOSIS — E669 Obesity, unspecified: Secondary | ICD-10-CM | POA: Diagnosis present

## 2021-07-13 DIAGNOSIS — I4819 Other persistent atrial fibrillation: Secondary | ICD-10-CM | POA: Diagnosis present

## 2021-07-13 DIAGNOSIS — I429 Cardiomyopathy, unspecified: Secondary | ICD-10-CM | POA: Diagnosis not present

## 2021-07-13 DIAGNOSIS — Z8249 Family history of ischemic heart disease and other diseases of the circulatory system: Secondary | ICD-10-CM

## 2021-07-13 DIAGNOSIS — J90 Pleural effusion, not elsewhere classified: Secondary | ICD-10-CM

## 2021-07-13 DIAGNOSIS — L89152 Pressure ulcer of sacral region, stage 2: Secondary | ICD-10-CM | POA: Diagnosis present

## 2021-07-13 DIAGNOSIS — E1159 Type 2 diabetes mellitus with other circulatory complications: Secondary | ICD-10-CM | POA: Diagnosis not present

## 2021-07-13 DIAGNOSIS — Z9049 Acquired absence of other specified parts of digestive tract: Secondary | ICD-10-CM

## 2021-07-13 DIAGNOSIS — E1122 Type 2 diabetes mellitus with diabetic chronic kidney disease: Secondary | ICD-10-CM | POA: Diagnosis present

## 2021-07-13 DIAGNOSIS — Z79890 Hormone replacement therapy (postmenopausal): Secondary | ICD-10-CM

## 2021-07-13 DIAGNOSIS — J811 Chronic pulmonary edema: Secondary | ICD-10-CM

## 2021-07-13 DIAGNOSIS — I5043 Acute on chronic combined systolic (congestive) and diastolic (congestive) heart failure: Secondary | ICD-10-CM | POA: Diagnosis not present

## 2021-07-13 DIAGNOSIS — L8990 Pressure ulcer of unspecified site, unspecified stage: Secondary | ICD-10-CM | POA: Diagnosis present

## 2021-07-13 DIAGNOSIS — E876 Hypokalemia: Secondary | ICD-10-CM | POA: Diagnosis not present

## 2021-07-13 DIAGNOSIS — I255 Ischemic cardiomyopathy: Secondary | ICD-10-CM | POA: Diagnosis present

## 2021-07-13 DIAGNOSIS — E1169 Type 2 diabetes mellitus with other specified complication: Secondary | ICD-10-CM | POA: Diagnosis not present

## 2021-07-13 LAB — CBC
HCT: 48.1 % — ABNORMAL HIGH (ref 36.0–46.0)
Hemoglobin: 15.4 g/dL — ABNORMAL HIGH (ref 12.0–15.0)
MCH: 32.2 pg (ref 26.0–34.0)
MCHC: 32 g/dL (ref 30.0–36.0)
MCV: 100.4 fL — ABNORMAL HIGH (ref 80.0–100.0)
Platelets: 218 10*3/uL (ref 150–400)
RBC: 4.79 MIL/uL (ref 3.87–5.11)
RDW: 15.5 % (ref 11.5–15.5)
WBC: 10 10*3/uL (ref 4.0–10.5)
nRBC: 0.2 % (ref 0.0–0.2)

## 2021-07-13 LAB — RESP PANEL BY RT-PCR (FLU A&B, COVID) ARPGX2
Influenza A by PCR: NEGATIVE
Influenza B by PCR: NEGATIVE
SARS Coronavirus 2 by RT PCR: NEGATIVE

## 2021-07-13 LAB — TROPONIN I (HIGH SENSITIVITY)
Troponin I (High Sensitivity): 25 ng/L — ABNORMAL HIGH (ref <18)
Troponin I (High Sensitivity): 25 ng/L — ABNORMAL HIGH (ref <18)

## 2021-07-13 LAB — BASIC METABOLIC PANEL
Anion gap: 13 (ref 5–15)
BUN: 43 mg/dL — ABNORMAL HIGH (ref 8–23)
CO2: 19 mmol/L — ABNORMAL LOW (ref 22–32)
Calcium: 8.4 mg/dL — ABNORMAL LOW (ref 8.9–10.3)
Chloride: 102 mmol/L (ref 98–111)
Creatinine, Ser: 1.76 mg/dL — ABNORMAL HIGH (ref 0.44–1.00)
GFR, Estimated: 32 mL/min — ABNORMAL LOW (ref >60)
Glucose, Bld: 298 mg/dL — ABNORMAL HIGH (ref 70–99)
Potassium: 3.9 mmol/L (ref 3.5–5.1)
Sodium: 134 mmol/L — ABNORMAL LOW (ref 135–145)

## 2021-07-13 MED ORDER — CEFTRIAXONE SODIUM 1 G IJ SOLR
1.0000 g | Freq: Once | INTRAMUSCULAR | Status: AC
  Administered 2021-07-13: 1 g via INTRAVENOUS
  Filled 2021-07-13: qty 10

## 2021-07-13 MED ORDER — NOREPINEPHRINE 4 MG/250ML-% IV SOLN
0.0000 ug/min | INTRAVENOUS | Status: DC
  Administered 2021-07-13: 2 ug/min via INTRAVENOUS
  Filled 2021-07-13: qty 250

## 2021-07-13 MED ORDER — DOCUSATE SODIUM 100 MG PO CAPS: 100.0000 mg | ORAL_CAPSULE | Freq: Two times a day (BID) | ORAL | Status: DC | PRN

## 2021-07-13 MED ORDER — POLYETHYLENE GLYCOL 3350 17 G PO PACK
17.0000 g | PACK | Freq: Every day | ORAL | Status: DC | PRN
Start: 2021-07-13 — End: 2021-07-24

## 2021-07-13 MED ORDER — IOHEXOL 350 MG/ML SOLN
60.0000 mL | Freq: Once | INTRAVENOUS | Status: AC | PRN
  Administered 2021-07-13: 60 mL via INTRAVENOUS

## 2021-07-13 MED ORDER — SODIUM CHLORIDE 0.9 % IV SOLN
500.0000 mg | Freq: Once | INTRAVENOUS | Status: AC
  Administered 2021-07-13: 500 mg via INTRAVENOUS
  Filled 2021-07-13: qty 5

## 2021-07-13 NOTE — Assessment & Plan Note (Signed)
BMI 36 Discussed importance of healthy weight management Discussed diet and exercise

## 2021-07-13 NOTE — Progress Notes (Signed)
Established patient visit   Patient: Michele [name]   DOB: 1956/05/20   66 y.o. Female  MRN: [id number] Visit Date: 07/13/2021  Today's healthcare provider: Gwyneth Sprout, FNP   Chief Complaint  Patient presents with   Sinus Problem   URI   Subjective    Sinusitis This is a new problem. The current episode started in the past 7 days. The problem has been gradually worsening since onset. There has been no fever. Associated symptoms include congestion, coughing, ear pain, headaches, a hoarse voice, neck pain, shortness of breath (which patient states is worsening), sinus pressure and a sore throat. Pertinent negatives include no chills, diaphoresis, sneezing or swollen glands. Past treatments include nothing.  Rash This is a new problem. The current episode started yesterday. The problem is unchanged. The affected locations include the right foot and left foot. The rash is characterized by pain. Associated symptoms include congestion, coughing, diarrhea, shortness of breath (which patient states is worsening) and a sore throat.     Medications: Outpatient Medications Prior to Visit  Medication Sig   allopurinol (ZYLOPRIM) 300 MG tablet Take 1 tablet (300 mg total) by mouth daily.   amLODipine (NORVASC) 10 MG tablet TAKE 1 TABLET BY MOUTH  DAILY   Blood Glucose Monitoring Suppl (ONE TOUCH ULTRA SYSTEM KIT) w/Device KIT Check sugar once daily. DX N30.0   BYSTOLIC 20 MG TABS TAKE 1 TABLET BY MOUTH  DAILY   cetirizine (ZYRTEC) 10 MG tablet Take 1 tablet (10 mg total) by mouth daily.   fluticasone (FLONASE) 50 MCG/ACT nasal spray Place into the nose.   glimepiride (AMARYL) 4 MG tablet TAKE 1 TABLET BY MOUTH  TWICE DAILY   Glucagon (GVOKE HYPOPEN 1-PACK) 1 MG/0.2ML SOAJ Inject 1 mg into the skin every 15 (fifteen) minutes as needed for up to 5 doses.   glucose blood (ONE TOUCH ULTRA TEST) test strip Use as instructed   insulin glargine (LANTUS SOLOSTAR) 100 UNIT/ML Solostar Pen INJECT  50 UNITS UNDER THE SKIN TWICE DAILY   Insulin Pen Needle 32G X 4 MM MISC Inject twice daily, SQ. DX E11.9   Lancets (ONETOUCH ULTRASOFT) lancets Check sugar once daily DX E11.9   levothyroxine (SYNTHROID) 200 MCG tablet TAKE 1 TABLET(200 MCG) BY MOUTH DAILY BEFORE BREAKFAST   losartan (COZAAR) 100 MG tablet TAKE 1 TABLET BY MOUTH  DAILY   NON FORMULARY CPAP AT BEDTIME   rosuvastatin (CRESTOR) 10 MG tablet TAKE 1 TABLET BY MOUTH  DAILY   Syringe/Needle, Disp, (SYRINGE 3CC/25GX1") 25G X 1" 3 ML MISC 1 mL by Does not apply route every 30 (thirty) days.   No facility-administered medications prior to visit.    Review of Systems  Constitutional:  Negative for chills and diaphoresis.  HENT:  Positive for congestion, ear pain, hoarse voice, nosebleeds, sinus pressure, sinus pain and sore throat. Negative for mouth sores and sneezing.   Respiratory:  Positive for cough and shortness of breath (which patient states is worsening).   Cardiovascular:  Negative for chest pain.  Gastrointestinal:  Positive for diarrhea.  Genitourinary: Negative.   Musculoskeletal:  Positive for arthralgias and neck pain. Negative for neck stiffness.  Skin:  Positive for rash.  Neurological:  Positive for weakness and headaches.      Objective    BP 108/76    Pulse 97    Temp (!) 97.5 F (36.4 C) (Oral)    Resp 16    Wt 202 lb 1.6 oz (91.7  kg)    SpO2 97%    BMI 36.96 kg/m    Physical Exam Vitals and nursing note reviewed.  Constitutional:      General: She is not in acute distress.    Appearance: Normal appearance. She is obese. She is not ill-appearing, toxic-appearing or diaphoretic.  HENT:     Head: Normocephalic and atraumatic.     Right Ear: Tympanic membrane, ear canal and external ear normal.     Left Ear: Tympanic membrane, ear canal and external ear normal.     Nose: Rhinorrhea present.     Mouth/Throat:     Mouth: Mucous membranes are moist.     Pharynx: No oropharyngeal exudate or posterior  oropharyngeal erythema.  Eyes:     General:        Right eye: No discharge.        Left eye: No discharge.  Cardiovascular:     Rate and Rhythm: Normal rate and regular rhythm.     Pulses:          Dorsalis pedis pulses are 1+ on the right side and 1+ on the left side.       Posterior tibial pulses are 1+ on the right side and 1+ on the left side.     Heart sounds: Normal heart sounds. No murmur heard.   No friction rub. No gallop.  Pulmonary:     Effort: Pulmonary effort is normal. No respiratory distress.     Breath sounds: Normal breath sounds. No stridor. No wheezing, rhonchi or rales.  Chest:     Chest wall: No tenderness.  Abdominal:     General: Bowel sounds are normal.     Palpations: Abdomen is soft.  Musculoskeletal:        General: No swelling, tenderness, deformity or signs of injury. Normal range of motion.     Right lower leg: No edema.     Left lower leg: No edema.       Feet:  Feet:     Right foot:     Protective Sensation: 10 sites tested.  10 sites sensed.     Skin integrity: Skin breakdown, erythema and dry skin present.     Toenail Condition: Right toenails are normal.     Left foot:     Protective Sensation: 10 sites tested.  10 sites sensed.     Skin integrity: Skin breakdown, erythema, dry skin and fissure present.     Toenail Condition: Left toenails are normal.  Skin:    General: Skin is warm and dry.     Capillary Refill: Capillary refill takes less than 2 seconds.     Coloration: Skin is not jaundiced or pale.     Findings: No bruising, erythema, lesion or rash.       Neurological:     General: No focal deficit present.     Mental Status: She is alert and oriented to person, place, and time. Mental status is at baseline.     Cranial Nerves: No cranial nerve deficit.     Sensory: No sensory deficit.     Motor: No weakness.     Coordination: Coordination normal.  Psychiatric:        Mood and Affect: Mood is depressed. Affect is flat.         Behavior: Behavior normal.        Thought Content: Thought content normal.        Judgment: Judgment normal.     Results  for orders placed or performed during the hospital encounter of 07/13/21  Resp Panel by RT-PCR (Flu A&B, Covid) Nasopharyngeal Swab   Specimen: Nasopharyngeal Swab; Nasopharyngeal(NP) swabs in vial transport medium  Result Value Ref Range   SARS Coronavirus 2 by RT PCR NEGATIVE NEGATIVE   Influenza A by PCR NEGATIVE NEGATIVE   Influenza B by PCR NEGATIVE NEGATIVE  Basic metabolic panel  Result Value Ref Range   Sodium 134 (L) 135 - 145 mmol/L   Potassium 3.9 3.5 - 5.1 mmol/L   Chloride 102 98 - 111 mmol/L   CO2 19 (L) 22 - 32 mmol/L   Glucose, Bld 298 (H) 70 - 99 mg/dL   BUN 43 (H) 8 - 23 mg/dL   Creatinine, Ser 1.76 (H) 0.44 - 1.00 mg/dL   Calcium 8.4 (L) 8.9 - 10.3 mg/dL   GFR, Estimated 32 (L) >60 mL/min   Anion gap 13 5 - 15  CBC  Result Value Ref Range   WBC 10.0 4.0 - 10.5 K/uL   RBC 4.79 3.87 - 5.11 MIL/uL   Hemoglobin 15.4 (H) 12.0 - 15.0 g/dL   HCT 48.1 (H) 36.0 - 46.0 %   MCV 100.4 (H) 80.0 - 100.0 fL   MCH 32.2 26.0 - 34.0 pg   MCHC 32.0 30.0 - 36.0 g/dL   RDW 15.5 11.5 - 15.5 %   Platelets 218 150 - 400 K/uL   nRBC 0.2 0.0 - 0.2 %  Troponin I (High Sensitivity)  Result Value Ref Range   Troponin I (High Sensitivity) 25 (H) <18 ng/L    Assessment & Plan     Problem List Items Addressed This Visit       Cardiovascular and Mediastinum   CHF (congestive heart failure) ([place])    Has est'd with cards; however, appt is not until 2/7      New onset a-fib ([place]) - Primary    Associated symptoms include fatigue, SOB, and DOE EKG done      Atrial fibrillation with RVR (HCC)    New onset, HR 90-147s         Respiratory   Sinus congestion    Slight pressure on exam; complaints of 1 week of symptoms Recommend additional f/u following ED        Musculoskeletal and Integument   Fissure in skin of foot    L great toe; referral to  podiatry; topical lotion provided in office      Relevant Orders   Ambulatory referral to Podiatry     Other   Morbid obesity ([place])    BMI 36 Discussed importance of healthy weight management Discussed diet and exercise       Dyspnea on exertion    LCTAB; no active wheezing; complaints of sinus congestion- no rhonchi 2 view ordered; cancelled for ED visit      Relevant Orders   EKG 12-Lead (Completed)   Fatigue due to excessive exertion    D/t new onset Afib w/ ass RVR        Return in about 3 months (around 10/11/2021).      Vonna Kotyk, FNP, have reviewed all documentation for this visit. The documentation on 07/13/21 for the exam, diagnosis, procedures, and orders are all accurate and complete.    Gwyneth Sprout, [place] [phone] (phone) [phone] (fax)  [place]

## 2021-07-13 NOTE — Assessment & Plan Note (Signed)
LCTAB; no active wheezing; complaints of sinus congestion- no rhonchi 2 view ordered; cancelled for ED visit

## 2021-07-13 NOTE — ED Notes (Signed)
Pt to CT monitored with Annie Main, RN.

## 2021-07-13 NOTE — ED Notes (Signed)
RT contacted to place pt on BiPAP

## 2021-07-13 NOTE — ED Notes (Signed)
Pt ambulated to the restroom with the assistance of this RN. Pt provided a callbell next to the commode to use once she is finished in the restroom.

## 2021-07-13 NOTE — Assessment & Plan Note (Signed)
Has est'd with cards; however, appt is not until 2/7

## 2021-07-13 NOTE — ED Provider Triage Note (Signed)
Emergency Medicine Provider Triage Evaluation Note  Rayola Everhart, a 66 y.o. female  was evaluated in triage.  Pt complains of shortness of breath and weakness.  Patient presents from her PCPs office, for evaluation of new onset A. fib with RVR.  She reports not feeling well for the last several days.  Review of Systems  Positive: SOB weakness Negative: FCS  Physical Exam  BP 100/84    Pulse (!) 145    Resp (!) 22    Ht 5\' 2"  (1.575 m)    Wt 91.6 kg    SpO2 100%    BMI 36.95 kg/m  Gen:   Awake, no distress   Resp:  Normal effort CTA MSK:   Moves extremities without difficulty  Other:  CVS: irreg, rate 47-128 bpm  Medical Decision Making  Medically screening exam initiated at 5:12 PM.  Appropriate orders placed.  Yelina Sarratt was informed that the remainder of the evaluation will be completed by another provider, this initial triage assessment does not replace that evaluation, and the importance of remaining in the ED until their evaluation is complete.  Patient to the ED for evaluation of generalized weakness and shortness of breath, found to have new onset A. fib.  She presents from her providers office with A. fib RVR.   Melvenia Needles, PA-C 07/13/21 1715

## 2021-07-13 NOTE — Assessment & Plan Note (Signed)
Slight pressure on exam; complaints of 1 week of symptoms Recommend additional f/u following ED

## 2021-07-13 NOTE — Assessment & Plan Note (Signed)
L great toe; referral to podiatry; topical lotion provided in office

## 2021-07-13 NOTE — ED Notes (Signed)
Pt placed on 2L [place] for comfort - MD made aware.

## 2021-07-13 NOTE — Patient Instructions (Signed)
GO TO ED- TODAY

## 2021-07-13 NOTE — Assessment & Plan Note (Signed)
New onset, HR 90-147s

## 2021-07-13 NOTE — ED Notes (Signed)
Pt back from CT

## 2021-07-13 NOTE — ED Triage Notes (Signed)
Pt to ED for shob, overall not feeling well for the past few days.

## 2021-07-13 NOTE — ED Provider Notes (Signed)
[facility] Provider Note    Event Date/Time   First MD Initiated Contact with Patient 07/13/21 1820     (approximate)   History   Shortness of Breath   HPI  Michele [name] is a 66 y.o. female  who, according to family practice note from earlier today has history of CHF and was being seen for sinus congestion, who presents to the ED after having been found to be in atrial fibrillation with RVR at family practice clinic.  The patient is herself states that she has not been feeling well for couple of months.  She has been gradually feeling more weak.  However the past few days she has noticed significant increase in her weakness and shortness of breath.  Patient states she has a history of heart failure and had at one time been on diuretics.  She denies any recent fevers.      Physical Exam   Triage Vital Signs: ED Triage Vitals  Enc Vitals Group     BP 07/13/21 1707 100/84     Pulse Rate 07/13/21 1705 (!) 145     Resp 07/13/21 1705 (!) 22     Temp 07/13/21 1756 97.6 F (36.4 C)     Temp Source 07/13/21 1756 Oral     SpO2 07/13/21 1705 100 %     Weight 07/13/21 1707 202 lb (91.6 kg)     Height 07/13/21 1707 5\' 2"  (1.575 m)     Head Circumference --      Peak Flow --      Pain Score 07/13/21 1706 0     Pain Loc --      Pain Edu? --      Excl. in [place]? --     Most recent vital signs: Vitals:   07/13/21 2208 07/13/21 2212  BP: 112/84 114/87  Pulse:    Resp: (!) 24 (!) 21  Temp:    SpO2:      General: Awake, no distress.  CV:  Tachycardic, irregular rhythm Resp:  Increased respiratory effort. No crackles or rhonchi. Abd:  No distention.   ED Results / Procedures / Treatments   Labs (all labs ordered are listed, but only abnormal results are displayed) Labs Reviewed  BASIC METABOLIC PANEL - Abnormal; Notable for the following components:      Result Value   Sodium 134 (*)    CO2 19 (*)    Glucose, Bld 298 (*)    BUN 43 (*)     Creatinine, Ser 1.76 (*)    Calcium 8.4 (*)    GFR, Estimated 32 (*)    All other components within normal limits  CBC - Abnormal; Notable for the following components:   Hemoglobin 15.4 (*)    HCT 48.1 (*)    MCV 100.4 (*)    All other components within normal limits  TROPONIN I (HIGH SENSITIVITY) - Abnormal; Notable for the following components:   Troponin I (High Sensitivity) 25 (*)    All other components within normal limits  TROPONIN I (HIGH SENSITIVITY) - Abnormal; Notable for the following components:   Troponin I (High Sensitivity) 25 (*)    All other components within normal limits  RESP PANEL BY RT-PCR (FLU A&B, COVID) [id number]     EKG  I, Nance Pear, attending physician, personally viewed and interpreted this EKG  EKG Time: 1712 Rate: 148 Rhythm: atrial fibrillation with rvr Axis: normal axis deviation Intervals: qtc 536 QRS: q waves v1,  v2, v3 ST changes: no st elevation Impression: abnormal ekg  RADIOLOGY CXR My interpretation: Cardiomegaly Radiology interpretation: IMPRESSION:  Cardiac enlargement with mild vascular congestion     Mild left lower lobe atelectasis or infiltrate.         CT angio chest IMPRESSION:  1. Incomplete opacification of segmental branches of the right lower  lobe pulmonary artery. Favor flow related phenomenon and mixing  artifact over nonocclusive thrombus. However, PE cannot be excluded.  2. Cardiomegaly, with scattered ground-glass opacities and small  bilateral effusions consistent with mild congestive heart failure.  3.  Aortic Atherosclerosis (ICD10-I70.0).     PROCEDURES:  Critical Care performed: Yes, see critical care procedure note(s)  Procedures  CRITICAL CARE Performed by: Nance Pear   Total critical care time: 40 minutes  Critical care time was exclusive of separately billable procedures and treating other patients.  Critical care was necessary to treat or prevent imminent or  life-threatening deterioration.  Critical care was time spent personally by me on the following activities: development of treatment plan with patient and/or surrogate as well as nursing, discussions with consultants, evaluation of patient's response to treatment, examination of patient, obtaining history from patient or surrogate, ordering and performing treatments and interventions, ordering and review of laboratory studies, ordering and review of radiographic studies, pulse oximetry and re-evaluation of patient's condition.    MEDICATIONS ORDERED IN ED: Medications  norepinephrine (LEVOPHED) 4mg  in 273mL (0.016 mg/mL) premix infusion (4 mcg/min Intravenous Rate/Dose Change 07/13/21 2220)  cefTRIAXone (ROCEPHIN) 1 g in sodium chloride 0.9 % 100 mL IVPB (0 g Intravenous Stopped 07/13/21 2012)  azithromycin (ZITHROMAX) 500 mg in sodium chloride 0.9 % 250 mL IVPB (0 mg Intravenous Stopped 07/13/21 2053)  iohexol (OMNIPAQUE) 350 MG/ML injection 60 mL (60 mLs Intravenous Contrast Given 07/13/21 2207)     IMPRESSION / MDM / ASSESSMENT AND PLAN / ED COURSE  I reviewed the triage vital signs and the nursing notes.                              Differential diagnosis includes, but is not limited to, pneumonia, ptx, pe.  Patient presented to the emergency department today because of concerns for atrial fibrillation found on family practice EKG performed earlier today.  Patient herself complains of worsening shortness of breath.  On exam patient is in A. fib with RVR.  However patient's blood pressures were low.  I did have some hesitancy starting high-dose medication to help slow down her rhythm.  I discussed this with the patient.  Will start with IV fluids.  Initial chest x-ray was concerning for possible pneumonia.  Because of this and low blood pressure did start patient on antibiotics.  However no leukocytosis on blood work.  Given persistent hypotension I did start the patient on Levophed.  At that point  I did have concern for possible cardiogenic shock.  Did obtain CT angio.  Did discuss with the radiologist incomplete filling of one of the pulmonary arteries.  However they did think more likely due to poor contrast rather than true PE.  At this time patient not complaining of any significant chest pain.  I do have lower suspicion for PE.  Did discuss with ICU provider.  Patient will be admitted to the ICU.     FINAL CLINICAL IMPRESSION(S) / ED DIAGNOSES   Final diagnoses:  Hypotension, unspecified hypotension type  Atrial fibrillation, unspecified type ([place])  Note:  This document was prepared using Dragon voice recognition software and may include unintentional dictation errors.    Nance Pear, MD 07/14/21 [phone]

## 2021-07-13 NOTE — Assessment & Plan Note (Signed)
Associated symptoms include fatigue, SOB, and DOE EKG done

## 2021-07-13 NOTE — Assessment & Plan Note (Signed)
D/t new onset Afib w/ ass RVR

## 2021-07-14 ENCOUNTER — Inpatient Hospital Stay (HOSPITAL_COMMUNITY)
Admit: 2021-07-14 | Discharge: 2021-07-14 | Disposition: A | Discharge status: Home / Self Care | Payer: Medicare Other | Attending: Nurse Practitioner | Admitting: Nurse Practitioner

## 2021-07-14 ENCOUNTER — Inpatient Hospital Stay: Payer: Medicare Other

## 2021-07-14 DIAGNOSIS — L8990 Pressure ulcer of unspecified site, unspecified stage: Secondary | ICD-10-CM | POA: Diagnosis present

## 2021-07-14 DIAGNOSIS — I4891 Unspecified atrial fibrillation: Secondary | ICD-10-CM

## 2021-07-14 DIAGNOSIS — E1159 Type 2 diabetes mellitus with other circulatory complications: Secondary | ICD-10-CM | POA: Diagnosis not present

## 2021-07-14 DIAGNOSIS — R570 Cardiogenic shock: Secondary | ICD-10-CM

## 2021-07-14 DIAGNOSIS — N189 Chronic kidney disease, unspecified: Secondary | ICD-10-CM

## 2021-07-14 DIAGNOSIS — I429 Cardiomyopathy, unspecified: Secondary | ICD-10-CM | POA: Diagnosis not present

## 2021-07-14 DIAGNOSIS — I5041 Acute combined systolic (congestive) and diastolic (congestive) heart failure: Secondary | ICD-10-CM

## 2021-07-14 DIAGNOSIS — I420 Dilated cardiomyopathy: Secondary | ICD-10-CM

## 2021-07-14 DIAGNOSIS — N179 Acute kidney failure, unspecified: Secondary | ICD-10-CM

## 2021-07-14 LAB — MAGNESIUM: Magnesium: 2.2 mg/dL (ref 1.7–2.4)

## 2021-07-14 LAB — ECHOCARDIOGRAM COMPLETE
AR max vel: 2.21 cm2
AV Area VTI: 2.95 cm2
AV Area mean vel: 2.48 cm2
AV Mean grad: 0 mmHg
AV Peak grad: 0.9 mmHg
Ao pk vel: 0.47 m/s
Area-P 1/2: 6.48 cm2
Calc EF: 8.5 %
Height: 62 in
S' Lateral: 5.1 cm
Single Plane A2C EF: 2.7 %
Single Plane A4C EF: 12.6 %
Weight: 3280.44 oz

## 2021-07-14 LAB — PROTIME-INR
INR: 1.4 — ABNORMAL HIGH (ref 0.8–1.2)
Prothrombin Time: 17.2 seconds — ABNORMAL HIGH (ref 11.4–15.2)

## 2021-07-14 LAB — BASIC METABOLIC PANEL
Anion gap: 13 (ref 5–15)
BUN: 42 mg/dL — ABNORMAL HIGH (ref 8–23)
CO2: 17 mmol/L — ABNORMAL LOW (ref 22–32)
Calcium: 7.9 mg/dL — ABNORMAL LOW (ref 8.9–10.3)
Chloride: 106 mmol/L (ref 98–111)
Creatinine, Ser: 1.8 mg/dL — ABNORMAL HIGH (ref 0.44–1.00)
GFR, Estimated: 31 mL/min — ABNORMAL LOW (ref >60)
Glucose, Bld: 202 mg/dL — ABNORMAL HIGH (ref 70–99)
Potassium: 4 mmol/L (ref 3.5–5.1)
Sodium: 136 mmol/L (ref 135–145)

## 2021-07-14 LAB — APTT: aPTT: 30 seconds (ref 24–36)

## 2021-07-14 LAB — HEPARIN LEVEL (UNFRACTIONATED)
Heparin Unfractionated: 0.78 IU/mL — ABNORMAL HIGH (ref 0.30–0.70)
Heparin Unfractionated: >1.1 IU/mL — ABNORMAL HIGH (ref 0.30–0.70)

## 2021-07-14 LAB — GLUCOSE, CAPILLARY
Glucose-Capillary: 122 mg/dL — ABNORMAL HIGH (ref 70–99)
Glucose-Capillary: 144 mg/dL — ABNORMAL HIGH (ref 70–99)
Glucose-Capillary: 173 mg/dL — ABNORMAL HIGH (ref 70–99)
Glucose-Capillary: 182 mg/dL — ABNORMAL HIGH (ref 70–99)
Glucose-Capillary: 220 mg/dL — ABNORMAL HIGH (ref 70–99)
Glucose-Capillary: 92 mg/dL (ref 70–99)

## 2021-07-14 LAB — PROTEIN, TOTAL: Total Protein: 6.3 g/dL — ABNORMAL LOW (ref 6.5–8.1)

## 2021-07-14 LAB — PHOSPHORUS: Phosphorus: 3.6 mg/dL (ref 2.5–4.6)

## 2021-07-14 LAB — FERRITIN: Ferritin: 1175 ng/mL — ABNORMAL HIGH (ref 11–307)

## 2021-07-14 LAB — D-DIMER, QUANTITATIVE: D-Dimer, Quant: 2.54 ug/mL-FEU — ABNORMAL HIGH (ref 0.00–0.50)

## 2021-07-14 LAB — C-REACTIVE PROTEIN: CRP: 6 mg/dL — ABNORMAL HIGH (ref <1.0)

## 2021-07-14 LAB — LACTIC ACID, PLASMA
Lactic Acid, Venous: 3.9 mmol/L (ref 0.5–1.9)
Lactic Acid, Venous: 4.2 mmol/L (ref 0.5–1.9)

## 2021-07-14 LAB — T4, FREE: Free T4: 0.65 ng/dL (ref 0.61–1.12)

## 2021-07-14 LAB — BRAIN NATRIURETIC PEPTIDE: B Natriuretic Peptide: 738.3 pg/mL — ABNORMAL HIGH (ref 0.0–100.0)

## 2021-07-14 LAB — TSH: TSH: 8.127 u[IU]/mL — ABNORMAL HIGH (ref 0.350–4.500)

## 2021-07-14 LAB — LACTATE DEHYDROGENASE: LDH: 346 U/L — ABNORMAL HIGH (ref 98–192)

## 2021-07-14 LAB — PROCALCITONIN
Procalcitonin: <0.1 ng/mL
Procalcitonin: 0.18 ng/mL

## 2021-07-14 LAB — MRSA NEXT GEN BY PCR, NASAL: MRSA by PCR Next Gen: DETECTED — AB

## 2021-07-14 MED ORDER — AMIODARONE HCL IN DEXTROSE 360-4.14 MG/200ML-% IV SOLN: 60.0000 mg/h | INTRAVENOUS | Status: DC

## 2021-07-14 MED ORDER — METOPROLOL TARTRATE 25 MG PO TABS
12.5000 mg | ORAL_TABLET | Freq: Two times a day (BID) | ORAL | Status: DC
  Administered 2021-07-14 – 2021-07-15 (×3): 12.5 mg via ORAL
  Filled 2021-07-14 (×3): qty 1

## 2021-07-14 MED ORDER — COLCHICINE 0.6 MG PO TABS
0.6000 mg | ORAL_TABLET | Freq: Every day | ORAL | Status: DC
  Administered 2021-07-14 – 2021-07-15 (×2): 0.6 mg via ORAL
  Filled 2021-07-14 (×2): qty 1

## 2021-07-14 MED ORDER — PHENYLEPHRINE HCL-NACL 20-0.9 MG/250ML-% IV SOLN
25.0000 ug/min | INTRAVENOUS | Status: DC
  Administered 2021-07-14: 07:00:00 60 ug/min via INTRAVENOUS
  Administered 2021-07-14: 13:00:00 50 ug/min via INTRAVENOUS
  Administered 2021-07-14: 60 ug/min via INTRAVENOUS
  Administered 2021-07-14: 20:00:00 50 ug/min via INTRAVENOUS
  Filled 2021-07-14 (×5): qty 250

## 2021-07-14 MED ORDER — LEVOTHYROXINE SODIUM 100 MCG/5ML IV SOLN
100.0000 ug | Freq: Every day | INTRAVENOUS | Status: DC
Start: 2021-07-14 — End: 2021-07-14

## 2021-07-14 MED ORDER — SODIUM CHLORIDE 0.9 % IV SOLN: 250.0000 mL | INTRAVENOUS | Status: DC

## 2021-07-14 MED ORDER — METHYLPREDNISOLONE SODIUM SUCC 40 MG IJ SOLR
40.0000 mg | Freq: Two times a day (BID) | INTRAMUSCULAR | Status: DC
  Administered 2021-07-14 – 2021-07-20 (×13): 40 mg via INTRAVENOUS
  Filled 2021-07-14 (×13): qty 1

## 2021-07-14 MED ORDER — SODIUM CHLORIDE 0.9 % IV SOLN
500.0000 mg | INTRAVENOUS | Status: DC
  Filled 2021-07-14: qty 5

## 2021-07-14 MED ORDER — CHLORHEXIDINE GLUCONATE CLOTH 2 % EX PADS
6.0000 | MEDICATED_PAD | Freq: Every day | CUTANEOUS | Status: AC
  Administered 2021-07-14 – 2021-07-18 (×3): 6 via TOPICAL

## 2021-07-14 MED ORDER — HEPARIN (PORCINE) 25000 UT/250ML-% IV SOLN
900.0000 [IU]/h | INTRAVENOUS | Status: DC
  Administered 2021-07-14: 1000 [IU]/h via INTRAVENOUS
  Filled 2021-07-14: qty 250

## 2021-07-14 MED ORDER — INSULIN ASPART 100 UNIT/ML IJ SOLN
0.0000 [IU] | INTRAMUSCULAR | Status: DC
  Administered 2021-07-14: 4 [IU] via SUBCUTANEOUS
  Administered 2021-07-14: 3 [IU] via SUBCUTANEOUS
  Administered 2021-07-14: 4 [IU] via SUBCUTANEOUS
  Filled 2021-07-14 (×3): qty 1

## 2021-07-14 MED ORDER — SODIUM CHLORIDE 0.9 % IV SOLN
250.0000 mL | INTRAVENOUS | Status: DC
  Administered 2021-07-17: 250 mL via INTRAVENOUS

## 2021-07-14 MED ORDER — HEPARIN (PORCINE) 25000 UT/250ML-% IV SOLN
1150.0000 [IU]/h | INTRAVENOUS | Status: DC
  Administered 2021-07-14: 1200 [IU]/h via INTRAVENOUS
  Filled 2021-07-14: qty 250

## 2021-07-14 MED ORDER — LEVOTHYROXINE SODIUM 100 MCG PO TABS
200.0000 ug | ORAL_TABLET | Freq: Every day | ORAL | Status: DC
  Administered 2021-07-14 – 2021-07-24 (×11): 200 ug via ORAL
  Filled 2021-07-14 (×12): qty 2

## 2021-07-14 MED ORDER — CHLORHEXIDINE GLUCONATE CLOTH 2 % EX PADS: 6.0000 | MEDICATED_PAD | Freq: Every day | CUTANEOUS | Status: DC

## 2021-07-14 MED ORDER — AMIODARONE HCL IN DEXTROSE 360-4.14 MG/200ML-% IV SOLN: 30.0000 mg/h | INTRAVENOUS | Status: DC

## 2021-07-14 MED ORDER — FUROSEMIDE 10 MG/ML IJ SOLN
40.0000 mg | Freq: Once | INTRAMUSCULAR | Status: AC
  Administered 2021-07-14: 40 mg via INTRAVENOUS
  Filled 2021-07-14: qty 4

## 2021-07-14 MED ORDER — BUDESONIDE 0.25 MG/2ML IN SUSP
0.2500 mg | Freq: Two times a day (BID) | RESPIRATORY_TRACT | Status: DC
  Administered 2021-07-14 – 2021-07-16 (×6): 0.25 mg via RESPIRATORY_TRACT
  Filled 2021-07-14 (×6): qty 2

## 2021-07-14 MED ORDER — MIDODRINE HCL 5 MG PO TABS
10.0000 mg | ORAL_TABLET | Freq: Three times a day (TID) | ORAL | Status: DC
  Administered 2021-07-14: 10 mg via ORAL
  Filled 2021-07-14: qty 2

## 2021-07-14 MED ORDER — HEPARIN BOLUS VIA INFUSION
5000.0000 [IU] | Freq: Once | INTRAVENOUS | Status: AC
  Administered 2021-07-14: 5000 [IU] via INTRAVENOUS
  Filled 2021-07-14: qty 5000

## 2021-07-14 MED ORDER — NOREPINEPHRINE 4 MG/250ML-% IV SOLN
2.0000 ug/min | INTRAVENOUS | Status: DC
  Administered 2021-07-15: 2 ug/min via INTRAVENOUS
  Filled 2021-07-14 (×2): qty 250

## 2021-07-14 MED ORDER — LIVING WELL WITH DIABETES BOOK
Freq: Once | Status: AC
  Filled 2021-07-14: qty 1

## 2021-07-14 MED ORDER — SODIUM CHLORIDE 0.9 % IV SOLN
1.0000 g | INTRAVENOUS | Status: DC
  Filled 2021-07-14: qty 10

## 2021-07-14 MED ORDER — METOPROLOL TARTRATE 5 MG/5ML IV SOLN
2.5000 mg | INTRAVENOUS | Status: DC | PRN
  Administered 2021-07-14 – 2021-07-15 (×2): 2.5 mg via INTRAVENOUS
  Filled 2021-07-14 (×2): qty 5

## 2021-07-14 MED ORDER — MUPIROCIN 2 % EX OINT
1.0000 "application " | TOPICAL_OINTMENT | Freq: Two times a day (BID) | CUTANEOUS | Status: AC
  Administered 2021-07-14 – 2021-07-18 (×10): 1 via NASAL
  Filled 2021-07-14: qty 22

## 2021-07-14 NOTE — Progress Notes (Signed)
Pt was evaluated at bedside for diagnostic thoracentesis. Upon US examination, there was trace fluid on L and a very small fluid pocket on R not large enough to safely access.  Exam was ended and explained to patient.  Patient was in agreement with findings. Ordering provider was notified.   Narda Rutherford, AGNP-BC 07/14/2021, 12:01 PM

## 2021-07-14 NOTE — Progress Notes (Signed)
Pt transported to ICU 5 on the Bipap without incident. Pt remains on Bipap and is tol well at this time. Report given to ICU RT.

## 2021-07-14 NOTE — Consult Note (Signed)
ANTICOAGULATION CONSULT NOTE - Initial Consult  Pharmacy Consult for heparin infusion Indication: pulmonary embolus  No Known Allergies  Patient Measurements: Height: 5\' 2"  (157.5 cm) Weight: 93 kg (205 lb 0.4 oz) IBW/kg (Calculated) : 50.1 Heparin Dosing Weight: 71.6 kg  Vital Signs: Temp: 98.6 F (37 C) (01/05 2000) Temp Source: Oral (01/05 1200) BP: 99/83 (01/05 2000) Pulse Rate: 131 (01/05 2000)  Labs: Recent Labs    07/13/21 1706 07/13/21 1925 07/14/21 0253 07/14/21 0506 07/14/21 1136 07/14/21 1928  HGB 15.4*  --   --   --   --   --   HCT 48.1*  --   --   --   --   --   PLT 218  --   --   --   --   --   APTT  --   --   --  30  --   --   LABPROT  --   --   --  17.2*  --   --   INR  --   --   --  1.4*  --   --   HEPARINUNFRC  --   --   --   --  0.78* >1.10*  CREATININE 1.76*  --  1.80*  --   --   --   TROPONINIHS 25* 25*  --   --   --   --      Estimated Creatinine Clearance: 33.1 mL/min (A) (by C-G formula based on SCr of 1.8 mg/dL (H)).   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   CHF (congestive heart failure) ([place])    Coronary artery disease 2009   DCIS (ductal carcinoma in situ) of breast, right 2012   Diabetes mellitus (HCC)    Type II   GERD (gastroesophageal reflux disease)    Hyperlipidemia    Hypertension    Ischemic cardiomyopathy    Obesity, unspecified    Personal history of radiation therapy    Sleep apnea    Thyroid disease    hypothyroidism   Umbilical hernia without mention of obstruction or gangrene     Medications:  No prior AC noted  Heparin Dosing Weight: 71.6 kg  Assessment: 66 y.o female with significant PMH of OSA on CPAP, hypothyroidism, diabetes mellitus, hypertension,  CHF, DCIS, neutrophilia, hyperlipidemia, CAD, who presented to the ED with chief complaints of  shortness of breath x1 week with worsening respiratory status. CT concerning for possible PE.  1/5 1928 HL > 1.1    Goal of Therapy:  Heparin level 0.3-0.7 units/ml Monitor platelets by anticoagulation protocol: Yes   Plan:  Heparin level is supratherapeutic. Will hold heparin infusion for 1 hours and decrease heparin rate to 1000 units/hr. Recheck heparin level in 6 hours. CBC daily while on heparin.   Eleonore Chiquito Clinical Pharmacist 07/14/2021,8:30 PM

## 2021-07-14 NOTE — Consult Note (Addendum)
PHARMACY CONSULT NOTE - FOLLOW UP  Pharmacy Consult for Electrolyte Monitoring and Replacement   Recent Labs: Potassium (mmol/L)  Date Value  07/14/2021 4.0   Magnesium (mg/dL)  Date Value  07/14/2021 2.2   Calcium (mg/dL)  Date Value  07/14/2021 7.9 (L)   Albumin (g/dL)  Date Value  06/22/2021 4.6   Phosphorus (mg/dL)  Date Value  07/14/2021 3.6   Sodium (mmol/L)  Date Value  07/14/2021 136  06/22/2021 138     Assessment: 66 y.o female with significant PMH of OSA on CPAP, hypothyroidism, DM, HTN, CHF, DCIS, neutrophilia, HLD, CAD, who presented to the ED with chief complaints of  SOB x1 week, with worsening respiratory status. CT concerning for possible PE. Pharmacy consulted for mgmt of electrolytes and renal adjustment monitoring.  Na: 134>136 Scr: 1.76>1.8 CO2: 19>17   Goal of Therapy:  Lytes WNL  Plan:  Lytes trending appropriately and WNL this AM. Will CTM and replete as needed PRN. No repletion at this time. BMP, Mg, Phos ordered with AM labs.  Lorna Dibble, PharmD, [facility] Clinical Pharmacist 07/14/2021 7:28 AM

## 2021-07-14 NOTE — Progress Notes (Signed)
Inpatient Diabetes Program Recommendations  AACE/ADA: New Consensus Statement on Inpatient Glycemic Control (2015)  Target Ranges:  Prepandial:   less than 140 mg/dL      Peak postprandial:   less than 180 mg/dL (1-2 hours)      Critically ill patients:  140 - 180 mg/dL   Lab Results  Component Value Date   GLUCAP 182 (H) 07/14/2021   HGBA1C 10.0 (A) 06/22/2021    Review of Glycemic Control  Diabetes history: DM2 Outpatient Diabetes medications: Lantus 50 units BID Current orders for Inpatient glycemic control: Novolog 0-20 units Q4H, Solumedrol 40 mg Q12H  Spoke with patient at bedside.  Reviewed patient's current A1c of 10% (average blood sugar of 240 mg/dL).  Explained what a A1c is and what it measures. Also reviewed goal A1c with patient, importance of good glucose control @ home, and blood sugar goals.  Confirms above home DM medications.  Her PCP discontinued her Amaryl recently after having an episode of hypoglycemia in the 50's.  Denies any other episodes of hypoglycemia.  She does not eat regularly and she orders take out most of the time.  She does not drink any beverages with sugar.  Educated her on The Plate Method, CHOs, long and short term complications of uncontrolled blood sugar and importance of rotating sites for injections.  Ordered LWWD booklet.    Will continue to follow while inpatient.  Thank you, Reche Dixon, MSN, RN Diabetes Coordinator Inpatient Diabetes Program [phone] (team pager from 8a-5p)

## 2021-07-14 NOTE — Consult Note (Signed)
ANTICOAGULATION CONSULT NOTE - Initial Consult  Pharmacy Consult for heparin infusion Indication: pulmonary embolus  No Known Allergies  Patient Measurements: Height: 5\' 2"  (157.5 cm) Weight: 93 kg (205 lb 0.4 oz) IBW/kg (Calculated) : 50.1 Heparin Dosing Weight: 71.6 kg  Vital Signs: Temp: 98.2 F (36.8 C) (01/05 1200) Temp Source: Oral (01/05 1200) BP: 93/50 (01/05 1200) Pulse Rate: 128 (01/05 1200)  Labs: Recent Labs    07/13/21 1706 07/13/21 1925 07/14/21 0253 07/14/21 0506 07/14/21 1136  HGB 15.4*  --   --   --   --   HCT 48.1*  --   --   --   --   PLT 218  --   --   --   --   APTT  --   --   --  30  --   LABPROT  --   --   --  17.2*  --   INR  --   --   --  1.4*  --   HEPARINUNFRC  --   --   --   --  0.78*  CREATININE 1.76*  --  1.80*  --   --   TROPONINIHS 25* 25*  --   --   --      Estimated Creatinine Clearance: 33.1 mL/min (A) (by C-G formula based on SCr of 1.8 mg/dL (H)).   Medical History: Past Medical History:  Diagnosis Date   Abscess of breast, right 08/19/2012   Breast cancer ([place]) 07/13/2010   1.5 cm,intermediate grade DCIS, nuclear grade 2, ER 90%, PR 90% treated with wide excision, reexcision to negative margins and MammoSite partial breast radiation.   CHF (congestive heart failure) ([place])    Coronary artery disease 2009   DCIS (ductal carcinoma in situ) of breast, right 2012   Diabetes mellitus (HCC)    Type II   GERD (gastroesophageal reflux disease)    Hyperlipidemia    Hypertension    Ischemic cardiomyopathy    Obesity, unspecified    Personal history of radiation therapy    Sleep apnea    Thyroid disease    hypothyroidism   Umbilical hernia without mention of obstruction or gangrene     Medications:  No prior AC noted  Heparin Dosing Weight: 71.6 kg  Assessment: 66 y.o female with significant PMH of OSA on CPAP, hypothyroidism, diabetes mellitus, hypertension, CHF, DCIS, neutrophilia, hyperlipidemia, CAD, who presented to  the ED with chief complaints of  shortness of breath x1 week with worsening respiratory status. CT concerning for possible PE.   Goal of Therapy:  Heparin level 0.3-0.7 units/ml Monitor platelets by anticoagulation protocol: Yes   Plan:  HL slightly supratherapeutic @0 .78 (1/5 1136). Decrease heparin infusion rate to 1150 units/hr Check anti-Xa level in 6 hours  Continue to monitor H&H and platelets  Lorna Dibble, PharmD, [facility] Clinical Pharmacist 07/14/2021,12:31 PM

## 2021-07-14 NOTE — Plan of Care (Signed)
°  Problem: Education: Goal: Knowledge of General Education information will improve Description: Including pain rating scale, medication(s)/side effects and non-pharmacologic comfort measures Outcome: Progressing   Problem: Health Behavior/Discharge Planning: Goal: Ability to manage health-related needs will improve Outcome: Progressing   Problem: Clinical Measurements: Goal: Will remain free from infection Outcome: Progressing Goal: Respiratory complications will improve Outcome: Progressing Goal: Cardiovascular complication will be avoided Outcome: Progressing   Problem: Coping: Goal: Level of anxiety will decrease Outcome: Progressing   Problem: Elimination: Goal: Will not experience complications related to bowel motility Outcome: Progressing Goal: Will not experience complications related to urinary retention Outcome: Progressing   Problem: Pain Managment: Goal: General experience of comfort will improve Outcome: Progressing   Problem: Safety: Goal: Ability to remain free from injury will improve Outcome: Progressing   Problem: Skin Integrity: Goal: Risk for impaired skin integrity will decrease Outcome: Progressing

## 2021-07-14 NOTE — Progress Notes (Signed)
An USGPIV (ultrasound guided PIV) has been placed for short-term vasopressor infusion. A correctly placed ivWatch must be used when administering Vasopressors. Should this treatment be needed beyond 72 hours, central line access should be obtained.  It will be the responsibility of the bedside nurse to follow best practice to prevent extravasations.   ?

## 2021-07-14 NOTE — Progress Notes (Deleted)
Attending Note Patient seen and examined, agree with detailed note above,   Patient presentation and plan discussed on rounds.   Cardiology consult placed by Dr.  Lanney Gins Reason for consult: Atrial fibrillation, cardiomyopathy  EKG lab work, chest x-ray, echocardiogram reviewed independently by myself  Michele [name] is a 66 year old woman with history of poorly controlled diabetes, Remote history of coronary disease by catheterization 2009, positive follow-up, Presenting with acute hypoxic respiratory failure, noted to be in atrial fibrillation with RVR  She reports progressive shortness of breath at home, unable to see primary care for further evaluation.  Once able to get into their office, EKG confirming atrial fibrillation with RVR and was sent to the emergency room.  EKG confirming A. fib with rapid rate 148 bpm, interventricular conduction delay, unable to exclude old anterior MI  Chest x-ray with pulmonary edema CTA chest, unable to exclude PE  Echocardiogram with severely depressed ejection fraction estimated <20% Moderately reduced RV function  Started on pressors for blood pressure support by ICU team, levo, neo- Required BiPAP for respiratory distress  On examination : Obese.  alert oriented,  JVD 10+, lungs scattered Rales, heart sounds irregular, rapid no murmurs appreciated, abdomen soft nontender no significant lower extremity edema.  Musculoskeletal exam with good range of motion, neurologic exam grossly nonfocal  Lab work reviewed sodium 136 potassium 4.0 creatinine 1.8 BUN 42 BNP 740 troponin 25  EKG personally reviewed by myself showing Atrial fibrillation rate 153 bpm consider old anterior MI, interventricular conduction delay  A/P: Cardiogenic shock In the setting of atrial fibrillation with RVR, cardiomyopathy presumed to be ischemic Echocardiogram confirming ejection fraction less than 20% --Would hold midodrine, restart nor epi --Dose of Lasix once blood  pressure improves, likely with cardiorenal syndrome ----Very challenging case, would likely benefit from right and left heart catheterization. High risk of underlying coronary disease given long history of poorly controlled diabetes, previously noted coronary disease in 2009 by catheterization --Aspirin, heparin infusion Tentatively plan for cardiac catheterization tomorrow morning 10 AM with Dr. Fletcher Anon. I have reviewed the risks, indications, and alternatives to cardiac catheterization, possible angioplasty, and stenting with the patient. Risks include but are not limited to bleeding, infection, vascular injury, stroke, myocardial infection, arrhythmia, kidney injury, radiation-related injury in the case of prolonged fluoroscopy use, emergency cardiac surgery, and death. The patient understands the risks of serious complication is 1-2 in 2500 with diagnostic cardiac cath and 1-2% or less with angioplasty/stenting.  -- No room on blood pressure for beta-blockers at this time  Cardiomyopathy Consider holding midodrine, reinitiation of nor epi Catheterization as detailed above  Atrial fibrillation with RVR Currently on low-dose metoprolol, rate low 100 If needed for rate control may need to add amiodarone infusion She has not been on anticoagulation until now, currently on heparin infusion Low threshold to add amiodarone for rapid rate  Poorly controlled diabetes with complications Long history of A1c 10-14 Also with associated renal failure, creatinine of 2, CK D3 High risk of complications as detailed above  Acute on chronic kidney disease Baseline unclear, unable to exclude component of cardiorenal syndrome Lasix x1  Greater than 50% was spent in counseling and coordination of care with patient Total encounter time 110 minutes or more   Signed: Esmond Plants  M.D., Ph.D. [facility]

## 2021-07-14 NOTE — Progress Notes (Signed)
*  PRELIMINARY RESULTS* Echocardiogram 2D Echocardiogram has been performed.  Michele [name] 07/14/2021, 10:40 AM

## 2021-07-14 NOTE — Consult Note (Addendum)
ANTICOAGULATION CONSULT NOTE - Initial Consult  Pharmacy Consult for heparin infusion Indication: pulmonary embolus  No Known Allergies  Patient Measurements: Height: 5\' 2"  (157.5 cm) Weight: 93 kg (205 lb 0.4 oz) IBW/kg (Calculated) : 50.1 Heparin Dosing Weight: 71.6 lg  Vital Signs: Temp: 97.9 F (36.6 C) (01/05 0344) Temp Source: Axillary (01/05 0344) BP: 108/72 (01/05 0300) Pulse Rate: 131 (01/05 0344)  Labs: Recent Labs    07/13/21 1706 07/13/21 1925 07/14/21 0253  HGB 15.4*  --   --   HCT 48.1*  --   --   PLT 218  --   --   CREATININE 1.76*  --  1.80*  TROPONINIHS 25* 25*  --     Estimated Creatinine Clearance: 33.1 mL/min (A) (by C-G formula based on SCr of 1.8 mg/dL (H)).   Medical History: Past Medical History:  Diagnosis Date   Abscess of breast, right 08/19/2012   Breast cancer ([place]) 07/13/2010   1.5 cm,intermediate grade DCIS, nuclear grade 2, ER 90%, PR 90% treated with wide excision, reexcision to negative margins and MammoSite partial breast radiation.   CHF (congestive heart failure) ([place])    Coronary artery disease 2009   DCIS (ductal carcinoma in situ) of breast, right 2012   Diabetes mellitus (H[facility])    Type II   GERD (gastroesophageal reflux disease)    Hyperlipidemia    Hypertension    Ischemic cardiomyopathy    Obesity, unspecified    Personal history of radiation therapy    Sleep apnea    Thyroid disease    hypothyroidism   Umbilical hernia without mention of obstruction or gangrene     Medications:  No prior AC noted   Assessment: 66 y.o female with significant PMH of OSA on CPAP, hypothyroidism, diabetes mellitus, hypertension, CHF, DCIS, neutrophilia, hyperlipidemia, CAD, who presented to the ED with chief complaints of  shortness of breath x1 week with worsening respiratory status. CT concerning for possible PE.   Goal of Therapy:  Heparin level 0.3-0.7 units/ml Monitor platelets by anticoagulation protocol: Yes   Plan:   Give 5000 units bolus x 1 Start heparin infusion at 1200 units/hr Check anti-Xa level in 6 hours  Continue to monitor H&H and platelets  Dorothe Pea, PharmD, BCPS Clinical Pharmacist   07/14/2021,4:28 AM

## 2021-07-14 NOTE — H&P (Signed)
NAME:  Michele [name], MRN:  [id number], DOB:  05/06/1956, LOS: 1 ADMISSION DATE:  07/13/2021, CONSULTATION DATE:  07/13/2021 REFERRING MD:  Nance Pear, MD  CHIEF COMPLAINT:  SOB    HPI  66 y.o female with significant PMH of OSA on CPAP, hypothyroidism, diabetes mellitus, hypertension, CHF, DCIS, neutrophilia, hyperlipidemia, CAD, who presented to the ED with chief complaints of  shortness of breath x1 week  ED Course: On arrival to the ED, she was afebrile with initial blood pressure 100/57 mm Hg and pulse rate146  beats/min, RR 22 sats 100% on RA.Marland Kitchen There were no focal neurological deficits; patient was alert oriented x4.  Chest x-ray was obtained showed cardiac enlargement with mild vascular congestion and mild left lower lobe atelectasis or infiltrate.  CTA angio chest showed incomplete opacification of segmental branches of the right lower lobe pulmonary artery, PE could not be excluded.  Cardiomegaly with scattered groundglass opacities and small bilateral effusion consistent with mild congestive heart failure. Pertinent Labs in Red/Diagnostics Findings: Na+/ K+: 134/3.9 Glucose: 298 BUN/Cr.: 43/1.76 Calcium: 8.4   WBC: 10.0 Hgb/Hct: 15.4/48.1 PCT: negative <0.10 Lactic acid: 3.9>4.2 COVID PCR: Negative   Troponin: 25 BNP: 738 D.Dimer: 2.54 TSH: 8.127 Given finding as above, patient is started on broad-spectrum antibiotics per sepsis protocol.  Patient remained hypotensive requiring vasopressors.  Also placed on BiPAP due to increased oxygen requirement.  PCCM consulted for further management. Past Medical History  OSA on CPAP, hypothyroidism, diabetes mellitus, hypertension, CHF, DCIS, neutrophilia, hyperlipidemia, CAD,  Significant Hospital Events   1/4: Admitted to ICU with acute hypoxic respiratory failure  Consults:  Vascular  Procedures:  None  Significant Diagnostic Tests:  1/4: Chest Xray>Cardiac enlargement with mild vascular congestion Mild left lower lobe  atelectasis or infiltrate 1/4: CTA Chest>1. Incomplete opacification of segmental branches of the right lower lobe pulmonary artery. Favor flow related phenomenon and mixing artifact over nonocclusive thrombus. However, PE cannot be excluded. 2. Cardiomegaly, with scattered ground-glass opacities and small bilateral effusions consistent with mild congestive heart failure.  Micro Data:  1/4: SARS-CoV-2 PCR> negative 1/4: Influenza PCR> negative 1/5: Blood culture x2> 1/5: Urine Culture> 1/4: MRSA PCR>>  1/4: Strep pneumo urinary antigen> 1/4: Legionella urinary antigen>  Antimicrobials:  Azithromycin 1/4> Ceftriaxone 1/4>  OBJECTIVE  Blood pressure 90/74, pulse (!) 126, temperature 97.6 F (36.4 C), temperature source Oral, resp. rate 16, height _0  (1.575 m), weight 93 kg, SpO2 94 %.        Intake/Output Summary (Last 24 hours) at 07/14/2021 0340 Last data filed at 07/14/2021 0200 Gross per 24 hour  Intake 375.04 ml  Output --  Net 375.04 ml   Filed Weights   07/13/21 1707 07/14/21 0105  Weight: 91.6 kg 93 kg     Physical Examination  GENERAL: 66 year-old critically ill patient lying in the bed with no acute distress on BiPAP EYES: Pupils equal, round, reactive to light and accommodation. No scleral icterus. Extraocular muscles intact.  HEENT: Head atraumatic, normocephalic. Oropharynx and nasopharynx clear.  NECK:  Supple, no jugular venous distention. No thyroid enlargement, no tenderness.  LUNGS: Normal breath sounds bilaterally, no wheezing, rales,rhonchi or crepitation. No use of accessory muscles of respiration.  CARDIOVASCULAR: S1, S2 normal. No murmurs, rubs, or gallops.  ABDOMEN: Soft, nontender, nondistended. Bowel sounds present. No organomegaly or mass.  EXTREMITIES: No pedal edema, cyanosis, or clubbing.  NEUROLOGIC: Cranial nerves II through XII are intact.  Muscle strength 5/5 in all extremities. Sensation intact. Gait not checked.  PSYCHIATRIC: The  patient is on BIPAP SKIN: No obvious rash, lesion, or ulcer.   Labs/imaging that I havepersonally reviewed  (right click and "Reselect all SmartList Selections" daily)     Labs   CBC: Recent Labs  Lab 07/13/21 1706  WBC 10.0  HGB 15.4*  HCT 48.1*  MCV 100.4*  PLT 387    Basic Metabolic Panel: Recent Labs  Lab 07/13/21 1706 07/14/21 0253  NA 134* 136  K 3.9 4.0  CL 102 106  CO2 19* 17*  GLUCOSE 298* 202*  BUN 43* 42*  CREATININE 1.76* 1.80*  CALCIUM 8.4* 7.9*  MG  --  2.2  PHOS  --  3.6   GFR: Estimated Creatinine Clearance: 33.1 mL/min (A) (by C-G formula based on SCr of 1.8 mg/dL (H)). Recent Labs  Lab 07/13/21 1706 07/13/21 1925 07/14/21 0003 07/14/21 0253  PROCALCITON  --  <0.10  --   --   WBC 10.0  --   --   --   LATICACIDVEN  --   --  3.9* 4.2*    Liver Function Tests: No results for input(s): AST, ALT, ALKPHOS, BILITOT, PROT, ALBUMIN in the last 168 hours. No results for input(s): LIPASE, AMYLASE in the last 168 hours. No results for input(s): AMMONIA in the last 168 hours.  ABG No results found for: PHART, PCO2ART, PO2ART, HCO3, TCO2, ACIDBASEDEF, O2SAT   Coagulation Profile: No results for input(s): INR, PROTIME in the last 168 hours.  Cardiac Enzymes: No results for input(s): CKTOTAL, CKMB, CKMBINDEX, TROPONINI in the last 168 hours.  HbA1C: Hemoglobin A1C  Date/Time Value Ref Range Status  06/22/2021 02:08 PM 10.0 (A) 4.0 - 5.6 % Final  07/30/2018 09:13 AM 9.9 (A) 4.0 - 5.6 % Final   Hgb A1c MFr Bld  Date/Time Value Ref Range Status  11/19/2019 11:43 AM 9.8 (H) 4.8 - 5.6 % Final    Comment:             Prediabetes: 5.7 - 6.4          Diabetes: >6.4          Glycemic control for adults with diabetes: <7.0   03/18/2018 03:53 PM 9.3 (H) 4.8 - 5.6 % Final    Comment:             Prediabetes: 5.7 - 6.4          Diabetes: >6.4          Glycemic control for adults with diabetes: <7.0     CBG: Recent Labs  Lab 07/14/21 0056   GLUCAP 220*    Review of Systems:   Unable to obtain patient is on BiPAP  Past Medical History  She,  has a past medical history of Abscess of breast, right (08/19/2012), Breast cancer ([place]) (07/13/2010), CHF (congestive heart failure) ([place]), Coronary artery disease (2009), DCIS (ductal carcinoma in situ) of breast, right (2012), Diabetes mellitus ([place]), GERD (gastroesophageal reflux disease), Hyperlipidemia, Hypertension, Ischemic cardiomyopathy, Obesity, unspecified, Personal history of radiation therapy, Sleep apnea, Thyroid disease, and Umbilical hernia without mention of obstruction or gangrene.   Surgical History    Past Surgical History:  Procedure Laterality Date   BREAST BIOPSY Right 2007   right bx neg   BREAST BIOPSY Right 08/10/2016   fat necrosis/ done in Dr. Dwyane Luo office   BREAST EXCISIONAL BIOPSY Right 2011   DCIS mammosite lumpectomy   BREAST LUMPECTOMY Right Jan 2012   Wide excision   BREAST MASS EXCISION Right 2011  December   CARDIAC CATHETERIZATION  01/08/2008   CHOLECYSTECTOMY  2013   COLONOSCOPY  2011   Dr. Candace Cruise- [place]  2013   epigastric   INCISION AND DRAINAGE BREAST ABSCESS Right 08/19/2012   TONSILLECTOMY AND ADENOIDECTOMY     age 19 yrs     Social History   reports that she has never smoked. She has never used smokeless tobacco. She reports that she does not drink alcohol and does not use drugs.   Family History   Her family history includes Arthritis in her father; CVA in her mother; Congestive Heart Failure in her mother; Diabetes in her mother and sister; Heart attack in her father, maternal grandfather, and paternal grandmother; Heart failure in her mother; Hemochromatosis in her brother; Hypertension in her brother, brother, and sister; Lung cancer in her sister; Stroke in her maternal grandmother. There is no history of Breast cancer.   Allergies No Known Allergies   Home Medications  Prior to Admission medications    Medication Sig Start Date End Date Taking? Authorizing Provider  allopurinol (ZYLOPRIM) 300 MG tablet Take 1 tablet (300 mg total) by mouth daily. 11/19/19   Jerrol Banana., MD  amLODipine (NORVASC) 10 MG tablet TAKE 1 TABLET BY MOUTH  DAILY 04/16/19   Jerrol Banana., MD  Blood Glucose Monitoring Suppl (ONE TOUCH ULTRA SYSTEM KIT) w/Device KIT Check sugar once daily. DX E11.9 10/18/16   Jerrol Banana., MD  BYSTOLIC 20 MG TABS TAKE 1 TABLET BY MOUTH  DAILY 05/19/20   Jerrol Banana., MD  cetirizine (ZYRTEC) 10 MG tablet Take 1 tablet (10 mg total) by mouth daily. 03/18/18   Jerrol Banana., MD  fluticasone [facility]) 50 MCG/ACT nasal spray Place into the nose. 02/24/13   [provider]  glimepiride (AMARYL) 4 MG tablet TAKE 1 TABLET BY MOUTH  TWICE DAILY 02/25/20   Jerrol Banana., MD  Glucagon (GVOKE HYPOPEN 1-PACK) 1 MG/0.2ML SOAJ Inject 1 mg into the skin every 15 (fifteen) minutes as needed for up to 5 doses. 06/22/21   Myles Gip, DO  glucose blood (ONE TOUCH ULTRA TEST) test strip Use as instructed 12/10/17   Jerrol Banana., MD  insulin glargine (LANTUS SOLOSTAR) 100 UNIT/ML Solostar Pen INJECT 50 UNITS UNDER THE SKIN TWICE DAILY 12/31/19   Jerrol Banana., MD  Insulin Pen Needle 32G X 4 MM MISC Inject twice daily, SQ. DX E11.9 11/19/19   Jerrol Banana., MD  Lancets [facility] ULTRASOFT) lancets Check sugar once daily DX E11.9 12/10/17   Jerrol Banana., MD  levothyroxine (SYNTHROID) 200 MCG tablet TAKE 1 TABLET(200 MCG) BY MOUTH DAILY BEFORE BREAKFAST 06/22/21   Myles Gip, DO  losartan (COZAAR) 100 MG tablet TAKE 1 TABLET BY MOUTH  DAILY 02/06/20   Jerrol Banana., MD  NON FORMULARY CPAP AT BEDTIME    [provider]  rosuvastatin (CRESTOR) 10 MG tablet TAKE 1 TABLET BY MOUTH  DAILY 11/28/19   Minna Merritts, MD  Syringe/Needle, Disp, (SYRINGE 3CC/25GX1") 25G X 1" 3 ML MISC 1 mL by Does not  apply route every 30 (thirty) days. 05/03/16   Jerrol Banana., MD  Scheduled Meds:  budesonide (PULMICORT) nebulizer solution  0.25 mg Nebulization BID   Chlorhexidine Gluconate Cloth  6 each Topical Daily   heparin  5,000 Units Intravenous Once   methylPREDNISolone (SOLU-MEDROL) injection  40  mg Intravenous Q12H   mupirocin ointment  1 application Nasal BID   Continuous Infusions:  sodium chloride Stopped (07/14/21 0344)   sodium chloride     azithromycin     cefTRIAXone (ROCEPHIN)  IV     heparin     norepinephrine (LEVOPHED) Adult infusion Stopped (07/14/21 0141)   phenylephrine (NEO-SYNEPHRINE) Adult infusion 60 mcg/min (07/14/21 0140)   PRN Meds:.docusate sodium, polyethylene glycol    Assessment & Plan:  Acute Hypoxic Respiratory Failure secondary to Pulmonary Edema, Underlying Pneumonia and Probable Pulmonary Embolism PMHx: OSA on CPAP -Supplemental O2 as needed to maintain O2 saturations 88 to 92% -BiPAP, wean as tolerated -High risk for intubation -Given elevated d -dimer, high risk score for PE and ? PE on CT will start on Heparin gtt -Will obtain US Bilateral eval for DVT -Follow intermittent ABG and chest x-ray as needed -As needed bronchodilators           -rvp   CICRUCLATORY SHOCK       SUSPECT  CHF decompnesation with cardiogenic shock due to elevated BNP and absence of evidence for infection  Sepsis due to suspected Pneumonia-RULED OUT         THERE IS ABSENCE OF LEUKOCYSTOSIS AND PROCAL IS NEGATIVE -Supplemental oxygen as needed, to maintain SpO2 > 90% -F/u cultures, trend lactic/ PCT -Monitor WBC/ fever curve -Continue abx with ceftriaxone and Azithromycin pending cultures -Repeat blood cultures until clear -Pressors for MAP goal >65 -Strict I/O's    Bilateral pleural effusions    Right is moderate - possible empyema due to findings consisntent with pna  New onset AFib+RVR Mild Elevated Troponin likely demand ischemia in the setting of  Afib/?PE -Serial EKGs -Trend Troponins -TSH, FT4 -TTEcho -Low dose metoprolol for rate control -continue Heparin as above -Cardiology Consult  Acute Onset CHF Hypertension Hx: CAD, HLD  -Continuous cardiac monitoring -Maintain MAP greater than 65 -Hold BP meds in the setting of hypotension -IV Lasix as blood pressure and renal function permits; currently on Lasix 20 mg IV BID -Repeat 2D Echocardiogram -Cardiology Consult   Acute Kidney Injury -Monitor I&O's / urinary output -Follow BMP -Ensure adequate renal perfusion -Avoid nephrotoxic agents as able -Replace electrolytes as indicated   Diabetes mellitus -CBGs -Sliding scale insulin -Follow ICU hyper/hypoglycemia protocol -Hold home Amaryl   Hypothyroidism Check TSH, Free T4 -On synthroid  Best practice:  Diet:  NPO Pain/Anxiety/Delirium protocol (if indicated): No VAP protocol (if indicated): Not indicated DVT prophylaxis: Systemic AC GI prophylaxis: N/A Glucose control:  SSI Yes Central venous access:  N/A Arterial line:  N/A Foley:  Yes, and it is still needed Mobility:  bed rest  PT consulted: N/A Last date of multidisciplinary goals of care discussion [1/4] Code Status:  full code Disposition: ICU   = Goals of Care = Code Status Order:  FULL  Primary Emergency Contact: Richardson,Robey Wishes to pursue full aggressive treatment and intervention options, including CPR and intubation, but goals of care will be addressed on going with family if that should become necessary.   Critical care provider statement:   Total critical care time: 33 minutes   Performed by: Lanney Gins MD   Critical care time was exclusive of separately billable procedures and treating other patients.   Critical care was necessary to treat or prevent imminent or life-threatening deterioration.   Critical care was time spent personally by me on the following activities: development of treatment plan with patient and/or  surrogate as well as nursing, discussions with consultants,  evaluation of patient's response to treatment, examination of patient, obtaining history from patient or surrogate, ordering and performing treatments and interventions, ordering and review of laboratory studies, ordering and review of radiographic studies, pulse oximetry and re-evaluation of patient's condition.    Ottie Glazier, M.D.  Pulmonary & Critical Care Medicine

## 2021-07-14 NOTE — Consult Note (Signed)
Cardiology Consultation:   Patient ID: Michele [name]; [id number]; [birth year]   Admit date: 07/13/2021 Date of Consult: 07/14/2021  Primary Care Provider: Jerrol Banana., MD Primary Cardiologist: Rockey Situ Primary Electrophysiologist:  None   Patient Profile:   Michele [name] is a 66 y.o. female with a hx of CAD by Ewa Gentry in 2009, HFrEF secondary to NICM, DM2, HLD, and OSA who has been lost to follow up and is being seen today for the evaluation of acute on chronic HFrEF and new onset Afib with RVR at the request of Dr. Lanney Gins.  History of Present Illness:   [name] underwent remote echo in 12/2007 that showed an EF of 30%. LHC at that time showed nonobstructive CAD including 30% proximal LAD stenosis followed to a 30% lesion. Repeat echo in 2013 showed an improvement in her LVSF with an EF of 50-55%, mild concentric LVH, no RWMA, and a mildly dilated left atrium. She was lost to follow up until reestablishing care in 2019.  More recently, she began to develop progressive weakness and SOB. She seen by her PCP on 07/13/2021 and noted to be in Afib with RVR of uncertain chronicity. She was directed to the ED.   She was admitted to [facility] on 1/4 with acute hypoxic respiratory failure felt to be multifactorial including low output acute on chronic HFrEF, Afib with RVR, and possible PE. Initial high sensitivity troponin of 25 with a delta troponin of 25. BNP 738. EKG showed Afib with RVR, 148 bpm, nonspecific IVCD, nonspecific st/t changes. Covid and influenza negative. D-dimer elevated at 2.54. PCT < 0.10. SCr 1.76 trending to 1.8. CXR showed cardiac enlargement with mild vascular congestion and mild left lower lobe atelectasis or infiltrate. CTA chest showed incomplete opacification of the segmental branches of the right lower lobe pulmonary artery favored to be flow related and mixing artifact over nonocclusive thrombus. PE could not be excluded. Cardiomegaly with scattered ground-glass  opacities and small bilateral pleural effusions consistent with mild congestive heart failure. Lower extremity ultrasound was negative for DVT bilaterally. Chest ultrasound did not reveal enough fluid to allow for thoracentesis. She has received IV azithromycin, Rocephin, steroids, inhalers, and was placed on a heparin gtt due to inability to exclude PE. She has required Levophed (currently held) and neo-synephrine with BP remaining in the 50Y systolic. She required BiPAP overnight, and is currently on room air. Echo 07/14/2021 showed an EF of < 20%, global hypokinesis, with inferior and inferolateral wall motion best preserved, moderately dilated LV internal cavity size, moderately reduced RVSF with normal ventricular cavity size, normal PASP, severely dilated left atrium, mildly dilated right atrium, small pericardial effusion, mild to moderate mitral regurgitation, and an estimated right atrial pressure of 3 mmHg. At this time, she remains in Afib with ventricular rates largely in the 1-teens bpm and is without chest pain.     Past Medical History:  Diagnosis Date   Abscess of breast, right 08/19/2012   Breast cancer ([place]) 07/13/2010   1.5 cm,intermediate grade DCIS, nuclear grade 2, ER 90%, PR 90% treated with wide excision, reexcision to negative margins and MammoSite partial breast radiation.   CHF (congestive heart failure) ([place])    Coronary artery disease 2009   DCIS (ductal carcinoma in situ) of breast, right 2012   Diabetes mellitus (HCC)    Type II   GERD (gastroesophageal reflux disease)    Hyperlipidemia    Hypertension    Ischemic cardiomyopathy    Obesity, unspecified  Personal history of radiation therapy    Sleep apnea    Thyroid disease    hypothyroidism   Umbilical hernia without mention of obstruction or gangrene     Past Surgical History:  Procedure Laterality Date   BREAST BIOPSY Right 2007   right bx neg   BREAST BIOPSY Right 08/10/2016   fat necrosis/ done in  Dr. Dwyane Luo office   BREAST EXCISIONAL BIOPSY Right 2011   DCIS mammosite lumpectomy   BREAST LUMPECTOMY Right Jan 2012   Wide excision   BREAST MASS EXCISION Right 2011   December   CARDIAC CATHETERIZATION  01/08/2008   CHOLECYSTECTOMY  2013   [facility]   HERNIA REPAIR  2013   epigastric   INCISION AND DRAINAGE BREAST ABSCESS Right 08/19/2012   TONSILLECTOMY AND ADENOIDECTOMY     age 23 yrs     Home Meds: Prior to Admission medications   Medication Sig Start Date End Date Taking? Authorizing Provider  allopurinol (ZYLOPRIM) 300 MG tablet Take 1 tablet (300 mg total) by mouth daily. 11/19/19   Jerrol Banana., MD  amLODipine (NORVASC) 10 MG tablet TAKE 1 TABLET BY MOUTH  DAILY 04/16/19   Jerrol Banana., MD  Blood Glucose Monitoring Suppl (ONE TOUCH ULTRA SYSTEM KIT) w/Device KIT Check sugar once daily. DX E11.9 10/18/16   Jerrol Banana., MD  BYSTOLIC 20 MG TABS TAKE 1 TABLET BY MOUTH  DAILY 05/19/20   Jerrol Banana., MD  cetirizine (ZYRTEC) 10 MG tablet Take 1 tablet (10 mg total) by mouth daily. 03/18/18   Jerrol Banana., MD  fluticasone [facility]) 50 MCG/ACT nasal spray Place into the nose. 02/24/13   [provider]  glimepiride (AMARYL) 4 MG tablet TAKE 1 TABLET BY MOUTH  TWICE DAILY 02/25/20   Jerrol Banana., MD  Glucagon (GVOKE HYPOPEN 1-PACK) 1 MG/0.2ML SOAJ Inject 1 mg into the skin every 15 (fifteen) minutes as needed for up to 5 doses. 06/22/21   Myles Gip, DO  glucose blood (ONE TOUCH ULTRA TEST) test strip Use as instructed 12/10/17   Jerrol Banana., MD  insulin glargine (LANTUS SOLOSTAR) 100 UNIT/ML Solostar Pen INJECT 50 UNITS UNDER THE SKIN TWICE DAILY 12/31/19   Jerrol Banana., MD  Insulin Pen Needle 32G X 4 MM MISC Inject twice daily, SQ. DX E11.9 11/19/19   Jerrol Banana., MD  Lancets [facility] ULTRASOFT) lancets Check sugar once daily DX E11.9 12/10/17   Jerrol Banana., MD  levothyroxine (SYNTHROID) 200 MCG tablet TAKE 1 TABLET(200 MCG) BY MOUTH DAILY BEFORE BREAKFAST 06/22/21   Myles Gip, DO  losartan (COZAAR) 100 MG tablet TAKE 1 TABLET BY MOUTH  DAILY 02/06/20   Jerrol Banana., MD  NON FORMULARY CPAP AT BEDTIME    [provider]  rosuvastatin (CRESTOR) 10 MG tablet TAKE 1 TABLET BY MOUTH  DAILY 11/28/19   Minna Merritts, MD  Syringe/Needle, Disp, (SYRINGE 3CC/25GX1") 25G X 1" 3 ML MISC 1 mL by Does not apply route every 30 (thirty) days. 05/03/16   Jerrol Banana., MD    Inpatient Medications: Scheduled Meds:  budesonide (PULMICORT) nebulizer solution  0.25 mg Nebulization BID   Chlorhexidine Gluconate Cloth  6 each Topical Daily   colchicine  0.6 mg Oral Daily   furosemide  40 mg Intravenous Once   insulin aspart  0-20 Units Subcutaneous  Q4H   levothyroxine  200 mcg Oral Q0600   living well with diabetes book   Does not apply Once   methylPREDNISolone (SOLU-MEDROL) injection  40 mg Intravenous Q12H   metoprolol tartrate  12.5 mg Oral BID   mupirocin ointment  1 application Nasal BID   Continuous Infusions:  sodium chloride Stopped (07/14/21 0344)   sodium chloride     heparin 1,150 Units/hr (07/14/21 1305)   norepinephrine (LEVOPHED) Adult infusion Stopped (07/14/21 0141)   phenylephrine (NEO-SYNEPHRINE) Adult infusion 50 mcg/min (07/14/21 1305)   PRN Meds: docusate sodium, metoprolol tartrate, polyethylene glycol  Allergies:  No Known Allergies  Social History:   Social History   Socioeconomic History   Marital status: Married    Spouse name: [name]   Number of children: 0   Years of education: Not on file   Highest education level: Not on file  Occupational History   Occupation: retired    Fish farm manager: [employer]  Tobacco Use   Smoking status: Never   Smokeless tobacco: Never  Substance and Sexual Activity   Alcohol use: No   Drug use: No   Sexual activity: Not on file  Other Topics Concern    Not on file  Social History Narrative   Never smoked; no alcohol; lives in [place]; self; husband passed in 2020. Retired from [employer] Strain: Not on file  Food Insecurity: Not on file  Transportation Needs: Not on file  Physical Activity: Not on file  Stress: Not on file  Social Connections: Not on file  Intimate Partner Violence: Not on file     Family History:   Family History  Problem Relation Age of Onset   Heart failure Mother    Diabetes Mother    Congestive Heart Failure Mother    CVA Mother    Heart attack Father    Arthritis Father    Diabetes Sister    Hypertension Sister    Lung cancer Sister    Hypertension Brother    Hypertension Brother    Hemochromatosis Brother    Stroke Maternal Grandmother    Heart attack Maternal Grandfather    Heart attack Paternal Grandmother    Breast cancer Neg Hx     ROS:  Review of Systems  Constitutional:  Positive for malaise/fatigue. Negative for chills, diaphoresis, fever and weight loss.  HENT:  Negative for congestion.   Eyes:  Negative for discharge and redness.  Respiratory:  Positive for shortness of breath. Negative for cough, sputum production and wheezing.   Cardiovascular:  Negative for chest pain, palpitations, orthopnea, claudication, leg swelling and PND.  Gastrointestinal:  Negative for abdominal pain, blood in stool, heartburn, melena, nausea and vomiting.  Musculoskeletal:  Negative for falls and myalgias.  Skin:  Negative for rash.  Neurological:  Positive for weakness. Negative for dizziness, tingling, tremors, sensory change, speech change, focal weakness and loss of consciousness.  Endo/Heme/Allergies:  Does not bruise/bleed easily.  Psychiatric/Behavioral:  Negative for substance abuse. The patient is not nervous/anxious.   All other systems reviewed and are negative.    Physical Exam/Data:   Vitals:   07/14/21 1200 07/14/21 1300  07/14/21 1400 07/14/21 1500  BP: (!) 93/50 95/80 108/85 107/89  Pulse: (!) 128 (!) 112 (!) 131 (!) 127  Resp: 17 14 (!) 23 (!) 23  Temp: 98.2 F (36.8 C)     TempSrc: Oral     SpO2: 100% 95% (!) 87%  95%  Weight:      Height:        Intake/Output Summary (Last 24 hours) at 07/14/2021 1550 Last data filed at 07/14/2021 1200 Gross per 24 hour  Intake 951.94 ml  Output --  Net 951.94 ml   Filed Weights   07/13/21 1707 07/14/21 0105  Weight: 91.6 kg 93 kg   Body mass index is 37.5 kg/m.   Physical Exam: General: Well developed, well nourished, in no acute distress. Head: Normocephalic, atraumatic, sclera non-icteric, no xanthomas, nares without discharge.  Neck: Negative for carotid bruits. JVD difficult to assess secondary to body habitus. Lungs: Diminished breath sounds along the bases bilaterally. Breathing is unlabored. Heart: Tachycardic, IRIR with S1 S2. II/VI systolic murmur LSB, no rubs, or gallops appreciated. Abdomen: Soft, non-tender, non-distended with normoactive bowel sounds. No hepatomegaly. No rebound/guarding. No obvious abdominal masses. Msk:  Strength and tone appear normal for age. Extremities: No clubbing or cyanosis. No edema. Distal pedal pulses are 2+ and equal bilaterally. Neuro: Alert and oriented X 3. No facial asymmetry. No focal deficit. Moves all extremities spontaneously. Psych:  Responds to questions appropriately with a normal affect.   EKG:  The EKG was personally reviewed and demonstrates: Afib with RVR, 148 bpm, nonspecific IVCD, nonspecific st/t changes Telemetry:  Telemetry was personally reviewed and demonstrates: Afib with RVR with ventricular rates in the 110s to 130s bpm  Weights: Filed Weights   07/13/21 1707 07/14/21 0105  Weight: 91.6 kg 93 kg    Relevant CV Studies:  2D echo 07/14/2021: 1. Left ventricular ejection fraction, by estimation, is <20%. The left  ventricle has severely decreased function. The left ventricle  demonstrates  global hypokinesis , inferior and inferolateral wall motion best  preserved. The left ventricular internal  cavity size was moderately dilated. Left ventricular diastolic parameters  are indeterminate.   2. Right ventricular systolic function is moderately reduced. The right  ventricular size is normal. There is normal pulmonary artery systolic  pressure.   3. Left atrial size was severely dilated.   4. Right atrial size was mildly dilated.   5. A small pericardial effusion is present.   6. The mitral valve is normal in structure. Mild to moderate mitral valve  regurgitation. No evidence of mitral stenosis.   7. The inferior vena cava is normal in size with greater than 50%  respiratory variability, suggesting right atrial pressure of 3 mmHg. __________  2D echo 01/2012: - Procedure narrative: Transthoracic echocardiography. Image    quality was adequate. The study was technically difficult,    as a result of poor sound wave transmission and body    habitus.  - Left ventricle: The cavity size was normal. There was mild    concentric hypertrophy. Systolic function was normal. The    estimated ejection fraction was in the range of 50% to    55%. Wall motion was normal; there were no regional wall    motion abnormalities.  - Left atrium: The atrium was mildly dilated.  Laboratory Data:  Chemistry Recent Labs  Lab 07/13/21 1706 07/14/21 0253  NA 134* 136  K 3.9 4.0  CL 102 106  CO2 19* 17*  GLUCOSE 298* 202*  BUN 43* 42*  CREATININE 1.76* 1.80*  CALCIUM 8.4* 7.9*  GFRNONAA 32* 31*  ANIONGAP 13 13    Recent Labs  Lab 07/14/21 1136  PROT 6.3*   Hematology Recent Labs  Lab 07/13/21 1706  WBC 10.0  RBC 4.79  HGB 15.4*  HCT 48.1*  MCV 100.4*  MCH 32.2  MCHC 32.0  RDW 15.5  PLT 218   Cardiac EnzymesNo results for input(s): TROPONINI in the last 168 hours. No results for input(s): TROPIPOC in the last 168 hours.  BNP Recent Labs  Lab  07/13/21 1706  BNP 738.3*    DDimer  Recent Labs  Lab 07/13/21 1706  DDIMER 2.54*    Radiology/Studies:  DG Chest 2 View  Result Date: 07/13/2021 IMPRESSION: Cardiac enlargement with mild vascular congestion Mild left lower lobe atelectasis or infiltrate. Electronically Signed   By: Franchot Gallo M.D.   On: 07/13/2021 17:32   CT Angio Chest PE W and/or Wo Contrast  Result Date: 07/13/2021 IMPRESSION: 1. Incomplete opacification of segmental branches of the right lower lobe pulmonary artery. Favor flow related phenomenon and mixing artifact over nonocclusive thrombus. However, PE cannot be excluded. 2. Cardiomegaly, with scattered ground-glass opacities and small bilateral effusions consistent with mild congestive heart failure. 3.  Aortic Atherosclerosis (ICD10-I70.0). Critical Value/emergent results were called by telephone at the time of interpretation on 07/13/2021 at 10:24 pm to provider [facility] , who verbally acknowledged these results. Electronically Signed   By: Randa Ngo M.D.   On: 07/13/2021 22:24   US Venous Img Lower Bilateral (DVT)  Result Date: 07/14/2021 IMPRESSION: No evidence of femoropopliteal DVT within either lower extremity. Michaelle Birks, MD [facility] Radiology Electronically Signed   By: Michaelle Birks M.D.   On: 07/14/2021 10:08    Assessment and Plan:   1. Acute hypoxic respiratory failure: -Briefly required BiPAP, now on room air -Multifactorial including low output CHF, acute on chronic HFrEF possibly ischemic in etiology based on echo and EKG, Afib with RVR, and possible PE -Weaned to room air  2. New onset Afib/flutter with RVR: -Uncertain chronicity  -She remains in Afib with RVR with ventricular rates in the 1-teens bpm, which is tolerable for now -Will need some degree of a faster rate with low output -Low dose metoprolol with consolidation to Toprol or transition to Coreg prior to discharge  given her cardiomyopathy  -CHADS2VASc at least 5 (CHF, age x 1, DM2, vascular disease, sex category) -Heparin gtt for now with recommendation for DOAC at discharge  -May need to pursue TEE-guided DCCV prior to discharge, particularly in the context of her cardiomyopathy   3. Low output acute on chronic HFrEF: -She remains volume up -IV Lasix 40 mg x 1 with reassessment of UOP -Low dose metoprolol as above for now with plans to transition to Toprol or Coreg prior to discharge  -Escalate GDMT as able, hypotension precludes at this time -Echo and EKG changes concerning for ischemic cardiomyopathy  -Plan for [facility] tomorrow  -Ideally, would stop midodrine   4. Elevated high sensitivity troponin: -Minimally elevated and flat trending at 25 x 2 -Plan for [facility] tomorrow as above -Heparin gtt as above  5. AKI: -Uncertain baseline, previously normal in 2021 -Cannot exclude cardiorenal syndrome  -Trial of IV Lasix this afternoon     Shared Decision Making/Informed Consent{  The risks [stroke (1 in 1000), death (1 in 1000), kidney failure [usually temporary] (1 in 500), bleeding (1 in 200), allergic reaction [possibly serious] (1 in 200)], benefits (diagnostic support and management of coronary artery disease) and alternatives of a cardiac catheterization were discussed in detail with Ms. Przybylski and she is willing to proceed.    For questions or updates, please contact [place] Please consult [email or web address] for contact  info under Cardiology/STEMI.   Signed, Christell Faith, PA-C [place] Pager: [phone] 07/14/2021, 3:50 PM

## 2021-07-15 ENCOUNTER — Inpatient Hospital Stay: Payer: Medicare Other

## 2021-07-15 ENCOUNTER — Encounter
Admission: EM | Disposition: A | Discharge status: Home Health | Payer: Self-pay | Source: Home / Self Care | Attending: Pulmonary Disease

## 2021-07-15 ENCOUNTER — Inpatient Hospital Stay: Payer: Self-pay

## 2021-07-15 DIAGNOSIS — I2510 Atherosclerotic heart disease of native coronary artery without angina pectoris: Secondary | ICD-10-CM

## 2021-07-15 DIAGNOSIS — E1169 Type 2 diabetes mellitus with other specified complication: Secondary | ICD-10-CM | POA: Diagnosis not present

## 2021-07-15 DIAGNOSIS — R570 Cardiogenic shock: Secondary | ICD-10-CM

## 2021-07-15 DIAGNOSIS — I5023 Acute on chronic systolic (congestive) heart failure: Secondary | ICD-10-CM | POA: Diagnosis not present

## 2021-07-15 DIAGNOSIS — I5021 Acute systolic (congestive) heart failure: Secondary | ICD-10-CM

## 2021-07-15 DIAGNOSIS — I4891 Unspecified atrial fibrillation: Secondary | ICD-10-CM | POA: Diagnosis not present

## 2021-07-15 DIAGNOSIS — N179 Acute kidney failure, unspecified: Secondary | ICD-10-CM | POA: Diagnosis not present

## 2021-07-15 HISTORY — PX: RIGHT/LEFT HEART CATH AND CORONARY ANGIOGRAPHY: CATH118266

## 2021-07-15 LAB — BASIC METABOLIC PANEL
Anion gap: 13 (ref 5–15)
BUN: 45 mg/dL — ABNORMAL HIGH (ref 8–23)
CO2: 17 mmol/L — ABNORMAL LOW (ref 22–32)
Calcium: 8.2 mg/dL — ABNORMAL LOW (ref 8.9–10.3)
Chloride: 108 mmol/L (ref 98–111)
Creatinine, Ser: 1.62 mg/dL — ABNORMAL HIGH (ref 0.44–1.00)
GFR, Estimated: 35 mL/min — ABNORMAL LOW (ref >60)
Glucose, Bld: 64 mg/dL — ABNORMAL LOW (ref 70–99)
Potassium: 3.9 mmol/L (ref 3.5–5.1)
Sodium: 138 mmol/L (ref 135–145)

## 2021-07-15 LAB — GLUCOSE, CAPILLARY
Glucose-Capillary: 78 mg/dL (ref 70–99)
Glucose-Capillary: 84 mg/dL (ref 70–99)
Glucose-Capillary: 86 mg/dL (ref 70–99)
Glucose-Capillary: 96 mg/dL (ref 70–99)
Glucose-Capillary: 81 mg/dL (ref 70–99)
Glucose-Capillary: 190 mg/dL — ABNORMAL HIGH (ref 70–99)
Glucose-Capillary: 191 mg/dL — ABNORMAL HIGH (ref 70–99)

## 2021-07-15 LAB — CBC
HCT: 50.7 % — ABNORMAL HIGH (ref 36.0–46.0)
Hemoglobin: 16.4 g/dL — ABNORMAL HIGH (ref 12.0–15.0)
MCH: 31.9 pg (ref 26.0–34.0)
MCHC: 32.3 g/dL (ref 30.0–36.0)
MCV: 98.6 fL (ref 80.0–100.0)
Platelets: 166 10*3/uL (ref 150–400)
RBC: 5.14 MIL/uL — ABNORMAL HIGH (ref 3.87–5.11)
RDW: 15.8 % — ABNORMAL HIGH (ref 11.5–15.5)
WBC: 10 10*3/uL (ref 4.0–10.5)
nRBC: 1.5 % — ABNORMAL HIGH (ref 0.0–0.2)

## 2021-07-15 LAB — HEPARIN LEVEL (UNFRACTIONATED): Heparin Unfractionated: 0.79 IU/mL — ABNORMAL HIGH (ref 0.30–0.70)

## 2021-07-15 LAB — D-DIMER, QUANTITATIVE: D-Dimer, Quant: 2 ug/mL-FEU — ABNORMAL HIGH (ref 0.00–0.50)

## 2021-07-15 LAB — PROCALCITONIN: Procalcitonin: 0.33 ng/mL

## 2021-07-15 LAB — MAGNESIUM: Magnesium: 2.2 mg/dL (ref 1.7–2.4)

## 2021-07-15 LAB — PHOSPHORUS: Phosphorus: 4.2 mg/dL (ref 2.5–4.6)

## 2021-07-15 SURGERY — RIGHT/LEFT HEART CATH AND CORONARY ANGIOGRAPHY
Anesthesia: Moderate Sedation

## 2021-07-15 MED ORDER — SODIUM CHLORIDE 0.9 % IV SOLN: 250.0000 mL | INTRAVENOUS | Status: DC | PRN

## 2021-07-15 MED ORDER — SODIUM CHLORIDE 0.9% FLUSH: 3.0000 mL | INTRAVENOUS | Status: DC | PRN

## 2021-07-15 MED ORDER — SODIUM CHLORIDE 0.9 % IV SOLN: INTRAVENOUS | Status: DC

## 2021-07-15 MED ORDER — IOHEXOL 300 MG/ML  SOLN
INTRAMUSCULAR | Status: DC | PRN
  Administered 2021-07-15: 28 mL

## 2021-07-15 MED ORDER — ACETAMINOPHEN 325 MG PO TABS: 650.0000 mg | ORAL_TABLET | ORAL | Status: DC | PRN

## 2021-07-15 MED ORDER — MIDAZOLAM HCL 2 MG/2ML IJ SOLN
INTRAMUSCULAR | Status: AC
  Filled 2021-07-15: qty 2

## 2021-07-15 MED ORDER — INSULIN ASPART 100 UNIT/ML IJ SOLN
0.0000 [IU] | INTRAMUSCULAR | Status: DC
  Administered 2021-07-15 (×2): 3 [IU] via SUBCUTANEOUS
  Administered 2021-07-15: 8 [IU] via SUBCUTANEOUS
  Filled 2021-07-15 (×3): qty 1

## 2021-07-15 MED ORDER — FENTANYL CITRATE (PF) 100 MCG/2ML IJ SOLN
INTRAMUSCULAR | Status: DC | PRN
  Administered 2021-07-15: 25 ug via INTRAVENOUS

## 2021-07-15 MED ORDER — ROSUVASTATIN CALCIUM 10 MG PO TABS
10.0000 mg | ORAL_TABLET | Freq: Every day | ORAL | Status: DC
  Administered 2021-07-15 – 2021-07-24 (×10): 10 mg via ORAL
  Filled 2021-07-15 (×10): qty 1

## 2021-07-15 MED ORDER — HEPARIN (PORCINE) 25000 UT/250ML-% IV SOLN
1000.0000 [IU]/h | INTRAVENOUS | Status: DC
  Administered 2021-07-16: 950 [IU]/h via INTRAVENOUS
  Administered 2021-07-17 – 2021-07-20 (×4): 1000 [IU]/h via INTRAVENOUS
  Filled 2021-07-15 (×5): qty 250

## 2021-07-15 MED ORDER — HEPARIN (PORCINE) IN NACL 1000-0.9 UT/500ML-% IV SOLN
INTRAVENOUS | Status: AC
  Filled 2021-07-15: qty 1000

## 2021-07-15 MED ORDER — FUROSEMIDE 10 MG/ML IJ SOLN
40.0000 mg | Freq: Two times a day (BID) | INTRAMUSCULAR | Status: DC
  Administered 2021-07-15 – 2021-07-18 (×6): 40 mg via INTRAVENOUS
  Filled 2021-07-15 (×6): qty 4

## 2021-07-15 MED ORDER — ASPIRIN 81 MG PO CHEW
CHEWABLE_TABLET | ORAL | Status: DC | PRN
  Administered 2021-07-15: 81 mg via ORAL

## 2021-07-15 MED ORDER — AMIODARONE HCL IN DEXTROSE 360-4.14 MG/200ML-% IV SOLN
30.0000 mg/h | INTRAVENOUS | Status: DC
  Administered 2021-07-15 – 2021-07-17 (×4): 30 mg/h via INTRAVENOUS
  Administered 2021-07-17 – 2021-07-18 (×5): 60 mg/h via INTRAVENOUS
  Administered 2021-07-19: 30 mg/h via INTRAVENOUS
  Administered 2021-07-19 (×2): 60 mg/h via INTRAVENOUS
  Administered 2021-07-20: 30 mg/h via INTRAVENOUS
  Filled 2021-07-15 (×13): qty 200

## 2021-07-15 MED ORDER — LIDOCAINE HCL (PF) 1 % IJ SOLN
INTRAMUSCULAR | Status: DC | PRN
  Administered 2021-07-15: 2 mL

## 2021-07-15 MED ORDER — DEXTROSE 50 % IV SOLN: 12.5000 g | INTRAVENOUS | Status: AC

## 2021-07-15 MED ORDER — NOREPINEPHRINE 16 MG/250ML-% IV SOLN
0.0000 ug/min | INTRAVENOUS | Status: DC
  Administered 2021-07-15: 10 ug/min via INTRAVENOUS
  Administered 2021-07-19: 6 ug/min via INTRAVENOUS
  Filled 2021-07-15 (×2): qty 250

## 2021-07-15 MED ORDER — ONDANSETRON HCL 4 MG/2ML IJ SOLN: 4.0000 mg | Freq: Four times a day (QID) | INTRAMUSCULAR | Status: DC | PRN

## 2021-07-15 MED ORDER — ASPIRIN 81 MG PO CHEW
CHEWABLE_TABLET | ORAL | Status: AC
  Filled 2021-07-15: qty 1

## 2021-07-15 MED ORDER — MIDAZOLAM HCL 2 MG/2ML IJ SOLN
INTRAMUSCULAR | Status: DC | PRN
  Administered 2021-07-15: 1 mg via INTRAVENOUS

## 2021-07-15 MED ORDER — VERAPAMIL HCL 2.5 MG/ML IV SOLN
INTRAVENOUS | Status: DC | PRN
  Administered 2021-07-15: 2.5 mg via INTRAVENOUS

## 2021-07-15 MED ORDER — FENTANYL CITRATE (PF) 100 MCG/2ML IJ SOLN
INTRAMUSCULAR | Status: AC
  Filled 2021-07-15: qty 2

## 2021-07-15 MED ORDER — SODIUM CHLORIDE 0.9% FLUSH
3.0000 mL | Freq: Two times a day (BID) | INTRAVENOUS | Status: DC
  Administered 2021-07-15 – 2021-07-16 (×3): 3 mL via INTRAVENOUS

## 2021-07-15 MED ORDER — SODIUM CHLORIDE 0.9% FLUSH
10.0000 mL | Freq: Two times a day (BID) | INTRAVENOUS | Status: DC
  Administered 2021-07-15 – 2021-07-21 (×11): 10 mL
  Administered 2021-07-21: 30 mL
  Administered 2021-07-22 – 2021-07-24 (×5): 10 mL

## 2021-07-15 MED ORDER — MILRINONE LACTATE IN DEXTROSE 20-5 MG/100ML-% IV SOLN
0.2500 ug/kg/min | INTRAVENOUS | Status: DC
  Administered 2021-07-15 – 2021-07-20 (×8): 0.25 ug/kg/min via INTRAVENOUS
  Filled 2021-07-15 (×8): qty 100

## 2021-07-15 MED ORDER — AMIODARONE HCL IN DEXTROSE 360-4.14 MG/200ML-% IV SOLN
60.0000 mg/h | INTRAVENOUS | Status: DC
  Administered 2021-07-15: 60 mg/h via INTRAVENOUS
  Filled 2021-07-15: qty 200

## 2021-07-15 MED ORDER — HEPARIN SODIUM (PORCINE) 1000 UNIT/ML IJ SOLN
INTRAMUSCULAR | Status: DC | PRN
  Administered 2021-07-15: 4500 [IU] via INTRAVENOUS

## 2021-07-15 MED ORDER — HEPARIN SODIUM (PORCINE) 1000 UNIT/ML IJ SOLN
INTRAMUSCULAR | Status: AC
  Filled 2021-07-15: qty 10

## 2021-07-15 MED ORDER — ASPIRIN 81 MG PO CHEW: 81.0000 mg | CHEWABLE_TABLET | ORAL | Status: DC

## 2021-07-15 MED ORDER — SODIUM CHLORIDE 0.9% FLUSH: 10.0000 mL | INTRAVENOUS | Status: DC | PRN

## 2021-07-15 MED ORDER — VERAPAMIL HCL 2.5 MG/ML IV SOLN
INTRAVENOUS | Status: AC
  Filled 2021-07-15: qty 2

## 2021-07-15 MED ORDER — HEPARIN (PORCINE) IN NACL 1000-0.9 UT/500ML-% IV SOLN
INTRAVENOUS | Status: DC | PRN
  Administered 2021-07-15 (×2): 500 mL

## 2021-07-15 SURGICAL SUPPLY — 12 items
CATH 5F 110X4 TIG (CATHETERS) ×1 IMPLANT
CATH BALLN WEDGE 5F 110CM (CATHETERS) ×1 IMPLANT
DEVICE RAD TR BAND REGULAR (VASCULAR PRODUCTS) ×1 IMPLANT
DRAPE BRACHIAL (DRAPES) ×2 IMPLANT
GLIDESHEATH SLEND SS 6F .021 (SHEATH) ×1 IMPLANT
GUIDEWIRE INQWIRE 1.5J.035X260 (WIRE) IMPLANT
INQWIRE 1.5J .035X260CM (WIRE) ×2
PACK CARDIAC CATH (CUSTOM PROCEDURE TRAY) ×2 IMPLANT
PROTECTION STATION PRESSURIZED (MISCELLANEOUS) ×2
SET ATX SIMPLICITY (MISCELLANEOUS) ×1 IMPLANT
SHEATH GLIDE SLENDER 4/5FR (SHEATH) ×1 IMPLANT
STATION PROTECTION PRESSURIZED (MISCELLANEOUS) IMPLANT

## 2021-07-15 NOTE — Consult Note (Signed)
PHARMACY CONSULT NOTE - FOLLOW UP  Pharmacy Consult for Electrolyte Monitoring and Replacement   Recent Labs: Potassium (mmol/L)  Date Value  07/15/2021 3.9   Magnesium (mg/dL)  Date Value  07/15/2021 2.2   Calcium (mg/dL)  Date Value  07/15/2021 8.2 (L)   Albumin (g/dL)  Date Value  06/22/2021 4.6   Phosphorus (mg/dL)  Date Value  07/15/2021 4.2   Sodium (mmol/L)  Date Value  07/15/2021 138  06/22/2021 138     Assessment: 66 y.o female with significant PMH of OSA on CPAP, hypothyroidism, DM, HTN, CHF, DCIS, neutrophilia, HLD, CAD, who presented to the ED with chief complaints of  SOB x1 week, with worsening respiratory status. CT concerning for possible PE. Pharmacy consulted for mgmt of electrolytes and renal adjustment monitoring.   Goal of Therapy:  Lytes WNL  Plan:  Lytes WNL today. Will CTM and replete as needed PRN. No repletion at this time. BMP, Mg, Phos ordered with AM labs.  Lorna Dibble, PharmD, [facility] Clinical Pharmacist 07/15/2021 7:45 AM

## 2021-07-15 NOTE — Consult Note (Signed)
ANTICOAGULATION CONSULT NOTE - Initial Consult  Pharmacy Consult for heparin infusion Indication: pulmonary embolus  No Known Allergies  Patient Measurements: Height: 5\' 2"  (157.5 cm) Weight: 93.2 kg (205 lb 7.5 oz) IBW/kg (Calculated) : 50.1 Heparin Dosing Weight: 71.6 kg  Vital Signs: Temp: 98.6 F (37 C) (01/06 0400) BP: 97/70 (01/06 0500) Pulse Rate: 81 (01/06 0400)  Labs: Recent Labs    07/13/21 1706 07/13/21 1925 07/14/21 0253 07/14/21 0506 07/14/21 1136 07/14/21 1928 07/15/21 0511  HGB 15.4*  --   --   --   --   --  16.4*  HCT 48.1*  --   --   --   --   --  50.7*  PLT 218  --   --   --   --   --  166  APTT  --   --   --  30  --   --   --   LABPROT  --   --   --  17.2*  --   --   --   INR  --   --   --  1.4*  --   --   --   HEPARINUNFRC  --   --   --   --  0.78* >1.10* 0.79*  CREATININE 1.76*  --  1.80*  --   --   --  1.62*  TROPONINIHS 25* 25*  --   --   --   --   --      Estimated Creatinine Clearance: 36.8 mL/min (A) (by C-G formula based on SCr of 1.62 mg/dL (H)).   Medical History: Past Medical History:  Diagnosis Date   Abscess of breast, right 08/19/2012   Breast cancer ([place]) 07/13/2010   1.5 cm,intermediate grade DCIS, nuclear grade 2, ER 90%, PR 90% treated with wide excision, reexcision to negative margins and MammoSite partial breast radiation.   CHF (congestive heart failure) ([place])    Coronary artery disease 2009   DCIS (ductal carcinoma in situ) of breast, right 2012   Diabetes mellitus (HCC)    Type II   GERD (gastroesophageal reflux disease)    Hyperlipidemia    Hypertension    Ischemic cardiomyopathy    Obesity, unspecified    Personal history of radiation therapy    Sleep apnea    Thyroid disease    hypothyroidism   Umbilical hernia without mention of obstruction or gangrene     Medications:  No prior AC noted  Heparin Dosing Weight: 71.6 kg  Assessment: 66 y.o female with significant PMH of OSA on CPAP, hypothyroidism,  diabetes mellitus, hypertension, CHF, DCIS, neutrophilia, hyperlipidemia, CAD, who presented to the ED with chief complaints of  shortness of breath x1 week with worsening respiratory status. CT concerning for possible PE.  1/5 1928 HL > 1.1  1/6 0511 HL 0.79   Goal of Therapy:  Heparin level 0.3-0.7 units/ml Monitor platelets by anticoagulation protocol: Yes   Plan:  Heparin level is supratherapeutic. Willdecrease heparin rate to 900 units/hr. Recheck heparin level in 6 hours. CBC daily while on heparin.   Dorothe Pea, PharmD, BCPS Clinical Pharmacist   07/15/2021,6:05 AM

## 2021-07-15 NOTE — Progress Notes (Signed)
Progress Note  Patient Name: Michele [name] Date of Encounter: 07/15/2021  [facility] Cardiologist: Rockey Situ  Subjective   She continues to be in atrial fibrillation.  She reports continued dyspnea at rest.  No chest pain.  Inpatient Medications    Scheduled Meds:  budesonide (PULMICORT) nebulizer solution  0.25 mg Nebulization BID   Chlorhexidine Gluconate Cloth  6 each Topical Daily   colchicine  0.6 mg Oral Daily   dextrose  12.5 g Intravenous STAT   insulin aspart  0-20 Units Subcutaneous Q4H   levothyroxine  200 mcg Oral Q0600   methylPREDNISolone (SOLU-MEDROL) injection  40 mg Intravenous Q12H   metoprolol tartrate  12.5 mg Oral BID   mupirocin ointment  1 application Nasal BID   Continuous Infusions:  sodium chloride Stopped (07/14/21 0344)   sodium chloride     heparin 900 Units/hr (07/15/21 8250)   norepinephrine (LEVOPHED) Adult infusion Stopped (07/14/21 0141)   phenylephrine (NEO-SYNEPHRINE) Adult infusion Stopped (07/15/21 0416)   PRN Meds: docusate sodium, metoprolol tartrate, polyethylene glycol   Vital Signs    Vitals:   07/15/21 0414 07/15/21 0500 07/15/21 0600 07/15/21 0811  BP:  97/70 102/75   Pulse:      Resp:  (!) 24 (!) 25   Temp:      TempSrc:      SpO2:  92%  92%  Weight: 93.2 kg     Height:        Intake/Output Summary (Last 24 hours) at 07/15/2021 0820 Last data filed at 07/15/2021 5397 Gross per 24 hour  Intake 1267.65 ml  Output 600 ml  Net 667.65 ml   Last 3 Weights 07/15/2021 07/14/2021 07/13/2021  Weight (lbs) 205 lb 7.5 oz 205 lb 0.4 oz 202 lb  Weight (kg) 93.2 kg 93 kg 91.627 kg      Telemetry    Atrial fibrillation with ventricular rate between 80 to 120 bpm.- Personally Reviewed  ECG     - Personally Reviewed  Physical Exam   GEN: No acute distress.   Neck: Mild JVD Cardiac: Irregularly irregular, no murmurs, rubs, or gallops.  Respiratory: Clear to auscultation bilaterally. GI: Soft, nontender, non-distended  MS:  No edema; No deformity. Neuro:  Nonfocal  Psych: Normal affect   Labs    High Sensitivity Troponin:   Recent Labs  Lab 07/13/21 1706 07/13/21 1925  TROPONINIHS 25* 25*     Chemistry Recent Labs  Lab 07/13/21 1706 07/14/21 0253 07/14/21 1136 07/15/21 0511  NA 134* 136  --  138  K 3.9 4.0  --  3.9  CL 102 106  --  108  CO2 19* 17*  --  17*  GLUCOSE 298* 202*  --  64*  BUN 43* 42*  --  45*  CREATININE 1.76* 1.80*  --  1.62*  CALCIUM 8.4* 7.9*  --  8.2*  MG  --  2.2  --  2.2  PROT  --   --  6.3*  --   GFRNONAA 32* 31*  --  35*  ANIONGAP 13 13  --  13    Lipids No results for input(s): CHOL, TRIG, HDL, LABVLDL, LDLCALC, CHOLHDL in the last 168 hours.  Hematology Recent Labs  Lab 07/13/21 1706 07/15/21 0511  WBC 10.0 10.0  RBC 4.79 5.14*  HGB 15.4* 16.4*  HCT 48.1* 50.7*  MCV 100.4* 98.6  MCH 32.2 31.9  MCHC 32.0 32.3  RDW 15.5 15.8*  PLT 218 166   Thyroid  Recent Labs  Lab 07/13/21 1925  TSH 8.127*  FREET4 0.65    BNP Recent Labs  Lab 07/13/21 1706  BNP 738.3*    DDimer  Recent Labs  Lab 07/13/21 1706 07/15/21 0511  DDIMER 2.54* 2.00*     Radiology    DG Chest 2 View  Result Date: 07/13/2021 CLINICAL DATA:  Short of breath and weakness EXAM: CHEST - 2 VIEW COMPARISON:  03/01/2012 FINDINGS: Cardiac enlargement with mild vascular congestion. Negative for edema or effusion. Mild atelectasis or infiltrate in the left lung base. IMPRESSION: Cardiac enlargement with mild vascular congestion Mild left lower lobe atelectasis or infiltrate. Electronically Signed   By: Franchot Gallo M.D.   On: 07/13/2021 17:32   CT Angio Chest PE W and/or Wo Contrast  Result Date: 07/13/2021 CLINICAL DATA:  Short of breath, hypoxia EXAM: CT ANGIOGRAPHY CHEST WITH CONTRAST TECHNIQUE: Multidetector CT imaging of the chest was performed using the standard protocol during bolus administration of intravenous contrast. Multiplanar CT image reconstructions and MIPs were obtained  to evaluate the vascular anatomy. CONTRAST:  53mL OMNIPAQUE IOHEXOL 350 MG/ML SOLN COMPARISON:  07/13/2021 FINDINGS: Cardiovascular: This is a technically adequate evaluation of the pulmonary vasculature. There is some mixing artifact within the pulmonary vasculature. Within the lateral basilar segmental branches of the right lower lobe pulmonary artery, there is incomplete filling. It is unclear whether this reflects a nonocclusive pulmonary embolus versus flow related phenomenon. Pulmonary embolus cannot be excluded in this distribution. No other filling defects are identified. The heart is enlarged without pericardial effusion. Normal caliber of the thoracic aorta. Evaluation of the aortic lumen is limited due to the timing of contrast bolus. Mediastinum/Nodes: No enlarged mediastinal, hilar, or axillary lymph nodes. Thyroid gland, trachea, and esophagus demonstrate no significant findings. Lungs/Pleura: There are small bilateral pleural effusions. Consolidation within the lower lobes likely reflects atelectasis. There is scattered ground-glass opacities within the upper lung zones, favor edema. No pneumothorax. Central airways are patent. Upper Abdomen: No acute abnormality. Musculoskeletal: No acute or destructive bony lesions. Reconstructed images demonstrate no additional findings. Review of the MIP images confirms the above findings. IMPRESSION: 1. Incomplete opacification of segmental branches of the right lower lobe pulmonary artery. Favor flow related phenomenon and mixing artifact over nonocclusive thrombus. However, PE cannot be excluded. 2. Cardiomegaly, with scattered ground-glass opacities and small bilateral effusions consistent with mild congestive heart failure. 3.  Aortic Atherosclerosis (ICD10-I70.0). Critical Value/emergent results were called by telephone at the time of interpretation on 07/13/2021 at 10:24 pm to provider [facility] , who verbally acknowledged these results. Electronically  Signed   By: Randa Ngo M.D.   On: 07/13/2021 22:24   [place] CHEST (PLEURAL EFFUSION)  Result Date: 07/14/2021 CLINICAL DATA:  Evaluate pleural fluid EXAM: CHEST ULTRASOUND COMPARISON:  None. FINDINGS: Limited sonographic exam of the bilateral chest was performed for fluid assessment. Images demonstrate small left and trace right pleural effusions. No safe percutaneous window is present on the left side. Fluid volume is insufficient on the right for safe image guided thoracentesis. No thoracentesis performed. IMPRESSION: There is a small left and trace right pleural effusion. No safe window/insufficient fluid volume for safe image guided thoracentesis. No thoracentesis performed. Electronically Signed   By: Albin Felling M.D.   On: 07/14/2021 12:54   US Venous Img Lower Bilateral (DVT)  Result Date: 07/14/2021 CLINICAL DATA:  Bilateral lower extremity edema.  CHF. EXAM: BILATERAL LOWER EXTREMITY VENOUS DOPPLER ULTRASOUND TECHNIQUE: Gray-scale sonography with graded compression, as well as color Doppler  and duplex ultrasound were performed to evaluate the lower extremity deep venous systems from the level of the common femoral vein and including the common femoral, femoral, profunda femoral, popliteal and calf veins including the posterior tibial, peroneal and gastrocnemius veins when visible. The superficial great saphenous vein was also interrogated. Spectral Doppler was utilized to evaluate flow at rest and with distal augmentation maneuvers in the common femoral, femoral and popliteal veins. COMPARISON:  Chest XR, 07/13/2021. FINDINGS: RIGHT LOWER EXTREMITY VENOUS Normal compressibility of the RIGHT common femoral, superficial femoral, and popliteal veins, as well as the visualized calf veins. Visualized portions of profunda femoral vein and great saphenous vein unremarkable. No filling defects to suggest DVT on grayscale or color Doppler imaging. Doppler waveforms show normal direction of venous flow,  normal respiratory plasticity and response to augmentation. OTHER No evidence of superficial thrombophlebitis or abnormal fluid collection. Limitations: none LEFT LOWER EXTREMITY VENOUS Normal compressibility of the LEFT common femoral, superficial femoral, and popliteal veins, as well as the visualized calf veins. Visualized portions of profunda femoral vein and great saphenous vein unremarkable. No filling defects to suggest DVT on grayscale or color Doppler imaging. Doppler waveforms show normal direction of venous flow, normal respiratory plasticity and response to augmentation. OTHER No evidence of superficial thrombophlebitis or abnormal fluid collection. Limitations: none IMPRESSION: No evidence of femoropopliteal DVT within either lower extremity. Michaelle Birks, MD [facility] Radiology Electronically Signed   By: Michaelle Birks M.D.   On: 07/14/2021 10:08   ECHOCARDIOGRAM COMPLETE  Result Date: 07/14/2021    ECHOCARDIOGRAM REPORT   Patient Name:   METZLI POLLICK Date of Exam: 07/14/2021 Medical Rec #:  [id number]      Height:       62.0 in Accession #:    [id number]     Weight:       205.0 lb Date of Birth:  Mar 02, 1956       BSA:          1.932 m Patient Age:    66 years       BP:           101/62 mmHg Patient Gender: F              HR:           127 bpm. Exam Location:  [facility] Procedure: 2D Echo, Cardiac Doppler and Color Doppler Indications:     CHF-acute systolic 71.06                  CHF-acute diastolic Y69.48                  Atrial Fibrillation I48.91  History:         Patient has no prior history of Echocardiogram examinations.                  CHF and Ischemic cardiomyopathy, CAD; Risk                  Factors:Hypertension.  Sonographer:     Sherrie Sport Referring Phys:  [place] Diagnosing Phys: Ida Rogue MD IMPRESSIONS  1. Left ventricular ejection fraction, by estimation, is <20%. The left ventricle has severely decreased  function. The left ventricle demonstrates global hypokinesis , inferior and inferolateral wall motion best preserved. The left ventricular internal cavity size was moderately dilated. Left ventricular diastolic parameters are indeterminate.  2. Right ventricular systolic function is moderately reduced. The right ventricular  size is normal. There is normal pulmonary artery systolic pressure.  3. Left atrial size was severely dilated.  4. Right atrial size was mildly dilated.  5. A small pericardial effusion is present.  6. The mitral valve is normal in structure. Mild to moderate mitral valve regurgitation. No evidence of mitral stenosis.  7. The inferior vena cava is normal in size with greater than 50% respiratory variability, suggesting right atrial pressure of 3 mmHg. FINDINGS  Left Ventricle: Left ventricular ejection fraction, by estimation, is <20%. The left ventricle has severely decreased function. The left ventricle demonstrates global hypokinesis. The left ventricular internal cavity size was moderately dilated. There is no left ventricular hypertrophy. Left ventricular diastolic parameters are indeterminate. Right Ventricle: The right ventricular size is normal. No increase in right ventricular wall thickness. Right ventricular systolic function is moderately reduced. There is normal pulmonary artery systolic pressure. The tricuspid regurgitant velocity is 2.32 m/s, and with an assumed right atrial pressure of 5 mmHg, the estimated right ventricular systolic pressure is 44.8 mmHg. Left Atrium: Left atrial size was severely dilated. Right Atrium: Right atrial size was mildly dilated. Pericardium: A small pericardial effusion is present. Mitral Valve: The mitral valve is normal in structure. Mild to moderate mitral valve regurgitation. No evidence of mitral valve stenosis. Tricuspid Valve: The tricuspid valve is normal in structure. Tricuspid valve regurgitation is mild . No evidence of tricuspid stenosis.  Aortic Valve: The aortic valve was not well visualized. Aortic valve regurgitation is not visualized. No aortic stenosis is present. Aortic valve mean gradient measures 0.0 mmHg. Aortic valve peak gradient measures 0.9 mmHg. Aortic valve area, by VTI measures 2.95 cm. Pulmonic Valve: The pulmonic valve was normal in structure. Pulmonic valve regurgitation is not visualized. No evidence of pulmonic stenosis. Aorta: The aortic root is normal in size and structure. Venous: The inferior vena cava is normal in size with greater than 50% respiratory variability, suggesting right atrial pressure of 3 mmHg. IAS/Shunts: No atrial level shunt detected by color flow Doppler.  LEFT VENTRICLE PLAX 2D LVIDd:         5.20 cm LVIDs:         5.10 cm LV PW:         0.90 cm LV IVS:        0.85 cm LVOT diam:     2.00 cm LV SV:         15 LV SV Index:   8 LVOT Area:     3.14 cm  LV Volumes (MOD) LV vol d, MOD A2C: 97.0 ml LV vol d, MOD A4C: 88.1 ml LV vol s, MOD A2C: 94.4 ml LV vol s, MOD A4C: 77.0 ml LV SV MOD A2C:     2.6 ml LV SV MOD A4C:     88.1 ml LV SV MOD BP:      7.9 ml RIGHT VENTRICLE RV Basal diam:  2.60 cm RV S prime:     7.94 cm/s TAPSE (M-mode): 3.7 cm LEFT ATRIUM              Index        RIGHT ATRIUM           Index LA diam:        4.90 cm  2.54 cm/m   RA Area:     24.90 cm LA Vol (A2C):   118.0 ml 61.08 ml/m  RA Volume:   74.70 ml  38.67 ml/m LA Vol (A4C):   106.0 ml  54.87 ml/m LA Biplane Vol: 115.0 ml 59.52 ml/m  AORTIC VALVE                    PULMONIC VALVE AV Area (Vmax):    2.21 cm     PV Vmax:        0.31 m/s AV Area (Vmean):   2.48 cm     PV Vmean:       19.400 cm/s AV Area (VTI):     2.95 cm     PV VTI:         0.041 m AV Vmax:           47.30 cm/s   PV Peak grad:   0.4 mmHg AV Vmean:          31.500 cm/s  PV Mean grad:   0.0 mmHg AV VTI:            0.050 m      RVOT Peak grad: 1 mmHg AV Peak Grad:      0.9 mmHg AV Mean Grad:      0.0 mmHg LVOT Vmax:         33.30 cm/s LVOT Vmean:        24.900 cm/s  LVOT VTI:          0.047 m LVOT/AV VTI ratio: 0.94  AORTA Ao Root diam: 2.90 cm MITRAL VALVE                TRICUSPID VALVE MV Area (PHT): 6.48 cm     TR Peak grad:   21.5 mmHg MV Decel Time: 117 msec     TR Vmax:        232.00 cm/s MV E velocity: 122.00 cm/s                             SHUNTS                             Systemic VTI:  0.05 m                             Systemic Diam: 2.00 cm                             Pulmonic VTI:  0.091 m Ida Rogue MD Electronically signed by Ida Rogue MD Signature Date/Time: 07/14/2021/12:44:22 PM    Final     Cardiac Studies   Echocardiogram was done yesterday and was personally reviewed by me.  It showed an EF of 10 to 15% with multiple wall motion abnormalities in the anterior, anterolateral and mid to distal inferior.  Pulmonary pressure was normal with mild to moderate mitral regurgitation.  Patient Profile     66 y.o. female with previous history of nonischemic cardiomyopathy with subsequent improvement in ejection fraction, prolonged history of diabetes mellitus, hyperlipidemia and obstructive sleep apnea who presented with newly diagnosed A. fib with RVR and acute on chronic systolic heart failure.  Assessment & Plan    1.  Acute on chronic systolic heart failure with cardiogenic shock in the setting of atrial fibrillation with rapid ventricular response.  Echocardiogram showed an EF of 10 to 15%.  She was hypotensive requiring norepinephrine drip but that has been weaned off.  Blood pressure is stable now.  I suspect that her cardiac output is very low but no significant volume overload. I agree that the right and left cardiac catheterization is indicated and the patient is scheduled for today.  I discussed the procedure in details as well as risk and benefits. At the present time, her only heart failure medication is metoprolol which is being used mostly for rate control of atrial fibrillation.  Initiation of heart failure medications limited by  hypotension and acute kidney injury.  2.  Atrial fibrillation with rapid ventricular response: This is likely significantly contributing to heart failure.  Suspect that the patient will require TEE guided cardioversion likely with addition of IV amiodarone to have a good chance of being able to get her back in sinus rhythm.  Continue anticoagulation with heparin drip for now.  3.  Poorly controlled diabetes mellitus with hemoglobin A1c greater than 10.  The patient is at risk for three-vessel coronary artery disease.  4.  Acute on chronic kidney disease: Renal function is stable.  We will decide whether to diurese or not based on right heart cath data.  Total encounter time more than 35 minutes. Greater than 50% was spent in counseling and coordination of care with the patient.      For questions or updates, please contact [place] Please consult [email or web address] for contact info under        Signed, Kathlyn [place], MD  07/15/2021, 8:20 AM

## 2021-07-15 NOTE — Progress Notes (Signed)
Right and left cardiac catheterization done earlier today showed:  1.  Significant one-vessel coronary artery disease with 70% stenosis in the mid LAD.  Sluggish TIMI II flow in all coronary arteries likely due to severely reduced cardiac output. 2.  Left ventricular angiography was not performed due to acute kidney injury.  EF was severely reduced by echo. 3.  Right heart catheterization showed moderately elevated filling pressures, mild pulmonary hypertension and severely reduced cardiac output.  Hemodynamics are consistent with cardiogenic shock with a cardiac output of 2.19 and cardiac index of 1.13.   Recommendations: The patient has nonischemic cardiomyopathy as the stenosis in the LAD does not explain degree of decreased ejection fraction. She is volume overloaded with critically low cardiac output.  I am going to start the patient on milrinone drip and start IV diuresis.  I stopped metoprolol. Atrial fibrillation is complicating the picture and for now we will use IV amiodarone for rate control with plans to proceed with TEE guided cardioversion early next week.   I ordered a PICC line placement which was done. I came to check on the patient and her blood pressure was 79/63 before starting milrinone.  Hypotension is expected to worsen with initiation of milrinone.  Thus, I discussed with the nurse to start norepinephrine drip at 5 mcg.  Milrinone to be started once systolic blood pressure is above 90. Continue IV diuresis. Continue amiodarone drip without bolus for atrial fibrillation with plans for TEE guided cardioversion early next week.  If no significant improvement with the above, recommend transfer to the advanced heart failure service at [facility] consider left ventricular assist device.  Critical care time of 30 minutes.

## 2021-07-15 NOTE — Progress Notes (Signed)
NAME:  Michele [name], MRN:  [id number], DOB:  [birth year], LOS: 2 ADMISSION DATE:  07/13/2021, CONSULTATION DATE:  07/13/2021 REFERRING MD:  Nance Pear, MD  CHIEF COMPLAINT:  SOB    HPI  66 y.o female with significant PMH of OSA on CPAP, hypothyroidism, diabetes mellitus, hypertension, CHF, DCIS, neutrophilia, hyperlipidemia, CAD, who presented to the ED with chief complaints of  shortness of breath x1 week  ED Course: On arrival to the ED, she was afebrile with initial blood pressure 100/57 mm Hg and pulse rate146  beats/min, RR 22 sats 100% on RA.Marland Kitchen There were no focal neurological deficits; patient was alert oriented x4.  Chest x-ray was obtained showed cardiac enlargement with mild vascular congestion and mild left lower lobe atelectasis or infiltrate.  CTA angio chest showed incomplete opacification of segmental branches of the right lower lobe pulmonary artery, PE could not be excluded.  Cardiomegaly with scattered groundglass opacities and small bilateral effusion consistent with mild congestive heart failure. Pertinent Labs in Red/Diagnostics   07/15/21-  patient for cardiac cath. PT/OT after this and swallow evaluation. 07/16/20- patient s/p procedure , she is improved. Optimizing for TRH transfer.   Past Medical History  OSA on CPAP, hypothyroidism, diabetes mellitus, hypertension, CHF, DCIS, neutrophilia, hyperlipidemia, CAD,  Significant Hospital Events   1/4: Admitted to ICU with acute hypoxic respiratory failure  Consults:  Vascular  Procedures:  None  Significant Diagnostic Tests:  1/4: Chest Xray>Cardiac enlargement with mild vascular congestion Mild left lower lobe atelectasis or infiltrate 1/4: CTA Chest>1. Incomplete opacification of segmental branches of the right lower lobe pulmonary artery. Favor flow related phenomenon and mixing artifact over nonocclusive thrombus. However, PE cannot be excluded. 2. Cardiomegaly, with scattered ground-glass opacities and  small bilateral effusions consistent with mild congestive heart failure.  Micro Data:  1/4: SARS-CoV-2 PCR> negative 1/4: Influenza PCR> negative 1/5: Blood culture x2> 1/5: Urine Culture> 1/4: MRSA PCR>>  1/4: Strep pneumo urinary antigen> 1/4: Legionella urinary antigen>  Antimicrobials:  Azithromycin 1/4> Ceftriaxone 1/4>  OBJECTIVE  Blood pressure 106/76, pulse 88, temperature (!) 96 F (35.6 C), resp. rate (!) 27, height _0  (1.575 m), weight 93.2 kg, SpO2 93 %.        Intake/Output Summary (Last 24 hours) at 07/15/2021 1010 Last data filed at 07/15/2021 4158 Gross per 24 hour  Intake 1267.65 ml  Output 600 ml  Net 667.65 ml    Filed Weights   07/14/21 0105 07/15/21 0414 07/15/21 0946  Weight: 93 kg 93.2 kg 93.2 kg     Physical Examination  GENERAL: 66 year-old critically ill patient lying in the bed with no acute distress on BiPAP EYES: Pupils equal, round, reactive to light and accommodation. No scleral icterus. Extraocular muscles intact.  HEENT: Head atraumatic, normocephalic. Oropharynx and nasopharynx clear.  NECK:  Supple, no jugular venous distention. No thyroid enlargement, no tenderness.  LUNGS: Normal breath sounds bilaterally, no wheezing, rales,rhonchi or crepitation. No use of accessory muscles of respiration.  CARDIOVASCULAR: S1, S2 normal. No murmurs, rubs, or gallops.  ABDOMEN: Soft, nontender, nondistended. Bowel sounds present. No organomegaly or mass.  EXTREMITIES: No pedal edema, cyanosis, or clubbing.  NEUROLOGIC: Cranial nerves II through XII are intact.  Muscle strength 5/5 in all extremities. Sensation intact. Gait not checked.  PSYCHIATRIC: The patient is on BIPAP SKIN: No obvious rash, lesion, or ulcer.   Labs/imaging that I havepersonally reviewed  (right click and "Reselect all SmartList Selections" daily)     Labs   CBC:  Recent Labs  Lab 07/13/21 1706 07/15/21 0511  WBC 10.0 10.0  HGB 15.4* 16.4*  HCT 48.1* 50.7*  MCV  100.4* 98.6  PLT 218 166     Basic Metabolic Panel: Recent Labs  Lab 07/13/21 1706 07/14/21 0253 07/15/21 0511  NA 134* 136 138  K 3.9 4.0 3.9  CL 102 106 108  CO2 19* 17* 17*  GLUCOSE 298* 202* 64*  BUN 43* 42* 45*  CREATININE 1.76* 1.80* 1.62*  CALCIUM 8.4* 7.9* 8.2*  MG  --  2.2 2.2  PHOS  --  3.6 4.2    GFR: Estimated Creatinine Clearance: 36.8 mL/min (A) (by C-G formula based on SCr of 1.62 mg/dL (H)). Recent Labs  Lab 07/13/21 1706 07/13/21 1925 07/14/21 0003 07/14/21 0253 07/15/21 0511  PROCALCITON  --  <0.10  --  0.18 0.33  WBC 10.0  --   --   --  10.0  LATICACIDVEN  --   --  3.9* 4.2*  --      Liver Function Tests: Recent Labs  Lab 07/14/21 1136  PROT 6.3*   No results for input(s): LIPASE, AMYLASE in the last 168 hours. No results for input(s): AMMONIA in the last 168 hours.  ABG No results found for: PHART, PCO2ART, PO2ART, HCO3, TCO2, ACIDBASEDEF, O2SAT   Coagulation Profile: Recent Labs  Lab 07/14/21 0506  INR 1.4*    Cardiac Enzymes: No results for input(s): CKTOTAL, CKMB, CKMBINDEX, TROPONINI in the last 168 hours.  HbA1C: Hemoglobin A1C  Date/Time Value Ref Range Status  06/22/2021 02:08 PM 10.0 (A) 4.0 - 5.6 % Final  07/30/2018 09:13 AM 9.9 (A) 4.0 - 5.6 % Final   Hgb A1c MFr Bld  Date/Time Value Ref Range Status  11/19/2019 11:43 AM 9.8 (H) 4.8 - 5.6 % Final    Comment:             Prediabetes: 5.7 - 6.4          Diabetes: >6.4          Glycemic control for adults with diabetes: <7.0   03/18/2018 03:53 PM 9.3 (H) 4.8 - 5.6 % Final    Comment:             Prediabetes: 5.7 - 6.4          Diabetes: >6.4          Glycemic control for adults with diabetes: <7.0     CBG: Recent Labs  Lab 07/14/21 1946 07/14/21 2343 07/15/21 0352 07/15/21 0648 07/15/21 0721  GLUCAP 122* 92 78 84 86     Review of Systems:   Unable to obtain patient is on BiPAP  Past Medical History  She,  has a past medical history of Abscess  of breast, right (08/19/2012), Breast cancer ([place]) (07/13/2010), CHF (congestive heart failure) ([place]), Coronary artery disease (2009), DCIS (ductal carcinoma in situ) of breast, right (2012), Diabetes mellitus ([place]), GERD (gastroesophageal reflux disease), Hyperlipidemia, Hypertension, Ischemic cardiomyopathy, Obesity, unspecified, Personal history of radiation therapy, Sleep apnea, Thyroid disease, and Umbilical hernia without mention of obstruction or gangrene.   Surgical History    Past Surgical History:  Procedure Laterality Date   BREAST BIOPSY Right 2007   right bx neg   BREAST BIOPSY Right 08/10/2016   fat necrosis/ done in Dr. Dwyane Luo office   BREAST EXCISIONAL BIOPSY Right 2011   DCIS mammosite lumpectomy   BREAST LUMPECTOMY Right Jan 2012   Wide excision   BREAST MASS EXCISION Right 2011  December   CARDIAC CATHETERIZATION  01/08/2008   CHOLECYSTECTOMY  2013   COLONOSCOPY  2011   Dr. Candace Cruise- [place]  2013   epigastric   INCISION AND DRAINAGE BREAST ABSCESS Right 08/19/2012   TONSILLECTOMY AND ADENOIDECTOMY     age 89 yrs     Social History   reports that she has never smoked. She has never used smokeless tobacco. She reports that she does not drink alcohol and does not use drugs.   Family History   Her family history includes Arthritis in her father; CVA in her mother; Congestive Heart Failure in her mother; Diabetes in her mother and sister; Heart attack in her father, maternal grandfather, and paternal grandmother; Heart failure in her mother; Hemochromatosis in her brother; Hypertension in her brother, brother, and sister; Lung cancer in her sister; Stroke in her maternal grandmother. There is no history of Breast cancer.   Allergies No Known Allergies   Home Medications  Prior to Admission medications   Medication Sig Start Date End Date Taking? Authorizing Provider  allopurinol (ZYLOPRIM) 300 MG tablet Take 1 tablet (300 mg total) by mouth daily. 11/19/19    Jerrol Banana., MD  amLODipine (NORVASC) 10 MG tablet TAKE 1 TABLET BY MOUTH  DAILY 04/16/19   Jerrol Banana., MD  Blood Glucose Monitoring Suppl (ONE TOUCH ULTRA SYSTEM KIT) w/Device KIT Check sugar once daily. DX E11.9 10/18/16   Jerrol Banana., MD  BYSTOLIC 20 MG TABS TAKE 1 TABLET BY MOUTH  DAILY 05/19/20   Jerrol Banana., MD  cetirizine (ZYRTEC) 10 MG tablet Take 1 tablet (10 mg total) by mouth daily. 03/18/18   Jerrol Banana., MD  fluticasone [facility]) 50 MCG/ACT nasal spray Place into the nose. 02/24/13   [provider]  glimepiride (AMARYL) 4 MG tablet TAKE 1 TABLET BY MOUTH  TWICE DAILY 02/25/20   Jerrol Banana., MD  Glucagon (GVOKE HYPOPEN 1-PACK) 1 MG/0.2ML SOAJ Inject 1 mg into the skin every 15 (fifteen) minutes as needed for up to 5 doses. 06/22/21   Myles Gip, DO  glucose blood (ONE TOUCH ULTRA TEST) test strip Use as instructed 12/10/17   Jerrol Banana., MD  insulin glargine (LANTUS SOLOSTAR) 100 UNIT/ML Solostar Pen INJECT 50 UNITS UNDER THE SKIN TWICE DAILY 12/31/19   Jerrol Banana., MD  Insulin Pen Needle 32G X 4 MM MISC Inject twice daily, SQ. DX E11.9 11/19/19   Jerrol Banana., MD  Lancets [facility] ULTRASOFT) lancets Check sugar once daily DX E11.9 12/10/17   Jerrol Banana., MD  levothyroxine (SYNTHROID) 200 MCG tablet TAKE 1 TABLET(200 MCG) BY MOUTH DAILY BEFORE BREAKFAST 06/22/21   Myles Gip, DO  losartan (COZAAR) 100 MG tablet TAKE 1 TABLET BY MOUTH  DAILY 02/06/20   Jerrol Banana., MD  NON FORMULARY CPAP AT BEDTIME    [provider]  rosuvastatin (CRESTOR) 10 MG tablet TAKE 1 TABLET BY MOUTH  DAILY 11/28/19   Minna Merritts, MD  Syringe/Needle, Disp, (SYRINGE 3CC/25GX1") 25G X 1" 3 ML MISC 1 mL by Does not apply route every 30 (thirty) days. 05/03/16   Jerrol Banana., MD  Scheduled Meds:  Doug Sou Hold] budesonide (PULMICORT) nebulizer solution  0.25 mg  Nebulization BID   [MAR Hold] Chlorhexidine Gluconate Cloth  6 each Topical Daily   [MAR Hold] colchicine  0.6 mg Oral Daily   [  MAR Hold] dextrose  12.5 g Intravenous STAT   [MAR Hold] insulin aspart  0-20 Units Subcutaneous Q4H   [MAR Hold] levothyroxine  200 mcg Oral Q0600   [MAR Hold] methylPREDNISolone (SOLU-MEDROL) injection  40 mg Intravenous Q12H   [MAR Hold] metoprolol tartrate  12.5 mg Oral BID   [MAR Hold] mupirocin ointment  1 application Nasal BID   Continuous Infusions:  sodium chloride Stopped (07/14/21 0344)   sodium chloride     heparin 900 Units/hr (07/15/21 0608)   [MAR Hold] norepinephrine (LEVOPHED) Adult infusion Stopped (07/14/21 0141)   [MAR Hold] phenylephrine (NEO-SYNEPHRINE) Adult infusion Stopped (07/15/21 0416)   PRN Meds:.[MAR Hold] docusate sodium, [MAR Hold] metoprolol tartrate, [MAR Hold] polyethylene glycol    Assessment & Plan:  Acute Hypoxic Respiratory Failure secondary to Pulmonary Edema, Underlying Pneumonia and Probable Pulmonary Embolism PMHx: OSA on CPAP -Supplemental O2 as needed to maintain O2 saturations 88 to 92% -BiPAP, wean as tolerated -High risk for intubation -Given elevated d -dimer, high risk score for PE and ? PE on CT will start on Heparin gtt -Will obtain US Bilateral eval for DVT -Follow intermittent ABG and chest x-ray as needed -As needed bronchodilators           -rvp   CICRUCLATORY SHOCK       SUSPECT  CHF decompnesation with cardiogenic shock due to elevated BNP and absence of evidence for infection         -TTE with EF 10%  Sepsis due to suspected Pneumonia-RULED OUT         THERE IS ABSENCE OF LEUKOCYSTOSIS AND PROCAL IS NEGATIVE -Supplemental oxygen as needed, to maintain SpO2 > 90% -F/u cultures, trend lactic/ PCT -Monitor WBC/ fever curve -Continue abx with ceftriaxone and Azithromycin pending cultures -Repeat blood cultures until clear -Pressors for MAP goal >65 -Strict I/O's    Bilateral pleural  effusions    Right is moderate - possible empyema due to findings consisntent with pna  New onset AFib+RVR Mild Elevated Troponin likely demand ischemia in the setting of Afib/?PE -Serial EKGs -Trend Troponins -TSH, FT4 -TTEcho -Low dose metoprolol for rate control -continue Heparin as above -Cardiology Consult  Acute Onset CHF Hypertension Hx: CAD, HLD  -Continuous cardiac monitoring -Maintain MAP greater than 65 -Hold BP meds in the setting of hypotension -IV Lasix as blood pressure and renal function permits; currently on Lasix 20 mg IV BID -Repeat 2D Echocardiogram -Cardiology Consult   Acute Kidney Injury -Monitor I&O's / urinary output -Follow BMP -Ensure adequate renal perfusion -Avoid nephrotoxic agents as able -Replace electrolytes as indicated   Diabetes mellitus -CBGs -Sliding scale insulin -Follow ICU hyper/hypoglycemia protocol -Hold home Amaryl   Hypothyroidism Check TSH, Free T4 -On synthroid  Best practice:  Diet:  NPO Pain/Anxiety/Delirium protocol (if indicated): No VAP protocol (if indicated): Not indicated DVT prophylaxis: Systemic AC GI prophylaxis: N/A Glucose control:  SSI Yes Central venous access:  N/A Arterial line:  N/A Foley:  Yes, and it is still needed Mobility:  bed rest  PT consulted: N/A Last date of multidisciplinary goals of care discussion [1/4] Code Status:  full code Disposition: ICU   = Goals of Care = Code Status Order:  FULL  Primary Emergency Contact: Richardson,Robey Wishes to pursue full aggressive treatment and intervention options, including CPR and intubation, but goals of care will be addressed on going with family if that should become necessary.   Critical care provider statement:   Total critical care time: 33 minutes  Performed by: Lanney Gins MD   Critical care time was exclusive of separately billable procedures and treating other patients.   Critical care was necessary to treat or prevent  imminent or life-threatening deterioration.   Critical care was time spent personally by me on the following activities: development of treatment plan with patient and/or surrogate as well as nursing, discussions with consultants, evaluation of patient's response to treatment, examination of patient, obtaining history from patient or surrogate, ordering and performing treatments and interventions, ordering and review of laboratory studies, ordering and review of radiographic studies, pulse oximetry and re-evaluation of patient's condition.    Ottie Glazier, M.D.  Pulmonary & Critical Care Medicine

## 2021-07-15 NOTE — Progress Notes (Signed)
Peripherally Inserted Central Catheter Placement  The IV Nurse has discussed with the patient and/or persons authorized to consent for the patient, the purpose of this procedure and the potential benefits and risks involved with this procedure.  The benefits include less needle sticks, lab draws from the catheter, and the patient may be discharged home with the catheter. Risks include, but not limited to, infection, bleeding, blood clot (thrombus formation), and puncture of an artery; nerve damage and irregular heartbeat and possibility to perform a PICC exchange if needed/ordered by physician.  Alternatives to this procedure were also discussed.  Bard Power PICC patient education guide, fact sheet on infection prevention and patient information card has been provided to patient /or left at bedside.    PICC Placement Documentation  PICC Double Lumen 07/15/21 PICC Left Brachial 42 cm 0 cm (Active)  Indication for Insertion or Continuance of Line Chronic illness with exacerbations (CF, Sickle Cell, etc.) 07/15/21 1600  Exposed Catheter (cm) 0 cm 07/15/21 1600  Site Assessment Clean;Dry;Intact 07/15/21 1600  Lumen #1 Status Flushed;Saline locked;Blood return noted 07/15/21 1600  Lumen #2 Status Flushed;Saline locked;Blood return noted 07/15/21 1600  Dressing Type Transparent;Securing device 07/15/21 1600  Dressing Status Clean;Dry;Intact 07/15/21 1600  Antimicrobial disc in place? Yes 07/15/21 1600  Safety Lock Not Applicable 79/48/01 6553  Line Care Connections checked and tightened 07/15/21 1600  Dressing Intervention New dressing 07/15/21 1600  Dressing Change Due 07/22/21 07/15/21 1600       Holley Bouche Renee 07/15/2021, 4:32 PM

## 2021-07-15 NOTE — Progress Notes (Signed)
OT Cancellation Note  Patient Details Name: Baani Bober MRN: [id number] DOB: Oct 24, 1955   Cancelled Treatment:    Reason Eval/Treat Not Completed: Other (comment). OT order received and chart reviewed. Pt still in cath lab at this time. OT to re-attempt when pt is next available.   Darleen Crocker, MS, OTR/L , CBIS ascom [phone]  07/15/21, 12:13 PM

## 2021-07-15 NOTE — Progress Notes (Signed)
PT Cancellation Note  Patient Details Name: Michele [name] MRN: [id number] DOB: 1955-10-27   Cancelled Treatment:    Reason Eval/Treat Not Completed: Patient not medically ready PT orders received, chart reviewed. Spoke with nurse who reports pt still has TR band s/p surgery & is unable to use UE with recommendations to hold PT evaluation until tomorrow. Will plan to evaluate pt tomorrow as able.  Lavone Nian, PT, DPT 07/15/21, 1:30 PM   [name] 07/15/2021, 1:30 PM

## 2021-07-15 NOTE — Consult Note (Signed)
ANTICOAGULATION CONSULT NOTE - Initial Consult  Pharmacy Consult for heparin infusion Indication: pulmonary embolus  No Known Allergies  Patient Measurements: Height: 5\' 2"  (157.5 cm) Weight: 93.2 kg (205 lb 7.5 oz) IBW/kg (Calculated) : 50.1 Heparin Dosing Weight: 71.6 kg  Vital Signs: Temp: 98.6 F (37 C) (01/06 0400) BP: 102/75 (01/06 0600) Pulse Rate: 81 (01/06 0400)  Labs: Recent Labs    07/13/21 1706 07/13/21 1925 07/14/21 0253 07/14/21 0506 07/14/21 1136 07/14/21 1928 07/15/21 0511  HGB 15.4*  --   --   --   --   --  16.4*  HCT 48.1*  --   --   --   --   --  50.7*  PLT 218  --   --   --   --   --  166  APTT  --   --   --  30  --   --   --   LABPROT  --   --   --  17.2*  --   --   --   INR  --   --   --  1.4*  --   --   --   HEPARINUNFRC  --   --   --   --  0.78* >1.10* 0.79*  CREATININE 1.76*  --  1.80*  --   --   --  1.62*  TROPONINIHS 25* 25*  --   --   --   --   --      Estimated Creatinine Clearance: 36.8 mL/min (A) (by C-G formula based on SCr of 1.62 mg/dL (H)).   Medical History: Past Medical History:  Diagnosis Date   Abscess of breast, right 08/19/2012   Breast cancer ([place]) 07/13/2010   1.5 cm,intermediate grade DCIS, nuclear grade 2, ER 90%, PR 90% treated with wide excision, reexcision to negative margins and MammoSite partial breast radiation.   CHF (congestive heart failure) ([place])    Coronary artery disease 2009   DCIS (ductal carcinoma in situ) of breast, right 2012   Diabetes mellitus ([facility])    Type II   GERD (gastroesophageal reflux disease)    Hyperlipidemia    Hypertension    Ischemic cardiomyopathy    Obesity, unspecified    Personal history of radiation therapy    Sleep apnea    Thyroid disease    hypothyroidism   Umbilical hernia without mention of obstruction or gangrene     Medications:  No prior AC noted  Heparin Dosing Weight: 71.6 kg  Assessment: 66 y.o female with significant PMH of OSA on CPAP, hypothyroidism,  diabetes mellitus, hypertension, CHF, DCIS, neutrophilia, hyperlipidemia, CAD, who presented to the ED with chief complaints of  shortness of breath x1 week with worsening respiratory status. CT concerning for possible PE.  Date Time HL Rate/Comment 1/5 1928 1.1  Supra; 1200 > 1000 un/hr 1/6 0511 0.79 Supra; 1000 > 900 un/hr 1/6 heparin stopped at 0600; plan to resume 900 un/hr at 1700 per cards      Baseline Labs: aPTT - 30s INR - 1.4 Hgb - 15.4>16.4 Plts - 218>166 D-dimer 2.54>2   Goal of Therapy:  Heparin level 0.3-0.7 units/ml Monitor platelets by anticoagulation protocol: Yes   Plan:  On 1/06 @1700 , Plan to restart heparin gtt at previous reduced rate of 900un/hr and check HL at 6hrs after resumption (~2300). CTM CBC daily while on heparin.   Lorna Dibble, PharmD, [place] Day Clinical Pharmacist 07/15/2021,7:45 AM

## 2021-07-16 DIAGNOSIS — I959 Hypotension, unspecified: Secondary | ICD-10-CM | POA: Diagnosis not present

## 2021-07-16 DIAGNOSIS — R570 Cardiogenic shock: Secondary | ICD-10-CM | POA: Diagnosis not present

## 2021-07-16 DIAGNOSIS — I4891 Unspecified atrial fibrillation: Secondary | ICD-10-CM | POA: Diagnosis not present

## 2021-07-16 DIAGNOSIS — I5023 Acute on chronic systolic (congestive) heart failure: Secondary | ICD-10-CM | POA: Diagnosis not present

## 2021-07-16 LAB — GLUCOSE, CAPILLARY
Glucose-Capillary: 216 mg/dL — ABNORMAL HIGH (ref 70–99)
Glucose-Capillary: 223 mg/dL — ABNORMAL HIGH (ref 70–99)
Glucose-Capillary: 262 mg/dL — ABNORMAL HIGH (ref 70–99)
Glucose-Capillary: 267 mg/dL — ABNORMAL HIGH (ref 70–99)
Glucose-Capillary: 273 mg/dL — ABNORMAL HIGH (ref 70–99)
Glucose-Capillary: 277 mg/dL — ABNORMAL HIGH (ref 70–99)
Glucose-Capillary: 280 mg/dL — ABNORMAL HIGH (ref 70–99)

## 2021-07-16 LAB — CBC
HCT: 45.7 % (ref 36.0–46.0)
Hemoglobin: 14.8 g/dL (ref 12.0–15.0)
MCH: 31.4 pg (ref 26.0–34.0)
MCHC: 32.4 g/dL (ref 30.0–36.0)
MCV: 97 fL (ref 80.0–100.0)
Platelets: 205 10*3/uL (ref 150–400)
RBC: 4.71 MIL/uL (ref 3.87–5.11)
RDW: 15.3 % (ref 11.5–15.5)
WBC: 14.7 10*3/uL — ABNORMAL HIGH (ref 4.0–10.5)
nRBC: 2 % — ABNORMAL HIGH (ref 0.0–0.2)

## 2021-07-16 LAB — COOXEMETRY PANEL
Carboxyhemoglobin: 1.5 % (ref 0.5–1.5)
Methemoglobin: 0.3 % (ref 0.0–1.5)
O2 Saturation: 73.3 %
Total hemoglobin: 13.7 g/dL (ref 12.0–16.0)
Total oxygen content: 41 mL/dL

## 2021-07-16 LAB — MAGNESIUM: Magnesium: 1.9 mg/dL (ref 1.7–2.4)

## 2021-07-16 LAB — D-DIMER, QUANTITATIVE: D-Dimer, Quant: 1.6 ug/mL-FEU — ABNORMAL HIGH (ref 0.00–0.50)

## 2021-07-16 LAB — HEPARIN LEVEL (UNFRACTIONATED)
Heparin Unfractionated: 0.25 IU/mL — ABNORMAL LOW (ref 0.30–0.70)
Heparin Unfractionated: 0.43 IU/mL (ref 0.30–0.70)
Heparin Unfractionated: 0.49 IU/mL (ref 0.30–0.70)

## 2021-07-16 LAB — PHOSPHORUS: Phosphorus: 3.9 mg/dL (ref 2.5–4.6)

## 2021-07-16 MED ORDER — HEPARIN BOLUS VIA INFUSION
1000.0000 [IU] | Freq: Once | INTRAVENOUS | Status: AC
  Administered 2021-07-16: 1000 [IU] via INTRAVENOUS
  Filled 2021-07-16: qty 1000

## 2021-07-16 MED ORDER — INSULIN ASPART 100 UNIT/ML IJ SOLN
0.0000 [IU] | INTRAMUSCULAR | Status: DC
  Administered 2021-07-16: 11 [IU] via SUBCUTANEOUS
  Administered 2021-07-16 (×2): 7 [IU] via SUBCUTANEOUS
  Administered 2021-07-16 (×3): 11 [IU] via SUBCUTANEOUS
  Administered 2021-07-17: 7 [IU] via SUBCUTANEOUS
  Administered 2021-07-17: 15 [IU] via SUBCUTANEOUS
  Administered 2021-07-17: 4 [IU] via SUBCUTANEOUS
  Filled 2021-07-16 (×9): qty 1

## 2021-07-16 NOTE — Progress Notes (Signed)
Progress Note  [name] Name: Michele [name] Date of Encounter: 07/16/2021  [facility] Cardiologist: Michele [name] in Michele fibrillation.  Continued shortness of breath at rest, though improved.  No recurrent chest pain.   Inpatient Medications    Scheduled Meds:  budesonide (PULMICORT) nebulizer solution  0.25 mg Nebulization BID   Chlorhexidine Gluconate Cloth  6 each Topical Daily   furosemide  40 mg Intravenous Q12H   insulin aspart  0-20 Units Subcutaneous Q4H   levothyroxine  200 mcg Oral Q0600   methylPREDNISolone (SOLU-MEDROL) injection  40 mg Intravenous Q12H   mupirocin ointment  1 application Nasal BID   rosuvastatin  10 mg Oral Daily   sodium chloride flush  10-40 mL Intracatheter Q12H   sodium chloride flush  3 mL Intravenous Q12H   Continuous Infusions:  sodium chloride Stopped (07/14/21 0344)   sodium chloride     sodium chloride     [name] 30 mg/hr (07/16/21 0643)   heparin 950 Units/hr (07/16/21 1117)   milrinone 0.25 mcg/kg/min (07/16/21 0918)   norepinephrine (LEVOPHED) Adult infusion 4 mcg/min (07/16/21 0602)   PRN Meds: sodium chloride, acetaminophen, docusate sodium, ondansetron (ZOFRAN) IV, polyethylene glycol, sodium chloride flush, sodium chloride flush   Vital Signs    Vitals:   07/16/21 0400 07/16/21 0500 07/16/21 0600 07/16/21 0853  BP: 101/79 120/90 96/66   Pulse: 85 82 94   Resp: 20 17 20    Temp: 98 F (36.7 C)     TempSrc:      SpO2: 96% 98% 98% 99%  Weight:  96.3 kg    Height:        Intake/Output Summary (Last 24 hours) at 07/16/2021 1233 Last data filed at 07/16/2021 0957 Gross per 24 hour  Intake 878.04 ml  Output 825 ml  Net 53.04 ml    Last 3 Weights 07/16/2021 07/15/2021 07/15/2021  Weight (lbs) 212 lb 4.9 oz 205 lb 7.5 oz 205 lb 7.5 oz  Weight (kg) 96.3 kg 93.2 kg 93.2 kg      Telemetry    Michele fibrillation, rate 90-1 20-personally reviewed   ECG     - Personally Reviewed  Physical Exam    GEN: Well nourished, well developed, in no acute distress  HEENT: normal  Neck: + JVD, carotid bruits, or masses Cardiac: irregular; no murmurs, rubs, or gallops,no edema  Respiratory:  clear to auscultation bilaterally, normal work of breathing GI: soft, nontender, nondistended, + BS MS: no deformity or atrophy  Skin: warm and dry Neuro:  Strength and sensation are intact Psych: euthymic mood, full affect     Labs    High Sensitivity Troponin:   Recent Labs  Lab 07/13/21 1706 07/13/21 1925  TROPONINIHS 25* 25*      Chemistry Recent Labs  Lab 07/13/21 1706 07/14/21 0253 07/14/21 1136 07/15/21 0511 07/16/21 0401  NA 134* 136  --  138  --   K 3.9 4.0  --  3.9  --   CL 102 106  --  108  --   CO2 19* 17*  --  17*  --   GLUCOSE 298* 202*  --  64*  --   BUN 43* 42*  --  45*  --   CREATININE 1.76* 1.80*  --  1.62*  --   CALCIUM 8.4* 7.9*  --  8.2*  --   MG  --  2.2  --  2.2 1.9  PROT  --   --  6.3*  --   --  GFRNONAA 32* 31*  --  35*  --   ANIONGAP 13 13  --  13  --      Lipids No results for input(s): CHOL, TRIG, HDL, LABVLDL, LDLCALC, CHOLHDL in Michele last 168 hours.  Hematology Recent Labs  Lab 07/13/21 1706 07/15/21 0511 07/16/21 0401  WBC 10.0 10.0 14.7*  RBC 4.79 5.14* 4.71  HGB 15.4* 16.4* 14.8  HCT 48.1* 50.7* 45.7  MCV 100.4* 98.6 97.0  MCH 32.2 31.9 31.4  MCHC 32.0 32.3 32.4  RDW 15.5 15.8* 15.3  PLT 218 166 205    Thyroid  Recent Labs  Lab 07/13/21 1925  TSH 8.127*  FREET4 0.65     BNP Recent Labs  Lab 07/13/21 1706  BNP 738.3*     DDimer  Recent Labs  Lab 07/13/21 1706 07/15/21 0511 07/16/21 0401  DDIMER 2.54* 2.00* 1.60*      Radiology    CARDIAC CATHETERIZATION  Result Date: 07/15/2021   Mid LAD lesion [name] 70% stenosed.   2nd Diag lesion [name] 40% stenosed.   Prox RCA lesion [name] 40% stenosed. 1.  Significant one-vessel coronary artery disease with 70% stenosis in Michele mid LAD.  Sluggish TIMI II flow in all coronary arteries  likely due to severely reduced cardiac output. 2.  Left ventricular angiography was not performed due to acute kidney injury.  EF was severely reduced by echo. 3.  Right heart catheterization showed moderately elevated filling pressures, mild pulmonary hypertension and severely reduced cardiac output.  Hemodynamics are consistent with cardiogenic shock with a cardiac output of 2.19 and cardiac index of 1.13. Recommendations: Michele [name] as Michele stenosis in Michele LAD does not explain degree of decreased ejection fraction. Michele [name] volume overloaded with critically low cardiac output.  Michele [name] on milrinone drip and start IV diuresis.  Michele [name]. Michele [name] for rate control with plans to proceed with TEE guided cardioversion early next week.   DG Chest Port 1 View  Result Date: 07/15/2021 CLINICAL DATA:  status post PICC line placement EXAM: PORTABLE CHEST 1 VIEW COMPARISON:  Chest x-ray 07/15/2021, CT chest 07/13/2021 FINDINGS: Left PICC with tip overlying Michele right atrium. Enlarged cardiac silhouette. Michele heart and mediastinal contours are unchanged. Prominent hilar vasculature. No focal consolidation. Increased interstitial markings. Persistent trace to small volume left and trace right pleural effusions. No pneumothorax. No acute osseous abnormality. IMPRESSION: 1. Left PICC with tip overlying Michele right atrium. 2. Likely pulmonary edema with persistent trace to small volume left and trace right pleural effusions. Superimposed infection/inflammation not excluded. Followup PA and lateral chest X-ray [name] recommended in 3-4 weeks following therapy to ensure resolution and exclude underlying malignancy. Electronically Signed   By: Iven Finn M.D.   On: 07/15/2021 16:57   DG Chest Port 1 View  Result Date: 07/15/2021 CLINICAL DATA:  Dyspnea. EXAM: PORTABLE CHEST 1 VIEW  COMPARISON:  July 13, 2021. FINDINGS: Stable cardiomegaly. Increased right basilar opacity [name] noted concerning for worsening edema or pneumonia. Stable left basilar opacity [name] noted as well. Bony thorax [name] unremarkable. IMPRESSION: Stable left basilar opacity [name] noted. Increased right basilar opacity [name] noted concerning for worsening edema or pneumonia. Electronically Signed   By: Marijo Conception M.D.   On: 07/15/2021 13:18   [place] EKG Site Rite  Result Date: 07/15/2021 If Site Rite image not attached, placement could not be  confirmed due to current cardiac rhythm.   Cardiac Studies   LHC 1.  Significant one-vessel coronary artery disease with 70% stenosis in Michele mid LAD.  Sluggish TIMI II flow in all coronary arteries likely due to severely reduced cardiac output. 2.  Left ventricular angiography was not performed due to acute kidney injury.  EF was severely reduced by echo. 3.  Right heart catheterization showed moderately elevated filling pressures, mild pulmonary hypertension and severely reduced cardiac output.  Hemodynamics are consistent with cardiogenic shock with a cardiac output of 2.19 and cardiac index of 1.13.  TTE  1. Left ventricular ejection fraction, by estimation, [name] <20%. Michele left  ventricle has severely decreased function. Michele left ventricle demonstrates  global hypokinesis , inferior and inferolateral wall motion best  preserved. Michele left ventricular internal  cavity size was moderately dilated. Left ventricular diastolic parameters  are indeterminate.   2. Right ventricular systolic function [name] moderately reduced. Michele right  ventricular size [name] normal. There [name] normal pulmonary artery systolic  pressure.   3. Left Michele size was severely dilated.   4. Right Michele size was mildly dilated.   5. A small pericardial effusion [name] present.   6. Michele mitral valve [name] normal in structure. Mild to moderate mitral valve  regurgitation. No evidence of mitral stenosis.   7. Michele  inferior vena cava [name] normal in size with greater than 50%  respiratory variability, suggesting right Michele pressure of 3 mmHg.   [name] Profile     66 y.o. female with previous history of nonischemic [name] with subsequent improvement in ejection fraction, prolonged history of diabetes mellitus, hyperlipidemia and obstructive sleep apnea who presented with newly diagnosed A. fib with RVR and acute on chronic systolic heart failure.  Assessment & Plan    1.  Acute on chronic systolic heart failure with cardiogenic shock in Michele setting of Michele fibrillation with rapid ventricular response: Ejection fraction 10 to 15%.  [name] currently on norepinephrine drip.  Hopefully Michele [name] Zariyah Stephens continue to improve and Michele [name].  Per Michele nurse, Michele [name] net -1 L, though Michele has not been updated in Michele chart.  Michele Cambryn Charters continue with IV diuresis.  2.  Michele fibrillation with rapid ventricular response: Hypotensive and thus [name] [name] not Michele best medication.  Michele Criselda Starke continue with IV [name].  Lenoard Helbert need TEE guided cardioversion next week.  Continue IV heparin.  3.  Acute on chronic CKD: Creatinine has continued to improve.  Michele Jonathandavid Marlett need to recheck tomorrow morning.      For questions or updates, please contact [place] Please consult [email or web address] for contact info under        Signed, Angelyna Henderson Meredith Leeds, MD  07/16/2021, 12:33 PM

## 2021-07-16 NOTE — Consult Note (Addendum)
PHARMACY CONSULT NOTE - FOLLOW UP  Pharmacy Consult for Electrolyte Monitoring and Replacement   Recent Labs: Potassium (mmol/L)  Date Value  07/15/2021 3.9   Magnesium (mg/dL)  Date Value  07/16/2021 1.9   Calcium (mg/dL)  Date Value  07/15/2021 8.2 (L)   Albumin (g/dL)  Date Value  06/22/2021 4.6   Phosphorus (mg/dL)  Date Value  07/16/2021 3.9   Sodium (mmol/L)  Date Value  07/15/2021 138  06/22/2021 138     Assessment: 66 y.o female with significant PMH of OSA on CPAP, hypothyroidism, DM, HTN, CHF, DCIS, neutrophilia, HLD, CAD, who presented to the ED with chief complaints of  SOB x1 week, with worsening respiratory status. CT concerning for possible PE. Pharmacy consulted for mgmt of electrolytes and renal adjustment monitoring.  On lasix 40 mg IV BID   Goal of Therapy:  Lytes WNL  Plan:  NO replacement required at this time.  F/u with AM labs.   Eleonore Chiquito, PharmD,  Clinical Pharmacist 07/16/2021 8:41 AM

## 2021-07-16 NOTE — Consult Note (Signed)
ANTICOAGULATION CONSULT NOTE - Follow- UP  Pharmacy Consult for heparin infusion Indication: pulmonary embolus  No Known Allergies  Patient Measurements: Height: 5\' 2"  (157.5 cm) Weight: 93.2 kg (205 lb 7.5 oz) IBW/kg (Calculated) : 50.1 Heparin Dosing Weight: 71.6 kg  Vital Signs: Temp: 97.5 F (36.4 C) (01/06 2000) Temp Source: Oral (01/06 2000) BP: 99/76 (01/06 2300) Pulse Rate: 123 (01/06 2300)  Labs: Recent Labs    07/13/21 1706 07/13/21 1925 07/14/21 0253 07/14/21 0506 07/14/21 1136 07/14/21 1928 07/15/21 0511 07/15/21 2359  HGB 15.4*  --   --   --   --   --  16.4*  --   HCT 48.1*  --   --   --   --   --  50.7*  --   PLT 218  --   --   --   --   --  166  --   APTT  --   --   --  30  --   --   --   --   LABPROT  --   --   --  17.2*  --   --   --   --   INR  --   --   --  1.4*  --   --   --   --   HEPARINUNFRC  --   --   --   --    < > >1.10* 0.79* 0.25*  CREATININE 1.76*  --  1.80*  --   --   --  1.62*  --   TROPONINIHS 25* 25*  --   --   --   --   --   --    < > = values in this interval not displayed.     Estimated Creatinine Clearance: 36.8 mL/min (A) (by C-G formula based on SCr of 1.62 mg/dL (H)).   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   CHF (congestive heart failure) ([place])    Coronary artery disease 2009   DCIS (ductal carcinoma in situ) of breast, right 2012   Diabetes mellitus ([facility])    Type II   GERD (gastroesophageal reflux disease)    Hyperlipidemia    Hypertension    Ischemic cardiomyopathy    Obesity, unspecified    Personal history of radiation therapy    Sleep apnea    Thyroid disease    hypothyroidism   Umbilical hernia without mention of obstruction or gangrene     Medications:   No prior AC noted  Heparin Dosing Weight: 71.6 kg  Assessment: 66 y.o female with significant PMH of OSA on CPAP, hypothyroidism, diabetes mellitus, hypertension, CHF, DCIS, neutrophilia, hyperlipidemia, CAD, who presented to the ED with chief complaints of  shortness of breath x1 week with worsening respiratory status. CT concerning for possible PE.  Date Time HL Rate/Comment 1/5 1928 1.1  Supra; 1200 > 1000 un/hr 1/6 0511 0.79 Supra; 1000 > 900 un/hr 1/6 heparin stopped at 0600; plan to resume 900 un/hr at 1700 per cards  1/6 2359 0.25 Sub 900> 950     Baseline Labs: aPTT - 30s INR - 1.4 Hgb - 15.4>16.4 Plts - 218>166 D-dimer 2.54>2   Goal of Therapy:  Heparin level 0.3-0.7 units/ml Monitor platelets by  anticoagulation protocol: Yes   Plan:  Heparin subtherapeutic following restart (~ 6 hours) Will give 1000 unit bolus x1  Increase heparin infusion slightly to 950 un/hr Check HL at 6hrs after rate change CTM CBC daily while on heparin.   Dorothe Pea, PharmD, BCPS Clinical Pharmacist   07/16/2021,12:28 AM

## 2021-07-16 NOTE — Progress Notes (Signed)
PT Cancellation Note  Patient Details Name: Michele [name] MRN: [id number] DOB: 05-Nov-1955   Cancelled Treatment:    Reason Eval/Treat Not Completed: Patient not medically ready PT orders received, chart reviewed. Pt new to amiodarone drip & MD requests PT wait the full 24 hrs after initiation of medication before starting therapy. Full 24 hrs will be completed at 2:59pm on 07/16/21. Will f/u as able.  Lavone Nian, PT, DPT 07/16/21, 9:13 AM    [name] 07/16/2021, 9:12 AM

## 2021-07-16 NOTE — Progress Notes (Signed)
NAME:  Michele [name], MRN:  [id number], DOB:  11-Feb-1956, LOS: 3 ADMISSION DATE:  07/13/2021, CONSULTATION DATE:  07/13/2021 REFERRING MD:  Nance Pear, MD  CHIEF COMPLAINT:  SOB    HPI  66 y.o female with significant PMH of OSA on CPAP, hypothyroidism, diabetes mellitus, hypertension, CHF, DCIS, neutrophilia, hyperlipidemia, CAD, who presented to the ED with chief complaints of  shortness of breath x1 week  ED Course: On arrival to the ED, she was afebrile with initial blood pressure 100/57 mm Hg and pulse rate146  beats/min, RR 22 sats 100% on RA.Marland Kitchen There were no focal neurological deficits; patient was alert oriented x4.  Chest x-ray was obtained showed cardiac enlargement with mild vascular congestion and mild left lower lobe atelectasis or infiltrate.  CTA angio chest showed incomplete opacification of segmental branches of the right lower lobe pulmonary artery, PE could not be excluded.  Cardiomegaly with scattered groundglass opacities and small bilateral effusion consistent with mild congestive heart failure. Pertinent Labs in Red/Diagnostics   07/15/21-  patient for cardiac cath. PT/OT after this and swallow evaluation. 07/16/20- patient s/p procedure , she is improved. She remains on milrinone,amiodarone and levophed drips. Clinically reports no chest discomfort.   Past Medical History  OSA on CPAP, hypothyroidism, diabetes mellitus, hypertension, CHF, DCIS, neutrophilia, hyperlipidemia, CAD,  Significant Hospital Events   1/4: Admitted to ICU with acute hypoxic respiratory failure  Consults:  Vascular  Procedures:  None  Significant Diagnostic Tests:  1/4: Chest Xray>Cardiac enlargement with mild vascular congestion Mild left lower lobe atelectasis or infiltrate 1/4: CTA Chest>1. Incomplete opacification of segmental branches of the right lower lobe pulmonary artery. Favor flow related phenomenon and mixing artifact over nonocclusive thrombus. However, PE cannot be  excluded. 2. Cardiomegaly, with scattered ground-glass opacities and small bilateral effusions consistent with mild congestive heart failure.  Micro Data:  1/4: SARS-CoV-2 PCR> negative 1/4: Influenza PCR> negative 1/5: Blood culture x2> 1/5: Urine Culture> 1/4: MRSA PCR>>  1/4: Strep pneumo urinary antigen> 1/4: Legionella urinary antigen>  Antimicrobials:  Azithromycin 1/4> Ceftriaxone 1/4>  OBJECTIVE  Blood pressure (!) 93/56, pulse (!) 58, temperature 98.7 F (37.1 C), temperature source Oral, resp. rate 20, height _0  (1.575 m), weight 96.3 kg, SpO2 100 %.        Intake/Output Summary (Last 24 hours) at 07/16/2021 1646 Last data filed at 07/16/2021 1400 Gross per 24 hour  Intake 1422.89 ml  Output 825 ml  Net 597.89 ml    Filed Weights   07/15/21 0414 07/15/21 0946 07/16/21 0500  Weight: 93.2 kg 93.2 kg 96.3 kg     Physical Examination  GENERAL: 66 year-old critically ill patient lying in the bed with no acute distress on BiPAP EYES: Pupils equal, round, reactive to light and accommodation. No scleral icterus. Extraocular muscles intact.  HEENT: Head atraumatic, normocephalic. Oropharynx and nasopharynx clear.  NECK:  Supple, no jugular venous distention. No thyroid enlargement, no tenderness.  LUNGS: Normal breath sounds bilaterally, no wheezing, rales,rhonchi or crepitation. No use of accessory muscles of respiration.  CARDIOVASCULAR: S1, S2 normal. No murmurs, rubs, or gallops.  ABDOMEN: Soft, nontender, nondistended. Bowel sounds present. No organomegaly or mass.  EXTREMITIES: No pedal edema, cyanosis, or clubbing.  NEUROLOGIC: Cranial nerves II through XII are intact.  Muscle strength 5/5 in all extremities. Sensation intact. Gait not checked.  PSYCHIATRIC: The patient is on BIPAP SKIN: No obvious rash, lesion, or ulcer.   Labs/imaging that I havepersonally reviewed  (right click and "Reselect all  SmartList Selections" daily)     Labs   CBC: Recent Labs   Lab 07/13/21 1706 07/15/21 0511 07/16/21 0401  WBC 10.0 10.0 14.7*  HGB 15.4* 16.4* 14.8  HCT 48.1* 50.7* 45.7  MCV 100.4* 98.6 97.0  PLT 218 166 205     Basic Metabolic Panel: Recent Labs  Lab 07/13/21 1706 07/14/21 0253 07/15/21 0511 07/16/21 0401  NA 134* 136 138  --   K 3.9 4.0 3.9  --   CL 102 106 108  --   CO2 19* 17* 17*  --   GLUCOSE 298* 202* 64*  --   BUN 43* 42* 45*  --   CREATININE 1.76* 1.80* 1.62*  --   CALCIUM 8.4* 7.9* 8.2*  --   MG  --  2.2 2.2 1.9  PHOS  --  3.6 4.2 3.9    GFR: Estimated Creatinine Clearance: 37.5 mL/min (A) (by C-G formula based on SCr of 1.62 mg/dL (H)). Recent Labs  Lab 07/13/21 1706 07/13/21 1925 07/14/21 0003 07/14/21 0253 07/15/21 0511 07/16/21 0401  PROCALCITON  --  <0.10  --  0.18 0.33  --   WBC 10.0  --   --   --  10.0 14.7*  LATICACIDVEN  --   --  3.9* 4.2*  --   --      Liver Function Tests: Recent Labs  Lab 07/14/21 1136  PROT 6.3*    No results for input(s): LIPASE, AMYLASE in the last 168 hours. No results for input(s): AMMONIA in the last 168 hours.  ABG    Component Value Date/Time   O2SAT 73.3 07/16/2021 0401     Coagulation Profile: Recent Labs  Lab 07/14/21 0506  INR 1.4*     Cardiac Enzymes: No results for input(s): CKTOTAL, CKMB, CKMBINDEX, TROPONINI in the last 168 hours.  HbA1C: Hemoglobin A1C  Date/Time Value Ref Range Status  06/22/2021 02:08 PM 10.0 (A) 4.0 - 5.6 % Final  07/30/2018 09:13 AM 9.9 (A) 4.0 - 5.6 % Final   Hgb A1c MFr Bld  Date/Time Value Ref Range Status  11/19/2019 11:43 AM 9.8 (H) 4.8 - 5.6 % Final    Comment:             Prediabetes: 5.7 - 6.4          Diabetes: >6.4          Glycemic control for adults with diabetes: <7.0   03/18/2018 03:53 PM 9.3 (H) 4.8 - 5.6 % Final    Comment:             Prediabetes: 5.7 - 6.4          Diabetes: >6.4          Glycemic control for adults with diabetes: <7.0     CBG: Recent Labs  Lab 07/15/21 2352  07/16/21 0357 07/16/21 0737 07/16/21 1113 07/16/21 1623  GLUCAP 267* 277* 216* 280* 262*     Review of Systems:   Unable to obtain patient is on BiPAP  Past Medical History  She,  has a past medical history of Abscess of breast, right (08/19/2012), Breast cancer ([place]) (07/13/2010), CHF (congestive heart failure) ([place]), Coronary artery disease (2009), DCIS (ductal carcinoma in situ) of breast, right (2012), Diabetes mellitus ([place]), GERD (gastroesophageal reflux disease), Hyperlipidemia, Hypertension, Ischemic cardiomyopathy, Obesity, unspecified, Personal history of radiation therapy, Sleep apnea, Thyroid disease, and Umbilical hernia without mention of obstruction or gangrene.   Surgical History    Past Surgical History:  Procedure Laterality Date   BREAST BIOPSY Right 2007   right bx neg   BREAST BIOPSY Right 08/10/2016   fat necrosis/ done in Dr. Dwyane Luo office   BREAST EXCISIONAL BIOPSY Right 2011   DCIS mammosite lumpectomy   BREAST LUMPECTOMY Right Jan 2012   Wide excision   BREAST MASS EXCISION Right 2011   December   CARDIAC CATHETERIZATION  01/08/2008   CHOLECYSTECTOMY  2013   [facility]   HERNIA REPAIR  2013   epigastric   INCISION AND DRAINAGE BREAST ABSCESS Right 08/19/2012   TONSILLECTOMY AND ADENOIDECTOMY     age 89 yrs     Social History   reports that she has never smoked. She has never used smokeless tobacco. She reports that she does not drink alcohol and does not use drugs.   Family History   Her family history includes Arthritis in her father; CVA in her mother; Congestive Heart Failure in her mother; Diabetes in her mother and sister; Heart attack in her father, maternal grandfather, and paternal grandmother; Heart failure in her mother; Hemochromatosis in her brother; Hypertension in her brother, brother, and sister; Lung cancer in her sister; Stroke in her maternal grandmother. There is no history of Breast cancer.    Allergies No Known Allergies   Home Medications  Prior to Admission medications   Medication Sig Start Date End Date Taking? Authorizing Provider  allopurinol (ZYLOPRIM) 300 MG tablet Take 1 tablet (300 mg total) by mouth daily. 11/19/19   Jerrol Banana., MD  amLODipine (NORVASC) 10 MG tablet TAKE 1 TABLET BY MOUTH  DAILY 04/16/19   Jerrol Banana., MD  Blood Glucose Monitoring Suppl (ONE TOUCH ULTRA SYSTEM KIT) w/Device KIT Check sugar once daily. DX E11.9 10/18/16   Jerrol Banana., MD  BYSTOLIC 20 MG TABS TAKE 1 TABLET BY MOUTH  DAILY 05/19/20   Jerrol Banana., MD  cetirizine (ZYRTEC) 10 MG tablet Take 1 tablet (10 mg total) by mouth daily. 03/18/18   Jerrol Banana., MD  fluticasone [facility]) 50 MCG/ACT nasal spray Place into the nose. 02/24/13   [provider]  glimepiride (AMARYL) 4 MG tablet TAKE 1 TABLET BY MOUTH  TWICE DAILY 02/25/20   Jerrol Banana., MD  Glucagon (GVOKE HYPOPEN 1-PACK) 1 MG/0.2ML SOAJ Inject 1 mg into the skin every 15 (fifteen) minutes as needed for up to 5 doses. 06/22/21   Myles Gip, DO  glucose blood (ONE TOUCH ULTRA TEST) test strip Use as instructed 12/10/17   Jerrol Banana., MD  insulin glargine (LANTUS SOLOSTAR) 100 UNIT/ML Solostar Pen INJECT 50 UNITS UNDER THE SKIN TWICE DAILY 12/31/19   Jerrol Banana., MD  Insulin Pen Needle 32G X 4 MM MISC Inject twice daily, SQ. DX E11.9 11/19/19   Jerrol Banana., MD  Lancets [facility] ULTRASOFT) lancets Check sugar once daily DX E11.9 12/10/17   Jerrol Banana., MD  levothyroxine (SYNTHROID) 200 MCG tablet TAKE 1 TABLET(200 MCG) BY MOUTH DAILY BEFORE BREAKFAST 06/22/21   Myles Gip, DO  losartan (COZAAR) 100 MG tablet TAKE 1 TABLET BY MOUTH  DAILY 02/06/20   Jerrol Banana., MD  NON FORMULARY CPAP AT BEDTIME    [provider]  rosuvastatin (CRESTOR) 10 MG tablet TAKE 1 TABLET BY MOUTH  DAILY 11/28/19   Minna Merritts, MD  Syringe/Needle, Disp, (SYRINGE 3CC/25GX1") 25G X  1" 3 ML MISC 1 mL by Does not apply route every 30 (thirty) days. 05/03/16   Jerrol Banana., MD  Scheduled Meds:  budesonide (PULMICORT) nebulizer solution  0.25 mg Nebulization BID   Chlorhexidine Gluconate Cloth  6 each Topical Daily   furosemide  40 mg Intravenous Q12H   insulin aspart  0-20 Units Subcutaneous Q4H   levothyroxine  200 mcg Oral Q0600   methylPREDNISolone (SOLU-MEDROL) injection  40 mg Intravenous Q12H   mupirocin ointment  1 application Nasal BID   rosuvastatin  10 mg Oral Daily   sodium chloride flush  10-40 mL Intracatheter Q12H   sodium chloride flush  3 mL Intravenous Q12H   Continuous Infusions:  sodium chloride Stopped (07/14/21 0344)   sodium chloride     sodium chloride     amiodarone 30 mg/hr (07/16/21 1400)   heparin 950 Units/hr (07/16/21 1400)   milrinone 0.25 mcg/kg/min (07/16/21 1400)   norepinephrine (LEVOPHED) Adult infusion 2 mcg/min (07/16/21 1400)   PRN Meds:.sodium chloride, acetaminophen, docusate sodium, ondansetron (ZOFRAN) IV, polyethylene glycol, sodium chloride flush, sodium chloride flush    Assessment & Plan:  Acute Hypoxic Respiratory Failure secondary to Pulmonary Edema, Underlying Pneumonia and Probable Pulmonary Embolism PMHx: OSA on CPAP -Supplemental O2 as needed to maintain O2 saturations 88 to 92% -BiPAP, wean as tolerated -High risk for intubation -Given elevated d -dimer, high risk score for PE and ? PE on CT will start on Heparin gtt -Will obtain US Bilateral eval for DVT -Follow intermittent ABG and chest x-ray as needed -As needed bronchodilators           -rvp   CICRUCLATORY SHOCK  CHF decompnesation with cardiogenic shock due to elevated BNP and absence of evidence for infection         -TTE with EF 10%  -cardio on case appreciate recommendations - [facility]  Sepsis due to suspected Pneumonia-RULED OUT         THERE IS ABSENCE OF LEUKOCYSTOSIS  AND PROCAL IS NEGATIVE -Supplemental oxygen as needed, to maintain SpO2 > 90% -F/u cultures, trend lactic/ PCT -Monitor WBC/ fever curve -Continue abx with ceftriaxone and Azithromycin pending cultures -Repeat blood cultures until clear -Pressors for MAP goal >65 -Strict I/O's    Bilateral pleural effusions    Right is moderate - possible empyema due to findings consisntent with pna  New onset AFib+RVR Mild Elevated Troponin likely demand ischemia in the setting of Afib/?PE -Serial EKGs -Trend Troponins -TSH, FT4 -TTEcho -Low dose metoprolol for rate control -continue Heparin as above -Cardiology Consult  Acute Onset CHF Hypertension Hx: CAD, HLD  -Continuous cardiac monitoring -Maintain MAP greater than 65 -Hold BP meds in the setting of hypotension -IV Lasix as blood pressure and renal function permits; currently on Lasix 20 mg IV BID -Repeat 2D Echocardiogram -Cardiology Consult   Acute Kidney Injury -Monitor I&O's / urinary output -Follow BMP -Ensure adequate renal perfusion -Avoid nephrotoxic agents as able -Replace electrolytes as indicated   Diabetes mellitus -CBGs -Sliding scale insulin -Follow ICU hyper/hypoglycemia protocol -Hold home Amaryl   Hypothyroidism Check TSH, Free T4 -On synthroid  Best practice:  Diet:  NPO Pain/Anxiety/Delirium protocol (if indicated): No VAP protocol (if indicated): Not indicated DVT prophylaxis: Systemic AC GI prophylaxis: N/A Glucose control:  SSI Yes Central venous access:  N/A Arterial line:  N/A Foley:  Yes, and it is still needed Mobility:  bed rest  PT consulted: N/A Last date of multidisciplinary goals of care discussion [1/4] Code  Status:  full code Disposition: ICU   = Goals of Care = Code Status Order:  FULL  Primary Emergency Contact: Richardson,Robey Wishes to pursue full aggressive treatment and intervention options, including CPR and intubation, but goals of care will be addressed on going  with family if that should become necessary.   Critical care provider statement:   Total critical care time: 33 minutes   Performed by: Lanney Gins MD   Critical care time was exclusive of separately billable procedures and treating other patients.   Critical care was necessary to treat or prevent imminent or life-threatening deterioration.   Critical care was time spent personally by me on the following activities: development of treatment plan with patient and/or surrogate as well as nursing, discussions with consultants, evaluation of patient's response to treatment, examination of patient, obtaining history from patient or surrogate, ordering and performing treatments and interventions, ordering and review of laboratory studies, ordering and review of radiographic studies, pulse oximetry and re-evaluation of patient's condition.    Ottie Glazier, M.D.  Pulmonary & Critical Care Medicine

## 2021-07-16 NOTE — Progress Notes (Signed)
OT Cancellation Note  Patient Details Name: Michele [name] MRN: [id number] DOB: 11/30/1955   Cancelled Treatment:    Reason Eval/Treat Not Completed: Medical issues which prohibited therapy;Other (comment) (Patient not medically ready. OT orders received, chart reviewed. Pt new to amiodarone drip & MD requests PT wait the full 24 hrs after initiation of medication before starting therapy. Full 24 hrs will be completed at 2:59pm on 07/16/21. Will f/u as able.Shanon Payor, OTD OTR/L  07/16/21, 9:17 AM

## 2021-07-16 NOTE — Consult Note (Signed)
ANTICOAGULATION CONSULT NOTE - Follow- UP  Pharmacy Consult for heparin infusion Indication: pulmonary embolus  No Known Allergies  Patient Measurements: Height: 5\' 2"  (157.5 cm) Weight: 96.3 kg (212 lb 4.9 oz) IBW/kg (Calculated) : 50.1 Heparin Dosing Weight: 71.6 kg  Vital Signs: Temp: 98.7 F (37.1 C) (01/07 0800) Temp Source: Oral (01/07 0800) BP: 93/56 (01/07 1400) Pulse Rate: 58 (01/07 1100)  Labs: Recent Labs    07/13/21 1706 07/13/21 1925 07/14/21 0253 07/14/21 0506 07/14/21 1136 07/15/21 0511 07/15/21 2359 07/16/21 0401 07/16/21 0931 07/16/21 1603  HGB 15.4*  --   --   --   --  16.4*  --  14.8  --   --   HCT 48.1*  --   --   --   --  50.7*  --  45.7  --   --   PLT 218  --   --   --   --  166  --  205  --   --   APTT  --   --   --  30  --   --   --   --   --   --   LABPROT  --   --   --  17.2*  --   --   --   --   --   --   INR  --   --   --  1.4*  --   --   --   --   --   --   HEPARINUNFRC  --   --   --   --    < > 0.79* 0.25*  --  0.43 0.49  CREATININE 1.76*  --  1.80*  --   --  1.62*  --   --   --   --   TROPONINIHS 25* 25*  --   --   --   --   --   --   --   --    < > = values in this interval not displayed.     Estimated Creatinine Clearance: 37.5 mL/min (A) (by C-G formula based on SCr of 1.62 mg/dL (H)).   Medical History: Past Medical History:  Diagnosis Date   Abscess of breast, right 08/19/2012   Breast cancer ([place]) 07/13/2010   1.5 cm,intermediate grade DCIS, nuclear grade 2, ER 90%, PR 90% treated with wide excision, reexcision to negative margins and MammoSite partial breast radiation.   CHF (congestive heart failure) ([place])    Coronary artery disease 2009   DCIS (ductal carcinoma in situ) of breast, right 2012   Diabetes mellitus ([facility])    Type II   GERD (gastroesophageal reflux disease)    Hyperlipidemia    Hypertension    Ischemic cardiomyopathy    Obesity, unspecified    Personal history of radiation therapy    Sleep apnea     Thyroid disease    hypothyroidism   Umbilical hernia without mention of obstruction or gangrene     Medications:  No prior AC noted  Heparin Dosing Weight: 71.6 kg  Assessment: 66 y.o female with significant PMH of OSA on CPAP, hypothyroidism, diabetes mellitus, hypertension, CHF, DCIS, neutrophilia, hyperlipidemia, CAD, who presented to the ED with chief complaints of  shortness of breath x1 week with worsening respiratory status. CT concerning for possible PE.  Date Time HL Rate/Comment 1/5 1928 1.1  Supra; 1200 > 1000 un/hr 1/6 0511 0.79 Supra; 1000 > 900 un/hr 1/6 heparin stopped at  0600; plan to resume 900 un/hr at 1700 per cards  1/6 2359 0.25 Sub 900> 950   1/7 0931  0.43  Therapeutic 1/7 1603 0.49 Therapeutic     Goal of Therapy:  Heparin level 0.3-0.7 units/ml Monitor platelets by anticoagulation protocol: Yes   Plan:  Heparin level is therapeutic. Will continue heparin infusion at 950 units/hr. Recheck heparin level with AM labs. CBC daily while on heparin.   Sherilyn Banker, PharmD, BCPS Clinical Pharmacist   07/16/2021,4:38 PM

## 2021-07-16 NOTE — Consult Note (Signed)
ANTICOAGULATION CONSULT NOTE - Follow- UP  Pharmacy Consult for heparin infusion Indication: pulmonary embolus  No Known Allergies  Patient Measurements: Height: 5\' 2"  (157.5 cm) Weight: 96.3 kg (212 lb 4.9 oz) IBW/kg (Calculated) : 50.1 Heparin Dosing Weight: 71.6 kg  Vital Signs: Temp: 98 F (36.7 C) (01/07 0400) BP: 96/66 (01/07 0600) Pulse Rate: 94 (01/07 0600)  Labs: Recent Labs    07/13/21 1706 07/13/21 1925 07/14/21 0253 07/14/21 0506 07/14/21 1136 07/15/21 0511 07/15/21 2359 07/16/21 0401 07/16/21 0931  HGB 15.4*  --   --   --   --  16.4*  --  14.8  --   HCT 48.1*  --   --   --   --  50.7*  --  45.7  --   PLT 218  --   --   --   --  166  --  205  --   APTT  --   --   --  30  --   --   --   --   --   LABPROT  --   --   --  17.2*  --   --   --   --   --   INR  --   --   --  1.4*  --   --   --   --   --   HEPARINUNFRC  --   --   --   --    < > 0.79* 0.25*  --  0.43  CREATININE 1.76*  --  1.80*  --   --  1.62*  --   --   --   TROPONINIHS 25* 25*  --   --   --   --   --   --   --    < > = values in this interval not displayed.     Estimated Creatinine Clearance: 37.5 mL/min (A) (by C-G formula based on SCr of 1.62 mg/dL (H)).   Medical History: Past Medical History:  Diagnosis Date   Abscess of breast, right 08/19/2012   Breast cancer ([place]) 07/13/2010   1.5 cm,intermediate grade DCIS, nuclear grade 2, ER 90%, PR 90% treated with wide excision, reexcision to negative margins and MammoSite partial breast radiation.   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   GERD (gastroesophageal reflux disease)    Hyperlipidemia    Hypertension    Ischemic cardiomyopathy    Obesity, unspecified    Personal history of radiation therapy    Sleep apnea    Thyroid disease    hypothyroidism   Umbilical hernia without mention of obstruction or  gangrene     Medications:  No prior AC noted  Heparin Dosing Weight: 71.6 kg  Assessment: 66 y.o female with significant PMH of OSA on CPAP, hypothyroidism, diabetes mellitus, hypertension, CHF, DCIS, neutrophilia, hyperlipidemia, CAD, who presented to the ED with chief complaints of  shortness of breath x1 week with worsening respiratory status. CT concerning for possible PE.  Date Time HL Rate/Comment 1/5 1928 1.1  Supra; 1200 > 1000 un/hr 1/6 0511 0.79 Supra; 1000 > 900 un/hr 1/6 heparin stopped at 0600; plan to resume 900 un/hr at 1700 per cards  1/6 2359 0.25 Sub 900> 950   1/7 0931  0.43  Therapeutic.    Goal of Therapy:  Heparin level 0.3-0.7 units/ml Monitor platelets by anticoagulation protocol: Yes   Plan:  Heparin level is therapeutic. Will continue heparin infusion at 950 units/hr. Recheck level in 6 hours. CBC daily while on heparin.   Oswald Hillock, PharmD, BCPS Clinical Pharmacist   07/16/2021,10:22 AM

## 2021-07-16 NOTE — Evaluation (Signed)
Occupational Therapy Evaluation Patient Details Name: Michele [name] MRN: [id number] DOB: 04/01/1956 Today's Date: 07/16/2021   History of Present Illness 66 y.o female with significant PMH of OSA on CPAP, hypothyroidism, diabetes mellitus, hypertension, CHF, DCIS, neutrophilia, hyperlipidemia, CAD, who presented to the ED with chief complaints of  shortness of breath x1 week. Pt being tx'ed for Acute Hypoxic Respiratory Failure secondary to Pulmonary Edema, Underlying Pneumonia and possible PE, started on heparin gtt. Pt s/p cath on 1/6, improved status on 1/7 and approved for therapy evaluation 24h post-start of amiodarone.   Clinical Impression   Pt seen for OT evaluation this date in setting of acute hospitalization d/t respiratory failure. She presents this date on 4L in bed with saturations at 100% and tolerates titrating to 2Lnc well, still maintaining saturations at 100% which is reported to RN. She reports being INDEP at baseline, living alone in [facility] with ramped entrance that is also setup to be handicap accessible as her deceased spouse was paralyzed. She also has access to various types of DME although she does not use them at baseline. She presents this date with slightly decreased fxl activity tolerance and general deconditioning from hospital stay, but is agreeable to participation with OT. She currently requires: SETUP for seated UB ADLs, MOD A for LB ADLs, MIN A/CGA for ADL transfers with RW. Lines and leads managed throughout transfer and RN/pulmonary doctor nearby for entirety of session. Pt tolerates session very well. She is left in recliner end of session with all needs met and in reach and RN aware. Will continue to follow acutely. Anticipate she can d/c home with HHOT f/u and intermittent support/supervision from her friends in the area or her sisters (live in [place], ~40 mins away). She may require temporary aide help for household IADLs such as laundry, cooking and cleaning.      Recommendations for follow up therapy are one component of a multi-disciplinary discharge planning process, led by the attending physician.  Recommendations may be updated based on patient status, additional functional criteria and insurance authorization.   Follow Up Recommendations  Home health OT    Assistance Recommended at Discharge Intermittent Supervision/Assistance  Patient can return home with the following Assistance with cooking/housework;Assist for transportation    Functional Status Assessment  Patient has had a recent decline in their functional status and demonstrates the ability to make significant improvements in function in a reasonable and predictable amount of time.  Equipment Recommendations  None recommended by OT (has all necessary equipment from her deceased spouse)    Recommendations for Other Services       Precautions / Restrictions Precautions Precautions: Fall Restrictions Weight Bearing Restrictions: No      Mobility Bed Mobility Overal bed mobility: Needs Assistance Bed Mobility: Supine to Sit     Supine to sit: Min guard     General bed mobility comments: HOB elevated, increased time, slight assist for trunk    Transfers Overall transfer level: Needs assistance Equipment used: Rolling walker (2 wheels) Transfers: Sit to/from Stand;Bed to chair/wheelchair/BSC Sit to Stand: Min guard Stand pivot transfers: Min guard;Min assist         General transfer comment: line/lead mgt as well as slight assist for steadying. No s/s of dizziness although her SBP is low (MAP is okay, see below)      Balance Overall balance assessment: Needs assistance   Sitting balance-Leahy Scale: Good     Standing balance support: Bilateral upper extremity supported Standing balance-Leahy Scale: Fair  ADL either performed or assessed with clinical judgement   ADL                                          General ADL Comments: SETUP for seated UB ADLs, MOD A for LB ADLs, MIN A/CGA for ADL transfers with RW.     Vision Patient Visual Report: No change from baseline       Perception     Praxis      Pertinent Vitals/Pain Pain Assessment: No/denies pain     Hand Dominance Right   Extremity/Trunk Assessment Upper Extremity Assessment Upper Extremity Assessment: Overall WFL for tasks assessed   Lower Extremity Assessment Lower Extremity Assessment: Generalized weakness       Communication Communication Communication: No difficulties   Cognition Arousal/Alertness: Awake/alert Behavior During Therapy: WFL for tasks assessed/performed Overall Cognitive Status: Within Functional Limits for tasks assessed                                       General Comments  BP in bed 93/70 (78), standing: 92/65 (75) RN aware and okay'ed brief stand-pivot to chair given stable MAPs    Exercises Other Exercises Other Exercises: OT engages pt in ed re: role of OT, importance of OOB activity and SPS transfer to chair with MIN A with RW and cues for safety.   Shoulder Instructions      Home Living Family/patient expects to be discharged to:: Private residence Living Arrangements: Alone Available Help at Discharge: Family;Available PRN/intermittently (sisters 45 mins away) Type of Home: House Home Access: Stairs to enter;Ramped entrance Entrance Stairs-Number of Steps: steps to front, ramp to back Entrance Stairs-Rails: Can reach both Home Layout: One level     Bathroom Shower/Tub: Walk-in shower         Home Equipment: Shower seat - built in;Grab bars - Statistician (2 wheels);Cane - single point;Electric scooter   Additional Comments: most equipment was her deceased husbands      Prior Functioning/Environment Prior Level of Function : Independent/Modified Independent             Mobility Comments: normally INDEP, used walker recent  week or two d/t increasd weakness ADLs Comments: all I/ADLs I'ly        OT Problem List: Decreased strength;Decreased activity tolerance;Cardiopulmonary status limiting activity      OT Treatment/Interventions: Self-care/ADL training;Therapeutic exercise;DME and/or AE instruction;Therapeutic activities;Patient/family education;Balance training    OT Goals(Current goals can be found in the care plan section) Acute Rehab OT Goals Patient Stated Goal: to go home OT Goal Formulation: With patient Time For Goal Achievement: 07/30/21 Potential to Achieve Goals: Good ADL Goals Pt Will Perform Upper Body Bathing: with modified independence Pt Will Perform Lower Body Bathing: with supervision Pt Will Perform Lower Body Dressing: with supervision;sit to/from stand Pt Will Transfer to Toilet: with supervision;ambulating Pt Will Perform Toileting - Clothing Manipulation and hygiene: with modified independence;sitting/lateral leans  OT Frequency: Min 2X/week    Co-evaluation              AM-PAC OT "6 Clicks" Daily Activity     Outcome Measure Help from another person eating meals?: None Help from another person taking care of personal grooming?: A Little Help from another person toileting, which includes using toliet, bedpan, or urinal?:  A Little Help from another person bathing (including washing, rinsing, drying)?: A Little Help from another person to put on and taking off regular upper body clothing?: A Little Help from another person to put on and taking off regular lower body clothing?: A Lot 6 Click Score: 18   End of Session Equipment Utilized During Treatment: Gait belt;Rolling walker (2 wheels) Nurse Communication: Mobility status  Activity Tolerance: Patient tolerated treatment well Patient left: in chair;with call bell/phone within reach;with chair alarm set  OT Visit Diagnosis: Unsteadiness on feet (R26.81);Muscle weakness (generalized) (M62.81)                Time:  5374-8270 OT Time Calculation (min): 42 min Charges:  OT General Charges $OT Visit: 1 Visit OT Evaluation $OT Eval Moderate Complexity: 1 Mod OT Treatments $Self Care/Home Management : 8-22 mins $Therapeutic Activity: 8-22 mins  Gerrianne Scale, MS, OTR/L ascom [phone] 07/16/21, 7:25 PM

## 2021-07-17 DIAGNOSIS — R570 Cardiogenic shock: Secondary | ICD-10-CM | POA: Diagnosis not present

## 2021-07-17 DIAGNOSIS — N179 Acute kidney failure, unspecified: Secondary | ICD-10-CM | POA: Diagnosis not present

## 2021-07-17 DIAGNOSIS — I4891 Unspecified atrial fibrillation: Secondary | ICD-10-CM | POA: Diagnosis not present

## 2021-07-17 DIAGNOSIS — I5023 Acute on chronic systolic (congestive) heart failure: Secondary | ICD-10-CM | POA: Diagnosis not present

## 2021-07-17 LAB — CBC
HCT: 43.9 % (ref 36.0–46.0)
Hemoglobin: 14.6 g/dL (ref 12.0–15.0)
MCH: 31.4 pg (ref 26.0–34.0)
MCHC: 33.3 g/dL (ref 30.0–36.0)
MCV: 94.4 fL (ref 80.0–100.0)
Platelets: 185 10*3/uL (ref 150–400)
RBC: 4.65 MIL/uL (ref 3.87–5.11)
RDW: 15.2 % (ref 11.5–15.5)
WBC: 13.8 10*3/uL — ABNORMAL HIGH (ref 4.0–10.5)
nRBC: 0.7 % — ABNORMAL HIGH (ref 0.0–0.2)

## 2021-07-17 LAB — BASIC METABOLIC PANEL
Anion gap: 13 (ref 5–15)
Anion gap: 12 (ref 5–15)
BUN: 59 mg/dL — ABNORMAL HIGH (ref 8–23)
BUN: 59 mg/dL — ABNORMAL HIGH (ref 8–23)
CO2: 20 mmol/L — ABNORMAL LOW (ref 22–32)
CO2: 21 mmol/L — ABNORMAL LOW (ref 22–32)
Calcium: 7.9 mg/dL — ABNORMAL LOW (ref 8.9–10.3)
Calcium: 7.8 mg/dL — ABNORMAL LOW (ref 8.9–10.3)
Chloride: 100 mmol/L (ref 98–111)
Chloride: 98 mmol/L (ref 98–111)
Creatinine, Ser: 1.69 mg/dL — ABNORMAL HIGH (ref 0.44–1.00)
Creatinine, Ser: 1.75 mg/dL — ABNORMAL HIGH (ref 0.44–1.00)
GFR, Estimated: 33 mL/min — ABNORMAL LOW (ref >60)
GFR, Estimated: 32 mL/min — ABNORMAL LOW (ref >60)
Glucose, Bld: 195 mg/dL — ABNORMAL HIGH (ref 70–99)
Glucose, Bld: 448 mg/dL — ABNORMAL HIGH (ref 70–99)
Potassium: 3.4 mmol/L — ABNORMAL LOW (ref 3.5–5.1)
Potassium: 3.6 mmol/L (ref 3.5–5.1)
Sodium: 133 mmol/L — ABNORMAL LOW (ref 135–145)
Sodium: 131 mmol/L — ABNORMAL LOW (ref 135–145)

## 2021-07-17 LAB — GLUCOSE, CAPILLARY
Glucose-Capillary: 188 mg/dL — ABNORMAL HIGH (ref 70–99)
Glucose-Capillary: 220 mg/dL — ABNORMAL HIGH (ref 70–99)
Glucose-Capillary: 315 mg/dL — ABNORMAL HIGH (ref 70–99)
Glucose-Capillary: 318 mg/dL — ABNORMAL HIGH (ref 70–99)
Glucose-Capillary: 402 mg/dL — ABNORMAL HIGH (ref 70–99)
Glucose-Capillary: 410 mg/dL — ABNORMAL HIGH (ref 70–99)
Glucose-Capillary: 444 mg/dL — ABNORMAL HIGH (ref 70–99)
Glucose-Capillary: 447 mg/dL — ABNORMAL HIGH (ref 70–99)
Glucose-Capillary: 457 mg/dL — ABNORMAL HIGH (ref 70–99)
Glucose-Capillary: 460 mg/dL — ABNORMAL HIGH (ref 70–99)
Glucose-Capillary: 481 mg/dL — ABNORMAL HIGH (ref 70–99)

## 2021-07-17 LAB — HEPARIN LEVEL (UNFRACTIONATED)
Heparin Unfractionated: 0.32 IU/mL (ref 0.30–0.70)
Heparin Unfractionated: 0.33 IU/mL (ref 0.30–0.70)

## 2021-07-17 LAB — PHOSPHORUS: Phosphorus: 2.3 mg/dL — ABNORMAL LOW (ref 2.5–4.6)

## 2021-07-17 LAB — D-DIMER, QUANTITATIVE: D-Dimer, Quant: 3.07 ug/mL-FEU — ABNORMAL HIGH (ref 0.00–0.50)

## 2021-07-17 LAB — MAGNESIUM: Magnesium: 1.9 mg/dL (ref 1.7–2.4)

## 2021-07-17 MED ORDER — POTASSIUM PHOSPHATES 15 MMOLE/5ML IV SOLN
15.0000 mmol | Freq: Once | INTRAVENOUS | Status: AC
  Administered 2021-07-17: 15 mmol via INTRAVENOUS
  Filled 2021-07-17: qty 5

## 2021-07-17 MED ORDER — POTASSIUM CHLORIDE CRYS ER 20 MEQ PO TBCR
20.0000 meq | EXTENDED_RELEASE_TABLET | Freq: Once | ORAL | Status: AC
  Administered 2021-07-17: 20 meq via ORAL
  Filled 2021-07-17: qty 1

## 2021-07-17 MED ORDER — INSULIN REGULAR(HUMAN) IN NACL 100-0.9 UT/100ML-% IV SOLN
INTRAVENOUS | Status: DC
  Administered 2021-07-17: 14 [IU]/h via INTRAVENOUS
  Administered 2021-07-18: 22 [IU]/h via INTRAVENOUS
  Filled 2021-07-17 (×2): qty 100

## 2021-07-17 MED ORDER — DEXTROSE 50 % IV SOLN: 0.0000 mL | INTRAVENOUS | Status: DC | PRN

## 2021-07-17 NOTE — Progress Notes (Signed)
Progress Note  Patient Name: Michele [name] Date of Encounter: 07/17/2021  [facility] Cardiologist: Rockey Situ  Subjective   Patient remains in atrial fibrillation.  Blood pressure has improved.  Now on milrinone and norepinephrine.  I's and O's have been not well documented as she had issues with her pure wick catheter.   Inpatient Medications    Scheduled Meds:  Chlorhexidine Gluconate Cloth  6 each Topical Daily   furosemide  40 mg Intravenous Q12H   insulin aspart  0-20 Units Subcutaneous Q4H   levothyroxine  200 mcg Oral Q0600   methylPREDNISolone (SOLU-MEDROL) injection  40 mg Intravenous Q12H   mupirocin ointment  1 application Nasal BID   potassium chloride  20 mEq Oral Once   rosuvastatin  10 mg Oral Daily   sodium chloride flush  10-40 mL Intracatheter Q12H   Continuous Infusions:  sodium chloride Stopped (07/14/21 0344)   sodium chloride     amiodarone 30 mg/hr (07/17/21 1200)   heparin 1,000 Units/hr (07/17/21 1200)   milrinone 0.25 mcg/kg/min (07/17/21 1200)   norepinephrine (LEVOPHED) Adult infusion 3 mcg/min (07/17/21 1200)   potassium PHOSPHATE IVPB (in mmol)     PRN Meds: acetaminophen, docusate sodium, ondansetron (ZOFRAN) IV, polyethylene glycol, sodium chloride flush   Vital Signs    Vitals:   07/17/21 0700 07/17/21 0800 07/17/21 1030 07/17/21 1200  BP: 90/66 106/69 110/64 104/77  Pulse: 94 (!) 42    Resp: 13 19 16 18   Temp:  98 F (36.7 C)    TempSrc:  Oral  Oral  SpO2: 100% 100%  100%  Weight:      Height:        Intake/Output Summary (Last 24 hours) at 07/17/2021 1232 Last data filed at 07/17/2021 1200 Gross per 24 hour  Intake 1571.16 ml  Output 1100 ml  Net 471.16 ml    Last 3 Weights 07/17/2021 07/16/2021 07/15/2021  Weight (lbs) 212 lb 4.9 oz 212 lb 4.9 oz 205 lb 7.5 oz  Weight (kg) 96.3 kg 96.3 kg 93.2 kg      Telemetry    Atrial fibrillation, rate 90s to 120s-personally reviewed   ECG    None new  Physical Exam    GEN:  Well nourished, well developed, in no acute distress  HEENT: normal  Neck: + JVD, carotid bruits, or masses Cardiac: Irregular, tachycardic; no murmurs, rubs, or gallops,no edema  Respiratory:  clear to auscultation bilaterally, normal work of breathing GI: soft, nontender, nondistended, + BS MS: no deformity or atrophy  Skin: warm and dry Neuro:  Strength and sensation are intact Psych: euthymic mood, full affect     Labs    High Sensitivity Troponin:   Recent Labs  Lab 07/13/21 1706 07/13/21 1925  TROPONINIHS 25* 25*      Chemistry Recent Labs  Lab 07/14/21 0253 07/14/21 1136 07/15/21 0511 07/16/21 0401 07/17/21 0249  NA 136  --  138  --  133*  K 4.0  --  3.9  --  3.4*  CL 106  --  108  --  100  CO2 17*  --  17*  --  20*  GLUCOSE 202*  --  64*  --  195*  BUN 42*  --  45*  --  59*  CREATININE 1.80*  --  1.62*  --  1.69*  CALCIUM 7.9*  --  8.2*  --  7.9*  MG 2.2  --  2.2 1.9 1.9  PROT  --  6.3*  --   --   --  GFRNONAA 31*  --  35*  --  33*  ANIONGAP 13  --  13  --  13     Lipids No results for input(s): CHOL, TRIG, HDL, LABVLDL, LDLCALC, CHOLHDL in the last 168 hours.  Hematology Recent Labs  Lab 07/15/21 0511 07/16/21 0401 07/17/21 0249  WBC 10.0 14.7* 13.8*  RBC 5.14* 4.71 4.65  HGB 16.4* 14.8 14.6  HCT 50.7* 45.7 43.9  MCV 98.6 97.0 94.4  MCH 31.9 31.4 31.4  MCHC 32.3 32.4 33.3  RDW 15.8* 15.3 15.2  PLT 166 205 185    Thyroid  Recent Labs  Lab 07/13/21 1925  TSH 8.127*  FREET4 0.65     BNP Recent Labs  Lab 07/13/21 1706  BNP 738.3*     DDimer  Recent Labs  Lab 07/15/21 0511 07/16/21 0401 07/17/21 0249  DDIMER 2.00* 1.60* 3.07*      Radiology    DG Chest Port 1 View  Result Date: 07/15/2021 CLINICAL DATA:  status post PICC line placement EXAM: PORTABLE CHEST 1 VIEW COMPARISON:  Chest x-ray 07/15/2021, CT chest 07/13/2021 FINDINGS: Left PICC with tip overlying the right atrium. Enlarged cardiac silhouette. The heart and  mediastinal contours are unchanged. Prominent hilar vasculature. No focal consolidation. Increased interstitial markings. Persistent trace to small volume left and trace right pleural effusions. No pneumothorax. No acute osseous abnormality. IMPRESSION: 1. Left PICC with tip overlying the right atrium. 2. Likely pulmonary edema with persistent trace to small volume left and trace right pleural effusions. Superimposed infection/inflammation not excluded. Followup PA and lateral chest X-ray is recommended in 3-4 weeks following therapy to ensure resolution and exclude underlying malignancy. Electronically Signed   By: Iven Finn M.D.   On: 07/15/2021 16:57   DG Chest Port 1 View  Result Date: 07/15/2021 CLINICAL DATA:  Dyspnea. EXAM: PORTABLE CHEST 1 VIEW COMPARISON:  July 13, 2021. FINDINGS: Stable cardiomegaly. Increased right basilar opacity is noted concerning for worsening edema or pneumonia. Stable left basilar opacity is noted as well. Bony thorax is unremarkable. IMPRESSION: Stable left basilar opacity is noted. Increased right basilar opacity is noted concerning for worsening edema or pneumonia. Electronically Signed   By: Marijo Conception M.D.   On: 07/15/2021 13:18   [place] EKG Site Rite  Result Date: 07/15/2021 If Site Rite image not attached, placement could not be confirmed due to current cardiac rhythm.   Cardiac Studies   LHC 1.  Significant one-vessel coronary artery disease with 70% stenosis in the mid LAD.  Sluggish TIMI II flow in all coronary arteries likely due to severely reduced cardiac output. 2.  Left ventricular angiography was not performed due to acute kidney injury.  EF was severely reduced by echo. 3.  Right heart catheterization showed moderately elevated filling pressures, mild pulmonary hypertension and severely reduced cardiac output.  Hemodynamics are consistent with cardiogenic shock with a cardiac output of 2.19 and cardiac index of 1.13.  TTE  1. Left  ventricular ejection fraction, by estimation, is <20%. The left  ventricle has severely decreased function. The left ventricle demonstrates  global hypokinesis , inferior and inferolateral wall motion best  preserved. The left ventricular internal  cavity size was moderately dilated. Left ventricular diastolic parameters  are indeterminate.   2. Right ventricular systolic function is moderately reduced. The right  ventricular size is normal. There is normal pulmonary artery systolic  pressure.   3. Left atrial size was severely dilated.   4. Right atrial size was mildly  dilated.   5. A small pericardial effusion is present.   6. The mitral valve is normal in structure. Mild to moderate mitral valve  regurgitation. No evidence of mitral stenosis.   7. The inferior vena cava is normal in size with greater than 50%  respiratory variability, suggesting right atrial pressure of 3 mmHg.   Patient Profile     66 y.o. female with previous history of nonischemic cardiomyopathy with subsequent improvement in ejection fraction, prolonged history of diabetes mellitus, hyperlipidemia and obstructive sleep apnea who presented with newly diagnosed A. fib with RVR and acute on chronic systolic heart failure.  Assessment & Plan    1.  Acute on chronic systolic heart failure with cardiogenic shock: Occurred in the setting of rapid atrial fibrillation.  Ejection fraction 10 to 15%.  Currently on norepinephrine and milrinone.  She has had issues with her pure wick catheter and thus I's and O's have not been properly measured.  Continue diuresis with IV Lasix.  2.  Atrial fibrillation with rapid ventricular response: Currently on amiodarone for rate control.  Thuy Atilano need TEE guided cardioversion next week.  Continue IV heparin.  3.  Acute on chronic CKD: Creatinine has continued to improve.     For questions or updates, please contact [place] Please consult [email or web address] for contact info under         Signed, Royalty Domagala Meredith Leeds, MD  07/17/2021, 12:32 PM

## 2021-07-17 NOTE — Consult Note (Signed)
ANTICOAGULATION CONSULT NOTE - Follow- UP  Pharmacy Consult for heparin infusion Indication: pulmonary embolus  No Known Allergies  Patient Measurements: Height: 5\' 2"  (157.5 cm) Weight: 96.3 kg (212 lb 4.9 oz) IBW/kg (Calculated) : 50.1 Heparin Dosing Weight: 71.6 kg  Vital Signs: Temp: 97.5 F (36.4 C) (01/08 0400) Temp Source: Axillary (01/08 0400) BP: 92/62 (01/08 0400) Pulse Rate: 93 (01/08 0400)  Labs: Recent Labs    07/15/21 0511 07/15/21 2359 07/16/21 0401 07/16/21 0931 07/16/21 1603 07/17/21 0249  HGB 16.4*  --  14.8  --   --  14.6  HCT 50.7*  --  45.7  --   --  43.9  PLT 166  --  205  --   --  185  HEPARINUNFRC 0.79*   < >  --  0.43 0.49 0.32  CREATININE 1.62*  --   --   --   --  1.69*   < > = values in this interval not displayed.     Estimated Creatinine Clearance: 35.9 mL/min (A) (by C-G formula based on SCr of 1.69 mg/dL (H)).   Medical History: Past Medical History:  Diagnosis Date   Abscess of breast, right 08/19/2012   Breast cancer ([place]) 07/13/2010   1.5 cm,intermediate grade DCIS, nuclear grade 2, ER 90%, PR 90% treated with wide excision, reexcision to negative margins and MammoSite partial breast radiation.   CHF (congestive heart failure) ([place])    Coronary artery disease 2009   DCIS (ductal carcinoma in situ) of breast, right 2012   Diabetes mellitus ([facility])    Type II   GERD (gastroesophageal reflux disease)    Hyperlipidemia    Hypertension    Ischemic cardiomyopathy    Obesity, unspecified    Personal history of radiation therapy    Sleep apnea    Thyroid disease    hypothyroidism   Umbilical hernia without mention of obstruction or gangrene     Medications:  No prior AC noted  Heparin Dosing Weight: 71.6 kg  Assessment: 66 y.o female with significant PMH of OSA on CPAP, hypothyroidism, diabetes mellitus, hypertension, CHF, DCIS, neutrophilia, hyperlipidemia, CAD, who presented to the ED with chief complaints of  shortness of  breath x1 week with worsening respiratory status. CT concerning for possible PE.  Date Time HL Rate/Comment 1/5 1928 1.1  Supra; 1200 > 1000 un/hr 1/6 0511 0.79 Supra; 1000 > 900 un/hr 1/6 heparin stopped at 0600; plan to resume 900 un/hr at 1700 per cards  1/6 2359 0.25 Sub 900> 950   1/7 0931  0.43  Therapeutic 1/7 1603 0.49 Therapeutic  1/8 0249 0.32 Thera     Goal of Therapy:  Heparin level 0.3-0.7 units/ml Monitor platelets by anticoagulation protocol: Yes   Plan:  Heparin level is therapeutic, although trending down.. Will slightly increase heparin infusion to 1000 units/hr. Recheck heparin level in 6 hours. CBC daily while on heparin.   Dorothe Pea, PharmD, BCPS Clinical Pharmacist   07/17/2021,5:08 AM

## 2021-07-17 NOTE — Evaluation (Signed)
Physical Therapy Evaluation Patient Details Name: Michele [name] MRN: [id number] DOB: March 02, 1956 Today's Date: 07/17/2021  History of Present Illness  66 y.o female with significant PMH of OSA on CPAP, hypothyroidism, diabetes mellitus, hypertension, CHF, DCIS, neutrophilia, hyperlipidemia, CAD, who presented to the ED with chief complaints of  shortness of breath x1 week. Pt being tx'ed for Acute Hypoxic Respiratory Failure secondary to Pulmonary Edema, Underlying Pneumonia and possible PE, started on heparin gtt. Pt s/p cath on 1/6, improved status on 1/7 and approved for therapy evaluation 24h post-start of amiodarone.  Clinical Impression  Pt seen for PT evaluation with sister-in-law present & reporting she can provide 24 hr assist upon d/c. Pt requires CGA, extra time & heavy reliance on hospital bed features to successfully complete supine>sit. Pt is unable to stand without UE support but does well when provided with RW. Pt is able to ambulate into hallway with RW & CGA on room air. Will continue to follow pt acutely to progress gait with LRAD, endurance, and balance.     Recommendations for follow up therapy are one component of a multi-disciplinary discharge planning process, led by the attending physician.  Recommendations may be updated based on patient status, additional functional criteria and insurance authorization.  Follow Up Recommendations Home health PT    Assistance Recommended at Discharge Frequent or constant Supervision/Assistance  Patient can return home with the following  A little help with walking and/or transfers;A little help with bathing/dressing/bathroom;Assistance with cooking/housework;Assist for transportation;Help with stairs or ramp for entrance    Equipment Recommendations None recommended by PT  Recommendations for Other Services       Functional Status Assessment Patient has had a recent decline in their functional status and demonstrates the ability to  make significant improvements in function in a reasonable and predictable amount of time.     Precautions / Restrictions Precautions Precautions: Fall Restrictions Weight Bearing Restrictions: No      Mobility  Bed Mobility Overal bed mobility: Needs Assistance Bed Mobility: Supine to Sit     Supine to sit: Min guard;HOB elevated     General bed mobility comments: use of bed rails, cuing to limit use of RUE, extra time    Transfers Overall transfer level: Needs assistance Equipment used: None Transfers: Sit to/from Stand Sit to Stand: Max assist (unable to achieve sit>stand without UE support; provided pt with RW & pt then able to complete sit>stand with min assist with cuing for hand placement on stable surface during stand<>sit)   Step pivot transfers: Min assist (with RW)            Ambulation/Gait Ambulation/Gait assistance: Min guard Gait Distance (Feet): 80 Feet Assistive device: Rolling walker (2 wheels) Gait Pattern/deviations: Decreased stride length;Decreased step length - right;Decreased step length - left Gait velocity: slightly decreased     General Gait Details: Cuing to decrease reliance on RW with RUE & to use AD only for balance; slightly pushing RW out in front of her  Stairs            Wheelchair Mobility    Modified Rankin (Stroke Patients Only)       Balance Overall balance assessment: Needs assistance Sitting-balance support: Feet supported Sitting balance-Leahy Scale: Good     Standing balance support: During functional activity;Bilateral upper extremity supported;Reliant on assistive device for balance Standing balance-Leahy Scale: Poor  Pertinent Vitals/Pain Pain Assessment: No/denies pain    Home Living Family/patient expects to be discharged to:: Private residence Living Arrangements: Alone Available Help at Discharge: Family (sister in law present & reports they can provide  24 hr assist) Type of Home: House Home Access: Stairs to enter;Ramped entrance Entrance Stairs-Rails: Can reach both Entrance Stairs-Number of Steps: steps to front, ramp to back   Home Layout: One level Home Equipment: Shower seat - built in;Grab bars - tub/shower;Rolling Environmental consultant (2 wheels);Cane - single point;Electric scooter      Prior Function Prior Level of Function : Independent/Modified Independent             Mobility Comments: normally INDEP, used walker recent week or two d/t increasd weakness       Hand Dominance   Dominant Hand: Right    Extremity/Trunk Assessment   Upper Extremity Assessment Upper Extremity Assessment: Overall WFL for tasks assessed (RUE brusing noted 2/2 "messed up" IV with pt elevating extremity upon PT arrival)    Lower Extremity Assessment Lower Extremity Assessment: Generalized weakness       Communication   Communication: No difficulties  Cognition Arousal/Alertness: Awake/alert Behavior During Therapy: WFL for tasks assessed/performed Overall Cognitive Status: Within Functional Limits for tasks assessed                                          General Comments General comments (skin integrity, edema, etc.): Pt on 2L/min but placed on room air upon PT arrival with SPO2 remaining >90% throughout session; max HR 135 bpm.    Exercises     Assessment/Plan    PT Assessment Patient needs continued PT services  PT Problem List Decreased mobility;Decreased strength;Decreased balance;Decreased activity tolerance;Decreased knowledge of use of DME;Cardiopulmonary status limiting activity       PT Treatment Interventions DME instruction;Therapeutic exercise;Gait training;Balance training;Stair training;Neuromuscular re-education;Functional mobility training;Therapeutic activities;Patient/family education    PT Goals (Current goals can be found in the Care Plan section)  Acute Rehab PT Goals Patient Stated Goal: get  better, go home PT Goal Formulation: With patient Time For Goal Achievement: 07/31/21 Potential to Achieve Goals: Good    Frequency Min 2X/week     Co-evaluation               AM-PAC PT "6 Clicks" Mobility  Outcome Measure Help needed turning from your back to your side while in a flat bed without using bedrails?: A Little Help needed moving from lying on your back to sitting on the side of a flat bed without using bedrails?: A Little Help needed moving to and from a bed to a chair (including a wheelchair)?: A Little Help needed standing up from a chair using your arms (e.g., wheelchair or bedside chair)?: A Little Help needed to walk in hospital room?: A Little Help needed climbing 3-5 steps with a railing? : A Lot 6 Click Score: 17    End of Session Equipment Utilized During Treatment: Gait belt Activity Tolerance: Patient tolerated treatment well Patient left: in chair;with nursing/sitter in room;with family/visitor present;with call bell/phone within reach Nurse Communication: Mobility status PT Visit Diagnosis: Unsteadiness on feet (R26.81);Muscle weakness (generalized) (M62.81)    Time: 2951-8841 PT Time Calculation (min) (ACUTE ONLY): 20 min   Charges:   PT Evaluation $PT Eval Moderate Complexity: 1 Mod PT Treatments $Therapeutic Activity: 8-22 mins  Lavone Nian, PT, DPT 07/17/21, 10:53 AM   [name] 07/17/2021, 10:52 AM

## 2021-07-17 NOTE — Progress Notes (Signed)
NAME:  Michele [name], MRN:  [id number], DOB:  1956-05-03, LOS: 4 ADMISSION DATE:  07/13/2021, CONSULTATION DATE:  07/13/2021 REFERRING MD:  Nance Pear, MD  CHIEF COMPLAINT:  SOB    HPI  66 y.o female with significant PMH of OSA on CPAP, hypothyroidism, diabetes mellitus, hypertension, CHF, DCIS, neutrophilia, hyperlipidemia, CAD, who presented to the ED with chief complaints of  shortness of breath x1 week  ED Course: On arrival to the ED, she was afebrile with initial blood pressure 100/57 mm Hg and pulse rate146  beats/min, RR 22 sats 100% on RA.Marland Kitchen There were no focal neurological deficits; patient was alert oriented x4.  Chest x-ray was obtained showed cardiac enlargement with mild vascular congestion and mild left lower lobe atelectasis or infiltrate.  CTA angio chest showed incomplete opacification of segmental branches of the right lower lobe pulmonary artery, PE could not be excluded.  Cardiomegaly with scattered groundglass opacities and small bilateral effusion consistent with mild congestive heart failure. Pertinent Labs in Red/Diagnostics   07/15/21-  patient for cardiac cath. PT/OT after this and swallow evaluation. 07/16/20- patient s/p procedure , she is improved. She remains on milrinone,amiodarone and levophed drips. Clinically reports no chest discomfort.  07/17/21- patient is improved still on vasopressors  Past Medical History  OSA on CPAP, hypothyroidism, diabetes mellitus, hypertension, CHF, DCIS, neutrophilia, hyperlipidemia, CAD,  Significant Hospital Events   1/4: Admitted to ICU with acute hypoxic respiratory failure  Consults:  Vascular  Procedures:  None  Significant Diagnostic Tests:  1/4: Chest Xray>Cardiac enlargement with mild vascular congestion Mild left lower lobe atelectasis or infiltrate 1/4: CTA Chest>1. Incomplete opacification of segmental branches of the right lower lobe pulmonary artery. Favor flow related phenomenon and mixing artifact over  nonocclusive thrombus. However, PE cannot be excluded. 2. Cardiomegaly, with scattered ground-glass opacities and small bilateral effusions consistent with mild congestive heart failure.  Micro Data:  1/4: SARS-CoV-2 PCR> negative 1/4: Influenza PCR> negative 1/5: Blood culture x2> 1/5: Urine Culture> 1/4: MRSA PCR>>  1/4: Strep pneumo urinary antigen> 1/4: Legionella urinary antigen>  Antimicrobials:  Azithromycin 1/4> Ceftriaxone 1/4>  OBJECTIVE  Blood pressure 106/69, pulse (!) 42, temperature 98 F (36.7 C), temperature source Oral, resp. rate 19, height 5' 2" (1.575 m), weight 96.3 kg, SpO2 100 %.        Intake/Output Summary (Last 24 hours) at 07/17/2021 1027 Last data filed at 07/17/2021 0800 Gross per 24 hour  Intake 1415.75 ml  Output 950 ml  Net 465.75 ml    Filed Weights   07/15/21 0946 07/16/21 0500 07/17/21 0356  Weight: 93.2 kg 96.3 kg 96.3 kg     Physical Examination  GENERAL: 66 year-old critically ill patient lying in the bed with no acute distress on BiPAP EYES: Pupils equal, round, reactive to light and accommodation. No scleral icterus. Extraocular muscles intact.  HEENT: Head atraumatic, normocephalic. Oropharynx and nasopharynx clear.  NECK:  Supple, no jugular venous distention. No thyroid enlargement, no tenderness.  LUNGS: Normal breath sounds bilaterally, no wheezing, rales,rhonchi or crepitation. No use of accessory muscles of respiration.  CARDIOVASCULAR: S1, S2 normal. No murmurs, rubs, or gallops.  ABDOMEN: Soft, nontender, nondistended. Bowel sounds present. No organomegaly or mass.  EXTREMITIES: No pedal edema, cyanosis, or clubbing.  NEUROLOGIC: Cranial nerves II through XII are intact.  Muscle strength 5/5 in all extremities. Sensation intact. Gait not checked.  PSYCHIATRIC: The patient is on BIPAP SKIN: No obvious rash, lesion, or ulcer.   Labs/imaging that I havepersonally  reviewed  (right click and "Reselect all SmartList Selections"  daily)     Labs   CBC: Recent Labs  Lab 07/13/21 1706 07/15/21 0511 07/16/21 0401 07/17/21 0249  WBC 10.0 10.0 14.7* 13.8*  HGB 15.4* 16.4* 14.8 14.6  HCT 48.1* 50.7* 45.7 43.9  MCV 100.4* 98.6 97.0 94.4  PLT 218 166 205 185     Basic Metabolic Panel: Recent Labs  Lab 07/13/21 1706 07/14/21 0253 07/15/21 0511 07/16/21 0401 07/17/21 0249  NA 134* 136 138  --  133*  K 3.9 4.0 3.9  --  3.4*  CL 102 106 108  --  100  CO2 19* 17* 17*  --  20*  GLUCOSE 298* 202* 64*  --  195*  BUN 43* 42* 45*  --  59*  CREATININE 1.76* 1.80* 1.62*  --  1.69*  CALCIUM 8.4* 7.9* 8.2*  --  7.9*  MG  --  2.2 2.2 1.9 1.9  PHOS  --  3.6 4.2 3.9 2.3*    GFR: Estimated Creatinine Clearance: 35.9 mL/min (A) (by C-G formula based on SCr of 1.69 mg/dL (H)). Recent Labs  Lab 07/13/21 1706 07/13/21 1925 07/14/21 0003 07/14/21 0253 07/15/21 0511 07/16/21 0401 07/17/21 0249  PROCALCITON  --  <0.10  --  0.18 0.33  --   --   WBC 10.0  --   --   --  10.0 14.7* 13.8*  LATICACIDVEN  --   --  3.9* 4.2*  --   --   --      Liver Function Tests: Recent Labs  Lab 07/14/21 1136  PROT 6.3*    No results for input(s): LIPASE, AMYLASE in the last 168 hours. No results for input(s): AMMONIA in the last 168 hours.  ABG    Component Value Date/Time   O2SAT 73.3 07/16/2021 0401     Coagulation Profile: Recent Labs  Lab 07/14/21 0506  INR 1.4*     Cardiac Enzymes: No results for input(s): CKTOTAL, CKMB, CKMBINDEX, TROPONINI in the last 168 hours.  HbA1C: Hemoglobin A1C  Date/Time Value Ref Range Status  06/22/2021 02:08 PM 10.0 (A) 4.0 - 5.6 % Final  07/30/2018 09:13 AM 9.9 (A) 4.0 - 5.6 % Final   Hgb A1c MFr Bld  Date/Time Value Ref Range Status  11/19/2019 11:43 AM 9.8 (H) 4.8 - 5.6 % Final    Comment:             Prediabetes: 5.7 - 6.4          Diabetes: >6.4          Glycemic control for adults with diabetes: <7.0   03/18/2018 03:53 PM 9.3 (H) 4.8 - 5.6 % Final     Comment:             Prediabetes: 5.7 - 6.4          Diabetes: >6.4          Glycemic control for adults with diabetes: <7.0     CBG: Recent Labs  Lab 07/16/21 1623 07/16/21 1918 07/16/21 2310 07/17/21 0322 07/17/21 0724  GLUCAP 262* 273* 223* 188* 220*     Review of Systems:   Unable to obtain patient is on BiPAP  Past Medical History  She,  has a past medical history of Abscess of breast, right (08/19/2012), Breast cancer ([place]) (07/13/2010), CHF (congestive heart failure) ([place]), Coronary artery disease (2009), DCIS (ductal carcinoma in situ) of breast, right (2012), Diabetes mellitus ([place]), GERD (gastroesophageal reflux disease),  Hyperlipidemia, Hypertension, Ischemic cardiomyopathy, Obesity, unspecified, Personal history of radiation therapy, Sleep apnea, Thyroid disease, and Umbilical hernia without mention of obstruction or gangrene.   Surgical History    Past Surgical History:  Procedure Laterality Date   BREAST BIOPSY Right 2007   right bx neg   BREAST BIOPSY Right 08/10/2016   fat necrosis/ done in Dr. Dwyane Luo office   BREAST EXCISIONAL BIOPSY Right 2011   DCIS mammosite lumpectomy   BREAST LUMPECTOMY Right Jan 2012   Wide excision   BREAST MASS EXCISION Right 2011   December   CARDIAC CATHETERIZATION  01/08/2008   CHOLECYSTECTOMY  2013   [facility]   HERNIA REPAIR  2013   epigastric   INCISION AND DRAINAGE BREAST ABSCESS Right 08/19/2012   TONSILLECTOMY AND ADENOIDECTOMY     age 41 yrs     Social History   reports that she has never smoked. She has never used smokeless tobacco. She reports that she does not drink alcohol and does not use drugs.   Family History   Her family history includes Arthritis in her father; CVA in her mother; Congestive Heart Failure in her mother; Diabetes in her mother and sister; Heart attack in her father, maternal grandfather, and paternal grandmother; Heart failure in her mother; Hemochromatosis in her  brother; Hypertension in her brother, brother, and sister; Lung cancer in her sister; Stroke in her maternal grandmother. There is no history of Breast cancer.   Allergies No Known Allergies   Home Medications  Prior to Admission medications   Medication Sig Start Date End Date Taking? Authorizing Provider  allopurinol (ZYLOPRIM) 300 MG tablet Take 1 tablet (300 mg total) by mouth daily. 11/19/19   Jerrol Banana., MD  amLODipine (NORVASC) 10 MG tablet TAKE 1 TABLET BY MOUTH  DAILY 04/16/19   Jerrol Banana., MD  Blood Glucose Monitoring Suppl (ONE TOUCH ULTRA SYSTEM KIT) w/Device KIT Check sugar once daily. DX E11.9 10/18/16   Jerrol Banana., MD  BYSTOLIC 20 MG TABS TAKE 1 TABLET BY MOUTH  DAILY 05/19/20   Jerrol Banana., MD  cetirizine (ZYRTEC) 10 MG tablet Take 1 tablet (10 mg total) by mouth daily. 03/18/18   Jerrol Banana., MD  fluticasone [facility]) 50 MCG/ACT nasal spray Place into the nose. 02/24/13   [provider]  glimepiride (AMARYL) 4 MG tablet TAKE 1 TABLET BY MOUTH  TWICE DAILY 02/25/20   Jerrol Banana., MD  Glucagon (GVOKE HYPOPEN 1-PACK) 1 MG/0.2ML SOAJ Inject 1 mg into the skin every 15 (fifteen) minutes as needed for up to 5 doses. 06/22/21   Myles Gip, DO  glucose blood (ONE TOUCH ULTRA TEST) test strip Use as instructed 12/10/17   Jerrol Banana., MD  insulin glargine (LANTUS SOLOSTAR) 100 UNIT/ML Solostar Pen INJECT 50 UNITS UNDER THE SKIN TWICE DAILY 12/31/19   Jerrol Banana., MD  Insulin Pen Needle 32G X 4 MM MISC Inject twice daily, SQ. DX E11.9 11/19/19   Jerrol Banana., MD  Lancets [facility] ULTRASOFT) lancets Check sugar once daily DX E11.9 12/10/17   Jerrol Banana., MD  levothyroxine (SYNTHROID) 200 MCG tablet TAKE 1 TABLET(200 MCG) BY MOUTH DAILY BEFORE BREAKFAST 06/22/21   Myles Gip, DO  losartan (COZAAR) 100 MG tablet TAKE 1 TABLET BY MOUTH  DAILY 02/06/20   Jerrol Banana., MD  NON FORMULARY CPAP  AT BEDTIME    [provider]  rosuvastatin (CRESTOR) 10 MG tablet TAKE 1 TABLET BY MOUTH  DAILY 11/28/19   Minna Merritts, MD  Syringe/Needle, Disp, (SYRINGE 3CC/25GX1") 25G X 1" 3 ML MISC 1 mL by Does not apply route every 30 (thirty) days. 05/03/16   Jerrol Banana., MD  Scheduled Meds:  Chlorhexidine Gluconate Cloth  6 each Topical Daily   furosemide  40 mg Intravenous Q12H   insulin aspart  0-20 Units Subcutaneous Q4H   levothyroxine  200 mcg Oral Q0600   methylPREDNISolone (SOLU-MEDROL) injection  40 mg Intravenous Q12H   mupirocin ointment  1 application Nasal BID   potassium chloride  20 mEq Oral Once   rosuvastatin  10 mg Oral Daily   sodium chloride flush  10-40 mL Intracatheter Q12H   Continuous Infusions:  sodium chloride Stopped (07/14/21 0344)   sodium chloride     amiodarone 30 mg/hr (07/17/21 0822)   heparin 1,000 Units/hr (07/17/21 0800)   milrinone 0.25 mcg/kg/min (07/17/21 0800)   norepinephrine (LEVOPHED) Adult infusion 2 mcg/min (07/17/21 0800)   potassium PHOSPHATE IVPB (in mmol)     PRN Meds:.acetaminophen, docusate sodium, ondansetron (ZOFRAN) IV, polyethylene glycol, sodium chloride flush    Assessment & Plan:  Acute Hypoxic Respiratory Failure secondary to Pulmonary Edema, Underlying Pneumonia and Probable Pulmonary Embolism PMHx: OSA on CPAP -Supplemental O2 as needed to maintain O2 saturations 88 to 92% -BiPAP, wean as tolerated -High risk for intubation -Given elevated d -dimer, high risk score for PE and ? PE on CT will start on Heparin gtt -Will obtain US Bilateral eval for DVT -Follow intermittent ABG and chest x-ray as needed -As needed bronchodilators           -rvp   CICRUCLATORY SHOCK  CHF decompnesation with cardiogenic shock due to elevated BNP and absence of evidence for infection         -TTE with EF 10%  -cardio on case appreciate recommendations - [facility]  Sepsis due to suspected  Pneumonia-RULED OUT         THERE IS ABSENCE OF LEUKOCYSTOSIS AND PROCAL IS NEGATIVE -Supplemental oxygen as needed, to maintain SpO2 > 90% -F/u cultures, trend lactic/ PCT -Monitor WBC/ fever curve -Continue abx with ceftriaxone and Azithromycin pending cultures -Repeat blood cultures until clear -Pressors for MAP goal >65 -Strict I/O's    Bilateral pleural effusions    Right is moderate - possible empyema due to findings consisntent with pna  New onset AFib+RVR Mild Elevated Troponin likely demand ischemia in the setting of Afib/?PE -Serial EKGs -Trend Troponins -TSH, FT4 -TTEcho -Low dose metoprolol for rate control -continue Heparin as above -Cardiology Consult  Acute Onset CHF Hypertension Hx: CAD, HLD  -Continuous cardiac monitoring -Maintain MAP greater than 65 -Hold BP meds in the setting of hypotension -IV Lasix as blood pressure and renal function permits; currently on Lasix 20 mg IV BID -Repeat 2D Echocardiogram -Cardiology Consult   Acute Kidney Injury -Monitor I&O's / urinary output -Follow BMP -Ensure adequate renal perfusion -Avoid nephrotoxic agents as able -Replace electrolytes as indicated   Diabetes mellitus -CBGs -Sliding scale insulin -Follow ICU hyper/hypoglycemia protocol -Hold home Amaryl   Hypothyroidism Check TSH, Free T4 -On synthroid  Best practice:  Diet:  NPO Pain/Anxiety/Delirium protocol (if indicated): No VAP protocol (if indicated): Not indicated DVT prophylaxis: Systemic AC GI prophylaxis: N/A Glucose control:  SSI Yes Central venous access:  N/A Arterial line:  N/A Foley:  Yes, and it  is still needed Mobility:  bed rest  PT consulted: N/A Last date of multidisciplinary goals of care discussion [1/4] Code Status:  full code Disposition: ICU   = Goals of Care = Code Status Order:  FULL  Primary Emergency Contact: Richardson,Robey Wishes to pursue full aggressive treatment and intervention options, including CPR  and intubation, but goals of care will be addressed on going with family if that should become necessary.   Critical care provider statement:   Total critical care time: 33 minutes   Performed by: Lanney Gins MD   Critical care time was exclusive of separately billable procedures and treating other patients.   Critical care was necessary to treat or prevent imminent or life-threatening deterioration.   Critical care was time spent personally by me on the following activities: development of treatment plan with patient and/or surrogate as well as nursing, discussions with consultants, evaluation of patient's response to treatment, examination of patient, obtaining history from patient or surrogate, ordering and performing treatments and interventions, ordering and review of laboratory studies, ordering and review of radiographic studies, pulse oximetry and re-evaluation of patient's condition.    Ottie Glazier, M.D.  Pulmonary & Critical Care Medicine

## 2021-07-17 NOTE — Consult Note (Signed)
ANTICOAGULATION CONSULT NOTE - Follow- UP  Pharmacy Consult for heparin infusion Indication: pulmonary embolus  No Known Allergies  Patient Measurements: Height: 5\' 2"  (157.5 cm) Weight: 96.3 kg (212 lb 4.9 oz) IBW/kg (Calculated) : 50.1 Heparin Dosing Weight: 71.6 kg  Vital Signs: Temp: 98 F (36.7 C) (01/08 0800) Temp Source: Oral (01/08 1200) BP: 104/77 (01/08 1200) Pulse Rate: 42 (01/08 0800)  Labs: Recent Labs    07/15/21 0511 07/15/21 2359 07/16/21 0401 07/16/21 0931 07/16/21 1603 07/17/21 0249 07/17/21 1217  HGB 16.4*  --  14.8  --   --  14.6  --   HCT 50.7*  --  45.7  --   --  43.9  --   PLT 166  --  205  --   --  185  --   HEPARINUNFRC 0.79*   < >  --    < > 0.49 0.32 0.33  CREATININE 1.62*  --   --   --   --  1.69*  --    < > = values in this interval not displayed.     Estimated Creatinine Clearance: 35.9 mL/min (A) (by C-G formula based on SCr of 1.69 mg/dL (H)).   Medical History: Past Medical History:  Diagnosis Date   Abscess of breast, right 08/19/2012   Breast cancer ([place]) 07/13/2010   1.5 cm,intermediate grade DCIS, nuclear grade 2, ER 90%, PR 90% treated with wide excision, reexcision to negative margins and MammoSite partial breast radiation.   CHF (congestive heart failure) ([place])    Coronary artery disease 2009   DCIS (ductal carcinoma in situ) of breast, right 2012   Diabetes mellitus ([facility])    Type II   GERD (gastroesophageal reflux disease)    Hyperlipidemia    Hypertension    Ischemic cardiomyopathy    Obesity, unspecified    Personal history of radiation therapy    Sleep apnea    Thyroid disease    hypothyroidism   Umbilical hernia without mention of obstruction or gangrene     Medications:  No prior AC noted  Heparin Dosing Weight: 71.6 kg  Assessment: 66 y.o female with significant PMH of OSA on CPAP, hypothyroidism, diabetes mellitus, hypertension, CHF, DCIS, neutrophilia, hyperlipidemia, CAD, who presented to the ED  with chief complaints of  shortness of breath x1 week with worsening respiratory status. CT concerning for possible PE.  Date Time HL Rate/Comment 1/5 1928 1.1  Supra; 1200 > 1000 un/hr 1/6 0511 0.79 Supra; 1000 > 900 un/hr 1/6 heparin stopped at 0600; plan to resume 900 un/hr at 1700 per cards  1/6 2359 0.25 Sub 900> 950   1/7 0931  0.43  Therapeutic 1/7 1603 0.49 Therapeutic  1/8 0249 0.32 Thera  1/8 1217 0.33 thera    Goal of Therapy:  Heparin level 0.3-0.7 units/ml Monitor platelets by anticoagulation protocol: Yes   Plan:  Heparin level is therapeutic, although.. Will continue heparin infusion at 1000 units/hr. Recheck heparin level and CBC with AM labs.    Oswald Hillock, PharmD, BCPS Clinical Pharmacist   07/17/2021,12:48 PM

## 2021-07-17 NOTE — Progress Notes (Signed)
Right forearm PIV was found to be infiltrated when assessed this AM. Swelling, redness, and tenderness present. Discontinued Amiodarone infusion from site. IV consult placed for additional PIV placement for scheduled and PRN infusions due to drug compatibility. Alex, pharmacist consulted, dry heat, elevation and hyaluronidase consideration. Charge RN and Dr. Lanney Gins notified.

## 2021-07-17 NOTE — Consult Note (Signed)
PHARMACY CONSULT NOTE - FOLLOW UP  Pharmacy Consult for Electrolyte Monitoring and Replacement   Recent Labs: Potassium (mmol/L)  Date Value  07/17/2021 3.4 (L)   Magnesium (mg/dL)  Date Value  07/17/2021 1.9   Calcium (mg/dL)  Date Value  07/17/2021 7.9 (L)   Albumin (g/dL)  Date Value  06/22/2021 4.6   Phosphorus (mg/dL)  Date Value  07/17/2021 2.3 (L)   Sodium (mmol/L)  Date Value  07/17/2021 133 (L)  06/22/2021 138     Assessment: 66 y.o female with significant PMH of OSA on CPAP, hypothyroidism, DM, HTN, CHF, DCIS, neutrophilia, HLD, CAD, who presented to the ED with chief complaints of  SOB x1 week, with worsening respiratory status. CT concerning for possible PE. Pharmacy consulted for mgmt of electrolytes and renal adjustment monitoring.  On lasix 40 mg IV BID, on amio gtt.    Goal of Therapy:  Lytes WNL  Plan:  Medical team ordered Kphos IV 15 mmol x 1 and Kcl 20 mEq x 1. Will order Kcl 20 mEq x 1 in the PM.  F/u with AM labs.   Eleonore Chiquito, PharmD,  Clinical Pharmacist 07/17/2021 8:34 AM

## 2021-07-18 ENCOUNTER — Encounter: Payer: Self-pay | Admitting: Cardiovascular Disease

## 2021-07-18 DIAGNOSIS — I4891 Unspecified atrial fibrillation: Secondary | ICD-10-CM | POA: Diagnosis not present

## 2021-07-18 DIAGNOSIS — N179 Acute kidney failure, unspecified: Secondary | ICD-10-CM | POA: Diagnosis not present

## 2021-07-18 DIAGNOSIS — I5023 Acute on chronic systolic (congestive) heart failure: Secondary | ICD-10-CM | POA: Diagnosis not present

## 2021-07-18 DIAGNOSIS — E1169 Type 2 diabetes mellitus with other specified complication: Secondary | ICD-10-CM | POA: Diagnosis not present

## 2021-07-18 LAB — BASIC METABOLIC PANEL
Anion gap: 10 (ref 5–15)
Anion gap: 13 (ref 5–15)
BUN: 57 mg/dL — ABNORMAL HIGH (ref 8–23)
BUN: 58 mg/dL — ABNORMAL HIGH (ref 8–23)
CO2: 21 mmol/L — ABNORMAL LOW (ref 22–32)
CO2: 24 mmol/L (ref 22–32)
Calcium: 7.7 mg/dL — ABNORMAL LOW (ref 8.9–10.3)
Calcium: 7.9 mg/dL — ABNORMAL LOW (ref 8.9–10.3)
Chloride: 96 mmol/L — ABNORMAL LOW (ref 98–111)
Chloride: 99 mmol/L (ref 98–111)
Creatinine, Ser: 1.66 mg/dL — ABNORMAL HIGH (ref 0.44–1.00)
Creatinine, Ser: 1.77 mg/dL — ABNORMAL HIGH (ref 0.44–1.00)
GFR, Estimated: 32 mL/min — ABNORMAL LOW (ref >60)
GFR, Estimated: 34 mL/min — ABNORMAL LOW (ref >60)
Glucose, Bld: 146 mg/dL — ABNORMAL HIGH (ref 70–99)
Glucose, Bld: 306 mg/dL — ABNORMAL HIGH (ref 70–99)
Potassium: 3.6 mmol/L (ref 3.5–5.1)
Potassium: 4.5 mmol/L (ref 3.5–5.1)
Sodium: 130 mmol/L — ABNORMAL LOW (ref 135–145)
Sodium: 133 mmol/L — ABNORMAL LOW (ref 135–145)

## 2021-07-18 LAB — CBC
HCT: 41.8 % (ref 36.0–46.0)
Hemoglobin: 14 g/dL (ref 12.0–15.0)
MCH: 32.3 pg (ref 26.0–34.0)
MCHC: 33.5 g/dL (ref 30.0–36.0)
MCV: 96.3 fL (ref 80.0–100.0)
Platelets: 171 10*3/uL (ref 150–400)
RBC: 4.34 MIL/uL (ref 3.87–5.11)
RDW: 15.5 % (ref 11.5–15.5)
WBC: 13.5 10*3/uL — ABNORMAL HIGH (ref 4.0–10.5)
nRBC: 0.6 % — ABNORMAL HIGH (ref 0.0–0.2)

## 2021-07-18 LAB — GLUCOSE, CAPILLARY
Glucose-Capillary: 121 mg/dL — ABNORMAL HIGH (ref 70–99)
Glucose-Capillary: 121 mg/dL — ABNORMAL HIGH (ref 70–99)
Glucose-Capillary: 122 mg/dL — ABNORMAL HIGH (ref 70–99)
Glucose-Capillary: 138 mg/dL — ABNORMAL HIGH (ref 70–99)
Glucose-Capillary: 145 mg/dL — ABNORMAL HIGH (ref 70–99)
Glucose-Capillary: 152 mg/dL — ABNORMAL HIGH (ref 70–99)
Glucose-Capillary: 153 mg/dL — ABNORMAL HIGH (ref 70–99)
Glucose-Capillary: 229 mg/dL — ABNORMAL HIGH (ref 70–99)
Glucose-Capillary: 254 mg/dL — ABNORMAL HIGH (ref 70–99)
Glucose-Capillary: 307 mg/dL — ABNORMAL HIGH (ref 70–99)
Glucose-Capillary: 369 mg/dL — ABNORMAL HIGH (ref 70–99)
Glucose-Capillary: 370 mg/dL — ABNORMAL HIGH (ref 70–99)

## 2021-07-18 LAB — HEPARIN LEVEL (UNFRACTIONATED): Heparin Unfractionated: 0.39 IU/mL (ref 0.30–0.70)

## 2021-07-18 LAB — PHOSPHORUS: Phosphorus: 2.8 mg/dL (ref 2.5–4.6)

## 2021-07-18 MED ORDER — INSULIN ASPART 100 UNIT/ML IJ SOLN
0.0000 [IU] | INTRAMUSCULAR | Status: DC
  Administered 2021-07-18: 4 [IU] via SUBCUTANEOUS
  Administered 2021-07-18: 20 [IU] via SUBCUTANEOUS
  Administered 2021-07-18: 15 [IU] via SUBCUTANEOUS
  Administered 2021-07-18: 20 [IU] via SUBCUTANEOUS
  Administered 2021-07-18 – 2021-07-19 (×2): 11 [IU] via SUBCUTANEOUS
  Administered 2021-07-19: 4 [IU] via SUBCUTANEOUS
  Administered 2021-07-19 – 2021-07-20 (×4): 3 [IU] via SUBCUTANEOUS
  Administered 2021-07-20 (×2): 7 [IU] via SUBCUTANEOUS
  Administered 2021-07-20: 5 [IU] via SUBCUTANEOUS
  Administered 2021-07-20: 11 [IU] via SUBCUTANEOUS
  Administered 2021-07-21: 4 [IU] via SUBCUTANEOUS
  Administered 2021-07-21: 7 [IU] via SUBCUTANEOUS
  Administered 2021-07-22: 3 [IU] via SUBCUTANEOUS
  Filled 2021-07-18 (×20): qty 1

## 2021-07-18 MED ORDER — INSULIN ASPART 100 UNIT/ML IJ SOLN: 3.0000 [IU] | INTRAMUSCULAR | Status: DC

## 2021-07-18 MED ORDER — INSULIN DETEMIR 100 UNIT/ML ~~LOC~~ SOLN
10.0000 [IU] | Freq: Two times a day (BID) | SUBCUTANEOUS | Status: DC
  Filled 2021-07-18: qty 0.1

## 2021-07-18 MED ORDER — POTASSIUM CHLORIDE CRYS ER 20 MEQ PO TBCR
40.0000 meq | EXTENDED_RELEASE_TABLET | Freq: Once | ORAL | Status: AC
  Administered 2021-07-18: 40 meq via ORAL
  Filled 2021-07-18: qty 2

## 2021-07-18 MED ORDER — FUROSEMIDE 10 MG/ML IJ SOLN
60.0000 mg | Freq: Two times a day (BID) | INTRAMUSCULAR | Status: DC
  Administered 2021-07-18 – 2021-07-19 (×2): 60 mg via INTRAVENOUS
  Filled 2021-07-18 (×2): qty 6

## 2021-07-18 MED ORDER — INSULIN ASPART 100 UNIT/ML IJ SOLN
5.0000 [IU] | Freq: Once | INTRAMUSCULAR | Status: AC
  Administered 2021-07-19: 5 [IU] via SUBCUTANEOUS

## 2021-07-18 MED ORDER — INSULIN DETEMIR 100 UNIT/ML ~~LOC~~ SOLN
15.0000 [IU] | Freq: Two times a day (BID) | SUBCUTANEOUS | Status: DC
  Administered 2021-07-18: 15 [IU] via SUBCUTANEOUS
  Filled 2021-07-18 (×3): qty 0.15

## 2021-07-18 MED ORDER — INSULIN DETEMIR 100 UNIT/ML ~~LOC~~ SOLN
20.0000 [IU] | Freq: Two times a day (BID) | SUBCUTANEOUS | Status: DC
  Administered 2021-07-18 – 2021-07-21 (×7): 20 [IU] via SUBCUTANEOUS
  Filled 2021-07-18 (×11): qty 0.2

## 2021-07-18 MED ORDER — INSULIN ASPART 100 UNIT/ML IJ SOLN
5.0000 [IU] | Freq: Once | INTRAMUSCULAR | Status: AC
  Administered 2021-07-18: 5 [IU] via SUBCUTANEOUS
  Filled 2021-07-18: qty 1

## 2021-07-18 NOTE — Progress Notes (Signed)
Physical Therapy Treatment Patient Details Name: Michele [name] MRN: [id number] DOB: April 05, 1956 Today's Date: 07/18/2021   History of Present Illness 66 y.o female with significant PMH of OSA on CPAP, hypothyroidism, diabetes mellitus, hypertension, CHF, DCIS, neutrophilia, hyperlipidemia, CAD, who presented to the ED with chief complaints of  shortness of breath x1 week. Pt being tx'ed for Acute Hypoxic Respiratory Failure secondary to Pulmonary Edema, Underlying Pneumonia and possible PE, started on heparin gtt. Pt s/p cath on 1/6, improved status on 1/7 and approved for therapy evaluation 24h post-start of amiodarone.    PT Comments    Pt received seated in recliner upon arrival to room, with pt agreeable to therapy.  Pt was able to perform STS with good technique utilizing BUE's for support on the chair, then walker.  Pt then transitioned to ambulating an increased distance within the ICU unit/wing.  Pt advised to continue to utilize her LE's for support rather than her UE's and the walker.  Pt is improving well with a minimal increase in HR from baseline.  Pt returned to recliner with all needs within reach.  Current discharge plans to home with HHPT remain appropriate at this time.  Pt will continue to benefit from skilled therapy in order to address deficits listed below.      Recommendations for follow up therapy are one component of a multi-disciplinary discharge planning process, led by the attending physician.  Recommendations may be updated based on patient status, additional functional criteria and insurance authorization.  Follow Up Recommendations  Home health PT     Assistance Recommended at Discharge Frequent or constant Supervision/Assistance  Patient can return home with the following A little help with walking and/or transfers;A little help with bathing/dressing/bathroom;Assistance with cooking/housework;Assist for transportation;Help with stairs or ramp for entrance    Equipment Recommendations  None recommended by PT    Recommendations for Other Services       Precautions / Restrictions Precautions Precautions: Fall Restrictions Weight Bearing Restrictions: No     Mobility  Bed Mobility               General bed mobility comments: pt upright in recliner upon arrival to the room. Patient Response: Cooperative  Transfers Overall transfer level: Needs assistance Equipment used: Rolling walker (2 wheels) Transfers: Sit to/from Stand Sit to Stand: Min guard           General transfer comment: line/lead mgt as well as slight assist for steadying. No s/s of dizziness.    Ambulation/Gait Ambulation/Gait assistance: Min guard Gait Distance (Feet): 100 Feet Assistive device: Rolling walker (2 wheels) Gait Pattern/deviations: Decreased stride length;Decreased step length - right;Decreased step length - left Gait velocity: slightly decreased     General Gait Details: Continued cuing for utilizing LE's for support rather than UE's on the walker.   Stairs             Wheelchair Mobility    Modified Rankin (Stroke Patients Only)       Balance Overall balance assessment: Needs assistance Sitting-balance support: Feet supported Sitting balance-Leahy Scale: Good     Standing balance support: During functional activity;Bilateral upper extremity supported;Reliant on assistive device for balance Standing balance-Leahy Scale: Poor                              Cognition Arousal/Alertness: Awake/alert Behavior During Therapy: WFL for tasks assessed/performed Overall Cognitive Status: Within Functional Limits for tasks assessed  Exercises      General Comments        Pertinent Vitals/Pain Pain Assessment: No/denies pain    Home Living                          Prior Function            PT Goals (current goals can now be found in  the care plan section) Acute Rehab PT Goals Patient Stated Goal: get better, go home PT Goal Formulation: With patient Time For Goal Achievement: 07/31/21 Potential to Achieve Goals: Good Progress towards PT goals: Progressing toward goals    Frequency    Min 2X/week      PT Plan      Co-evaluation              AM-PAC PT "6 Clicks" Mobility   Outcome Measure  Help needed turning from your back to your side while in a flat bed without using bedrails?: A Little Help needed moving from lying on your back to sitting on the side of a flat bed without using bedrails?: A Little Help needed moving to and from a bed to a chair (including a wheelchair)?: A Little Help needed standing up from a chair using your arms (e.g., wheelchair or bedside chair)?: A Little Help needed to walk in hospital room?: A Little Help needed climbing 3-5 steps with a railing? : A Lot 6 Click Score: 17    End of Session Equipment Utilized During Treatment: Gait belt Activity Tolerance: Patient tolerated treatment well Patient left: in chair;with nursing/sitter in room;with family/visitor present;with call bell/phone within reach Nurse Communication: Mobility status PT Visit Diagnosis: Unsteadiness on feet (R26.81);Muscle weakness (generalized) (M62.81)     Time: 5631-4970 PT Time Calculation (min) (ACUTE ONLY): 18 min  Charges:  $Gait Training: 8-22 mins                     Gwenlyn Saran, PT, DPT 07/18/21, 4:47 PM    Christie Nottingham 07/18/2021, 4:43 PM

## 2021-07-18 NOTE — Progress Notes (Signed)
Progress Note  Patient Name: Michele [name] Date of Encounter: 07/18/2021  [facility] Cardiologist: Rockey Situ  Subjective   She reports improvement in shortness of breath. Output improved over the last 24 hours but she is still positive for the admission. Blood pressure improved and she is on minimal dose of norepinephrine drip. She continues to be in atrial fibrillation on amiodarone drip.  Ventricular rate goes as high as 130 bpm.  Venous oxygen saturation improved from 55 before starting milrinone to 73.3.  Inpatient Medications    Scheduled Meds:  Chlorhexidine Gluconate Cloth  6 each Topical Daily   furosemide  60 mg Intravenous Q12H   insulin aspart  0-20 Units Subcutaneous Q4H   insulin detemir  15 Units Subcutaneous Q12H   levothyroxine  200 mcg Oral Q0600   methylPREDNISolone (SOLU-MEDROL) injection  40 mg Intravenous Q12H   mupirocin ointment  1 application Nasal BID   rosuvastatin  10 mg Oral Daily   sodium chloride flush  10-40 mL Intracatheter Q12H   Continuous Infusions:  sodium chloride Stopped (07/14/21 0344)   sodium chloride 10 mL/hr at 07/18/21 1100   amiodarone 60 mg/hr (07/18/21 1100)   heparin 1,000 Units/hr (07/18/21 1100)   milrinone 0.25 mcg/kg/min (07/18/21 1100)   norepinephrine (LEVOPHED) Adult infusion 2 mcg/min (07/18/21 1100)   PRN Meds: acetaminophen, docusate sodium, ondansetron (ZOFRAN) IV, polyethylene glycol, sodium chloride flush   Vital Signs    Vitals:   07/18/21 0930 07/18/21 1000 07/18/21 1030 07/18/21 1100  BP: 97/76 98/87 99/76  97/74  Pulse: 96 (!) 120    Resp: (!) 21 16 16 15   Temp:      TempSrc:      SpO2: 98% 98% 98%   Weight:      Height:        Intake/Output Summary (Last 24 hours) at 07/18/2021 1144 Last data filed at 07/18/2021 1100 Gross per 24 hour  Intake 2028.38 ml  Output 1475 ml  Net 553.38 ml    Last 3 Weights 07/18/2021 07/17/2021 07/16/2021  Weight (lbs) 210 lb 12.2 oz 212 lb 4.9 oz 212 lb 4.9 oz   Weight (kg) 95.6 kg 96.3 kg 96.3 kg      Telemetry    Atrial fibrillation with ventricular rate between 80 to 130 bpm.- Personally Reviewed  ECG     - Personally Reviewed  Physical Exam   GEN: No acute distress.   Neck: Mild JVD Cardiac: Irregularly irregular, no murmurs, rubs, or gallops.  Respiratory: Clear to auscultation bilaterally. GI: Soft, nontender, non-distended  MS: Mild edema; No deformity. Neuro:  Nonfocal  Psych: Normal affect   Labs    High Sensitivity Troponin:   Recent Labs  Lab 07/13/21 1706 07/13/21 1925  TROPONINIHS 25* 25*      Chemistry Recent Labs  Lab 07/14/21 1136 07/15/21 0511 07/16/21 0401 07/17/21 0249 07/17/21 1633 07/18/21 0000 07/18/21 0522  NA  --  138  --  133* 131* 130* 133*  K  --  3.9  --  3.4* 3.6 4.5 3.6  CL  --  108  --  100 98 96* 99  CO2  --  17*  --  20* 21* 21* 24  GLUCOSE  --  64*  --  195* 448* 306* 146*  BUN  --  45*  --  59* 59* 58* 57*  CREATININE  --  1.62*  --  1.69* 1.75* 1.77* 1.66*  CALCIUM  --  8.2*  --  7.9* 7.8* 7.9* 7.7*  MG  --  2.2 1.9 1.9  --   --   --   PROT 6.3*  --   --   --   --   --   --   GFRNONAA  --  35*  --  33* 32* 32* 34*  ANIONGAP  --  13  --  13 12 13 10      Lipids No results for input(s): CHOL, TRIG, HDL, LABVLDL, LDLCALC, CHOLHDL in the last 168 hours.  Hematology Recent Labs  Lab 07/16/21 0401 07/17/21 0249 07/18/21 0522  WBC 14.7* 13.8* 13.5*  RBC 4.71 4.65 4.34  HGB 14.8 14.6 14.0  HCT 45.7 43.9 41.8  MCV 97.0 94.4 96.3  MCH 31.4 31.4 32.3  MCHC 32.4 33.3 33.5  RDW 15.3 15.2 15.5  PLT 205 185 171    Thyroid  Recent Labs  Lab 07/13/21 1925  TSH 8.127*  FREET4 0.65     BNP Recent Labs  Lab 07/13/21 1706  BNP 738.3*     DDimer  Recent Labs  Lab 07/15/21 0511 07/16/21 0401 07/17/21 0249  DDIMER 2.00* 1.60* 3.07*      Radiology    No results found.  Cardiac Studies   Echocardiogram was done yesterday and was personally reviewed by me.  It  showed an EF of 10 to 15% with multiple wall motion abnormalities in the anterior, anterolateral and mid to distal inferior.  Pulmonary pressure was normal with mild to moderate mitral regurgitation.  Patient Profile     66 y.o. female with previous history of nonischemic cardiomyopathy with subsequent improvement in ejection fraction, prolonged history of diabetes mellitus, hyperlipidemia and obstructive sleep apnea who presented with newly diagnosed A. fib with RVR and acute on chronic systolic heart failure.  Assessment & Plan    1.  Acute on chronic systolic heart failure with cardiogenic shock in the setting of atrial fibrillation with rapid ventricular response.  Echocardiogram showed an EF of 10 to 15%.   Continue milrinone at the current rate.  Venous oxygen saturation improved significantly. She is still volume overloaded and thus I increase furosemide to 60 mg intravenously twice daily. Blood pressure is low and does not allow the addition of heart failure medications at the present time.  Hopefully will be able to do so once we get her back in sinus rhythm. We will plan on adding an SGLT2 inhibitor in the next few days.   2.  Atrial fibrillation with rapid ventricular response: This is likely significantly contributing to heart failure.  Continue amiodarone drip and recommend proceeding with TEE guided cardioversion tomorrow.  I discussed the procedure in details as well as risk and benefits.  3.  Poorly controlled diabetes mellitus: Recommend an SGLT2 inhibitor given significant heart failure.  4.  Acute on chronic kidney disease: Renal function is stable with diuresis.  5.  Coronary artery disease: Cardiac catheterization last week showed 70% stenosis in the mid LAD.  Recommend medical therapy for now and consider FFR guided revascularization as an outpatient once her heart failure is optimized.   Total encounter time more than 35 minutes. Greater than 50% was spent in  counseling and coordination of care with the patient.      For questions or updates, please contact [place] Please consult [email or web address] for contact info under        Signed, Kathlyn Sacramento, MD  07/18/2021, 11:44 AM

## 2021-07-18 NOTE — Progress Notes (Signed)
° ° °  Patient to undergo TEE/DCCV 07/19/2021 at 12:30. Case request and orders placed. cardiology MD, anesthesia, and special recovery aware.

## 2021-07-18 NOTE — Consult Note (Signed)
PHARMACY CONSULT NOTE - FOLLOW UP  Pharmacy Consult for Electrolyte Monitoring and Replacement   Recent Labs: Potassium (mmol/L)  Date Value  07/18/2021 3.6   Magnesium (mg/dL)  Date Value  07/17/2021 1.9   Calcium (mg/dL)  Date Value  07/18/2021 7.7 (L)   Albumin (g/dL)  Date Value  06/22/2021 4.6   Phosphorus (mg/dL)  Date Value  07/18/2021 2.8   Sodium (mmol/L)  Date Value  07/18/2021 133 (L)  06/22/2021 138     Assessment: 66 y.o female with significant PMH of OSA on CPAP, hypothyroidism, DM, HTN, CHF, DCIS, neutrophilia, HLD, CAD, who presented to the ED with chief complaints of  SOB x1 week, with worsening respiratory status. CT concerning for possible PE. Pharmacy consulted for management of electrolytes.  Patient with new onset afib on amiodarone and heparin infusions. Plan for cardioversion 1/10.  Also with acute on chronic systolic heart failure with cardiogenic shock in the setting of above. EF 10-15%. She is in the ICU on milrinone infusion. Diuresis increased to Lasix IV 60 mg BID.   Goal of Therapy:  Electrolytes WNL  Plan:  --Potassium 3.6 this morning, trending down from labs earlier in morning --Will give potassium 40 mEq PO given ongoing diuresis --Follow up with morning labs  Tawnya Crook, PharmD, BCPS Clinical Pharmacist 07/18/2021 2:17 PM

## 2021-07-19 ENCOUNTER — Inpatient Hospital Stay: Payer: Medicare Other | Admitting: Anesthesiology

## 2021-07-19 ENCOUNTER — Inpatient Hospital Stay (HOSPITAL_COMMUNITY)
Admit: 2021-07-19 | Discharge: 2021-07-19 | Disposition: A | Discharge status: Home / Self Care | Payer: Medicare Other | Attending: Physician Assistant | Admitting: Physician Assistant

## 2021-07-19 ENCOUNTER — Encounter
Admission: EM | Disposition: A | Discharge status: Home Health | Payer: Self-pay | Source: Home / Self Care | Attending: Pulmonary Disease

## 2021-07-19 DIAGNOSIS — I4819 Other persistent atrial fibrillation: Secondary | ICD-10-CM | POA: Diagnosis not present

## 2021-07-19 DIAGNOSIS — R570 Cardiogenic shock: Secondary | ICD-10-CM | POA: Diagnosis not present

## 2021-07-19 DIAGNOSIS — I340 Nonrheumatic mitral (valve) insufficiency: Secondary | ICD-10-CM | POA: Diagnosis not present

## 2021-07-19 DIAGNOSIS — I361 Nonrheumatic tricuspid (valve) insufficiency: Secondary | ICD-10-CM

## 2021-07-19 DIAGNOSIS — I5023 Acute on chronic systolic (congestive) heart failure: Secondary | ICD-10-CM | POA: Diagnosis not present

## 2021-07-19 DIAGNOSIS — I4891 Unspecified atrial fibrillation: Secondary | ICD-10-CM

## 2021-07-19 HISTORY — PX: TEE WITHOUT CARDIOVERSION: SHX5443

## 2021-07-19 LAB — CBC
HCT: 42.7 % (ref 36.0–46.0)
Hemoglobin: 14.2 g/dL (ref 12.0–15.0)
MCH: 31.3 pg (ref 26.0–34.0)
MCHC: 33.3 g/dL (ref 30.0–36.0)
MCV: 94.1 fL (ref 80.0–100.0)
Platelets: 167 10*3/uL (ref 150–400)
RBC: 4.54 MIL/uL (ref 3.87–5.11)
RDW: 15.6 % — ABNORMAL HIGH (ref 11.5–15.5)
WBC: 14.7 10*3/uL — ABNORMAL HIGH (ref 4.0–10.5)
nRBC: 0.8 % — ABNORMAL HIGH (ref 0.0–0.2)

## 2021-07-19 LAB — GLUCOSE, CAPILLARY
Glucose-Capillary: 173 mg/dL — ABNORMAL HIGH (ref 70–99)
Glucose-Capillary: 134 mg/dL — ABNORMAL HIGH (ref 70–99)
Glucose-Capillary: 140 mg/dL — ABNORMAL HIGH (ref 70–99)
Glucose-Capillary: 144 mg/dL — ABNORMAL HIGH (ref 70–99)
Glucose-Capillary: 265 mg/dL — ABNORMAL HIGH (ref 70–99)

## 2021-07-19 LAB — BASIC METABOLIC PANEL
Anion gap: 11 (ref 5–15)
BUN: 58 mg/dL — ABNORMAL HIGH (ref 8–23)
CO2: 23 mmol/L (ref 22–32)
Calcium: 8.2 mg/dL — ABNORMAL LOW (ref 8.9–10.3)
Chloride: 100 mmol/L (ref 98–111)
Creatinine, Ser: 1.75 mg/dL — ABNORMAL HIGH (ref 0.44–1.00)
GFR, Estimated: 32 mL/min — ABNORMAL LOW (ref >60)
Glucose, Bld: 151 mg/dL — ABNORMAL HIGH (ref 70–99)
Potassium: 3.6 mmol/L (ref 3.5–5.1)
Sodium: 134 mmol/L — ABNORMAL LOW (ref 135–145)

## 2021-07-19 LAB — CULTURE, BLOOD (ROUTINE X 2)
Culture: NO GROWTH
Culture: NO GROWTH
Special Requests: ADEQUATE

## 2021-07-19 LAB — HEPARIN LEVEL (UNFRACTIONATED): Heparin Unfractionated: 0.53 IU/mL (ref 0.30–0.70)

## 2021-07-19 LAB — HEPATIC FUNCTION PANEL
ALT: 119 U/L — ABNORMAL HIGH (ref 0–44)
AST: 84 U/L — ABNORMAL HIGH (ref 15–41)
Albumin: 3.3 g/dL — ABNORMAL LOW (ref 3.5–5.0)
Alkaline Phosphatase: 131 U/L — ABNORMAL HIGH (ref 38–126)
Bilirubin, Direct: 0.3 mg/dL — ABNORMAL HIGH (ref 0.0–0.2)
Indirect Bilirubin: 0.6 mg/dL (ref 0.3–0.9)
Total Bilirubin: 0.9 mg/dL (ref 0.3–1.2)
Total Protein: 6.4 g/dL — ABNORMAL LOW (ref 6.5–8.1)

## 2021-07-19 LAB — PROTIME-INR
INR: 1.3 — ABNORMAL HIGH (ref 0.8–1.2)
Prothrombin Time: 16.6 seconds — ABNORMAL HIGH (ref 11.4–15.2)

## 2021-07-19 LAB — PHOSPHORUS: Phosphorus: 2.5 mg/dL (ref 2.5–4.6)

## 2021-07-19 LAB — COOXEMETRY PANEL
Carboxyhemoglobin: 1.4 % (ref 0.5–1.5)
Methemoglobin: 1.3 % (ref 0.0–1.5)
Total hemoglobin: 14.7 g/dL (ref 12.0–16.0)

## 2021-07-19 LAB — MAGNESIUM: Magnesium: 1.7 mg/dL (ref 1.7–2.4)

## 2021-07-19 SURGERY — ECHOCARDIOGRAM, TRANSESOPHAGEAL
Anesthesia: General

## 2021-07-19 MED ORDER — BUTAMBEN-TETRACAINE-BENZOCAINE 2-2-14 % EX AERO
INHALATION_SPRAY | CUTANEOUS | Status: AC
  Filled 2021-07-19: qty 5

## 2021-07-19 MED ORDER — POTASSIUM CHLORIDE CRYS ER 20 MEQ PO TBCR
40.0000 meq | EXTENDED_RELEASE_TABLET | Freq: Once | ORAL | Status: AC
  Administered 2021-07-19: 40 meq via ORAL
  Filled 2021-07-19: qty 2

## 2021-07-19 MED ORDER — LIDOCAINE VISCOUS HCL 2 % MT SOLN
OROMUCOSAL | Status: AC
  Filled 2021-07-19: qty 15

## 2021-07-19 MED ORDER — SODIUM CHLORIDE FLUSH 0.9 % IV SOLN
INTRAVENOUS | Status: AC
  Filled 2021-07-19: qty 10

## 2021-07-19 MED ORDER — PHENYLEPHRINE HCL (PRESSORS) 10 MG/ML IV SOLN
INTRAVENOUS | Status: DC | PRN
  Administered 2021-07-19: 100 ug via INTRAVENOUS

## 2021-07-19 MED ORDER — PROPOFOL 10 MG/ML IV BOLUS
INTRAVENOUS | Status: DC | PRN
  Administered 2021-07-19: 50 mg via INTRAVENOUS
  Administered 2021-07-19 (×3): 20 mg via INTRAVENOUS

## 2021-07-19 MED ORDER — SODIUM CHLORIDE 0.9 % IV SOLN: INTRAVENOUS | Status: DC

## 2021-07-19 MED ORDER — FUROSEMIDE 10 MG/ML IJ SOLN
80.0000 mg | Freq: Two times a day (BID) | INTRAMUSCULAR | Status: DC
  Administered 2021-07-19 – 2021-07-22 (×7): 80 mg via INTRAVENOUS
  Filled 2021-07-19 (×8): qty 8

## 2021-07-19 NOTE — Progress Notes (Signed)
°   07/19/21 1035  Clinical Encounter Type  Visited With Patient and family together  Visit Type Initial;Spiritual support;Social support  Referral From Chaplain  Consult/Referral To [employer] visited with PT and established initial spiritual care. Sister in-law was at bedside. Chaplain explored various emotions with PT, offered emotional and spiritual support. PT stated staff had been great and compassionate.   Andee Poles, MDiv

## 2021-07-19 NOTE — Progress Notes (Signed)
Progress Note  Patient Name: Michele [name] Date of Encounter: 07/19/2021  [facility] Cardiologist: Rockey Situ  Subjective   No shortness of breath, chest pain, or palpitations.  Mild leg swelling noted.  Planning for TEE/DCCV with Dr. Rockey Situ today.  Inpatient Medications    Scheduled Meds:  Chlorhexidine Gluconate Cloth  6 each Topical Daily   furosemide  60 mg Intravenous Q12H   insulin aspart  0-20 Units Subcutaneous Q4H   insulin detemir  20 Units Subcutaneous Q12H   levothyroxine  200 mcg Oral Q0600   methylPREDNISolone (SOLU-MEDROL) injection  40 mg Intravenous Q12H   rosuvastatin  10 mg Oral Daily   sodium chloride flush  10-40 mL Intracatheter Q12H   Continuous Infusions:  sodium chloride Stopped (07/14/21 0344)   sodium chloride Stopped (07/18/21 1619)   amiodarone 60 mg/hr (07/19/21 0811)   heparin 1,000 Units/hr (07/19/21 0600)   milrinone 0.25 mcg/kg/min (07/19/21 0600)   norepinephrine (LEVOPHED) Adult infusion 6 mcg/min (07/19/21 0542)   PRN Meds: acetaminophen, docusate sodium, ondansetron (ZOFRAN) IV, polyethylene glycol, sodium chloride flush   Vital Signs    Vitals:   07/19/21 0400 07/19/21 0413 07/19/21 0500 07/19/21 0600  BP: 119/73  111/75 (!) 109/95  Pulse: (!) 117  92 (!) 104  Resp:   14 18  Temp: 98.3 F (36.8 C)     TempSrc: Oral     SpO2: 99%  100% 98%  Weight:  98 kg    Height:        Intake/Output Summary (Last 24 hours) at 07/19/2021 0922 Last data filed at 07/19/2021 0600 Gross per 24 hour  Intake 1732.3 ml  Output 600 ml  Net 1132.3 ml   Last 3 Weights 07/19/2021 07/18/2021 07/17/2021  Weight (lbs) 216 lb 0.8 oz 210 lb 12.2 oz 212 lb 4.9 oz  Weight (kg) 98 kg 95.6 kg 96.3 kg      Telemetry    Atrial fibrillation (rates 100-130 bpm) - Personally Reviewed  ECG    No new tracing.  Physical Exam   GEN: No acute distress.   Neck: JVP >12 cm. Cardiac: Tachycardic and irregularly irregular. Respiratory: Bibasilar  crackles. GI: Soft, nontender, non-distended  MS: Trace pretibial edema bilaterally; No deformity. Neuro:  Nonfocal  Psych: Normal affect   Labs    High Sensitivity Troponin:   Recent Labs  Lab 07/13/21 1706 07/13/21 1925  TROPONINIHS 25* 25*     Chemistry Recent Labs  Lab 07/14/21 1136 07/15/21 0511 07/16/21 0401 07/17/21 0249 07/17/21 1633 07/18/21 0000 07/18/21 0522 07/19/21 0513  NA  --    < >  --  133*   < > 130* 133* 134*  K  --    < >  --  3.4*   < > 4.5 3.6 3.6  CL  --    < >  --  100   < > 96* 99 100  CO2  --    < >  --  20*   < > 21* 24 23  GLUCOSE  --    < >  --  195*   < > 306* 146* 151*  BUN  --    < >  --  59*   < > 58* 57* 58*  CREATININE  --    < >  --  1.69*   < > 1.77* 1.66* 1.75*  CALCIUM  --    < >  --  7.9*   < > 7.9* 7.7* 8.2*  MG  --    < >  1.9 1.9  --   --   --  1.7  PROT 6.3*  --   --   --   --   --   --  6.4*  ALBUMIN  --   --   --   --   --   --   --  3.3*  AST  --   --   --   --   --   --   --  84*  ALT  --   --   --   --   --   --   --  119*  ALKPHOS  --   --   --   --   --   --   --  131*  BILITOT  --   --   --   --   --   --   --  0.9  GFRNONAA  --    < >  --  33*   < > 32* 34* 32*  ANIONGAP  --    < >  --  13   < > 13 10 11    < > = values in this interval not displayed.    Lipids No results for input(s): CHOL, TRIG, HDL, LABVLDL, LDLCALC, CHOLHDL in the last 168 hours.  Hematology Recent Labs  Lab 07/17/21 0249 07/18/21 0522 07/19/21 0513  WBC 13.8* 13.5* 14.7*  RBC 4.65 4.34 4.54  HGB 14.6 14.0 14.2  HCT 43.9 41.8 42.7  MCV 94.4 96.3 94.1  MCH 31.4 32.3 31.3  MCHC 33.3 33.5 33.3  RDW 15.2 15.5 15.6*  PLT 185 171 167   Thyroid  Recent Labs  Lab 07/13/21 1925  TSH 8.127*  FREET4 0.65    BNP Recent Labs  Lab 07/13/21 1706  BNP 738.3*    DDimer  Recent Labs  Lab 07/15/21 0511 07/16/21 0401 07/17/21 0249  DDIMER 2.00* 1.60* 3.07*     Radiology    No results found.  Cardiac Studies   TTE (07/14/2021):   1. Left ventricular ejection fraction, by estimation, is <20%. The left  ventricle has severely decreased function. The left ventricle demonstrates  global hypokinesis , inferior and inferolateral wall motion best  preserved. The left ventricular internal  cavity size was moderately dilated. Left ventricular diastolic parameters  are indeterminate.   2. Right ventricular systolic function is moderately reduced. The right  ventricular size is normal. There is normal pulmonary artery systolic  pressure.   3. Left atrial size was severely dilated.   4. Right atrial size was mildly dilated.   5. A small pericardial effusion is present.   6. The mitral valve is normal in structure. Mild to moderate mitral valve  regurgitation. No evidence of mitral stenosis.   7. The inferior vena cava is normal in size with greater than 50%  respiratory variability, suggesting right atrial pressure of 3 mmHg.   Patient Profile     66 y.o. female with previous history of nonischemic cardiomyopathy with subsequent improvement in ejection fraction, prolonged history of diabetes mellitus, hyperlipidemia and obstructive sleep apnea who presented with newly diagnosed A. fib with RVR and acute on chronic systolic heart failure.  Assessment & Plan    Acute on chronic HFrEF and cardiogenic shock: Patient still volume overloaded and hypotensive consistent with cardiogenic shock.  EF < 20% on recent echo.  SvO2 on 61% this AM.  Patient remains on low-dose norepinephrine.  I/O yesterday recorded as net positive 1.3 L  yesterday.  Renal function stable. - Continue milrinone 0.25 mcg/kg/min.   - Hopefully, restoration of sinus rhythm with TEE/DCCV today with facilitate improvement in heart failure/cardiogenic shock. - Recheck SvO2 this afternoon, a few hours after DCCV. - May need to consider transfer to [facility] for advanced heart failure management if no improvement in next day or two. - Defer adding digoxin at this  time with borderline renal function. - Unable to add GDMT at this time due to hypotension/shock. - Increase furosemide to 80 mg IV BID.  Persistent atrial fibrillation: Ventricular rates still suboptimal.  Plans already in place for TEE/DCCV with Dr. Rockey Situ today. - Decrease amiodarone infusion to 0.5 mg/min to help lower BP and cardiac-output reducing effects. - Continue IV heparin. - Proceed with TEE/DCCV today.  Coronary artery disease: 70% mid LAD stenosis noted on recent cath.  FFR/PCI deferred in the setting of acute on chronic HFrEF out of proportion to degree of CAD. - Statin therapy. - Consider functional assessment of LAD disease once patient's acute decompensated HF and a-fib with RVR has improved.  Acute kidney injury superimposed on chronic kidney disease: Renal function stable. - Continue close monitoring in the setting of increasing diuresis and persistent cardiogenic shock.  DM: Poorly controlled. - Favor adding SGLT-2 inhibitor prior to discharge once cardiogenic shock has resolved.  CRITICAL CARE Performed by: Nelva Bush, MD  Total critical care time: 35 minutes  Critical care time was exclusive of separately billable procedures and treating other patients.  Critical care was necessary to treat or prevent imminent or life-threatening deterioration.  Critical care was time spent personally by me (independent of midlevel providers or residents) on the following activities: development of treatment plan with patient and/or surrogate as well as nursing, discussions with consultants, evaluation of patient's response to treatment, examination of patient, obtaining history from patient or surrogate, ordering and performing treatments and interventions, ordering and review of laboratory studies, ordering and review of radiographic studies, pulse oximetry and re-evaluation of patient's condition.    For questions or updates, please contact [place] Please  consult [email or web address] for contact info under [facility].     Signed, Nelva Bush, MD  07/19/2021, 9:22 AM

## 2021-07-19 NOTE — Transfer of Care (Signed)
Immediate Anesthesia Transfer of Care Note  Patient: Ori Kreiter  Procedure(s) Performed: TRANSESOPHAGEAL ECHOCARDIOGRAM (TEE)  Patient Location: PACU and ICU  Anesthesia Type:General  Level of Consciousness: drowsy  Airway & Oxygen Therapy: Patient Spontanous Breathing and Patient connected to nasal cannula oxygen  Post-op Assessment: Report given to RN and Post -op Vital signs reviewed and stable  Post vital signs: Reviewed and stable  Last Vitals:  Vitals Value Taken Time  BP 92/54 07/19/21 1312  Temp    Pulse 68 07/19/21 1312  Resp 14 07/19/21 1312  SpO2 100 % 07/19/21 1312  Vitals shown include unvalidated device data.  Last Pain:  Vitals:   07/19/21 1200  TempSrc: Oral  PainSc: 0-No pain      Patients Stated Pain Goal: 0 (92/42/68 3419)  Complications: No notable events documented.

## 2021-07-19 NOTE — Anesthesia Preprocedure Evaluation (Addendum)
Anesthesia Evaluation  Patient identified by MRN, date of birth, ID band Patient awake    Reviewed: Allergy & Precautions, NPO status , Patient's Chart, lab work & pertinent test results  Airway Mallampati: II  TM Distance: >3 FB Neck ROM: Full    Dental no notable dental hx.    Pulmonary shortness of breath and with exertion, sleep apnea and Continuous Positive Airway Pressure Ventilation , pneumonia,    Pulmonary exam normal breath sounds clear to auscultation       Cardiovascular Exercise Tolerance: Poor hypertension, Pt. on medications (-) angina+ CAD and +CHF  + dysrhythmias  Rhythm:Irregular Rate:Tachycardia + Peripheral Edema    Neuro/Psych negative neurological ROS  negative psych ROS   GI/Hepatic Neg liver ROS, GERD  Controlled,  Endo/Other  diabetes (A1c), Poorly Controlled, Type 2, Insulin DependentHypothyroidism   Renal/GU Acute kidney injury superimposed on chronic kidney disease  negative genitourinary   Musculoskeletal negative musculoskeletal ROS (+)   Abdominal (+) + obese,   Peds negative pediatric ROS (+)  Hematology negative hematology ROS (+)   Anesthesia Other Findings 66 y.o. female with previous history of nonischemic cardiomyopathy with subsequent improvement in ejection fraction, prolonged history of diabetes mellitus, hyperlipidemia and obstructive sleep apnea who presented with newly diagnosed A. fib with RVR and acute on chronic systolic heart failure.  Pt on hep gtt, amio gtt.  Coronary artery disease: 70% mid LAD stenosis noted on recent cath.  FFR/PCI deferred in the setting of acute on chronic HFrEF out of proportion to degree of CAD  TTE (07/14/2021): 1. Left ventricular ejection fraction, by estimation, is <20%. The left  ventricle has severely decreased function. The left ventricle demonstrates  global hypokinesis , inferior and inferolateral wall motion best  preserved. The  left ventricular internal  cavity size was moderately dilated. Left ventricular diastolic parameters  are indeterminate.  2. Right ventricular systolic function is moderately reduced. The right  ventricular size is normal. There is normal pulmonary artery systolic  pressure.  3. Left atrial size was severely dilated.  4. Right atrial size was mildly dilated.  5. A small pericardial effusion is present.  6. The mitral valve is normal in structure. Mild to moderate mitral valve  regurgitation. No evidence of mitral stenosis.  7. The inferior vena cava is normal in size with greater than 50%  respiratory variability, suggesting right atrial pressure of 3 mmHg  Reproductive/Obstetrics negative OB ROS                          Anesthesia Physical Anesthesia Plan  ASA: 4  Anesthesia Plan: General   Post-op Pain Management:    Induction: Intravenous  PONV Risk Score and Plan: TIVA and Treatment may vary due to age or medical condition  Airway Management Planned: Natural Airway and Nasal Cannula  Additional Equipment:   Intra-op Plan:   Post-operative Plan: Extubation in OR  Informed Consent: I have reviewed the patients History and Physical, chart, labs and discussed the procedure including the risks, benefits and alternatives for the proposed anesthesia with the patient or authorized representative who has indicated his/her understanding and acceptance.     Dental advisory given  Plan Discussed with: CRNA and Anesthesiologist  Anesthesia Plan Comments:        Anesthesia Quick Evaluation

## 2021-07-19 NOTE — CV Procedure (Signed)
Cardioversion procedure note °For atrial fibrillation, persistent ° °Procedure Details: ° °Consent: Risks of procedure as well as the alternatives and risks of each were explained to the (patient/caregiver).  Consent for procedure obtained. ° °Time Out: Verified patient identification, verified procedure, site/side was marked, verified correct patient position, special equipment/implants available, medications/allergies/relevent history reviewed, required imaging and test results available.  Performed ° °Patient placed on cardiac monitor, pulse oximetry, supplemental oxygen as necessary.   °Sedation given: propofol IV, Dr. Johnson °Pacer pads placed anterior and posterior chest. ° ° °Cardioverted 1 time(s).   °Cardioverted at  150 J. Synchronized biphasic °Converted to NSR ° ° °Evaluation: °Findings: Post procedure EKG shows: NSR °Complications: None °Patient did tolerate procedure well. ° °Time Spent Directly with the Patient: ° °45 minutes  ° °Tim Hazelynn Mckenny, M.D., Ph.D.  °

## 2021-07-19 NOTE — Progress Notes (Signed)
Inpatient Diabetes Program Recommendations  AACE/ADA: New Consensus Statement on Inpatient Glycemic Control   Target Ranges:  Prepandial:   less than 140 mg/dL      Peak postprandial:   less than 180 mg/dL (1-2 hours)      Critically ill patients:  140 - 180 mg/dL    Latest Reference Range & Units 07/18/21 07:26 07/18/21 11:34 07/18/21 15:47 07/18/21 19:19 07/18/21 23:16 07/19/21 04:07 07/19/21 07:08  Glucose-Capillary 70 - 99 mg/dL 152 (H) 254 (H) 370 (H) 369 (H) 307 (H) 173 (H) 134 (H)    Latest Reference Range & Units 11/19/19 11:43 06/22/21 14:08  Hemoglobin A1C 4.0 - 5.6 % 9.8 (H) 10.0 !   Review of Glycemic Control  Diabetes history: DM2 Outpatient Diabetes medications: Lantus 50 units BID Current orders for Inpatient glycemic control: Levemir 20 units Q12H, Novolog 0-20 units Q4H, Solumedrol 40 mg Q12H  Inpatient Diabetes Program Recommendations:    Insulin: Noted Levemir increased from 15 to 20 units Q12H on 07/18/21.  If steroids are continued and glucose remains consistently over 180 mg/dl, please consider increasing Levemir to 25 units Q12H. Once diet is resumed, please consider ordering Novolog 4 units TID with meals for meal coverage if patient eats at least 50% of meals.  HbgA1C:  A1C 10% on 06/22/21. Inpatient diabetes coordinator spoke with patient on 07/14/21 (during current admission).  Thanks, Barnie Alderman, RN, MSN, CDE Diabetes Coordinator Inpatient Diabetes Program [phone] (Team Pager from 8am to 5pm)

## 2021-07-19 NOTE — TOC Progression Note (Signed)
Transition of Care [facility]) - Progression Note    Patient Details  Name: Ioma Chismar MRN: [id number] Date of Birth: [birth year]  Transition of [facility] CM/SW [place], RN Phone Number: 07/19/2021, 4:36 PM  Clinical Narrative:  Spoke with patient about PT/OT recommendation for HHPT/OT. Patient consents to [facility] services and has no preference at this time. She is also interested in meal prep. Service, Friend in the room says she will look into it and assist patient with that. Corene Cornea from [facility] notified and agrees to provide HHPT/OT services when medically stable for discharge.          Expected Discharge Plan and Services                                                 Social Determinants of Health (SDOH) Interventions    Readmission Risk Interventions No flowsheet data found.

## 2021-07-19 NOTE — Progress Notes (Signed)
Acute on chronic HFrEF and cardiogenic shock: Patient still volume overloaded and hypotensive consistent with cardiogenic shock.   EF < 20% on recent echo.   SvO2 on 55-->73--61%   Patient remains on low-dose norepinephrine.  I/O yesterday recorded as net positive 1.3 L yesterday.  Renal function stable.  CARDIOLOGY RECS - Continue milrinone 0.25 mcg/kg/min.   -S/ P TEE/DCCV to  improve heart failure/cardiogenic shock. - Recheck SvO2 this afternoon, a few hours after DCCV. - May need to consider transfer to [facility] for advanced heart failure management if no improvement in next day or two. - Increase furosemide to 80 mg IV BID.   Persistent atrial fibrillation: - Decrease amiodarone infusion to 0.5 mg/min  - Continue IV heparin.    Coronary artery disease: 70% mid LAD stenosis noted on recent cath.  FFR/PCI deferred in the setting of acute on chronic HFrEF out of proportion to degree of CAD. - Statin therapy. - Consider functional assessment of LAD disease once patient's acute decompensated HF and a-fib with RVR has improved.   Acute kidney injury superimposed on chronic kidney disease: Renal function stable. - Continue close monitoring in the setting of increasing diuresis and persistent cardiogenic shock.     Corrin Parker, M.D.  [facility]  Medical Director [place] Director [facility] Cardio-Pulmonary Department

## 2021-07-19 NOTE — Progress Notes (Signed)
TEE with cardioversion performed at bedside after timeout, drips continue as charted. Pt has signs of distress, levophed restarted post procedure for low BP. Family at bedside updated.

## 2021-07-19 NOTE — Progress Notes (Signed)
*  PRELIMINARY RESULTS* Echocardiogram 2D Echocardiogram has been performed.  Michele [name] 07/19/2021, 1:29 PM

## 2021-07-19 NOTE — Progress Notes (Signed)
OT Cancellation Note  Patient Details Name: Michele [name] MRN: [id number] DOB: 12-Apr-1956   Cancelled Treatment:    Reason Eval/Treat Not Completed: Other (comment). Per chart, patient to have TEE/DCCV procedure today. OT will hold and follow up as appropriate.   Ardeth Perfect., MPH, MS, OTR/L ascom [phone] 07/19/21, 1:13 PM

## 2021-07-19 NOTE — Anesthesia Postprocedure Evaluation (Signed)
Anesthesia Post Note  Patient: Michele [name]  Procedure(s) Performed: TRANSESOPHAGEAL ECHOCARDIOGRAM (TEE)  Patient location during evaluation: ICU Anesthesia Type: General Level of consciousness: awake and alert Pain management: pain level controlled Vital Signs Assessment: post-procedure vital signs reviewed and stable Respiratory status: spontaneous breathing, nonlabored ventilation, respiratory function stable and patient connected to nasal cannula oxygen Cardiovascular status: blood pressure returned to baseline and stable Postop Assessment: no apparent nausea or vomiting Anesthetic complications: no Comments: Pt stable on her initial drips without changes   No notable events documented.   Last Vitals:  Vitals:   07/19/21 1445 07/19/21 1500  BP: 100/60 96/62  Pulse: 71 74  Resp: 13 (!) 21  Temp:    SpO2: 100% 100%    Last Pain:  Vitals:   07/19/21 1200  TempSrc: Oral  PainSc: 0-No pain                 Iran Ouch

## 2021-07-19 NOTE — Progress Notes (Incomplete)
*  PRELIMINARY RESULTS* Echocardiogram Echocardiogram Transesophageal has been performed.  Sherrie Sport 07/19/2021, 1:30 PM

## 2021-07-19 NOTE — Consult Note (Signed)
PHARMACY CONSULT NOTE - FOLLOW UP  Pharmacy Consult for Electrolyte Monitoring and Replacement   Recent Labs: Potassium (mmol/L)  Date Value  07/19/2021 3.6   Magnesium (mg/dL)  Date Value  07/19/2021 1.7   Calcium (mg/dL)  Date Value  07/19/2021 8.2 (L)   Albumin (g/dL)  Date Value  07/19/2021 3.3 (L)  06/22/2021 4.6   Phosphorus (mg/dL)  Date Value  07/19/2021 2.5   Sodium (mmol/L)  Date Value  07/19/2021 134 (L)  06/22/2021 138     Assessment: 66 y.o female with significant PMH of OSA on CPAP, hypothyroidism, DM, HTN, CHF, DCIS, neutrophilia, HLD, CAD, who presented to the ED with chief complaints of  SOB x1 week, with worsening respiratory status. CT concerning for possible PE. Pharmacy consulted for management of electrolytes.  Patient with new onset afib on amiodarone and heparin infusions. Plan for cardioversion 1/10.  Also with acute on chronic systolic heart failure with cardiogenic shock in the setting of above. EF 10-15%. She is in the ICU on milrinone infusion. Diuresis increased to Lasix IV 80 mg BID.   Goal of Therapy:  Electrolytes WNL  Plan:  --Given ongoing diuresis, will give potassium 40 mEq PO this evening --Follow up with morning labs  Tawnya Crook, PharmD, BCPS Clinical Pharmacist 07/19/2021 12:43 PM

## 2021-07-19 NOTE — Consult Note (Signed)
[place] for heparin infusion Indication: afib / ? PE  No Known Allergies  Patient Measurements: Height: 5\' 2"  (157.5 cm) Weight: 98 kg (216 lb 0.8 oz) IBW/kg (Calculated) : 50.1 Heparin Dosing Weight: 71.6 kg  Vital Signs: Temp: 98.3 F (36.8 C) (01/10 0400) Temp Source: Oral (01/10 0400) BP: 109/95 (01/10 0600) Pulse Rate: 104 (01/10 0600)  Labs: Recent Labs    07/17/21 0249 07/17/21 1217 07/17/21 1633 07/18/21 0000 07/18/21 0522 07/19/21 0513  HGB 14.6  --   --   --  14.0 14.2  HCT 43.9  --   --   --  41.8 42.7  PLT 185  --   --   --  171 167  HEPARINUNFRC 0.32 0.33  --   --  0.39 0.53  CREATININE 1.69*  --    < > 1.77* 1.66* 1.75*   < > = values in this interval not displayed.     Estimated Creatinine Clearance: 35.1 mL/min (A) (by C-G formula based on SCr of 1.75 mg/dL (H)).   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   CHF (congestive heart failure) ([place])    Coronary artery disease 2009   DCIS (ductal carcinoma in situ) of breast, right 2012   Diabetes mellitus ([facility])    Type II   GERD (gastroesophageal reflux disease)    Hyperlipidemia    Hypertension    Ischemic cardiomyopathy    Obesity, unspecified    Personal history of radiation therapy    Sleep apnea    Thyroid disease    hypothyroidism   Umbilical hernia without mention of obstruction or gangrene     Medications:  No prior AC noted  Heparin Dosing Weight: 71.6 kg  Assessment: 66 y.o female with significant PMH of OSA on CPAP, hypothyroidism, diabetes mellitus, hypertension, CHF, DCIS, neutrophilia, hyperlipidemia, CAD, who presented to the ED with chief complaints of  shortness of breath x1 week with  worsening respiratory status. CT concerning for possible PE. Patient with new onset afib this admission.  Date Time HL Rate/Comment 1/5 1928 1.1  Supra; 1200 > 1000 un/hr 1/6 0511 0.79 Supra; 1000 > 900 un/hr 1/6 heparin stopped at 0600; plan to resume 900 un/hr at 1700 per cards  1/6 2359 0.25 Sub 900> 950   1/7 0931  0.43  Therapeutic 1/7 1603 0.49 Therapeutic  1/8 0249 0.32 Therapeutic  1/8 1217 0.33 Therapeutic 1/9 0522 0.39 Therapeutic 1/10 0513 0.53 Therapeutic    Goal of Therapy:  Heparin level 0.3-0.7 units/ml Monitor platelets by anticoagulation protocol: Yes   Plan:  --Heparin level therapeutic --Continue heparin infusion at 1000 units/hr --Check HL and CBC daily while on heparin  Tawnya Crook, PharmD, BCPS Clinical Pharmacist   07/19/2021,7:19 AM

## 2021-07-19 NOTE — Progress Notes (Addendum)
PT Cancellation Note  Patient Details Name: Michele [name] MRN: [id number] DOB: [birth year]   Cancelled Treatment:    Reason Eval/Treat Not Completed: Patient at procedure or test/unavailable Patient having TEE procedure today. PT will hold and follow up as appropriate.    Minna Merritts, PT, MPT  Percell Locus 07/19/2021, 1:02 PM

## 2021-07-19 NOTE — Progress Notes (Signed)
Transesophageal Echocardiogram :  Indication: Persistent atrial fibrillation Requesting/ordering  physician: Dr. Rod Can  Procedure: Benzocaine spray x2 and 2 mls x 2 of viscous lidocaine were given orally to provide local anesthesia to the oropharynx. The patient was positioned supine on the left side, bite block provided. The patient was moderately sedated with the doses of versed and fentanyl as detailed below.  Using digital technique an omniplane probe was advanced into the distal esophagus without incident.   Moderate sedation: 1. Sedation used: Managed by anesthesia, Dr. Wynetta Emery  See report in [facility]  for complete details: In brief, transgastric imaging revealed normal LV function with no RWMAs and no mural apical thrombus.  .  Estimated ejection fraction was 20%.  Global hypokinesis. right sided cardiac chambers were normal with no evidence of pulmonary hypertension.    Severely dilated left atrium Mild to moderate MR Imaging of the septum showed no ASD or VSD Bubble study was negative for shunt 2D and color flow confirmed no PFO  The LA was well visualized in orthogonal views.  There was no spontaneous contrast and no thrombus in the LA and LA appendage   The descending thoracic aorta had no  mural aortic debris with no evidence of aneurysmal dilation or disection  Cardioversion to follow  Ida Rogue 07/19/2021 1:24 PM

## 2021-07-20 ENCOUNTER — Encounter
Admission: EM | Disposition: A | Discharge status: Home Health | Payer: Self-pay | Source: Home / Self Care | Attending: Pulmonary Disease

## 2021-07-20 ENCOUNTER — Encounter: Payer: Self-pay | Admitting: Cardiovascular Disease

## 2021-07-20 DIAGNOSIS — I4891 Unspecified atrial fibrillation: Secondary | ICD-10-CM | POA: Diagnosis not present

## 2021-07-20 DIAGNOSIS — I5021 Acute systolic (congestive) heart failure: Secondary | ICD-10-CM | POA: Diagnosis not present

## 2021-07-20 DIAGNOSIS — J9601 Acute respiratory failure with hypoxia: Secondary | ICD-10-CM

## 2021-07-20 LAB — CBC
HCT: 42.5 % (ref 36.0–46.0)
Hemoglobin: 14.4 g/dL (ref 12.0–15.0)
MCH: 32.1 pg (ref 26.0–34.0)
MCHC: 33.9 g/dL (ref 30.0–36.0)
MCV: 94.7 fL (ref 80.0–100.0)
Platelets: 111 10*3/uL — ABNORMAL LOW (ref 150–400)
RBC: 4.49 MIL/uL (ref 3.87–5.11)
RDW: 15.4 % (ref 11.5–15.5)
WBC: 11.2 10*3/uL — ABNORMAL HIGH (ref 4.0–10.5)
nRBC: 0 % (ref 0.0–0.2)

## 2021-07-20 LAB — BASIC METABOLIC PANEL
Anion gap: 10 (ref 5–15)
BUN: 57 mg/dL — ABNORMAL HIGH (ref 8–23)
CO2: 25 mmol/L (ref 22–32)
Calcium: 8 mg/dL — ABNORMAL LOW (ref 8.9–10.3)
Chloride: 101 mmol/L (ref 98–111)
Creatinine, Ser: 1.7 mg/dL — ABNORMAL HIGH (ref 0.44–1.00)
GFR, Estimated: 33 mL/min — ABNORMAL LOW (ref >60)
Glucose, Bld: 220 mg/dL — ABNORMAL HIGH (ref 70–99)
Potassium: 3.3 mmol/L — ABNORMAL LOW (ref 3.5–5.1)
Sodium: 136 mmol/L (ref 135–145)

## 2021-07-20 LAB — GLUCOSE, CAPILLARY
Glucose-Capillary: 148 mg/dL — ABNORMAL HIGH (ref 70–99)
Glucose-Capillary: 198 mg/dL — ABNORMAL HIGH (ref 70–99)
Glucose-Capillary: 202 mg/dL — ABNORMAL HIGH (ref 70–99)
Glucose-Capillary: 205 mg/dL — ABNORMAL HIGH (ref 70–99)
Glucose-Capillary: 228 mg/dL — ABNORMAL HIGH (ref 70–99)
Glucose-Capillary: 252 mg/dL — ABNORMAL HIGH (ref 70–99)
Glucose-Capillary: 96 mg/dL (ref 70–99)

## 2021-07-20 LAB — COOXEMETRY PANEL
Carboxyhemoglobin: 1 % (ref 0.5–1.5)
Methemoglobin: 0.7 % (ref 0.0–1.5)

## 2021-07-20 LAB — HEPARIN LEVEL (UNFRACTIONATED): Heparin Unfractionated: 0.7 IU/mL (ref 0.30–0.70)

## 2021-07-20 SURGERY — CARDIOVERSION
Anesthesia: General

## 2021-07-20 MED ORDER — POTASSIUM CHLORIDE CRYS ER 20 MEQ PO TBCR
40.0000 meq | EXTENDED_RELEASE_TABLET | Freq: Two times a day (BID) | ORAL | Status: AC
  Administered 2021-07-20 (×2): 40 meq via ORAL
  Filled 2021-07-20 (×2): qty 2

## 2021-07-20 MED ORDER — AMIODARONE HCL 200 MG PO TABS
400.0000 mg | ORAL_TABLET | Freq: Two times a day (BID) | ORAL | Status: DC
  Administered 2021-07-20 – 2021-07-23 (×8): 400 mg via ORAL
  Filled 2021-07-20 (×9): qty 2

## 2021-07-20 NOTE — Progress Notes (Addendum)
Per md I was able to turn milrinone gtt off. I turned it off at 1600, pt has remained stable and heart rate in NSR. BP has remained WNL, will continue to monitor.

## 2021-07-20 NOTE — Consult Note (Signed)
PHARMACY CONSULT NOTE - FOLLOW UP  Pharmacy Consult for Electrolyte Monitoring and Replacement   Recent Labs: Potassium (mmol/L)  Date Value  07/20/2021 3.3 (L)   Magnesium (mg/dL)  Date Value  07/19/2021 1.7   Calcium (mg/dL)  Date Value  07/20/2021 8.0 (L)   Albumin (g/dL)  Date Value  07/19/2021 3.3 (L)  06/22/2021 4.6   Phosphorus (mg/dL)  Date Value  07/19/2021 2.5   Sodium (mmol/L)  Date Value  07/20/2021 136  06/22/2021 138     Assessment: 66 y.o female with significant PMH of OSA on CPAP, hypothyroidism, DM, HTN, CHF, DCIS, neutrophilia, HLD, CAD, who presented to the ED with chief complaints of  SOB x1 week, with worsening respiratory status. CT concerning for possible PE. Pharmacy consulted for management of electrolytes.  Patient with new onset afib on amiodarone and heparin infusions. Patient cardioverted 1/10.  Also with acute on chronic systolic heart failure with cardiogenic shock in the setting of above. EF 10-15%. She is in the ICU on a milrinone infusion. Diuresis increased to Lasix IV 80 mg BID.  Goal of Therapy:  Electrolytes WNL  Plan:  --Given ongoing diuresis, potassium 40 mEq x 2 doses --Follow up with morning labs  Tawnya Crook, PharmD, BCPS Clinical Pharmacist 07/20/2021 11:29 AM

## 2021-07-20 NOTE — Progress Notes (Signed)
Physical Therapy Treatment Patient Details Name: Michele [name] MRN: [id number] DOB: 1956/02/23 Today's Date: 07/20/2021   History of Present Illness 66 y.o female with significant PMH of OSA on CPAP, hypothyroidism, diabetes mellitus, hypertension, CHF, DCIS, neutrophilia, hyperlipidemia, CAD, who presented to the ED with chief complaints of  shortness of breath x1 week. Pt being tx'ed for Acute Hypoxic Respiratory Failure secondary to Pulmonary Edema, Underlying Pneumonia and possible PE, started on heparin gtt. Pt s/p cath on 1/6, improved status on 1/7 and approved for therapy evaluation 24h post-start of amiodarone.    PT Comments    Pt received sitting in recliner, visitor in room and agreeable to therapy. Pt is progressing well with mobility - she required SUP for STS and ambulation (CGA last session). She also increased ambulation distance from 17ft to 277ft. RW continues to be used. Pt reported SOB during and after ambulation. Vitals remained stable throughout. SpO2 90-100% on RA w/ VC to slow breathing and increase depth when at 90%. RR did increase to 33 however remained in the mid-20's more majority of walk. HR in mid-80's. Deficits remain in strength and endurance. Would benefit from skilled PT to address above deficits and promote optimal return to PLOF.    Recommendations for follow up therapy are one component of a multi-disciplinary discharge planning process, led by the attending physician.  Recommendations may be updated based on patient status, additional functional criteria and insurance authorization.  Follow Up Recommendations  Home health PT     Assistance Recommended at Discharge Frequent or constant Supervision/Assistance  Patient can return home with the following A little help with walking and/or transfers;A little help with bathing/dressing/bathroom;Assistance with cooking/housework;Assist for transportation;Help with stairs or ramp for entrance   Equipment  Recommendations  None recommended by PT    Recommendations for Other Services       Precautions / Restrictions Precautions Precautions: Fall Restrictions Weight Bearing Restrictions: No     Mobility  Bed Mobility Overal bed mobility: Needs Assistance Bed Mobility: Supine to Sit     Supine to sit: Min guard;HOB elevated     General bed mobility comments: Not observed - in recliner at beginning and end of session    Transfers Overall transfer level: Needs assistance Equipment used: Rolling walker (2 wheels) Transfers: Sit to/from Stand Sit to Stand: Supervision Stand pivot transfers: Min guard;Supervision         General transfer comment: Close SUP for safety during STS from recliner. VC on hand position.    Ambulation/Gait Ambulation/Gait assistance: Supervision Gait Distance (Feet): 200 Feet Assistive device: Rolling walker (2 wheels) Gait Pattern/deviations: Decreased stride length       General Gait Details: 2102ft using RW; close SUP for safety, no steadying assist required. Decreased gait velocity and cadence.   Stairs             Wheelchair Mobility    Modified Rankin (Stroke Patients Only)       Balance Overall balance assessment: Needs assistance Sitting-balance support: Feet supported Sitting balance-Leahy Scale: Good Sitting balance - Comments: in recliner   Standing balance support: During functional activity;Bilateral upper extremity supported;Reliant on assistive device for balance Standing balance-Leahy Scale: Fair Standing balance comment: Relies on RW during ambulation. Did not require additional steadying by PT.                            Cognition Arousal/Alertness: Awake/alert Behavior During Therapy: WFL for tasks assessed/performed Overall Cognitive  Status: Within Functional Limits for tasks assessed                                          Exercises Other Exercises Other Exercises: OT  engage pt in standing ADLs including peri care with MIN A and hand hygiene with SETUP/SUPV with RW for standing balance. Pt tolerates fxl mobility to commode with RW ~15' and then 10' to recliner after ~3 min stand for ADL completion. She tolerates session well. HR slightly increased to 117bpm with fxl mobility, but pt overall stable throughout on RA (spO2 >94% throughout).    General Comments        Pertinent Vitals/Pain Pain Assessment: No/denies pain    Home Living                          Prior Function            PT Goals (current goals can now be found in the care plan section) Acute Rehab PT Goals Patient Stated Goal: get better, go home PT Goal Formulation: With patient Time For Goal Achievement: 07/31/21 Potential to Achieve Goals: Good    Frequency    Min 2X/week      PT Plan      Co-evaluation              AM-PAC PT "6 Clicks" Mobility   Outcome Measure  Help needed turning from your back to your side while in a flat bed without using bedrails?: A Little Help needed moving from lying on your back to sitting on the side of a flat bed without using bedrails?: A Little Help needed moving to and from a bed to a chair (including a wheelchair)?: A Little Help needed standing up from a chair using your arms (e.g., wheelchair or bedside chair)?: A Little Help needed to walk in hospital room?: A Little Help needed climbing 3-5 steps with a railing? : A Lot 6 Click Score: 17    End of Session Equipment Utilized During Treatment: Gait belt Activity Tolerance: Patient tolerated treatment well Patient left: in chair;with family/visitor present;with call bell/phone within reach Nurse Communication: Mobility status PT Visit Diagnosis: Unsteadiness on feet (R26.81);Muscle weakness (generalized) (M62.81);Difficulty in walking, not elsewhere classified (R26.2)     Time: 2229-7989 PT Time Calculation (min) (ACUTE ONLY): 16 min  Charges:  $Therapeutic  Exercise: 8-22 mins                     Patrina Levering PT, DPT 07/20/21 1:02 PM [phone]

## 2021-07-20 NOTE — Progress Notes (Signed)
Progress Note  Patient Name: Michele [name] Date of Encounter: 07/20/2021  [facility] Cardiologist: Dr. Rockey Situ  Subjective   Shortness of breath much improved, ambulated earlier with physical therapy.  Denies chest pain .  Inpatient Medications    Scheduled Meds:  amiodarone  400 mg Oral BID   furosemide  80 mg Intravenous BID   insulin aspart  0-20 Units Subcutaneous Q4H   insulin detemir  20 Units Subcutaneous Q12H   levothyroxine  200 mcg Oral Q0600   potassium chloride  40 mEq Oral BID   rosuvastatin  10 mg Oral Daily   sodium chloride flush  10-40 mL Intracatheter Q12H   Continuous Infusions:  sodium chloride Stopped (07/18/21 1619)   heparin 1,000 Units/hr (07/20/21 1400)   milrinone 0.125 mcg/kg/min (07/20/21 1400)   PRN Meds: acetaminophen, docusate sodium, ondansetron (ZOFRAN) IV, polyethylene glycol, sodium chloride flush   Vital Signs    Vitals:   07/20/21 0700 07/20/21 0800 07/20/21 1300 07/20/21 1400  BP: 90/65 99/66 109/77 107/79  Pulse: 63 67 85 72  Resp: 11 17 17 18   Temp:  (!) 96.9 F (36.1 C)  98 F (36.7 C)  TempSrc:  Axillary  Oral  SpO2: 100% 99% 96% 100%  Weight:      Height:        Intake/Output Summary (Last 24 hours) at 07/20/2021 1714 Last data filed at 07/20/2021 1400 Gross per 24 hour  Intake 1493.54 ml  Output 2300 ml  Net -806.46 ml   Last 3 Weights 07/20/2021 07/19/2021 07/18/2021  Weight (lbs) 211 lb 3.2 oz 216 lb 0.8 oz 210 lb 12.2 oz  Weight (kg) 95.8 kg 98 kg 95.6 kg      Telemetry    Sinus rhythm, occasional PVCs- Personally Reviewed  ECG     - Personally Reviewed  Physical Exam   GEN: No acute distress.   Neck: No JVD Cardiac: RRR, no murmurs  Respiratory: Decreased breath sounds at bases GI: Soft, nontender, non-distended  MS: trace-1+ edema; No deformity. Neuro:  Nonfocal  Psych: Normal affect   Labs    High Sensitivity Troponin:   Recent Labs  Lab 07/13/21 1706 07/13/21 1925  TROPONINIHS 25*  25*     Chemistry Recent Labs  Lab 07/14/21 1136 07/15/21 0511 07/16/21 0401 07/17/21 0249 07/17/21 1633 07/18/21 0522 07/19/21 0513 07/20/21 0450  NA  --    < >  --  133*   < > 133* 134* 136  K  --    < >  --  3.4*   < > 3.6 3.6 3.3*  CL  --    < >  --  100   < > 99 100 101  CO2  --    < >  --  20*   < > 24 23 25   GLUCOSE  --    < >  --  195*   < > 146* 151* 220*  BUN  --    < >  --  59*   < > 57* 58* 57*  CREATININE  --    < >  --  1.69*   < > 1.66* 1.75* 1.70*  CALCIUM  --    < >  --  7.9*   < > 7.7* 8.2* 8.0*  MG  --    < > 1.9 1.9  --   --  1.7  --   PROT 6.3*  --   --   --   --   --  6.4*  --   ALBUMIN  --   --   --   --   --   --  3.3*  --   AST  --   --   --   --   --   --  84*  --   ALT  --   --   --   --   --   --  119*  --   ALKPHOS  --   --   --   --   --   --  131*  --   BILITOT  --   --   --   --   --   --  0.9  --   GFRNONAA  --    < >  --  33*   < > 34* 32* 33*  ANIONGAP  --    < >  --  13   < > 10 11 10    < > = values in this interval not displayed.    Lipids No results for input(s): CHOL, TRIG, HDL, LABVLDL, LDLCALC, CHOLHDL in the last 168 hours.  Hematology Recent Labs  Lab 07/18/21 0522 07/19/21 0513 07/20/21 0834  WBC 13.5* 14.7* 11.2*  RBC 4.34 4.54 4.49  HGB 14.0 14.2 14.4  HCT 41.8 42.7 42.5  MCV 96.3 94.1 94.7  MCH 32.3 31.3 32.1  MCHC 33.5 33.3 33.9  RDW 15.5 15.6* 15.4  PLT 171 167 111*   Thyroid  Recent Labs  Lab 07/13/21 1925  TSH 8.127*  FREET4 0.65    BNPNo results for input(s): BNP, PROBNP in the last 168 hours.  DDimer  Recent Labs  Lab 07/15/21 0511 07/16/21 0401 07/17/21 0249  DDIMER 2.00* 1.60* 3.07*     Radiology    No results found.  Cardiac Studies   TTE 07/14/2021 1. Left ventricular ejection fraction, by estimation, is <20%. The left  ventricle has severely decreased function. The left ventricle demonstrates  global hypokinesis , inferior and inferolateral wall motion best  preserved. The left  ventricular internal  cavity size was moderately dilated. Left ventricular diastolic parameters  are indeterminate.   2. Right ventricular systolic function is moderately reduced. The right  ventricular size is normal. There is normal pulmonary artery systolic  pressure.   3. Left atrial size was severely dilated.   4. Right atrial size was mildly dilated.   5. A small pericardial effusion is present.   6. The mitral valve is normal in structure. Mild to moderate mitral valve  regurgitation. No evidence of mitral stenosis.   7. The inferior vena cava is normal in size with greater than 50%  respiratory variability, suggesting right atrial pressure of 3 mmHg.  Patient Profile     66 y.o. female with history of CAD, diabetes, hyperlipidemia, nonischemic cardiomyopathy EF 20%, presenting with shortness of breath x1 week, diagnosed with A. fib RVR and acute CHF.  Assessment & Plan    Nonischemic cardiomyopathy, EF 20% -Diuresing, edema improving, net -1.2 L, creatinine stable at 1.7. -Decrease milrinone to 0.125.  Further titration as BP permits -Continue Lasix to 80 mg IV twice daily.  Consider decrease if BP reduces or creatinine gets worse -Monitor ins and out, monitor creatinine. -Low blood pressures preventing addition of GDMT at this time.  2.  A. fib RVR -S/p DC cardioversion -Maintaining sinus rhythm.  Start p.o. amiodarone 400 mg twice daily x7 days -Reduce amiodarone 200 mg twice daily after 7 days. -Heparin drip,  NOAC upon discharge.  3.  CAD, 70% mid LAD on recent cath -Crestor, on heparin -NOAC upon discharge  Critical care time was exclusive of separate billable procedures and treating other patients.  Critial care time was spent personally by me (independant of midlevel providers) on the following activities: development of treatment plan,  evaluation of patients response to treatment, examining patient, reviewing treatments/ interventions, lab studies.      The  patient is critically ill with multiple organ systems failure and requires high complexity decision making for assessment and support, frequent evaluation and titration of therapies.   Critical care was necessary to treat or prevent immintent or life-threatening deterioration.   Total CCT spent directly with the patient today is 30       Signed, Kate Sable, MD  07/20/2021, 5:14 PM

## 2021-07-20 NOTE — Progress Notes (Signed)
Inpatient Diabetes Program Recommendations  AACE/ADA: New Consensus Statement on Inpatient Glycemic Control   Target Ranges:  Prepandial:   less than 140 mg/dL      Peak postprandial:   less than 180 mg/dL (1-2 hours)      Critically ill patients:  140 - 180 mg/dL    Latest Reference Range & Units 07/20/21 00:18 07/20/21 04:27 07/20/21 07:18  Glucose-Capillary 70 - 99 mg/dL 252 (H)  Novolog 11 units 198 (H)  Novolog 5 units 148 (H)  Novolog3 units    Latest Reference Range & Units 07/19/21 07:08 07/19/21 11:13 07/19/21 15:52 07/19/21 19:35  Glucose-Capillary 70 - 99 mg/dL 134 (H)  Novolog 3 units  Levemir 20 units 140 (H)  Novolog 3 units 144 (H)  Novolog 3 units 265 (H)  Novolog 11 units  Levemir 20 units   Review of Glycemic Control  Diabetes history: DM2 Outpatient Diabetes medications: Lantus 50 units BID Current orders for Inpatient glycemic control: Levemir 20 units Q12H, Novolog 0-20 units Q4H, Solumedrol 40 mg Q12H   Inpatient Diabetes Program Recommendations:     Insulin: If steroids are continued,  please consider increasing Levemir to 25 units Q12H and ordering Novolog 3 units TID with meals for meal coverage if patient eats at least 50% of meals.   HbgA1C:  A1C 10% on 06/22/21. Inpatient diabetes coordinator spoke with patient on 07/14/21 (during current admission).   Thanks, Barnie Alderman, RN, MSN, CDE Diabetes Coordinator Inpatient Diabetes Program [phone] (Team Pager from 8am to 5pm)

## 2021-07-20 NOTE — Progress Notes (Signed)
Occupational Therapy Treatment Patient Details Name: Michele [name] MRN: [id number] DOB: 1955-07-26 Today's Date: 07/20/2021   History of present illness 66 y.o female with significant PMH of OSA on CPAP, hypothyroidism, diabetes mellitus, hypertension, CHF, DCIS, neutrophilia, hyperlipidemia, CAD, who presented to the ED with chief complaints of  shortness of breath x1 week. Pt being tx'ed for Acute Hypoxic Respiratory Failure secondary to Pulmonary Edema, Underlying Pneumonia and possible PE, started on heparin gtt. Pt s/p cath on 1/6, improved status on 1/7 and approved for therapy evaluation 24h post-start of amiodarone.   OT comments  Pt seen for OT tx this date to f/u re: safety with ADLs/ADL mobility. OT engage pt in standing ADLs including peri care with MIN A and hand hygiene with SETUP/SUPV with RW for standing balance. Pt tolerates fxl mobility to commode with RW ~15' and then 10' to recliner after ~3 min stand for ADL completion. She tolerates session well. HR slightly increased to 117bpm with fxl mobility, but pt overall stable throughout on RA (spO2 >94% throughout). Pt left seated in recliner with all needs met and in reach. RN aware of session contents. Will continue to follow. Continue to anticipate that [place] f/u along with support from her SIL for more strenuous IADL tasks (at least acutely), will be appropriate upon d/c from hospital setting.   Recommendations for follow up therapy are one component of a multi-disciplinary discharge planning process, led by the attending physician.  Recommendations may be updated based on patient status, additional functional criteria and insurance authorization.    Follow Up Recommendations  Home health OT    Assistance Recommended at Discharge Intermittent Supervision/Assistance  Patient can return home with the following  Assistance with cooking/housework;Assist for transportation   Equipment Recommendations  None recommended by OT (has all  necessary equipment)    Recommendations for Other Services      Precautions / Restrictions Precautions Precautions: Fall Restrictions Weight Bearing Restrictions: No       Mobility Bed Mobility Overal bed mobility: Needs Assistance Bed Mobility: Supine to Sit     Supine to sit: Min guard;HOB elevated     General bed mobility comments: increased time, use of bed rails    Transfers Overall transfer level: Needs assistance Equipment used: Rolling walker (2 wheels) Transfers: Sit to/from Stand Sit to Stand: Min guard;Supervision Stand pivot transfers: Min guard;Supervision         General transfer comment: CGA for CTS from EOB for intiial stand and CGA for SPS to commode initially for safety/and line&lead mgt, but with progress to SUPV for all other transfers completed and only minimal cues for hand placement to control descent as pt demos good carryover from previous therapy sessions.     Balance Overall balance assessment: Needs assistance Sitting-balance support: Feet supported Sitting balance-Leahy Scale: Good     Standing balance support: During functional activity;Bilateral upper extremity supported;Reliant on assistive device for balance Standing balance-Leahy Scale: Fair Standing balance comment: F balance for fxl mobility and basic transfers, P balance for pivoting/turning (in part d/t lines/leads)                           ADL either performed or assessed with clinical judgement   ADL Overall ADL's : Needs assistance/impaired     Grooming: Wash/dry hands;Set up;Supervision/safety;Standing Grooming Details (indicate cue type and reason): RW for support/balance                 Toilet  Transfer: Min guard;Supervision/safety;Rolling walker (2 wheels);Ambulation;BSC/3in1 Toilet Transfer Details (indicate cue type and reason): BSC placed over ICU toilet to elevate and increase safety wtih hand rails. pt able to complete fxl mobility ~15' from  bed to commode Toileting- Clothing Manipulation and Hygiene: Minimal assistance;Sit to/from stand Toileting - Clothing Manipulation Details (indicate cue type and reason): posterior peri care following BM, RW used for UE support/balance, MIN A for thorough completion d/t some limited activity tolerance.     Functional mobility during ADLs: Supervision/safety;Min guard;Rolling walker (2 wheels) (~15' to commode, then 10' to recliner. CGA initially, then progresses to SUPV.)      Extremity/Trunk Assessment              Vision       Perception     Praxis      Cognition Arousal/Alertness: Awake/alert Behavior During Therapy: WFL for tasks assessed/performed Overall Cognitive Status: Within Functional Limits for tasks assessed                                            Exercises Other Exercises Other Exercises: OT engage pt in standing ADLs including peri care with MIN A and hand hygiene with SETUP/SUPV with RW for standing balance. Pt tolerates fxl mobility to commode with RW ~15' and then 10' to recliner after ~3 min stand for ADL completion. She tolerates session well. HR slightly increased to 117bpm with fxl mobility, but pt overall stable throughout on RA (spO2 >94% throughout).   Shoulder Instructions       General Comments      Pertinent Vitals/ Pain       Pain Assessment: No/denies pain  Home Living                                          Prior Functioning/Environment              Frequency  Min 2X/week        Progress Toward Goals  OT Goals(current goals can now be found in the care plan section)  Progress towards OT goals: Progressing toward goals  Acute Rehab OT Goals Patient Stated Goal: to go home OT Goal Formulation: With patient Time For Goal Achievement: 07/30/21 Potential to Achieve Goals: Good  Plan Discharge plan remains appropriate    Co-evaluation                 AM-PAC OT "6 Clicks"  Daily Activity     Outcome Measure   Help from another person eating meals?: None Help from another person taking care of personal grooming?: A Little Help from another person toileting, which includes using toliet, bedpan, or urinal?: A Little Help from another person bathing (including washing, rinsing, drying)?: A Little Help from another person to put on and taking off regular upper body clothing?: A Little Help from another person to put on and taking off regular lower body clothing?: A Little 6 Click Score: 19    End of Session Equipment Utilized During Treatment: Gait belt;Rolling walker (2 wheels)  OT Visit Diagnosis: Unsteadiness on feet (R26.81);Muscle weakness (generalized) (M62.81)   Activity Tolerance Patient tolerated treatment well   Patient Left in chair;with call bell/phone within reach;with chair alarm set   Nurse Communication Mobility status  Time: 1696-7893 OT Time Calculation (min): 28 min  Charges: OT General Charges $OT Visit: 1 Visit OT Treatments $Self Care/Home Management : 8-22 mins $Therapeutic Activity: 8-22 mins  Gerrianne Scale, MS, OTR/L ascom [phone] 07/20/21, 11:48 AM

## 2021-07-20 NOTE — Progress Notes (Signed)
NAME:  Michele [name], MRN:  [id number], DOB:  [birth year], LOS: 7 ADMISSION DATE:  07/13/2021, CONSULTATION DATE:  07/13/2021 REFERRING MD:  Nance Pear, MD  CHIEF COMPLAINT:  SOB    Brief Pt Description / Synopsis  66 y.o. Female with Acute on Chronic HFrEF (EF 10-15%), Atrial Fibrillation w/ RVR, and Cardiogenic Shock requiring Milrinone and Vasopressors.  HPI  66 y.o female with significant PMH of OSA on CPAP, hypothyroidism, diabetes mellitus, hypertension, CHF, DCIS, neutrophilia, hyperlipidemia, CAD, who presented to the ED with chief complaints of  shortness of breath x1 week  ED Course: On arrival to the ED, she was afebrile with initial blood pressure 100/57 mm Hg and pulse rate146  beats/min, RR 22 sats 100% on RA.Marland Kitchen There were no focal neurological deficits; patient was alert oriented x4.  Chest x-ray was obtained showed cardiac enlargement with mild vascular congestion and mild left lower lobe atelectasis or infiltrate.  CTA angio chest showed incomplete opacification of segmental branches of the right lower lobe pulmonary artery, PE could not be excluded.  Cardiomegaly with scattered groundglass opacities and small bilateral effusion consistent with mild congestive heart failure. Pertinent Labs in Red/Diagnostics Findings: Na+/ K+: 134/3.9 Glucose: 298 BUN/Cr.: 43/1.76 Calcium: 8.4   WBC: 10.0 Hgb/Hct: 15.4/48.1 PCT: negative <0.10 Lactic acid: 3.9>4.2 COVID PCR: Negative   Troponin: 25 BNP: 738 D.Dimer: 2.54 TSH: 8.127 Given finding as above, patient is started on broad-spectrum antibiotics per sepsis protocol.  Patient remained hypotensive requiring vasopressors.  Also placed on BiPAP due to increased oxygen requirement.  PCCM consulted for further management.  Past Medical History  OSA on CPAP, hypothyroidism, diabetes mellitus, hypertension, CHF, DCIS, neutrophilia, hyperlipidemia, CAD,  Significant Hospital Events   1/4: Admitted to ICU with acute hypoxic  respiratory failure 07/15/21-  patient for cardiac cath. PT/OT after this and swallow evaluation. 07/16/20- patient s/p procedure , she is improved. She remains on milrinone,amiodarone and levophed drips. Clinically reports no chest discomfort.  07/17/21- patient is improved still on vasopressors 07/19/21: Underwent TEE and Cardioversion with conversion to NSR 07/20/21: Weaned off vasopressors.  Continues on Milrinone.  Creatinine stable, UOP 2.76L yesterday (net + 1.2L)  Consults:  Vascular PCCM Cardiology  Procedures:  1/6: Cardiac Catheterization 1/10: TEE and Cardioversion  Significant Diagnostic Tests:  1/4: Chest Xray>Cardiac enlargement with mild vascular congestion Mild left lower lobe atelectasis or infiltrate 1/4: CTA Chest>1. Incomplete opacification of segmental branches of the right lower lobe pulmonary artery. Favor flow related phenomenon and mixing artifact over nonocclusive thrombus. However, PE cannot be excluded. 2. Cardiomegaly, with scattered ground-glass opacities and small bilateral effusions consistent with mild congestive heart failure. 1/5: Venous US Bilateral LE: No evidence of femoropopliteal DVT within either lower extremity 1/5: Echocardiogram: 1. Left ventricular ejection fraction, by estimation, is <20%. The left  ventricle has severely decreased function. The left ventricle demonstrates  global hypokinesis , inferior and inferolateral wall motion best  preserved. The left ventricular internal  cavity size was moderately dilated. Left ventricular diastolic parameters are indeterminate.   2. Right ventricular systolic function is moderately reduced. The right  ventricular size is normal. There is normal pulmonary artery systolic  pressure.   3. Left atrial size was severely dilated.   4. Right atrial size was mildly dilated.   5. A small pericardial effusion is present.   6. The mitral valve is normal in structure. Mild to moderate mitral valve  regurgitation. No  evidence of mitral stenosis.   7. The inferior vena cava is  normal in size with greater than 50%  respiratory variability, suggesting right atrial pressure of 3 mmHg.  1/6: Cardiac CATH:1.  Significant one-vessel coronary artery disease with 70% stenosis in the mid LAD.  Sluggish TIMI II flow in all coronary arteries likely due to severely reduced cardiac output. 2.  Left ventricular angiography was not performed due to acute kidney injury.  EF was severely reduced by echo. 3.  Right heart catheterization showed moderately elevated filling pressures, mild pulmonary hypertension and severely reduced cardiac output.  Hemodynamics are consistent with cardiogenic shock with a cardiac output of 2.19 and cardiac index of 1.13.  Micro Data:  1/4: SARS-CoV-2 PCR> negative 1/4: Influenza PCR> negative 1/5: Blood culture x2>no growth 1/4: MRSA PCR>> Positive  Antimicrobials:  Azithromycin 1/4>1/5 Ceftriaxone 1/4>1/5  OBJECTIVE  Blood pressure 99/62, pulse 74, temperature 98.2 F (36.8 C), temperature source Oral, resp. rate 17, height '5\' 2"'  (1.575 m), weight 95.8 kg, SpO2 98 %.        Intake/Output Summary (Last 24 hours) at 07/20/2021 0826 Last data filed at 07/20/2021 0500 Gross per 24 hour  Intake 1535.51 ml  Output 2750 ml  Net -1214.49 ml    Filed Weights   07/18/21 0436 07/19/21 0413 07/20/21 0454  Weight: 95.6 kg 98 kg 95.8 kg     Physical Examination  GENERAL: 66 year-old acutely ill patient lying in the bed with no acute distress, on  EYES: Pupils equal, round, reactive to light and accommodation. No scleral icterus. Extraocular muscles intact.  HEENT: Head atraumatic, normocephalic. Oropharynx and nasopharynx clear.  NECK:  Supple, + JVD, No thyroid enlargement, no tenderness.  LUNGS: coarse breath sounds bilaterally, no wheezing, rales,rhonchi or crepitation. No use of accessory muscles of respiration.  CARDIOVASCULAR: S1, S2 normal. No murmurs, rubs, or gallops.   ABDOMEN: Soft, nontender, nondistended. Bowel sounds present. No organomegaly or mass.  EXTREMITIES: Trace edema to bilateral LE, NO cyanosis or clubbing.  NEUROLOGIC: Cranial nerves II through XII are intact.  Muscle strength 5/5 in all extremities. Sensation intact. Gait not checked.  SKIN: No obvious rash, lesion, or ulcer.   Labs/imaging that I havepersonally reviewed  (right click and "Reselect all SmartList Selections" daily)     Labs   CBC: Recent Labs  Lab 07/15/21 0511 07/16/21 0401 07/17/21 0249 07/18/21 0522 07/19/21 0513  WBC 10.0 14.7* 13.8* 13.5* 14.7*  HGB 16.4* 14.8 14.6 14.0 14.2  HCT 50.7* 45.7 43.9 41.8 42.7  MCV 98.6 97.0 94.4 96.3 94.1  PLT 166 205 185 171 167     Basic Metabolic Panel: Recent Labs  Lab 07/14/21 0253 07/15/21 0511 07/16/21 0401 07/17/21 0249 07/17/21 1633 07/18/21 0000 07/18/21 0522 07/19/21 0513 07/20/21 0450  NA 136 138  --  133* 131* 130* 133* 134* 136  K 4.0 3.9  --  3.4* 3.6 4.5 3.6 3.6 3.3*  CL 106 108  --  100 98 96* 99 100 101  CO2 17* 17*  --  20* 21* 21* '24 23 25  ' GLUCOSE 202* 64*  --  195* 448* 306* 146* 151* 220*  BUN 42* 45*  --  59* 59* 58* 57* 58* 57*  CREATININE 1.80* 1.62*  --  1.69* 1.75* 1.77* 1.66* 1.75* 1.70*  CALCIUM 7.9* 8.2*  --  7.9* 7.8* 7.9* 7.7* 8.2* 8.0*  MG 2.2 2.2 1.9 1.9  --   --   --  1.7  --   PHOS 3.6 4.2 3.9 2.3*  --   --  2.8 2.5  --  GFR: Estimated Creatinine Clearance: 35.6 mL/min (A) (by C-G formula based on SCr of 1.7 mg/dL (H)). Recent Labs  Lab 07/13/21 1925 07/14/21 0003 07/14/21 0253 07/15/21 0511 07/16/21 0401 07/17/21 0249 07/18/21 0522 07/19/21 0513  PROCALCITON <0.10  --  0.18 0.33  --   --   --   --   WBC  --   --   --  10.0 14.7* 13.8* 13.5* 14.7*  LATICACIDVEN  --  3.9* 4.2*  --   --   --   --   --      Liver Function Tests: Recent Labs  Lab 07/14/21 1136 07/19/21 0513  AST  --  84*  ALT  --  119*  ALKPHOS  --  131*  BILITOT  --  0.9  PROT 6.3*  6.4*  ALBUMIN  --  3.3*   No results for input(s): LIPASE, AMYLASE in the last 168 hours. No results for input(s): AMMONIA in the last 168 hours.  ABG    Component Value Date/Time   O2SAT PENDING 07/19/2021 0526     Coagulation Profile: Recent Labs  Lab 07/14/21 0506 07/19/21 1320  INR 1.4* 1.3*    Cardiac Enzymes: No results for input(s): CKTOTAL, CKMB, CKMBINDEX, TROPONINI in the last 168 hours.  HbA1C: Hemoglobin A1C  Date/Time Value Ref Range Status  06/22/2021 02:08 PM 10.0 (A) 4.0 - 5.6 % Final  07/30/2018 09:13 AM 9.9 (A) 4.0 - 5.6 % Final   Hgb A1c MFr Bld  Date/Time Value Ref Range Status  11/19/2019 11:43 AM 9.8 (H) 4.8 - 5.6 % Final    Comment:             Prediabetes: 5.7 - 6.4          Diabetes: >6.4          Glycemic control for adults with diabetes: <7.0   03/18/2018 03:53 PM 9.3 (H) 4.8 - 5.6 % Final    Comment:             Prediabetes: 5.7 - 6.4          Diabetes: >6.4          Glycemic control for adults with diabetes: <7.0     CBG: Recent Labs  Lab 07/19/21 1552 07/19/21 1935 07/20/21 0018 07/20/21 0427 07/20/21 0718  GLUCAP 144* 265* 252* 198* 148*     Review of Systems:   Positives in BOLD: Pt currently denies all complaints Gen: Denies fever, chills, weight change, fatigue, night sweats HEENT: Denies blurred vision, double vision, hearing loss, tinnitus, sinus congestion, rhinorrhea, sore throat, neck stiffness, dysphagia PULM: Denies shortness of breath, cough, sputum production, hemoptysis, wheezing CV: Denies chest pain, edema, orthopnea, paroxysmal nocturnal dyspnea, palpitations GI: Denies abdominal pain, nausea, vomiting, diarrhea, hematochezia, melena, constipation, change in bowel habits GU: Denies dysuria, hematuria, polyuria, oliguria, urethral discharge Endocrine: Denies hot or cold intolerance, polyuria, polyphagia or appetite change Derm: Denies rash, dry skin, scaling or peeling skin change Heme: Denies easy  bruising, bleeding, bleeding gums Neuro: Denies headache, numbness, weakness, slurred speech, loss of memory or consciousness   Past Medical History  She,  has a past medical history of Abscess of breast, right (08/19/2012), Breast cancer ([place]) (07/13/2010), CHF (congestive heart failure) ([place]), Coronary artery disease (2009), DCIS (ductal carcinoma in situ) of breast, right (2012), Diabetes mellitus ([place]), GERD (gastroesophageal reflux disease), Hyperlipidemia, Hypertension, Ischemic cardiomyopathy, Obesity, unspecified, Personal history of radiation therapy, Sleep apnea, Thyroid disease, and Umbilical hernia without mention of  obstruction or gangrene.   Surgical History    Past Surgical History:  Procedure Laterality Date   BREAST BIOPSY Right 2007   right bx neg   BREAST BIOPSY Right 08/10/2016   fat necrosis/ done in Dr. Dwyane Luo office   BREAST EXCISIONAL BIOPSY Right 2011   DCIS mammosite lumpectomy   BREAST LUMPECTOMY Right Jan 2012   Wide excision   BREAST MASS EXCISION Right 2011   December   CARDIAC CATHETERIZATION  01/08/2008   CHOLECYSTECTOMY  2013   [facility]   HERNIA REPAIR  2013   epigastric   INCISION AND DRAINAGE BREAST ABSCESS Right 08/19/2012   RIGHT/LEFT HEART CATH AND CORONARY ANGIOGRAPHY N/A 07/15/2021   Procedure: RIGHT/LEFT HEART CATH AND CORONARY ANGIOGRAPHY;  Surgeon: [name], MD;  Location: [facility];  Service: Cardiovascular;  Laterality: N/A;   TONSILLECTOMY AND ADENOIDECTOMY     age 41 yrs     Social History   reports that she has never smoked. She has never used smokeless tobacco. She reports that she does not drink alcohol and does not use drugs.   Family History   Her family history includes Arthritis in her father; CVA in her mother; Congestive Heart Failure in her mother; Diabetes in her mother and sister; Heart attack in her father, maternal grandfather, and paternal grandmother; Heart failure in her  mother; Hemochromatosis in her brother; Hypertension in her brother, brother, and sister; Lung cancer in her sister; Stroke in her maternal grandmother. There is no history of Breast cancer.   Allergies No Known Allergies   Home Medications  Prior to Admission medications   Medication Sig Start Date End Date Taking? Authorizing Provider  allopurinol (ZYLOPRIM) 300 MG tablet Take 1 tablet (300 mg total) by mouth daily. 11/19/19   Jerrol Banana., MD  amLODipine (NORVASC) 10 MG tablet TAKE 1 TABLET BY MOUTH  DAILY 04/16/19   Jerrol Banana., MD  Blood Glucose Monitoring Suppl (ONE TOUCH ULTRA SYSTEM KIT) w/Device KIT Check sugar once daily. DX E11.9 10/18/16   Jerrol Banana., MD  BYSTOLIC 20 MG TABS TAKE 1 TABLET BY MOUTH  DAILY 05/19/20   Jerrol Banana., MD  cetirizine (ZYRTEC) 10 MG tablet Take 1 tablet (10 mg total) by mouth daily. 03/18/18   Jerrol Banana., MD  fluticasone [facility]) 50 MCG/ACT nasal spray Place into the nose. 02/24/13   [provider]  glimepiride (AMARYL) 4 MG tablet TAKE 1 TABLET BY MOUTH  TWICE DAILY 02/25/20   Jerrol Banana., MD  Glucagon (GVOKE HYPOPEN 1-PACK) 1 MG/0.2ML SOAJ Inject 1 mg into the skin every 15 (fifteen) minutes as needed for up to 5 doses. 06/22/21   Myles Gip, DO  glucose blood (ONE TOUCH ULTRA TEST) test strip Use as instructed 12/10/17   Jerrol Banana., MD  insulin glargine (LANTUS SOLOSTAR) 100 UNIT/ML Solostar Pen INJECT 50 UNITS UNDER THE SKIN TWICE DAILY 12/31/19   Jerrol Banana., MD  Insulin Pen Needle 32G X 4 MM MISC Inject twice daily, SQ. DX E11.9 11/19/19   Jerrol Banana., MD  Lancets [facility] ULTRASOFT) lancets Check sugar once daily DX E11.9 12/10/17   Jerrol Banana., MD  levothyroxine (SYNTHROID) 200 MCG tablet TAKE 1 TABLET(200 MCG) BY MOUTH DAILY BEFORE BREAKFAST 06/22/21   Myles Gip, DO  losartan (COZAAR) 100 MG tablet TAKE 1 TABLET BY  MOUTH   DAILY 02/06/20   Jerrol Banana., MD  NON FORMULARY CPAP AT BEDTIME    [provider]  rosuvastatin (CRESTOR) 10 MG tablet TAKE 1 TABLET BY MOUTH  DAILY 11/28/19   Minna Merritts, MD  Syringe/Needle, Disp, (SYRINGE 3CC/25GX1") 25G X 1" 3 ML MISC 1 mL by Does not apply route every 30 (thirty) days. 05/03/16   Jerrol Banana., MD  Scheduled Meds:  furosemide  80 mg Intravenous BID   insulin aspart  0-20 Units Subcutaneous Q4H   insulin detemir  20 Units Subcutaneous Q12H   levothyroxine  200 mcg Oral Q0600   methylPREDNISolone (SOLU-MEDROL) injection  40 mg Intravenous Q12H   potassium chloride  40 mEq Oral BID   rosuvastatin  10 mg Oral Daily   sodium chloride flush  10-40 mL Intracatheter Q12H   Continuous Infusions:  sodium chloride     sodium chloride Stopped (07/18/21 1619)   sodium chloride     amiodarone 30 mg/hr (07/20/21 0423)   heparin 1,000 Units/hr (07/19/21 1830)   milrinone 0.25 mcg/kg/min (07/20/21 0057)   norepinephrine (LEVOPHED) Adult infusion 6 mcg/min (07/19/21 0542)   PRN Meds:.acetaminophen, docusate sodium, ondansetron (ZOFRAN) IV, polyethylene glycol, sodium chloride flush    Assessment & Plan:   Acute on Chronic HFrEF (LVEF <20%) Cardiogenic Shock Persistent Atrial Fibrillation with RVR s/p Cardioversion Coronary Artery Disease PMHx: Hypertension, Hyperlipidemia, CAD -Continuous cardiac monitoring -Maintain MAP >65 -Cardiology following, appreciate input -Vasopressors as needed to maintain MAP goal ~ currently weaned off -Continue Milrinone as per Cardiology, follow SvO2 -Diuresis as BP and renal function permits ~ currently on 80 mg Lasix BID as per Cardiology -Continue Amiodarone -Echocardiogram 1/5: LVEF <20%, indeterminate diastolic parameters, RV systolic function moderately reduced -S/p Cardiac Cath 1/6: "1.  Significant one-vessel coronary artery disease with 70% stenosis in the mid LAD.  Sluggish TIMI II flow in all  coronary arteries likely due to severely reduced cardiac output. 2.  Left ventricular angiography was not performed due to acute kidney injury.  EF was severely reduced by echo. 3.  Right heart catheterization showed moderately elevated filling pressures, mild pulmonary hypertension and severely reduced cardiac output.  Hemodynamics are consistent with cardiogenic shock with a cardiac output of 2.19 and cardiac index of 1.13." -S/p TEE with Cardioversion on 1/10 -May need to consider transfer to [facility] for LVAD and Advanced Heart Failure management ~ will defer to Cardiology  Acute Kidney Injury superimposed on CKD ? Cardiorenal Syndrome Mild Hypokalemia -Monitor I&O's / urinary output -Follow BMP -Ensure adequate renal perfusion -Avoid nephrotoxic agents as able -Replace electrolytes as indicated -Pharmacy following for assistance with electrolyte replacement   Diabetes mellitus -CBGs -Sliding scale insulin & Levemir -Follow ICU hyper/hypoglycemia protocol -Hold home Amaryl   Hypothyroidism -Continue synthroid  Best practice:  Diet:  Heart Healthy Pain/Anxiety/Delirium protocol (if indicated): No VAP protocol (if indicated): Not indicated DVT prophylaxis: Systemic AC GI prophylaxis: N/A Glucose control:  SSI Yes Central venous access:  Left arm PICC, and is still needed for vasopressors and inotropes Arterial line:  N/A Foley:  N/A (purewick in place) Mobility:  as tolerated PT consulted: N/A Last date of multidisciplinary goals of care discussion [1/11] Code Status:  full code Disposition: ICU   = Goals of Care = Code Status Order:  FULL  Primary Emergency Contact: Richardson,Robey Wishes to pursue full aggressive treatment and intervention options, including CPR and intubation, but goals of care will be addressed on going with  family if that should become necessary.   Critical care time: 40 minutes    Darel Hong, AGACNP-BC [facility] Prefer epic messenger for cross cover needs If after hours, please call E-link

## 2021-07-20 NOTE — Consult Note (Signed)
[place] for heparin infusion Indication: afib / ? PE  No Known Allergies  Patient Measurements: Height: 5\' 2"  (157.5 cm) Weight: 95.8 kg (211 lb 3.2 oz) IBW/kg (Calculated) : 50.1 Heparin Dosing Weight: 71.6 kg  Vital Signs: Temp: 96.9 F (36.1 C) (01/11 0800) Temp Source: Axillary (01/11 0800) BP: 99/66 (01/11 0800) Pulse Rate: 67 (01/11 0800)  Labs: Recent Labs    07/18/21 0522 07/19/21 0513 07/19/21 1320 07/20/21 0450 07/20/21 0834  HGB 14.0 14.2  --   --  14.4  HCT 41.8 42.7  --   --  42.5  PLT 171 167  --   --  111*  LABPROT  --   --  16.6*  --   --   INR  --   --  1.3*  --   --   HEPARINUNFRC 0.39 0.53  --   --  0.70  CREATININE 1.66* 1.75*  --  1.70*  --      Estimated Creatinine Clearance: 35.6 mL/min (A) (by C-G formula based on SCr of 1.7 mg/dL (H)).   Medical History: Past Medical History:  Diagnosis Date   Abscess of breast, right 08/19/2012   Breast cancer ([place]) 07/13/2010   1.5 cm,intermediate grade DCIS, nuclear grade 2, ER 90%, PR 90% treated with wide excision, reexcision to negative margins and MammoSite partial breast radiation.   CHF (congestive heart failure) ([place])    Coronary artery disease 2009   DCIS (ductal carcinoma in situ) of breast, right 2012   Diabetes mellitus ([facility])    Type II   GERD (gastroesophageal reflux disease)    Hyperlipidemia    Hypertension    Ischemic cardiomyopathy    Obesity, unspecified    Personal history of radiation therapy    Sleep apnea    Thyroid disease    hypothyroidism   Umbilical hernia without mention of obstruction or gangrene     Medications:  No prior AC noted  Heparin Dosing Weight: 71.6 kg  Assessment: 66 y.o female with significant PMH of OSA on CPAP, hypothyroidism, diabetes mellitus, hypertension, CHF, DCIS, neutrophilia, hyperlipidemia, CAD, who presented to the ED with chief complaints of  shortness of breath x1 week with worsening respiratory  status. CT concerning for possible PE. Patient with new onset afib this admission.  Date Time HL Rate/Comment 1/5 1928 1.1  Supra; 1200 > 1000 un/hr 1/6 0511 0.79 Supra; 1000 > 900 un/hr 1/6 heparin stopped at 0600; plan to resume 900 un/hr at 1700 per cards  1/6 2359 0.25 Sub 900> 950   1/7 0931  0.43  Therapeutic 1/7 1603 0.49 Therapeutic  1/8 0249 0.32 Therapeutic  1/8 1217 0.33 Therapeutic 1/9 0522 0.39 Therapeutic 1/10 0513 0.53 Therapeutic 1/11 0834 0.70 Therapeutic   Goal of Therapy:  Heparin level 0.3-0.7 units/ml Monitor platelets by anticoagulation protocol: Yes   Plan:  --Heparin level therapeutic --Continue heparin infusion at 1000 units/hr --Check HL and CBC daily while on heparin  Tawnya Crook, PharmD, BCPS Clinical Pharmacist   07/20/2021,11:27 AM

## 2021-07-20 NOTE — Progress Notes (Signed)
Non billable note: ICU downgrade to SDU  Briefly 66 year-old female with a past medical hisytory of OSA on CPAP, hypothyroidism, DM, HTN, CHF, DCIS, HLD, CAD who presented to the ED with shortness of breath x1 week. Admitted to the ICU for acute hypoxic respiratory failure 2/2 CHF exacerbation. ECHO showed LVEF< 20%. Found to be in Cardiogenic shock. Off vasopressors now. Cardiology following. Currently on milrinone drip and IV Lasix. Was found to also have A. FIB RVR and underwent DC cardioversion. Currently on PO amiodarone. Underwent ischemic workup and showed 70% mid LAD stenosis. Currently on heparin drip and plan to transition to NOAC upon discharge. On minimal oxygen. Working with PT.

## 2021-07-21 DIAGNOSIS — I25118 Atherosclerotic heart disease of native coronary artery with other forms of angina pectoris: Secondary | ICD-10-CM

## 2021-07-21 DIAGNOSIS — I5043 Acute on chronic combined systolic (congestive) and diastolic (congestive) heart failure: Secondary | ICD-10-CM | POA: Diagnosis not present

## 2021-07-21 DIAGNOSIS — I429 Cardiomyopathy, unspecified: Secondary | ICD-10-CM | POA: Diagnosis not present

## 2021-07-21 DIAGNOSIS — I4819 Other persistent atrial fibrillation: Secondary | ICD-10-CM | POA: Diagnosis not present

## 2021-07-21 LAB — GLUCOSE, CAPILLARY
Glucose-Capillary: 107 mg/dL — ABNORMAL HIGH (ref 70–99)
Glucose-Capillary: 111 mg/dL — ABNORMAL HIGH (ref 70–99)
Glucose-Capillary: 118 mg/dL — ABNORMAL HIGH (ref 70–99)
Glucose-Capillary: 170 mg/dL — ABNORMAL HIGH (ref 70–99)
Glucose-Capillary: 87 mg/dL (ref 70–99)

## 2021-07-21 LAB — CBC
HCT: 43.9 % (ref 36.0–46.0)
Hemoglobin: 14.6 g/dL (ref 12.0–15.0)
MCH: 31.9 pg (ref 26.0–34.0)
MCHC: 33.3 g/dL (ref 30.0–36.0)
MCV: 95.9 fL (ref 80.0–100.0)
Platelets: 113 10*3/uL — ABNORMAL LOW (ref 150–400)
RBC: 4.58 MIL/uL (ref 3.87–5.11)
RDW: 15.6 % — ABNORMAL HIGH (ref 11.5–15.5)
WBC: 15.4 10*3/uL — ABNORMAL HIGH (ref 4.0–10.5)
nRBC: 0 % (ref 0.0–0.2)

## 2021-07-21 LAB — BASIC METABOLIC PANEL
Anion gap: 11 (ref 5–15)
BUN: 62 mg/dL — ABNORMAL HIGH (ref 8–23)
CO2: 27 mmol/L (ref 22–32)
Calcium: 8.6 mg/dL — ABNORMAL LOW (ref 8.9–10.3)
Chloride: 99 mmol/L (ref 98–111)
Creatinine, Ser: 1.48 mg/dL — ABNORMAL HIGH (ref 0.44–1.00)
GFR, Estimated: 39 mL/min — ABNORMAL LOW (ref >60)
Glucose, Bld: 160 mg/dL — ABNORMAL HIGH (ref 70–99)
Potassium: 3.9 mmol/L (ref 3.5–5.1)
Sodium: 137 mmol/L (ref 135–145)

## 2021-07-21 LAB — HEPARIN LEVEL (UNFRACTIONATED): Heparin Unfractionated: 0.63 IU/mL (ref 0.30–0.70)

## 2021-07-21 LAB — COOXEMETRY PANEL
Carboxyhemoglobin: 1 % (ref 0.5–1.5)
Methemoglobin: 1.2 % (ref 0.0–1.5)
O2 Saturation: 62.3 %

## 2021-07-21 MED ORDER — METOPROLOL SUCCINATE ER 25 MG PO TB24
12.5000 mg | ORAL_TABLET | Freq: Every day | ORAL | Status: DC
  Administered 2021-07-21 – 2021-07-22 (×2): 12.5 mg via ORAL
  Filled 2021-07-21 (×2): qty 1
  Filled 2021-07-21: qty 0.5
  Filled 2021-07-21: qty 1

## 2021-07-21 MED ORDER — APIXABAN 5 MG PO TABS
5.0000 mg | ORAL_TABLET | Freq: Two times a day (BID) | ORAL | Status: DC
  Administered 2021-07-21 – 2021-07-24 (×7): 5 mg via ORAL
  Filled 2021-07-21 (×7): qty 1

## 2021-07-21 NOTE — Plan of Care (Signed)
°  Problem: Education: Goal: Knowledge of General Education information will improve Description: Including pain rating scale, medication(s)/side effects and non-pharmacologic comfort measures Outcome: Progressing   Problem: Health Behavior/Discharge Planning: Goal: Ability to manage health-related needs will improve Outcome: Progressing   Problem: Clinical Measurements: Goal: Ability to maintain clinical measurements within normal limits will improve Outcome: Progressing Goal: Will remain free from infection Outcome: Progressing Goal: Diagnostic test results will improve Outcome: Progressing Goal: Respiratory complications will improve Outcome: Progressing Goal: Cardiovascular complication will be avoided Outcome: Progressing   Problem: Activity: Goal: Risk for activity intolerance will decrease Outcome: Progressing   Problem: Nutrition: Goal: Adequate nutrition will be maintained Outcome: Progressing   Problem: Coping: Goal: Level of anxiety will decrease Outcome: Progressing   Problem: Elimination: Goal: Will not experience complications related to bowel motility Outcome: Progressing Goal: Will not experience complications related to urinary retention Outcome: Progressing   Problem: Pain Managment: Goal: General experience of comfort will improve Outcome: Progressing   Problem: Safety: Goal: Ability to remain free from injury will improve Outcome: Progressing   Problem: Skin Integrity: Goal: Risk for impaired skin integrity will decrease Outcome: Progressing  Patient progressing over all on room air HR NSR patient able to make all needs known and participate in ADL care

## 2021-07-21 NOTE — Progress Notes (Signed)
Progress Note  Patient Name: Michele [name] Date of Encounter: 07/21/2021  [facility] Cardiologist: Reid Regas  Subjective   Reports feeling better, Ambulated with PT, does get some shortness of breath and walking too far, needs to rest Overall feels that she is improving, especially since cardioversion Telemetry showing maintaining normal sinus rhythm  Appears from nursing notes milrinone was held yesterday evening 4 PM  Inpatient Medications    Scheduled Meds:  amiodarone  400 mg Oral BID   apixaban  5 mg Oral BID   furosemide  80 mg Intravenous BID   insulin aspart  0-20 Units Subcutaneous Q4H   insulin detemir  20 Units Subcutaneous Q12H   levothyroxine  200 mcg Oral Q0600   metoprolol succinate  12.5 mg Oral Daily   rosuvastatin  10 mg Oral Daily   sodium chloride flush  10-40 mL Intracatheter Q12H   Continuous Infusions:  sodium chloride Stopped (07/18/21 1619)   PRN Meds: acetaminophen, docusate sodium, ondansetron (ZOFRAN) IV, polyethylene glycol, sodium chloride flush   Vital Signs    Vitals:   07/21/21 0700 07/21/21 0800 07/21/21 1009 07/21/21 1554  BP: 107/81 99/73 118/79 95/64  Pulse: 74 72  77  Resp: 12 12 15 18   Temp:  97.6 F (36.4 C)  97.8 F (36.6 C)  TempSrc:  Oral  Oral  SpO2: 100% 100%  99%  Weight:      Height:        Intake/Output Summary (Last 24 hours) at 07/21/2021 1640 Last data filed at 07/21/2021 1600 Gross per 24 hour  Intake 699.28 ml  Output 1552 ml  Net -852.72 ml   Last 3 Weights 07/21/2021 07/20/2021 07/19/2021  Weight (lbs) 206 lb 12.7 oz 211 lb 3.2 oz 216 lb 0.8 oz  Weight (kg) 93.8 kg 95.8 kg 98 kg      Telemetry    Normal sinus rhythm- Personally Reviewed  ECG     - Personally Reviewed  Physical Exam   GEN: No acute distress.   Neck:  JVD 10+ Cardiac: RRR, no murmurs, rubs, or gallops.  Respiratory: Clear to auscultation bilaterally. GI: Soft, nontender, non-distended  MS: No edema; No deformity. Neuro:   Nonfocal  Psych: Normal affect   Labs    High Sensitivity Troponin:   Recent Labs  Lab 07/13/21 1706 07/13/21 1925  TROPONINIHS 25* 25*     Chemistry Recent Labs  Lab 07/16/21 0401 07/17/21 0249 07/17/21 1633 07/19/21 0513 07/20/21 0450 07/21/21 0337  NA  --  133*   < > 134* 136 137  K  --  3.4*   < > 3.6 3.3* 3.9  CL  --  100   < > 100 101 99  CO2  --  20*   < > 23 25 27   GLUCOSE  --  195*   < > 151* 220* 160*  BUN  --  59*   < > 58* 57* 62*  CREATININE  --  1.69*   < > 1.75* 1.70* 1.48*  CALCIUM  --  7.9*   < > 8.2* 8.0* 8.6*  MG 1.9 1.9  --  1.7  --   --   PROT  --   --   --  6.4*  --   --   ALBUMIN  --   --   --  3.3*  --   --   AST  --   --   --  84*  --   --   ALT  --   --   --  119*  --   --   ALKPHOS  --   --   --  131*  --   --   BILITOT  --   --   --  0.9  --   --   GFRNONAA  --  33*   < > 32* 33* 39*  ANIONGAP  --  13   < > 11 10 11    < > = values in this interval not displayed.    Lipids No results for input(s): CHOL, TRIG, HDL, LABVLDL, LDLCALC, CHOLHDL in the last 168 hours.  Hematology Recent Labs  Lab 07/19/21 0513 07/20/21 0834 07/21/21 0337  WBC 14.7* 11.2* 15.4*  RBC 4.54 4.49 4.58  HGB 14.2 14.4 14.6  HCT 42.7 42.5 43.9  MCV 94.1 94.7 95.9  MCH 31.3 32.1 31.9  MCHC 33.3 33.9 33.3  RDW 15.6* 15.4 15.6*  PLT 167 111* 113*   Thyroid No results for input(s): TSH, FREET4 in the last 168 hours.  BNPNo results for input(s): BNP, PROBNP in the last 168 hours.  DDimer  Recent Labs  Lab 07/15/21 0511 07/16/21 0401 07/17/21 0249  DDIMER 2.00* 1.60* 3.07*     Radiology    No results found.  Cardiac Studies   Echocardiogram  1. Left ventricular ejection fraction, by estimation, is <20%. The left  ventricle has severely decreased function. The left ventricle demonstrates  global hypokinesis , inferior and inferolateral wall motion best  preserved. The left ventricular internal  cavity size was moderately dilated. Left ventricular  diastolic parameters  are indeterminate.   2. Right ventricular systolic function is moderately reduced. The right  ventricular size is normal. There is normal pulmonary artery systolic  pressure.   3. Left atrial size was severely dilated.   4. Right atrial size was mildly dilated.   5. A small pericardial effusion is present.   6. The mitral valve is normal in structure. Mild to moderate mitral valve  regurgitation. No evidence of mitral stenosis.   7. The inferior vena cava is normal in size with greater than 50%  respiratory variability, suggesting   Patient Profile     66 y.o. female with history of CAD, diabetes, hyperlipidemia, nonischemic cardiomyopathy EF 20%, presenting with shortness of breath x1 week, diagnosed with A. fib RVR and acute CHF.  Assessment & Plan    Nonischemic cardiomyopathy Ejection fraction less than 20% on arrival in the setting of atrial fibrillation with RVR -Underwent TEE cardioversion, maintaining normal sinus rhythm -Appears milrinone was held yesterday afternoon -We will start on metoprolol succinate 12.5 mg daily Ideally would like to start losartan 12.5 mg daily, will monitor blood pressure before starting  Acute on chronic diastolic and systolic CHF/cardiogenic shock Appears milrinone infusion held yesterday afternoon Will start metoprolol succinate low-dose as detailed above Continue IV Lasix twice daily for now close monitoring of renal function -We will consider addition of SGLT2 inhibitor possibly tomorrow Digoxin held in the setting of borderline renal function  Persistent atrial fibrillation Underwent TEE cardioversion this admission, On amiodarone 400 twice daily, Eliquis 5 twice daily, metoprolol succinate 12.5 started High risk of recurrent arrhythmia in the setting of markedly dilated left atrium and cardiomyopathy  Coronary artery disease with stable angina 70% mid LAD disease, medical management recommended On Crestor, Eliquis  in place of aspirin   Total encounter time more than 35 minutes  Greater than 50% was spent in counseling and coordination of care with the patient    For questions  or updates, please contact [place] Please consult [email or web address] for contact info under        Signed, Ida Rogue, MD  07/21/2021, 4:40 PM

## 2021-07-21 NOTE — Consult Note (Signed)
ANTICOAGULATION CONSULT NOTE Pharmacy Consult for heparin infusion Indication: afib / ? PE  No Known Allergies  Patient Measurements: Height: 5\' 2"  (157.5 cm) Weight: 93.8 kg (206 lb 12.7 oz) IBW/kg (Calculated) : 50.1 Heparin Dosing Weight: 71.6 kg  Vital Signs: Temp: 97.7 F (36.5 C) (01/12 0200) Temp Source: Oral (01/12 0200) BP: 107/81 (01/12 0700) Pulse Rate: 74 (01/12 0700)  Labs: Recent Labs    07/19/21 0513 07/19/21 1320 07/20/21 0450 07/20/21 0834 07/21/21 0337 07/21/21 0429  HGB 14.2  --   --  14.4 14.6  --   HCT 42.7  --   --  42.5 43.9  --   PLT 167  --   --  111* 113*  --   LABPROT  --  16.6*  --   --   --   --   INR  --  1.3*  --   --   --   --   HEPARINUNFRC 0.53  --   --  0.70  --  0.63  CREATININE 1.75*  --  1.70*  --  1.48*  --      Estimated Creatinine Clearance: 40.4 mL/min (A) (by C-G formula based on SCr of 1.48 mg/dL (H)).   Medical History: Past Medical History:  Diagnosis Date   Abscess of breast, right 08/19/2012   Breast cancer ([place]) 07/13/2010   1.5 cm,intermediate grade DCIS, nuclear grade 2, ER 90%, PR 90% treated with wide excision, reexcision to negative margins and MammoSite partial breast radiation.   CHF (congestive heart failure) ([place])    Coronary artery disease 2009   DCIS (ductal carcinoma in situ) of breast, right 2012   Diabetes mellitus (HCC)    Type II   GERD (gastroesophageal reflux disease)    Hyperlipidemia    Hypertension    Ischemic cardiomyopathy    Obesity, unspecified    Personal history of radiation therapy    Sleep apnea    Thyroid disease    hypothyroidism   Umbilical hernia without mention of obstruction or gangrene     Medications:  No prior AC noted  Heparin Dosing Weight: 71.6 kg  Assessment: 66 y.o female with significant PMH of OSA on CPAP, hypothyroidism, diabetes mellitus, hypertension, CHF, DCIS, neutrophilia, hyperlipidemia, CAD, who presented to the ED with chief complaints of   shortness of breath x1 week with worsening respiratory status. CT concerning for possible PE. Patient with new onset afib this admission.  Date Time HL Rate/Comment 1/5 1928 1.1  Supra; 1200 > 1000 un/hr 1/6 0511 0.79 Supra; 1000 > 900 un/hr 1/6 heparin stopped at 0600; plan to resume 900 un/hr at 1700 per cards  1/6 2359 0.25 Sub 900> 950   1/7 0931  0.43  Therapeutic 1/7 1603 0.49 Therapeutic  1/8 0249 0.32 Therapeutic  1/8 1217 0.33 Therapeutic 1/9 0522 0.39 Therapeutic 1/10 0513 0.53 Therapeutic 1/11 0834 0.70 Therapeutic 1/12 0429 0.63 Therapeutic   Goal of Therapy:  Heparin level 0.3-0.7 units/ml Monitor platelets by anticoagulation protocol: Yes   Plan:  --Heparin level therapeutic --Continue heparin infusion at 1000 units/hr --Check HL and CBC daily while on heparin  Tawnya Crook, PharmD, BCPS Clinical Pharmacist   07/21/2021,7:43 AM

## 2021-07-21 NOTE — Progress Notes (Signed)
PROGRESS NOTE    Michele [name]  RXV:[id number] DOB: 01/02/1956 DOA: 07/13/2021 PCP: Jerrol Banana., MD     Brief Narrative:  Michele [name] is a 66 y.o female with significant PMH of OSA on CPAP, hypothyroidism, diabetes mellitus, hypertension, CHF, DCIS, neutrophilia, hyperlipidemia, CAD, who presented to the ED with chief complaints of  shortness of breath x1 week. Chest x-ray was obtained showed cardiac enlargement with mild vascular congestion and mild left lower lobe atelectasis or infiltrate.  CTA angio chest showed incomplete opacification of segmental branches of the right lower lobe pulmonary artery, PE could not be excluded.  Cardiomegaly with scattered groundglass opacities and small bilateral effusion consistent with mild congestive heart failure.  She was started on broad-spectrum antibiotics.  She became hypotensive and required vasopressors and was admitted to ICU on 1/5.  She was also placed on BiPAP due to increased oxygen requirement.  Further work-up revealed CHF exacerbation with EF less than 20%.  She was found to be in cardiogenic shock and cardiology was managing patient on milrinone drip and IV Lasix.  She also had A. fib RVR and underwent DC cardioversion.  Underwent ischemic work-up which revealed 70% mid LAD stenosis.  She was transferred to [facility] 1/12.  New events last 24 hours / Subjective: Patient states that she is feeling well.  Denies any chest pain or shortness of breath.  She remained stable on room air.  She continues to have lower extremity edema.  Complains of bilateral feet dryness as well as some cracking and open wounds.    Assessment & Plan:   Principal Problem:   Acute respiratory failure with hypoxia (HCC) Active Problems:   Atherosclerosis of native coronary artery of native heart with stable angina pectoris (HCC)   Diabetes mellitus (HCC)   Atrial fibrillation (HCC)   Pressure injury of skin   Dilated cardiomyopathy (HCC)   Acute systolic  heart failure (HCC)   Cardiogenic shock (HCC)   Acute hypoxemic respiratory failure -Required BiPAP on admission -Resolved and remains on room air  Cardiogenic shock, acute on chronic systolic heart failure Non-ischemic cardiomyopathy -Status post heart cath 1/6 -Now weaned off vasopressors, milrinone -Continue IV Lasix, Toprol -Cardiology following  A. fib RVR -Status post DC cardioversion 1/10 -In normal sinus rhythm this morning -Continue amiodarone, eliquis  CAD -Continue Crestor  Diabetes mellitus type 2, uncontrolled with hyperglycemia -A1c 9.8 -Continue Levemir, sliding scale insulin  CKD stage IIIb -Baseline creatinine 1.5 in December 2022 -Remains stable  Hypothyroidism -Continue Synthroid  In agreement with assessment of the pressure ulcer as below:  Pressure Injury 07/14/21 Perineum Left;Right Stage 2 -  Partial thickness loss of dermis presenting as a shallow open injury with a red, pink wound bed without slough. (Active)  07/14/21 0159  Location: Perineum  Location Orientation: Left;Right  Staging: Stage 2 -  Partial thickness loss of dermis presenting as a shallow open injury with a red, pink wound bed without slough.  Wound Description (Comments):   Present on Admission: Yes     Pressure Injury 07/14/21 Sacrum Mid Stage 2 -  Partial thickness loss of dermis presenting as a shallow open injury with a red, pink wound bed without slough. (Active)  07/14/21 0201  Location: Sacrum  Location Orientation: Mid  Staging: Stage 2 -  Partial thickness loss of dermis presenting as a shallow open injury with a red, pink wound bed without slough.  Wound Description (Comments):   Present on Admission:  DVT prophylaxis:   apixaban (ELIQUIS) tablet 5 mg  Code Status: Full Family Communication: None at bedside Disposition Plan:  Status is: Inpatient  Remains inpatient appropriate because: Remains on IV Lasix    Antimicrobials:  Anti-infectives  (From admission, onward)    Start     Dose/Rate Route Frequency Ordered Stop   07/14/21 2100  azithromycin (ZITHROMAX) 500 mg in sodium chloride 0.9 % 250 mL IVPB  Status:  Discontinued        500 mg 250 mL/hr over 60 Minutes Intravenous Every 24 hours 07/14/21 0416 07/14/21 1014   07/14/21 2000  cefTRIAXone (ROCEPHIN) 1 g in sodium chloride 0.9 % 100 mL IVPB  Status:  Discontinued        1 g 200 mL/hr over 30 Minutes Intravenous Every 24 hours 07/14/21 0416 07/14/21 1014   07/13/21 1945  cefTRIAXone (ROCEPHIN) 1 g in sodium chloride 0.9 % 100 mL IVPB        1 g 200 mL/hr over 30 Minutes Intravenous  Once 07/13/21 1935 07/13/21 2012   07/13/21 1945  azithromycin (ZITHROMAX) 500 mg in sodium chloride 0.9 % 250 mL IVPB        500 mg 250 mL/hr over 60 Minutes Intravenous  Once 07/13/21 1935 07/13/21 2053        Objective: Vitals:   07/21/21 0600 07/21/21 0700 07/21/21 0800 07/21/21 1009  BP: 94/80 107/81 99/73 118/79  Pulse: 74 74 72   Resp: 14 12 12 15   Temp:   97.6 F (36.4 C)   TempSrc:   Oral   SpO2: 94% 100% 100%   Weight:      Height:        Intake/Output Summary (Last 24 hours) at 07/21/2021 1349 Last data filed at 07/21/2021 1200 Gross per 24 hour  Intake 605.95 ml  Output 1552 ml  Net -946.05 ml   Filed Weights   07/19/21 0413 07/20/21 0454 07/21/21 0456  Weight: 98 kg 95.8 kg 93.8 kg    Examination:  General exam: Appears calm and comfortable  Respiratory system: Clear to auscultation. Respiratory effort normal. No respiratory distress. No conversational dyspnea.  Cardiovascular system: S1 & S2 heard, RRR. No murmurs.  Bilateral pedal edema. Gastrointestinal system: Abdomen is nondistended, soft and nontender. Normal bowel sounds heard. Central nervous system: Alert and oriented. No focal neurological deficits. Speech clear.  Extremities: Symmetric in appearance  Skin: Bilateral feet with dryness, ecchymosis, some cracked areas on the plantar surface of  bilateral feet as well as great toe which appears to be scabbing.  No surrounding erythema, signs of infection or drainage Psychiatry: Judgement and insight appear normal. Mood & affect appropriate.   Data Reviewed: I have personally reviewed following labs and imaging studies  CBC: Recent Labs  Lab 07/17/21 0249 07/18/21 0522 07/19/21 0513 07/20/21 0834 07/21/21 0337  WBC 13.8* 13.5* 14.7* 11.2* 15.4*  HGB 14.6 14.0 14.2 14.4 14.6  HCT 43.9 41.8 42.7 42.5 43.9  MCV 94.4 96.3 94.1 94.7 95.9  PLT 185 171 167 111* 161*   Basic Metabolic Panel: Recent Labs  Lab 07/15/21 0511 07/16/21 0401 07/17/21 0249 07/17/21 1633 07/18/21 0000 07/18/21 0522 07/19/21 0513 07/20/21 0450 07/21/21 0337  NA 138  --  133*   < > 130* 133* 134* 136 137  K 3.9  --  3.4*   < > 4.5 3.6 3.6 3.3* 3.9  CL 108  --  100   < > 96* 99 100 101 99  CO2 17*  --  20*   < > 21* 24 23 25 27   GLUCOSE 64*  --  195*   < > 306* 146* 151* 220* 160*  BUN 45*  --  59*   < > 58* 57* 58* 57* 62*  CREATININE 1.62*  --  1.69*   < > 1.77* 1.66* 1.75* 1.70* 1.48*  CALCIUM 8.2*  --  7.9*   < > 7.9* 7.7* 8.2* 8.0* 8.6*  MG 2.2 1.9 1.9  --   --   --  1.7  --   --   PHOS 4.2 3.9 2.3*  --   --  2.8 2.5  --   --    < > = values in this interval not displayed.   GFR: Estimated Creatinine Clearance: 40.4 mL/min (A) (by C-G formula based on SCr of 1.48 mg/dL (H)). Liver Function Tests: Recent Labs  Lab 07/19/21 0513  AST 84*  ALT 119*  ALKPHOS 131*  BILITOT 0.9  PROT 6.4*  ALBUMIN 3.3*   No results for input(s): LIPASE, AMYLASE in the last 168 hours. No results for input(s): AMMONIA in the last 168 hours. Coagulation Profile: Recent Labs  Lab 07/19/21 1320  INR 1.3*   Cardiac Enzymes: No results for input(s): CKTOTAL, CKMB, CKMBINDEX, TROPONINI in the last 168 hours. BNP (last 3 results) No results for input(s): PROBNP in the last 8760 hours. HbA1C: No results for input(s): HGBA1C in the last 72  hours. CBG: Recent Labs  Lab 07/20/21 1634 07/20/21 1927 07/20/21 2353 07/21/21 0316 07/21/21 0730  GLUCAP 202* 228* 205* 170* 118*   Lipid Profile: No results for input(s): CHOL, HDL, LDLCALC, TRIG, CHOLHDL, LDLDIRECT in the last 72 hours. Thyroid Function Tests: No results for input(s): TSH, T4TOTAL, FREET4, T3FREE, THYROIDAB in the last 72 hours. Anemia Panel: No results for input(s): VITAMINB12, FOLATE, FERRITIN, TIBC, IRON, RETICCTPCT in the last 72 hours. Sepsis Labs: Recent Labs  Lab 07/15/21 0511  PROCALCITON 0.33    Recent Results (from the past 240 hour(s))  Resp Panel by RT-PCR (Flu A&B, Covid) Nasopharyngeal Swab     Status: None   Collection Time: 07/13/21  5:06 PM   Specimen: Nasopharyngeal Swab; Nasopharyngeal(NP) swabs in vial transport medium  Result Value Ref Range Status   SARS Coronavirus 2 by RT PCR NEGATIVE NEGATIVE Final    Comment: (NOTE) SARS-CoV-2 target nucleic acids are NOT DETECTED.  The SARS-CoV-2 RNA is generally detectable in upper respiratory specimens during the acute phase of infection. The lowest concentration of SARS-CoV-2 viral copies this assay can detect is 138 copies/mL. A negative result does not preclude SARS-Cov-2 infection and should not be used as the sole basis for treatment or other patient management decisions. A negative result may occur with  improper specimen collection/handling, submission of specimen other than nasopharyngeal swab, presence of viral mutation(s) within the areas targeted by this assay, and inadequate number of viral copies(<138 copies/mL). A negative result must be combined with clinical observations, patient history, and epidemiological information. The expected result is Negative.  Fact Sheet for Patients:  EntrepreneurPulse.com.au  Fact Sheet for Healthcare Providers:  IncredibleEmployment.be  This test is no t yet approved or cleared by the [place] FDA  and  has been authorized for detection and/or diagnosis of SARS-CoV-2 by FDA under an Emergency Use Authorization (EUA). This EUA will remain  in effect (meaning this test can be used) for the duration of the COVID-19 declaration under Section 564(b)(1) of the Act, 21 U.S.C.section 360bbb-3(b)(1), unless the authorization is  terminated  or revoked sooner.       Influenza A by PCR NEGATIVE NEGATIVE Final   Influenza B by PCR NEGATIVE NEGATIVE Final    Comment: (NOTE) The Xpert Xpress SARS-CoV-2/FLU/RSV plus assay is intended as an aid in the diagnosis of influenza from Nasopharyngeal swab specimens and should not be used as a sole basis for treatment. Nasal washings and aspirates are unacceptable for Xpert Xpress SARS-CoV-2/FLU/RSV testing.  Fact Sheet for Patients: EntrepreneurPulse.com.au  Fact Sheet for Healthcare Providers: IncredibleEmployment.be  This test is not yet approved or cleared by the [place] FDA and has been authorized for detection and/or diagnosis of SARS-CoV-2 by FDA under an Emergency Use Authorization (EUA). This EUA will remain in effect (meaning this test can be used) for the duration of the COVID-19 declaration under Section 564(b)(1) of the Act, 21 U.S.C. section 360bbb-3(b)(1), unless the authorization is terminated or revoked.  Performed at [facility], [address] [place], [place] [address]   MRSA Next Gen by PCR, Nasal     Status: Abnormal   Collection Time: 07/14/21  1:19 AM   Specimen: Nasal Mucosa; Nasal Swab  Result Value Ref Range Status   MRSA by PCR Next Gen DETECTED (A) NOT DETECTED Final    Comment: CRITICAL RESULT CALLED TO, READ BACK BY AND VERIFIED WITH: MABIS ARTIS @ 6269 ON 07/14/2021.Marland KitchenMarland KitchenTKR (NOTE) The GeneXpert MRSA Assay (FDA approved for NASAL specimens only), is one component of a comprehensive MRSA colonization surveillance program. It is not intended to diagnose MRSA  infection nor to guide or monitor treatment for MRSA infections. Test performance is not FDA approved in patients less than 45 years old. Performed at [facility], [address] [place], [place] [address]   CULTURE, BLOOD (ROUTINE X 2) w Reflex to ID Panel     Status: None   Collection Time: 07/14/21  5:06 AM   Specimen: BLOOD LEFT HAND  Result Value Ref Range Status   Specimen Description BLOOD LEFT HAND  Final   Special Requests   Final    BOTTLES DRAWN AEROBIC AND ANAEROBIC Blood Culture results may not be optimal due to an inadequate volume of blood received in culture bottles   Culture   Final    NO GROWTH 5 DAYS Performed at [facility], [address] [place], [place] [address]    Report Status 07/19/2021 FINAL  Final  CULTURE, BLOOD (ROUTINE X 2) w Reflex to ID Panel     Status: None   Collection Time: 07/14/21  5:07 AM   Specimen: BLOOD LEFT FOREARM  Result Value Ref Range Status   Specimen Description BLOOD LEFT FOREARM  Final   Special Requests   Final    BOTTLES DRAWN AEROBIC AND ANAEROBIC Blood Culture adequate volume   Culture   Final    NO GROWTH 5 DAYS Performed at [facility], [address], [place], [place] [address]    Report Status 07/19/2021 FINAL  Final      Radiology Studies: No results found.    Scheduled Meds:  amiodarone  400 mg Oral BID   apixaban  5 mg Oral BID   furosemide  80 mg Intravenous BID   insulin aspart  0-20 Units Subcutaneous Q4H   insulin detemir  20 Units Subcutaneous Q12H   levothyroxine  200 mcg Oral Q0600   metoprolol succinate  12.5 mg Oral Daily   rosuvastatin  10 mg Oral Daily   sodium chloride flush  10-40  mL Intracatheter Q12H   Continuous Infusions:  sodium chloride Stopped (07/18/21 1619)     LOS: 8 days      Time spent: 40 minutes   Dessa Phi, DO [facility] 07/21/2021, 1:49 PM   Available via Epic secure chat 7am-7pm After these hours, please refer to  coverage provider listed on [email or web address]

## 2021-07-22 DIAGNOSIS — I429 Cardiomyopathy, unspecified: Secondary | ICD-10-CM | POA: Diagnosis not present

## 2021-07-22 DIAGNOSIS — I5043 Acute on chronic combined systolic (congestive) and diastolic (congestive) heart failure: Secondary | ICD-10-CM | POA: Diagnosis not present

## 2021-07-22 DIAGNOSIS — I25118 Atherosclerotic heart disease of native coronary artery with other forms of angina pectoris: Secondary | ICD-10-CM | POA: Diagnosis not present

## 2021-07-22 DIAGNOSIS — I4819 Other persistent atrial fibrillation: Secondary | ICD-10-CM | POA: Diagnosis not present

## 2021-07-22 LAB — BASIC METABOLIC PANEL
Anion gap: 9 (ref 5–15)
BUN: 69 mg/dL — ABNORMAL HIGH (ref 8–23)
CO2: 30 mmol/L (ref 22–32)
Calcium: 8.8 mg/dL — ABNORMAL LOW (ref 8.9–10.3)
Chloride: 98 mmol/L (ref 98–111)
Creatinine, Ser: 1.51 mg/dL — ABNORMAL HIGH (ref 0.44–1.00)
GFR, Estimated: 38 mL/min — ABNORMAL LOW (ref >60)
Glucose, Bld: 52 mg/dL — ABNORMAL LOW (ref 70–99)
Potassium: 3.2 mmol/L — ABNORMAL LOW (ref 3.5–5.1)
Sodium: 137 mmol/L (ref 135–145)

## 2021-07-22 LAB — GLUCOSE, CAPILLARY
Glucose-Capillary: 84 mg/dL (ref 70–99)
Glucose-Capillary: 121 mg/dL — ABNORMAL HIGH (ref 70–99)
Glucose-Capillary: 188 mg/dL — ABNORMAL HIGH (ref 70–99)
Glucose-Capillary: 188 mg/dL — ABNORMAL HIGH (ref 70–99)
Glucose-Capillary: 276 mg/dL — ABNORMAL HIGH (ref 70–99)

## 2021-07-22 LAB — CBC
HCT: 44.4 % (ref 36.0–46.0)
Hemoglobin: 14.6 g/dL (ref 12.0–15.0)
MCH: 31.2 pg (ref 26.0–34.0)
MCHC: 32.9 g/dL (ref 30.0–36.0)
MCV: 94.9 fL (ref 80.0–100.0)
Platelets: 126 10*3/uL — ABNORMAL LOW (ref 150–400)
RBC: 4.68 MIL/uL (ref 3.87–5.11)
RDW: 15.8 % — ABNORMAL HIGH (ref 11.5–15.5)
WBC: 20.3 10*3/uL — ABNORMAL HIGH (ref 4.0–10.5)
nRBC: 0 % (ref 0.0–0.2)

## 2021-07-22 LAB — MAGNESIUM: Magnesium: 1.8 mg/dL (ref 1.7–2.4)

## 2021-07-22 MED ORDER — INSULIN ASPART 100 UNIT/ML IJ SOLN
0.0000 [IU] | Freq: Three times a day (TID) | INTRAMUSCULAR | Status: DC
  Administered 2021-07-22: 1 [IU] via SUBCUTANEOUS
  Administered 2021-07-23 (×2): 3 [IU] via SUBCUTANEOUS
  Administered 2021-07-23: 1 [IU] via SUBCUTANEOUS
  Filled 2021-07-22 (×3): qty 1

## 2021-07-22 MED ORDER — INSULIN DETEMIR 100 UNIT/ML ~~LOC~~ SOLN
15.0000 [IU] | Freq: Two times a day (BID) | SUBCUTANEOUS | Status: DC
  Administered 2021-07-23 – 2021-07-24 (×4): 15 [IU] via SUBCUTANEOUS
  Filled 2021-07-22 (×5): qty 0.15

## 2021-07-22 MED ORDER — POTASSIUM CHLORIDE 20 MEQ PO PACK
40.0000 meq | PACK | Freq: Once | ORAL | Status: AC
  Administered 2021-07-22: 40 meq via ORAL
  Filled 2021-07-22: qty 2

## 2021-07-22 MED ORDER — POTASSIUM CHLORIDE CRYS ER 20 MEQ PO TBCR
40.0000 meq | EXTENDED_RELEASE_TABLET | ORAL | Status: DC
  Administered 2021-07-22: 40 meq via ORAL
  Filled 2021-07-22: qty 2

## 2021-07-22 MED ORDER — LOSARTAN POTASSIUM 25 MG PO TABS
12.5000 mg | ORAL_TABLET | Freq: Every evening | ORAL | Status: DC
  Administered 2021-07-22 – 2021-07-23 (×2): 12.5 mg via ORAL
  Filled 2021-07-22 (×2): qty 1

## 2021-07-22 NOTE — Progress Notes (Signed)
Mobility Specialist - Progress Note   07/22/21 1500  Mobility  Activity Transferred to/from [facility];Transferred:  Bed to chair  Level of Assistance Standby assist, set-up cues, supervision of patient - no hands on  Assistive Device Front wheel walker;BSC  Distance Ambulated (ft) 4 ft  Mobility Out of bed to chair with meals;Out of bed for toileting  Mobility Response Tolerated well  Mobility performed by Mobility specialist  $Mobility charge 1 Mobility    Pt lying in bed upon arrival, requesting assistance to [facility]. Pt sat EOB with minA. Transferred to [facility] for BM with supervision---assist for peri-care. New bed linen provided. Pt returned to recliner with needs in reach, family at bedside. RN notified.   Kathee Delton Mobility Specialist 07/22/21, 3:10 PM

## 2021-07-22 NOTE — Progress Notes (Signed)
Progress Note  Patient Name: Michele [name] Date of Encounter: 07/22/2021  [facility] Cardiologist: Prisila Dlouhy  Subjective   Reports ambulating around nursing unit Little bit of shortness of breath when she got back to her recliner Denies chest pain, no lightheadedness no dizziness Telemetry showing normal sinus rhythm  Inpatient Medications    Scheduled Meds:  amiodarone  400 mg Oral BID   apixaban  5 mg Oral BID   furosemide  80 mg Intravenous BID   insulin aspart  0-6 Units Subcutaneous TID WC   insulin detemir  15 Units Subcutaneous Q12H   levothyroxine  200 mcg Oral Q0600   metoprolol succinate  12.5 mg Oral Daily   rosuvastatin  10 mg Oral Daily   sodium chloride flush  10-40 mL Intracatheter Q12H   Continuous Infusions:  sodium chloride Stopped (07/18/21 1619)   PRN Meds: acetaminophen, docusate sodium, ondansetron (ZOFRAN) IV, polyethylene glycol, sodium chloride flush   Vital Signs    Vitals:   07/22/21 0337 07/22/21 0500 07/22/21 1154 07/22/21 1658  BP: 102/72  111/73 111/73  Pulse: 72  98 71  Resp: 20  18 16   Temp: 98.1 F (36.7 C)  98.3 F (36.8 C) 98.5 F (36.9 C)  TempSrc:   Oral Oral  SpO2: 98%  100% 100%  Weight:  93.2 kg    Height:        Intake/Output Summary (Last 24 hours) at 07/22/2021 1729 Last data filed at 07/22/2021 0900 Gross per 24 hour  Intake 480 ml  Output --  Net 480 ml   Last 3 Weights 07/22/2021 07/21/2021 07/20/2021  Weight (lbs) 205 lb 7.5 oz 206 lb 12.7 oz 211 lb 3.2 oz  Weight (kg) 93.2 kg 93.8 kg 95.8 kg      Telemetry    Normal sinus rhythm- Personally Reviewed  ECG     - Personally Reviewed  Physical Exam   GEN: No acute distress.   Neck:  JVD 10+ Cardiac: RRR, no murmurs, rubs, or gallops.  Respiratory: Clear to auscultation bilaterally. GI: Soft, nontender, non-distended  MS: No edema; No deformity. Neuro:  Nonfocal  Psych: Normal affect   Labs    High Sensitivity Troponin:   Recent Labs  Lab  07/13/21 1706 07/13/21 1925  TROPONINIHS 25* 25*     Chemistry Recent Labs  Lab 07/17/21 0249 07/17/21 1633 07/19/21 0513 07/20/21 0450 07/21/21 0337 07/22/21 0500 07/22/21 0542  NA 133*   < > 134* 136 137  --  137  K 3.4*   < > 3.6 3.3* 3.9  --  3.2*  CL 100   < > 100 101 99  --  98  CO2 20*   < > 23 25 27   --  30  GLUCOSE 195*   < > 151* 220* 160*  --  52*  BUN 59*   < > 58* 57* 62*  --  69*  CREATININE 1.69*   < > 1.75* 1.70* 1.48*  --  1.51*  CALCIUM 7.9*   < > 8.2* 8.0* 8.6*  --  8.8*  MG 1.9  --  1.7  --   --  1.8  --   PROT  --   --  6.4*  --   --   --   --   ALBUMIN  --   --  3.3*  --   --   --   --   AST  --   --  84*  --   --   --   --  ALT  --   --  119*  --   --   --   --   ALKPHOS  --   --  131*  --   --   --   --   BILITOT  --   --  0.9  --   --   --   --   GFRNONAA 33*   < > 32* 33* 39*  --  38*  ANIONGAP 13   < > 11 10 11   --  9   < > = values in this interval not displayed.    Lipids No results for input(s): CHOL, TRIG, HDL, LABVLDL, LDLCALC, CHOLHDL in the last 168 hours.  Hematology Recent Labs  Lab 07/20/21 0834 07/21/21 0337 07/22/21 0542  WBC 11.2* 15.4* 20.3*  RBC 4.49 4.58 4.68  HGB 14.4 14.6 14.6  HCT 42.5 43.9 44.4  MCV 94.7 95.9 94.9  MCH 32.1 31.9 31.2  MCHC 33.9 33.3 32.9  RDW 15.4 15.6* 15.8*  PLT 111* 113* 126*   Thyroid No results for input(s): TSH, FREET4 in the last 168 hours.  BNPNo results for input(s): BNP, PROBNP in the last 168 hours.  DDimer  Recent Labs  Lab 07/16/21 0401 07/17/21 0249  DDIMER 1.60* 3.07*     Radiology    No results found.  Cardiac Studies   Echocardiogram  1. Left ventricular ejection fraction, by estimation, is <20%. The left  ventricle has severely decreased function. The left ventricle demonstrates  global hypokinesis , inferior and inferolateral wall motion best  preserved. The left ventricular internal  cavity size was moderately dilated. Left ventricular diastolic parameters  are  indeterminate.   2. Right ventricular systolic function is moderately reduced. The right  ventricular size is normal. There is normal pulmonary artery systolic  pressure.   3. Left atrial size was severely dilated.   4. Right atrial size was mildly dilated.   5. A small pericardial effusion is present.   6. The mitral valve is normal in structure. Mild to moderate mitral valve  regurgitation. No evidence of mitral stenosis.   7. The inferior vena cava is normal in size with greater than 50%  respiratory variability, suggesting   Patient Profile     66 y.o. female with history of CAD, diabetes, hyperlipidemia, nonischemic cardiomyopathy EF 20%, presenting with shortness of breath x1 week, diagnosed with A. fib RVR and acute CHF.  Assessment & Plan    Nonischemic cardiomyopathy Ejection fraction less than 20% on arrival in the setting of atrial fibrillation with RVR -Completed TEE cardioversion, maintaining normal sinus rhythm -Appears milrinone was held by hospitalist service p.m. 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 Appears milrinone infusion held 2 days ago Tolerating low-dose metoprolol succinate -Start losartan 12.5 daily consider addition of SGLT2 inhibitor over the weekend Digoxin held in the setting of borderline renal function  Persistent atrial fibrillation Underwent TEE cardioversion this admission, On amiodarone 400 twice daily, Eliquis 5 twice daily, metoprolol succinate 12.5  High risk of recurrent arrhythmia in the setting of markedly dilated left atrium and cardiomyopathy  Coronary artery disease with stable angina 70% mid LAD disease, medical management recommended On Crestor, Eliquis  in place of aspirin  Long discussion concerning  need for medication compliance, cardiac rehab, close follow-up in clinic  Total encounter time more than 35 minutes  Greater than 50% was spent in counseling and coordination of care with the patient    For questions or updates, please contact [place] Please consult [email or web address] for contact info under        Signed, Ida Rogue, MD  07/22/2021, 5:29 PM

## 2021-07-22 NOTE — Progress Notes (Addendum)
PROGRESS NOTE    Michele [name]  NLZ:[id number] DOB: Oct 17, 1955 DOA: 07/13/2021 PCP: Jerrol Banana., MD     Brief Narrative:  Michele [name] is a 66 y.o female with significant PMH of OSA on CPAP, hypothyroidism, diabetes mellitus, hypertension, CHF, DCIS, neutrophilia, hyperlipidemia, CAD, who presented to the ED with chief complaints of  shortness of breath x1 week. Chest x-ray was obtained showed cardiac enlargement with mild vascular congestion and mild left lower lobe atelectasis or infiltrate.  CTA angio chest showed incomplete opacification of segmental branches of the right lower lobe pulmonary artery, PE could not be excluded.  Cardiomegaly with scattered groundglass opacities and small bilateral effusion consistent with mild congestive heart failure.  She was started on broad-spectrum antibiotics.  She became hypotensive and required vasopressors and was admitted to ICU on 1/5.  She was also placed on BiPAP due to increased oxygen requirement.  Further work-up revealed CHF exacerbation with EF less than 20%.  She was found to be in cardiogenic shock and cardiology was managing patient on milrinone drip and IV Lasix.  She also had A. fib RVR and underwent DC cardioversion.  Underwent ischemic work-up which revealed 70% mid LAD stenosis.  She was transferred to [facility] 1/12.  New events last 24 hours / Subjective: No complaints on examination.  Denies chest pain or shortness of breath today. States that UOP is inaccurate due to issues with purewick.  She denies any fevers or chills, no cough, nausea, vomiting, diarrhea, abdominal pain or dysuria.  Assessment & Plan:   Principal Problem:   Acute respiratory failure with hypoxia (HCC) Active Problems:   Atherosclerosis of native coronary artery of native heart with stable angina pectoris (HCC)   Diabetes mellitus (HCC)   Atrial fibrillation (HCC)   Pressure injury of skin   Dilated cardiomyopathy (HCC)   Acute systolic heart failure  (HCC)   Cardiogenic shock (HCC)   Acute hypoxemic respiratory failure -Required BiPAP on admission -Resolved and remains on room air  Cardiogenic shock, acute on chronic systolic heart failure Non-ischemic cardiomyopathy -Status post heart cath 1/6 -Now weaned off vasopressors, milrinone - remove PICC line  -Continue IV Lasix, Toprol -Cardiology following  A. fib RVR -Status post DC cardioversion 1/10 -Continue amiodarone, eliquis -Irreg rhythm today, check EKG   CAD -Continue Crestor  Diabetes mellitus type 2, uncontrolled with hyperglycemia -A1c 9.8 -Continue Levemir, sliding scale insulin - decrease dose today   CKD stage IIIb -Baseline creatinine 1.5 in December 2022 -Remains stable  Hypothyroidism -Continue Synthroid  Hypokalemia -Replace, check magnesium  Chronic leukocytosis -States that she was previously seen by Dr. Rogue Bussing, oncology, for leukocytosis work up, which were unremarkable. Note reviewed from June 2021. At that time, suspected benign causes, inflammation. Myeloproliferative work up negative.  -Remains afebrile   In agreement with assessment of the pressure ulcer as below:  Pressure Injury 07/14/21 Perineum Left;Right Stage 2 -  Partial thickness loss of dermis presenting as a shallow open injury with a red, pink wound bed without slough. (Active)  07/14/21 0159  Location: Perineum  Location Orientation: Left;Right  Staging: Stage 2 -  Partial thickness loss of dermis presenting as a shallow open injury with a red, pink wound bed without slough.  Wound Description (Comments):   Present on Admission: Yes     Pressure Injury 07/14/21 Sacrum Mid Stage 2 -  Partial thickness loss of dermis presenting as a shallow open injury with a red, pink wound bed without slough. (Active)  07/14/21 0201  Location: Sacrum  Location Orientation: Mid  Staging: Stage 2 -  Partial thickness loss of dermis presenting as a shallow open injury with a red, pink  wound bed without slough.  Wound Description (Comments):   Present on Admission:          DVT prophylaxis:   apixaban (ELIQUIS) tablet 5 mg  Code Status: Full Family Communication: None at bedside Disposition Plan:  Status is: Inpatient  Remains inpatient appropriate because: Remains on IV Lasix    Antimicrobials:  Anti-infectives (From admission, onward)    Start     Dose/Rate Route Frequency Ordered Stop   07/14/21 2100  azithromycin (ZITHROMAX) 500 mg in sodium chloride 0.9 % 250 mL IVPB  Status:  Discontinued        500 mg 250 mL/hr over 60 Minutes Intravenous Every 24 hours 07/14/21 0416 07/14/21 1014   07/14/21 2000  cefTRIAXone (ROCEPHIN) 1 g in sodium chloride 0.9 % 100 mL IVPB  Status:  Discontinued        1 g 200 mL/hr over 30 Minutes Intravenous Every 24 hours 07/14/21 0416 07/14/21 1014   07/13/21 1945  cefTRIAXone (ROCEPHIN) 1 g in sodium chloride 0.9 % 100 mL IVPB        1 g 200 mL/hr over 30 Minutes Intravenous  Once 07/13/21 1935 07/13/21 2012   07/13/21 1945  azithromycin (ZITHROMAX) 500 mg in sodium chloride 0.9 % 250 mL IVPB        500 mg 250 mL/hr over 60 Minutes Intravenous  Once 07/13/21 1935 07/13/21 2053        Objective: Vitals:   07/21/21 2333 07/22/21 0337 07/22/21 0500 07/22/21 1154  BP: 94/63 102/72  111/73  Pulse: 73 72  98  Resp: 20 20  18   Temp: 97.9 F (36.6 C) 98.1 F (36.7 C)  98.3 F (36.8 C)  TempSrc:    Oral  SpO2: 97% 98%  100%  Weight:   93.2 kg   Height:        Intake/Output Summary (Last 24 hours) at 07/22/2021 1218 Last data filed at 07/22/2021 0900 Gross per 24 hour  Intake 739.47 ml  Output --  Net 739.47 ml    Filed Weights   07/20/21 0454 07/21/21 0456 07/22/21 0500  Weight: 95.8 kg 93.8 kg 93.2 kg    Examination:  General exam: Appears calm and comfortable  Respiratory system: Clear to auscultation. Respiratory effort normal. No respiratory distress. No conversational dyspnea.  Cardiovascular  system: S1 & S2 heard, irreg rhythm. No murmurs.  Bilateral pedal edema. Gastrointestinal system: Abdomen is nondistended, soft and nontender. Normal bowel sounds heard. Central nervous system: Alert and oriented. No focal neurological deficits. Speech clear.  Extremities: Symmetric in appearance  Psychiatry: Judgement and insight appear normal. Mood & affect appropriate.   Data Reviewed: I have personally reviewed following labs and imaging studies  CBC: Recent Labs  Lab 07/18/21 0522 07/19/21 0513 07/20/21 0834 07/21/21 0337 07/22/21 0542  WBC 13.5* 14.7* 11.2* 15.4* 20.3*  HGB 14.0 14.2 14.4 14.6 14.6  HCT 41.8 42.7 42.5 43.9 44.4  MCV 96.3 94.1 94.7 95.9 94.9  PLT 171 167 111* 113* 126*    Basic Metabolic Panel: Recent Labs  Lab 07/16/21 0401 07/17/21 0249 07/17/21 1633 07/18/21 0522 07/19/21 0513 07/20/21 0450 07/21/21 0337 07/22/21 0500 07/22/21 0542  NA  --  133*   < > 133* 134* 136 137  --  137  K  --  3.4*   < > 3.6  3.6 3.3* 3.9  --  3.2*  CL  --  100   < > 99 100 101 99  --  98  CO2  --  20*   < > 24 23 25 27   --  30  GLUCOSE  --  195*   < > 146* 151* 220* 160*  --  52*  BUN  --  59*   < > 57* 58* 57* 62*  --  69*  CREATININE  --  1.69*   < > 1.66* 1.75* 1.70* 1.48*  --  1.51*  CALCIUM  --  7.9*   < > 7.7* 8.2* 8.0* 8.6*  --  8.8*  MG 1.9 1.9  --   --  1.7  --   --  1.8  --   PHOS 3.9 2.3*  --  2.8 2.5  --   --   --   --    < > = values in this interval not displayed.    GFR: Estimated Creatinine Clearance: 39.5 mL/min (A) (by C-G formula based on SCr of 1.51 mg/dL (H)). Liver Function Tests: Recent Labs  Lab 07/19/21 0513  AST 84*  ALT 119*  ALKPHOS 131*  BILITOT 0.9  PROT 6.4*  ALBUMIN 3.3*    No results for input(s): LIPASE, AMYLASE in the last 168 hours. No results for input(s): AMMONIA in the last 168 hours. Coagulation Profile: Recent Labs  Lab 07/19/21 1320  INR 1.3*    Cardiac Enzymes: No results for input(s): CKTOTAL, CKMB,  CKMBINDEX, TROPONINI in the last 168 hours. BNP (last 3 results) No results for input(s): PROBNP in the last 8760 hours. HbA1C: No results for input(s): HGBA1C in the last 72 hours. CBG: Recent Labs  Lab 07/21/21 2035 07/21/21 2330 07/22/21 0334 07/22/21 0758 07/22/21 1153  GLUCAP 111* 107* 84 121* 188*    Lipid Profile: No results for input(s): CHOL, HDL, LDLCALC, TRIG, CHOLHDL, LDLDIRECT in the last 72 hours. Thyroid Function Tests: No results for input(s): TSH, T4TOTAL, FREET4, T3FREE, THYROIDAB in the last 72 hours. Anemia Panel: No results for input(s): VITAMINB12, FOLATE, FERRITIN, TIBC, IRON, RETICCTPCT in the last 72 hours. Sepsis Labs: No results for input(s): PROCALCITON, LATICACIDVEN in the last 168 hours.   Recent Results (from the past 240 hour(s))  Resp Panel by RT-PCR (Flu A&B, Covid) Nasopharyngeal Swab     Status: None   Collection Time: 07/13/21  5:06 PM   Specimen: Nasopharyngeal Swab; Nasopharyngeal(NP) swabs in vial transport medium  Result Value Ref Range Status   SARS Coronavirus 2 by RT PCR NEGATIVE NEGATIVE Final    Comment: (NOTE) SARS-CoV-2 target nucleic acids are NOT DETECTED.  The SARS-CoV-2 RNA is generally detectable in upper respiratory specimens during the acute phase of infection. The lowest concentration of SARS-CoV-2 viral copies this assay can detect is 138 copies/mL. A negative result does not preclude SARS-Cov-2 infection and should not be used as the sole basis for treatment or other patient management decisions. A negative result may occur with  improper specimen collection/handling, submission of specimen other than nasopharyngeal swab, presence of viral mutation(s) within the areas targeted by this assay, and inadequate number of viral copies(<138 copies/mL). A negative result must be combined with clinical observations, patient history, and epidemiological information. The expected result is Negative.  Fact Sheet for  Patients:  EntrepreneurPulse.com.au  Fact Sheet for Healthcare Providers:  IncredibleEmployment.be  This test is no t yet approved or cleared by the [place] FDA and  has been  authorized for detection and/or diagnosis of SARS-CoV-2 by FDA under an Emergency Use Authorization (EUA). This EUA will remain  in effect (meaning this test can be used) for the duration of the COVID-19 declaration under Section 564(b)(1) of the Act, 21 U.S.C.section 360bbb-3(b)(1), unless the authorization is terminated  or revoked sooner.       Influenza A by PCR NEGATIVE NEGATIVE Final   Influenza B by PCR NEGATIVE NEGATIVE Final    Comment: (NOTE) The Xpert Xpress SARS-CoV-2/FLU/RSV plus assay is intended as an aid in the diagnosis of influenza from Nasopharyngeal swab specimens and should not be used as a sole basis for treatment. Nasal washings and aspirates are unacceptable for Xpert Xpress SARS-CoV-2/FLU/RSV testing.  Fact Sheet for Patients: EntrepreneurPulse.com.au  Fact Sheet for Healthcare Providers: IncredibleEmployment.be  This test is not yet approved or cleared by the [place] FDA and has been authorized for detection and/or diagnosis of SARS-CoV-2 by FDA under an Emergency Use Authorization (EUA). This EUA will remain in effect (meaning this test can be used) for the duration of the COVID-19 declaration under Section 564(b)(1) of the Act, 21 U.S.C. section 360bbb-3(b)(1), unless the authorization is terminated or revoked.  Performed at [facility], [address] [place], [place] [address]   MRSA Next Gen by PCR, Nasal     Status: Abnormal   Collection Time: 07/14/21  1:19 AM   Specimen: Nasal Mucosa; Nasal Swab  Result Value Ref Range Status   MRSA by PCR Next Gen DETECTED (A) NOT DETECTED Final    Comment: CRITICAL RESULT CALLED TO, READ BACK BY AND VERIFIED WITH: MABIS ARTIS @ 5638 ON  07/14/2021.Marland KitchenMarland KitchenTKR (NOTE) The GeneXpert MRSA Assay (FDA approved for NASAL specimens only), is one component of a comprehensive MRSA colonization surveillance program. It is not intended to diagnose MRSA infection nor to guide or monitor treatment for MRSA infections. Test performance is not FDA approved in patients less than 41 years old. Performed at [facility], [address] [place], [place] [address]   CULTURE, BLOOD (ROUTINE X 2) w Reflex to ID Panel     Status: None   Collection Time: 07/14/21  5:06 AM   Specimen: BLOOD LEFT HAND  Result Value Ref Range Status   Specimen Description BLOOD LEFT HAND  Final   Special Requests   Final    BOTTLES DRAWN AEROBIC AND ANAEROBIC Blood Culture results may not be optimal due to an inadequate volume of blood received in culture bottles   Culture   Final    NO GROWTH 5 DAYS Performed at [facility], [address] [place], [place] [address]    Report Status 07/19/2021 FINAL  Final  CULTURE, BLOOD (ROUTINE X 2) w Reflex to ID Panel     Status: None   Collection Time: 07/14/21  5:07 AM   Specimen: BLOOD LEFT FOREARM  Result Value Ref Range Status   Specimen Description BLOOD LEFT FOREARM  Final   Special Requests   Final    BOTTLES DRAWN AEROBIC AND ANAEROBIC Blood Culture adequate volume   Culture   Final    NO GROWTH 5 DAYS Performed at [facility], [address], [place], [place] [address]    Report Status 07/19/2021 FINAL  Final       Radiology Studies: No results found.    Scheduled Meds:  amiodarone  400 mg Oral BID   apixaban  5 mg Oral BID   furosemide  80 mg Intravenous BID  insulin aspart  0-6 Units Subcutaneous TID WC   insulin detemir  15 Units Subcutaneous Q12H   levothyroxine  200 mcg Oral Q0600   metoprolol succinate  12.5 mg Oral Daily   potassium chloride  40 mEq Oral Once   rosuvastatin  10 mg Oral Daily   sodium chloride flush  10-40 mL Intracatheter Q12H   Continuous  Infusions:  sodium chloride Stopped (07/18/21 1619)     LOS: 9 days     Dessa Phi, DO [facility] 07/22/2021, 12:18 PM   Available via Epic secure chat 7am-7pm After these hours, please refer to coverage provider listed on [email or web address]

## 2021-07-22 NOTE — Progress Notes (Signed)
Physical Therapy Treatment Patient Details Name: Michele [name] MRN: [id number] DOB: [birth year] Today's Date: 07/22/2021   History of Present Illness 66 y.o female with significant PMH of OSA on CPAP, hypothyroidism, diabetes mellitus, hypertension, CHF, DCIS, neutrophilia, hyperlipidemia, CAD, who presented to the ED with chief complaints of  shortness of breath x1 week. Pt being tx'ed for Acute Hypoxic Respiratory Failure secondary to Pulmonary Edema, Underlying Pneumonia and possible PE, started on heparin gtt. Pt s/p cath on 1/6, improved status on 1/7 and approved for therapy evaluation 24h post-start of amiodarone.    PT Comments    Pt received upright in bed agreeable to PT. On RA, SPO2 >95% at rest and with ambulation. Resting HR in low to mid 90's BPM. Able to transfer to EoB with supervision and use of bed rails, stand, and ambulate with RW. Did require VC's for safe hand placement with standing. Tolerated ambulating 160' with minor SOB that improves with seated rest in recliner. Pt is stable with ambulation, consistent step through pattern with safe sequencing. Max HR up to 106 BPM. Pt will benefit from [facility] PT due to deficits in endurance for OOB mobility.     Recommendations for follow up therapy are one component of a multi-disciplinary discharge planning process, led by the attending physician.  Recommendations may be updated based on patient status, additional functional criteria and insurance authorization.  Follow Up Recommendations  Home health PT     Assistance Recommended at Discharge Frequent or constant Supervision/Assistance  Patient can return home with the following A little help with walking and/or transfers;A little help with bathing/dressing/bathroom;Assistance with cooking/housework;Assist for transportation;Help with stairs or ramp for entrance   Equipment Recommendations  None recommended by PT    Recommendations for Other Services       Precautions /  Restrictions Precautions Precautions: Fall Restrictions Weight Bearing Restrictions: No     Mobility  Bed Mobility Overal bed mobility: Needs Assistance Bed Mobility: Supine to Sit     Supine to sit: Supervision;HOB elevated     General bed mobility comments: Does rely on hospital grab bars Patient Response: Cooperative  Transfers Overall transfer level: Needs assistance Equipment used: Rolling walker (2 wheels) Transfers: Sit to/from Stand Sit to Stand: Supervision                Ambulation/Gait Ambulation/Gait assistance: Supervision Gait Distance (Feet): 160 Feet Assistive device: Rolling walker (2 wheels) Gait Pattern/deviations: Decreased stride length Gait velocity: slightly decreased         Stairs             Wheelchair Mobility    Modified Rankin (Stroke Patients Only)       Balance Overall balance assessment: Needs assistance Sitting-balance support: Feet supported Sitting balance-Leahy Scale: Good     Standing balance support: During functional activity;Bilateral upper extremity supported;Reliant on assistive device for balance Standing balance-Leahy Scale: Fair                              Cognition Arousal/Alertness: Awake/alert Behavior During Therapy: WFL for tasks assessed/performed Overall Cognitive Status: Within Functional Limits for tasks assessed                                          Exercises General Exercises - Lower Extremity Ankle Circles/Pumps: AROM;Both;15 reps;Supine Long Arc Quad: AROM;Seated;Strengthening;Both;10 reps  Straight Leg Raises: AROM;Strengthening;Both;10 reps;Supine Hip Flexion/Marching: AROM;Seated;Strengthening;Both;10 reps    General Comments General comments (skin integrity, edema, etc.): Maintained SPo2 >95% on RA. HR trending mid 90's at rest up to 106 BPM with ambulation.      Pertinent Vitals/Pain Pain Assessment: No/denies pain    Home Living                           Prior Function            PT Goals (current goals can now be found in the care plan section) Acute Rehab PT Goals Patient Stated Goal: get better, go home PT Goal Formulation: With patient Time For Goal Achievement: 07/31/21 Potential to Achieve Goals: Good Progress towards PT goals: Progressing toward goals    Frequency    Min 2X/week      PT Plan Current plan remains appropriate    Co-evaluation              AM-PAC PT "6 Clicks" Mobility   Outcome Measure  Help needed turning from your back to your side while in a flat bed without using bedrails?: A Little Help needed moving from lying on your back to sitting on the side of a flat bed without using bedrails?: A Little Help needed moving to and from a bed to a chair (including a wheelchair)?: A Little Help needed standing up from a chair using your arms (e.g., wheelchair or bedside chair)?: A Little Help needed to walk in hospital room?: A Little Help needed climbing 3-5 steps with a railing? : A Little 6 Click Score: 18    End of Session Equipment Utilized During Treatment: Gait belt Activity Tolerance: Patient tolerated treatment well Patient left: in chair;with family/visitor present;with call bell/phone within reach Nurse Communication: Mobility status PT Visit Diagnosis: Unsteadiness on feet (R26.81);Muscle weakness (generalized) (M62.81);Difficulty in walking, not elsewhere classified (R26.2)     Time: 4332-9518 PT Time Calculation (min) (ACUTE ONLY): 18 min  Charges:  $Therapeutic Exercise: 8-22 mins                     Salem Caster. Fairly IV, PT, DPT Physical Therapist- [facility]  07/22/2021, 12:14 PM

## 2021-07-22 NOTE — Care Management Important Message (Signed)
Important Message  Patient Details  Name: Michele [name] MRN: [id number] Date of Birth: 23-Jan-1956   Medicare Important Message Given:  Yes     Dannette Barbara 07/22/2021, 4:20 PM

## 2021-07-23 DIAGNOSIS — I4891 Unspecified atrial fibrillation: Secondary | ICD-10-CM | POA: Diagnosis not present

## 2021-07-23 DIAGNOSIS — I5021 Acute systolic (congestive) heart failure: Secondary | ICD-10-CM | POA: Diagnosis not present

## 2021-07-23 DIAGNOSIS — I2510 Atherosclerotic heart disease of native coronary artery without angina pectoris: Secondary | ICD-10-CM

## 2021-07-23 LAB — MAGNESIUM: Magnesium: 2 mg/dL (ref 1.7–2.4)

## 2021-07-23 LAB — CBC
HCT: 46.6 % — ABNORMAL HIGH (ref 36.0–46.0)
Hemoglobin: 15.4 g/dL — ABNORMAL HIGH (ref 12.0–15.0)
MCH: 31.8 pg (ref 26.0–34.0)
MCHC: 33 g/dL (ref 30.0–36.0)
MCV: 96.1 fL (ref 80.0–100.0)
Platelets: 117 10*3/uL — ABNORMAL LOW (ref 150–400)
RBC: 4.85 MIL/uL (ref 3.87–5.11)
RDW: 15.5 % (ref 11.5–15.5)
WBC: 12.8 10*3/uL — ABNORMAL HIGH (ref 4.0–10.5)
nRBC: 0 % (ref 0.0–0.2)

## 2021-07-23 LAB — GLUCOSE, CAPILLARY
Glucose-Capillary: 165 mg/dL — ABNORMAL HIGH (ref 70–99)
Glucose-Capillary: 208 mg/dL — ABNORMAL HIGH (ref 70–99)
Glucose-Capillary: 215 mg/dL — ABNORMAL HIGH (ref 70–99)
Glucose-Capillary: 218 mg/dL — ABNORMAL HIGH (ref 70–99)
Glucose-Capillary: 297 mg/dL — ABNORMAL HIGH (ref 70–99)
Glucose-Capillary: 299 mg/dL — ABNORMAL HIGH (ref 70–99)

## 2021-07-23 LAB — BASIC METABOLIC PANEL
Anion gap: 11 (ref 5–15)
BUN: 59 mg/dL — ABNORMAL HIGH (ref 8–23)
CO2: 29 mmol/L (ref 22–32)
Calcium: 9.2 mg/dL (ref 8.9–10.3)
Chloride: 99 mmol/L (ref 98–111)
Creatinine, Ser: 1.1 mg/dL — ABNORMAL HIGH (ref 0.44–1.00)
GFR, Estimated: 56 mL/min — ABNORMAL LOW (ref >60)
Glucose, Bld: 199 mg/dL — ABNORMAL HIGH (ref 70–99)
Potassium: 3.7 mmol/L (ref 3.5–5.1)
Sodium: 139 mmol/L (ref 135–145)

## 2021-07-23 MED ORDER — TORSEMIDE 20 MG PO TABS
40.0000 mg | ORAL_TABLET | Freq: Two times a day (BID) | ORAL | Status: DC
  Administered 2021-07-23 – 2021-07-24 (×3): 40 mg via ORAL
  Filled 2021-07-23 (×3): qty 2

## 2021-07-23 NOTE — Progress Notes (Signed)
PROGRESS NOTE    Michele [name]  YQI:[id number] DOB: 12-31-1955 DOA: 07/13/2021 PCP: Jerrol Banana., MD     Brief Narrative:  Michele [name] is a 66 y.o female with significant PMH of OSA on CPAP, hypothyroidism, diabetes mellitus, hypertension, CHF, DCIS, neutrophilia, hyperlipidemia, CAD, who presented to the ED with chief complaints of  shortness of breath x1 week. Chest x-ray was obtained showed cardiac enlargement with mild vascular congestion and mild left lower lobe atelectasis or infiltrate.  CTA angio chest showed incomplete opacification of segmental branches of the right lower lobe pulmonary artery, PE could not be excluded.  Cardiomegaly with scattered groundglass opacities and small bilateral effusion consistent with mild congestive heart failure.  She was started on broad-spectrum antibiotics.  She became hypotensive and required vasopressors and was admitted to ICU on 1/5.  She was also placed on BiPAP due to increased oxygen requirement.  Further work-up revealed CHF exacerbation with EF less than 20%.  She was found to be in cardiogenic shock and cardiology was managing patient on milrinone drip and IV Lasix.  She also had A. fib RVR and underwent DC cardioversion.  Underwent ischemic work-up which revealed 70% mid LAD stenosis.  She was transferred to [facility] 1/12.  New events last 24 hours / Subjective: Doing well this morning, was able to ambulate around the hallway yesterday.  Has no complaints today.  Assessment & Plan:   Principal Problem:   Acute respiratory failure with hypoxia (HCC) Active Problems:   Atherosclerosis of native coronary artery of native heart with stable angina pectoris (HCC)   Diabetes mellitus (HCC)   Atrial fibrillation (HCC)   Pressure injury of skin   Dilated cardiomyopathy (HCC)   Acute systolic heart failure (HCC)   Cardiogenic shock (HCC)   Acute hypoxemic respiratory failure -Required BiPAP on admission -Resolved and remains on room  air  Cardiogenic shock, acute on chronic systolic heart failure Non-ischemic cardiomyopathy -Status post heart cath 1/6 -Now weaned off vasopressors, milrinone  -Continue IV Lasix, Toprol, losartan -Cardiology following  A. fib RVR -Status post DC cardioversion 1/10 -Continue amiodarone, eliquis -In normal sinus rhythm today  CAD -Continue Crestor  Diabetes mellitus type 2, uncontrolled with hyperglycemia -A1c 9.8 -Continue Levemir, sliding scale insulin  CKD stage IIIa -Baseline creatinine 1.5 in December 2022 -Remains stable  Hypothyroidism -Continue Synthroid  Chronic leukocytosis -States that she was previously seen by Dr. Rogue Bussing, oncology, for leukocytosis work up, which were unremarkable. Note reviewed from June 2021. At that time, suspected benign causes, inflammation. Myeloproliferative work up negative.  -Remains afebrile   In agreement with assessment of the pressure ulcer as below:  Pressure Injury 07/14/21 Perineum Left;Right Stage 2 -  Partial thickness loss of dermis presenting as a shallow open injury with a red, pink wound bed without slough. (Active)  07/14/21 0159  Location: Perineum  Location Orientation: Left;Right  Staging: Stage 2 -  Partial thickness loss of dermis presenting as a shallow open injury with a red, pink wound bed without slough.  Wound Description (Comments):   Present on Admission: Yes     Pressure Injury 07/14/21 Sacrum Mid Stage 2 -  Partial thickness loss of dermis presenting as a shallow open injury with a red, pink wound bed without slough. (Active)  07/14/21 0201  Location: Sacrum  Location Orientation: Mid  Staging: Stage 2 -  Partial thickness loss of dermis presenting as a shallow open injury with a red, pink wound bed without slough.  Wound Description (Comments):  Present on Admission:          DVT prophylaxis:   apixaban (ELIQUIS) tablet 5 mg  Code Status: Full Family Communication: None at  bedside Disposition Plan:  Status is: Inpatient  Remains inpatient appropriate because: Remains on IV Lasix    Antimicrobials:  Anti-infectives (From admission, onward)    Start     Dose/Rate Route Frequency Ordered Stop   07/14/21 2100  azithromycin (ZITHROMAX) 500 mg in sodium chloride 0.9 % 250 mL IVPB  Status:  Discontinued        500 mg 250 mL/hr over 60 Minutes Intravenous Every 24 hours 07/14/21 0416 07/14/21 1014   07/14/21 2000  cefTRIAXone (ROCEPHIN) 1 g in sodium chloride 0.9 % 100 mL IVPB  Status:  Discontinued        1 g 200 mL/hr over 30 Minutes Intravenous Every 24 hours 07/14/21 0416 07/14/21 1014   07/13/21 1945  cefTRIAXone (ROCEPHIN) 1 g in sodium chloride 0.9 % 100 mL IVPB        1 g 200 mL/hr over 30 Minutes Intravenous  Once 07/13/21 1935 07/13/21 2012   07/13/21 1945  azithromycin (ZITHROMAX) 500 mg in sodium chloride 0.9 % 250 mL IVPB        500 mg 250 mL/hr over 60 Minutes Intravenous  Once 07/13/21 1935 07/13/21 2053        Objective: Vitals:   07/23/21 0045 07/23/21 0500 07/23/21 0521 07/23/21 0749  BP: (!) 76/66  104/72 94/72  Pulse: 72  66 67  Resp: 16  18 17   Temp: 98 F (36.7 C)  98.1 F (36.7 C) 98.4 F (36.9 C)  TempSrc:      SpO2: (!) 89%  97% 97%  Weight:  91.1 kg    Height:        Intake/Output Summary (Last 24 hours) at 07/23/2021 1049 Last data filed at 07/23/2021 0900 Gross per 24 hour  Intake 240 ml  Output --  Net 240 ml    Filed Weights   07/21/21 0456 07/22/21 0500 07/23/21 0500  Weight: 93.8 kg 93.2 kg 91.1 kg    Examination:  General exam: Appears calm and comfortable  Respiratory system: Clear to auscultation. Respiratory effort normal. No respiratory distress. No conversational dyspnea. On room air  Cardiovascular system: S1 & S2 heard, RRR. No murmurs.  Trace pedal edema. Gastrointestinal system: Abdomen is nondistended, soft and nontender. Normal bowel sounds heard. Central nervous system: Alert and  oriented. No focal neurological deficits. Speech clear.  Extremities: Symmetric in appearance  Psychiatry: Judgement and insight appear normal. Mood & affect appropriate.   Data Reviewed: I have personally reviewed following labs and imaging studies  CBC: Recent Labs  Lab 07/19/21 0513 07/20/21 0834 07/21/21 0337 07/22/21 0542 07/23/21 0451  WBC 14.7* 11.2* 15.4* 20.3* 12.8*  HGB 14.2 14.4 14.6 14.6 15.4*  HCT 42.7 42.5 43.9 44.4 46.6*  MCV 94.1 94.7 95.9 94.9 96.1  PLT 167 111* 113* 126* 117*    Basic Metabolic Panel: Recent Labs  Lab 07/17/21 0249 07/17/21 1633 07/18/21 0522 07/19/21 0513 07/20/21 0450 07/21/21 0337 07/22/21 0500 07/22/21 0542 07/23/21 0451  NA 133*   < > 133* 134* 136 137  --  137 139  K 3.4*   < > 3.6 3.6 3.3* 3.9  --  3.2* 3.7  CL 100   < > 99 100 101 99  --  98 99  CO2 20*   < > 24 23 25 27   --  30 29  GLUCOSE 195*   < > 146* 151* 220* 160*  --  52* 199*  BUN 59*   < > 57* 58* 57* 62*  --  69* 59*  CREATININE 1.69*   < > 1.66* 1.75* 1.70* 1.48*  --  1.51* 1.10*  CALCIUM 7.9*   < > 7.7* 8.2* 8.0* 8.6*  --  8.8* 9.2  MG 1.9  --   --  1.7  --   --  1.8  --  2.0  PHOS 2.3*  --  2.8 2.5  --   --   --   --   --    < > = values in this interval not displayed.    GFR: Estimated Creatinine Clearance: 53.5 mL/min (A) (by C-G formula based on SCr of 1.1 mg/dL (H)). Liver Function Tests: Recent Labs  Lab 07/19/21 0513  AST 84*  ALT 119*  ALKPHOS 131*  BILITOT 0.9  PROT 6.4*  ALBUMIN 3.3*    No results for input(s): LIPASE, AMYLASE in the last 168 hours. No results for input(s): AMMONIA in the last 168 hours. Coagulation Profile: Recent Labs  Lab 07/19/21 1320  INR 1.3*    Cardiac Enzymes: No results for input(s): CKTOTAL, CKMB, CKMBINDEX, TROPONINI in the last 168 hours. BNP (last 3 results) No results for input(s): PROBNP in the last 8760 hours. HbA1C: No results for input(s): HGBA1C in the last 72 hours. CBG: Recent Labs  Lab  07/22/21 1658 07/22/21 2004 07/23/21 0047 07/23/21 0525 07/23/21 0748  GLUCAP 188* 276* 299* 208* 165*    Lipid Profile: No results for input(s): CHOL, HDL, LDLCALC, TRIG, CHOLHDL, LDLDIRECT in the last 72 hours. Thyroid Function Tests: No results for input(s): TSH, T4TOTAL, FREET4, T3FREE, THYROIDAB in the last 72 hours. Anemia Panel: No results for input(s): VITAMINB12, FOLATE, FERRITIN, TIBC, IRON, RETICCTPCT in the last 72 hours. Sepsis Labs: No results for input(s): PROCALCITON, LATICACIDVEN in the last 168 hours.   Recent Results (from the past 240 hour(s))  Resp Panel by RT-PCR (Flu A&B, Covid) Nasopharyngeal Swab     Status: None   Collection Time: 07/13/21  5:06 PM   Specimen: Nasopharyngeal Swab; Nasopharyngeal(NP) swabs in vial transport medium  Result Value Ref Range Status   SARS Coronavirus 2 by RT PCR NEGATIVE NEGATIVE Final    Comment: (NOTE) SARS-CoV-2 target nucleic acids are NOT DETECTED.  The SARS-CoV-2 RNA is generally detectable in upper respiratory specimens during the acute phase of infection. The lowest concentration of SARS-CoV-2 viral copies this assay can detect is 138 copies/mL. A negative result does not preclude SARS-Cov-2 infection and should not be used as the sole basis for treatment or other patient management decisions. A negative result may occur with  improper specimen collection/handling, submission of specimen other than nasopharyngeal swab, presence of viral mutation(s) within the areas targeted by this assay, and inadequate number of viral copies(<138 copies/mL). A negative result must be combined with clinical observations, patient history, and epidemiological information. The expected result is Negative.  Fact Sheet for Patients:  EntrepreneurPulse.com.au  Fact Sheet for Healthcare Providers:  IncredibleEmployment.be  This test is no t yet approved or cleared by the [place] FDA and   has been authorized for detection and/or diagnosis of SARS-CoV-2 by FDA under an Emergency Use Authorization (EUA). This EUA will remain  in effect (meaning this test can be used) for the duration of the COVID-19 declaration under Section 564(b)(1) of the Act, 21 U.S.C.section 360bbb-3(b)(1), unless the authorization  is terminated  or revoked sooner.       Influenza A by PCR NEGATIVE NEGATIVE Final   Influenza B by PCR NEGATIVE NEGATIVE Final    Comment: (NOTE) The Xpert Xpress SARS-CoV-2/FLU/RSV plus assay is intended as an aid in the diagnosis of influenza from Nasopharyngeal swab specimens and should not be used as a sole basis for treatment. Nasal washings and aspirates are unacceptable for Xpert Xpress SARS-CoV-2/FLU/RSV testing.  Fact Sheet for Patients: EntrepreneurPulse.com.au  Fact Sheet for Healthcare Providers: IncredibleEmployment.be  This test is not yet approved or cleared by the [place] FDA and has been authorized for detection and/or diagnosis of SARS-CoV-2 by FDA under an Emergency Use Authorization (EUA). This EUA will remain in effect (meaning this test can be used) for the duration of the COVID-19 declaration under Section 564(b)(1) of the Act, 21 U.S.C. section 360bbb-3(b)(1), unless the authorization is terminated or revoked.  Performed at [facility], [address] [place], [place] [address]   MRSA Next Gen by PCR, Nasal     Status: Abnormal   Collection Time: 07/14/21  1:19 AM   Specimen: Nasal Mucosa; Nasal Swab  Result Value Ref Range Status   MRSA by PCR Next Gen DETECTED (A) NOT DETECTED Final    Comment: CRITICAL RESULT CALLED TO, READ BACK BY AND VERIFIED WITH: MABIS ARTIS @ 5284 ON 07/14/2021.Marland KitchenMarland KitchenTKR (NOTE) The GeneXpert MRSA Assay (FDA approved for NASAL specimens only), is one component of a comprehensive MRSA colonization surveillance program. It is not intended to diagnose MRSA  infection nor to guide or monitor treatment for MRSA infections. Test performance is not FDA approved in patients less than 49 years old. Performed at [facility], [address] [place], [place] [address]   CULTURE, BLOOD (ROUTINE X 2) w Reflex to ID Panel     Status: None   Collection Time: 07/14/21  5:06 AM   Specimen: BLOOD LEFT HAND  Result Value Ref Range Status   Specimen Description BLOOD LEFT HAND  Final   Special Requests   Final    BOTTLES DRAWN AEROBIC AND ANAEROBIC Blood Culture results may not be optimal due to an inadequate volume of blood received in culture bottles   Culture   Final    NO GROWTH 5 DAYS Performed at [facility], [address] [place], [place] [address]    Report Status 07/19/2021 FINAL  Final  CULTURE, BLOOD (ROUTINE X 2) w Reflex to ID Panel     Status: None   Collection Time: 07/14/21  5:07 AM   Specimen: BLOOD LEFT FOREARM  Result Value Ref Range Status   Specimen Description BLOOD LEFT FOREARM  Final   Special Requests   Final    BOTTLES DRAWN AEROBIC AND ANAEROBIC Blood Culture adequate volume   Culture   Final    NO GROWTH 5 DAYS Performed at [facility], [address], [place],  [address]    Report Status 07/19/2021 FINAL  Final       Radiology Studies: No results found.    Scheduled Meds:  amiodarone  400 mg Oral BID   apixaban  5 mg Oral BID   furosemide  80 mg Intravenous BID   insulin aspart  0-6 Units Subcutaneous TID WC   insulin detemir  15 Units Subcutaneous Q12H   levothyroxine  200 mcg Oral Q0600   losartan  12.5 mg Oral QPM   metoprolol succinate  12.5 mg Oral Daily   rosuvastatin  10 mg Oral Daily   sodium chloride flush  10-40 mL Intracatheter Q12H   Continuous Infusions:  sodium chloride Stopped (07/18/21 1619)     LOS: 10 days     Dessa Phi, DO [facility] 07/23/2021, 10:49 AM   Available via Epic secure chat 7am-7pm After these hours, please  refer to coverage provider listed on [email or web address]

## 2021-07-23 NOTE — Progress Notes (Addendum)
Occupational Therapy Treatment Patient Details Name: Michele [name] MRN: [id number] DOB: May 23, 1956 Today's Date: 07/23/2021   History of present illness Pt. is a 66 y.o female with significant PMH of OSA on CPAP, hypothyroidism, diabetes mellitus, hypertension, CHF, DCIS, neutrophilia, hyperlipidemia, CAD, who presented to the ED with chief complaints of  shortness of breath x1 week. Pt being tx'ed for Acute Hypoxic Respiratory Failure secondary to Pulmonary Edema, Underlying Pneumonia and possible PE, started on heparin gtt. Pt s/p cath on 1/6, improved status on 1/7 and approved for therapy evaluation 24h post-start of amiodarone.   OT comments  Pt. performed toileting tranfers to the [facility] with supervision. Pt. was independent with toilet hygiene care, and hand hygiene. Pt. Required minA with LE dressing with A/E. Pt. reports having difficulty crossing her leg when donning her socks without A/E. Pt. Reports tasking a shower this afternoon. Pt. education was provided about energy conservation strategies for IADLs in anticipation for returning home. Pt. Continues to benefit from OT services for ADL training, A/E training, and pt. education about home modification, and DME.    Recommendations for follow up therapy are one component of a multi-disciplinary discharge planning process, led by the attending physician.  Recommendations may be updated based on patient status, additional functional criteria and insurance authorization.    Follow Up Recommendations  Home health OT    Assistance Recommended at Discharge Intermittent Supervision/Assistance  Patient can return home with the following  Assistance with cooking/housework;Assist for transportation   Equipment Recommendations       Recommendations for Other Services      Precautions / Restrictions Precautions Precautions: Fall Restrictions Weight Bearing Restrictions: No       Mobility Bed Mobility               General bed  mobility comments: Pt. sitting up in chair upon arrival    Transfers Overall transfer level: Needs assistance Equipment used: Rolling walker (2 wheels) Transfers: Sit to/from Stand Sit to Stand: Supervision Stand pivot transfers: Supervision Step pivot transfers: Supervision             Balance                                           ADL either performed or assessed with clinical judgement   ADL                       Lower Body Dressing: Minimal assistance;With adaptive equipment   Toilet Transfer: Supervision/safety;Set up;BSC/3in1                  Extremity/Trunk Assessment              Vision Patient Visual Report: No change from baseline     Perception     Praxis      Cognition Arousal/Alertness: Awake/alert Behavior During Therapy: WFL for tasks assessed/performed Overall Cognitive Status: Within Functional Limits for tasks assessed                                            Exercises     Shoulder Instructions       General Comments      Pertinent Vitals/ Pain         No  denies pain  Home Living           Entrance Stairs-Number of Steps: steps to front, ramp to back                              Prior Functioning/Environment              Frequency  Min 2X/week        Progress Toward Goals  OT Goals(current goals can now be found in the care plan section)  Progress towards OT goals: Progressing toward goals  Acute Rehab OT Goals Patient Stated Goal: To go home OT Goal Formulation: With patient Time For Goal Achievement: 07/30/21 Potential to Achieve Goals: Good  Plan Discharge plan remains appropriate    Co-evaluation                 AM-PAC OT "6 Clicks" Daily Activity     Outcome Measure   Help from another person eating meals?: None Help from another person taking care of personal grooming?: A Little Help from another person toileting,  which includes using toliet, bedpan, or urinal?: A Little Help from another person bathing (including washing, rinsing, drying)?: A Little Help from another person to put on and taking off regular upper body clothing?: A Little Help from another person to put on and taking off regular lower body clothing?: A Little 6 Click Score: 19    End of Session    OT Visit Diagnosis: Unsteadiness on feet (R26.81);Muscle weakness (generalized) (M62.81)   Activity Tolerance Patient tolerated treatment well   Patient Left in chair;with call bell/phone within reach;with chair alarm set   Nurse Communication          Time: 1610-9604 OT Time Calculation (min): 26 min  Charges: OT General Charges $OT Visit: 1 Visit OT Treatments $Self Care/Home Management : 23-37 mins  Harrel Carina, MS, OTR/L   Harrel Carina 07/23/2021, 3:53 PM

## 2021-07-23 NOTE — Progress Notes (Signed)
Progress Note  Patient Name: Michele [name] Date of Encounter: 07/23/2021  [facility] Cardiologist: Dr. Rockey Situ  Subjective   Feels much better, minimal edema in her legs, just came back from walking.  Denies chest pain or shortness of breath.  Inpatient Medications    Scheduled Meds:  amiodarone  400 mg Oral BID   apixaban  5 mg Oral BID   insulin aspart  0-6 Units Subcutaneous TID WC   insulin detemir  15 Units Subcutaneous Q12H   levothyroxine  200 mcg Oral Q0600   losartan  12.5 mg Oral QPM   metoprolol succinate  12.5 mg Oral Daily   rosuvastatin  10 mg Oral Daily   sodium chloride flush  10-40 mL Intracatheter Q12H   torsemide  40 mg Oral BID   Continuous Infusions:  sodium chloride Stopped (07/18/21 1619)   PRN Meds: acetaminophen, docusate sodium, ondansetron (ZOFRAN) IV, polyethylene glycol, sodium chloride flush   Vital Signs    Vitals:   07/23/21 0045 07/23/21 0500 07/23/21 0521 07/23/21 0749  BP: (!) 76/66  104/72 94/72  Pulse: 72  66 67  Resp: 16  18 17   Temp: 98 F (36.7 C)  98.1 F (36.7 C) 98.4 F (36.9 C)  TempSrc:      SpO2: (!) 89%  97% 97%  Weight:  91.1 kg    Height:        Intake/Output Summary (Last 24 hours) at 07/23/2021 1101 Last data filed at 07/23/2021 0900 Gross per 24 hour  Intake 240 ml  Output --  Net 240 ml   Last 3 Weights 07/23/2021 07/22/2021 07/21/2021  Weight (lbs) 200 lb 13.4 oz 205 lb 7.5 oz 206 lb 12.7 oz  Weight (kg) 91.1 kg 93.2 kg 93.8 kg      Telemetry    Sinus rhythm- Personally Reviewed  ECG     - Personally Reviewed  Physical Exam   GEN: No acute distress.   Neck: No JVD Cardiac: RRR, no murmurs  Respiratory: Decreased breath sounds at bases GI: Soft, nontender, non-distended  MS: trace edema; No deformity. Neuro:  Nonfocal  Psych: Normal affect   Labs    High Sensitivity Troponin:   Recent Labs  Lab 07/13/21 1706 07/13/21 1925  TROPONINIHS 25* 25*     Chemistry Recent Labs  Lab  07/19/21 0513 07/20/21 0450 07/21/21 0337 07/22/21 0500 07/22/21 0542 07/23/21 0451  NA 134*   < > 137  --  137 139  K 3.6   < > 3.9  --  3.2* 3.7  CL 100   < > 99  --  98 99  CO2 23   < > 27  --  30 29  GLUCOSE 151*   < > 160*  --  52* 199*  BUN 58*   < > 62*  --  69* 59*  CREATININE 1.75*   < > 1.48*  --  1.51* 1.10*  CALCIUM 8.2*   < > 8.6*  --  8.8* 9.2  MG 1.7  --   --  1.8  --  2.0  PROT 6.4*  --   --   --   --   --   ALBUMIN 3.3*  --   --   --   --   --   AST 84*  --   --   --   --   --   ALT 119*  --   --   --   --   --  ALKPHOS 131*  --   --   --   --   --   BILITOT 0.9  --   --   --   --   --   GFRNONAA 32*   < > 39*  --  38* 56*  ANIONGAP 11   < > 11  --  9 11   < > = values in this interval not displayed.    Lipids No results for input(s): CHOL, TRIG, HDL, LABVLDL, LDLCALC, CHOLHDL in the last 168 hours.  Hematology Recent Labs  Lab 07/21/21 0337 07/22/21 0542 07/23/21 0451  WBC 15.4* 20.3* 12.8*  RBC 4.58 4.68 4.85  HGB 14.6 14.6 15.4*  HCT 43.9 44.4 46.6*  MCV 95.9 94.9 96.1  MCH 31.9 31.2 31.8  MCHC 33.3 32.9 33.0  RDW 15.6* 15.8* 15.5  PLT 113* 126* 117*   Thyroid  No results for input(s): TSH, FREET4 in the last 168 hours.   BNPNo results for input(s): BNP, PROBNP in the last 168 hours.  DDimer  Recent Labs  Lab 07/17/21 0249  DDIMER 3.07*     Radiology    No results found.  Cardiac Studies   TTE 07/14/2021 1. Left ventricular ejection fraction, by estimation, is <20%. The left  ventricle has severely decreased function. The left ventricle demonstrates  global hypokinesis , inferior and inferolateral wall motion best  preserved. The left ventricular internal  cavity size was moderately dilated. Left ventricular diastolic parameters  are indeterminate.   2. Right ventricular systolic function is moderately reduced. The right  ventricular size is normal. There is normal pulmonary artery systolic  pressure.   3. Left atrial size was  severely dilated.   4. Right atrial size was mildly dilated.   5. A small pericardial effusion is present.   6. The mitral valve is normal in structure. Mild to moderate mitral valve  regurgitation. No evidence of mitral stenosis.   7. The inferior vena cava is normal in size with greater than 50%  respiratory variability, suggesting right atrial pressure of 3 mmHg.  Patient Profile     66 y.o. female with history of CAD, diabetes, hyperlipidemia, nonischemic cardiomyopathy EF 20%, presenting with shortness of breath x1 week, diagnosed with A. fib RVR and acute CHF.  Assessment & Plan    Nonischemic cardiomyopathy, EF 20% -Denies shortness of breath, almost euvolemic. -Stop IV Lasix, start torsemide 40 mg twice daily. -Monitor ins and out, monitor creatinine. -Continue Toprol 12.5 mg daily, losartan 12.5 mg daily -Low blood pressures preventing titration of GDMT at this time. -Anticipate discharge in a day or 2.  Close follow-up as outpatient, consider ICD if EF stays less than 35%  2.  A. fib RVR -S/p DC cardioversion -Maintaining sinus rhythm.  amiodarone 400 mg twice daily x7 days -Reduce amiodarone 200 mg twice daily after 7 days. -Eliquis  3.  CAD, 70% mid LAD on recent cath -Crestor, Eliquis -Denies chest pain  Total encounter time more than 35 minutes  Greater than 50% was spent in counseling and coordination of care with the patient     Signed, Kate Sable, MD  07/23/2021, 11:01 AM

## 2021-07-24 DIAGNOSIS — I4891 Unspecified atrial fibrillation: Secondary | ICD-10-CM | POA: Diagnosis not present

## 2021-07-24 DIAGNOSIS — I5021 Acute systolic (congestive) heart failure: Secondary | ICD-10-CM | POA: Diagnosis not present

## 2021-07-24 DIAGNOSIS — I2510 Atherosclerotic heart disease of native coronary artery without angina pectoris: Secondary | ICD-10-CM | POA: Diagnosis not present

## 2021-07-24 LAB — BASIC METABOLIC PANEL
Anion gap: 12 (ref 5–15)
BUN: 63 mg/dL — ABNORMAL HIGH (ref 8–23)
CO2: 33 mmol/L — ABNORMAL HIGH (ref 22–32)
Calcium: 9 mg/dL (ref 8.9–10.3)
Chloride: 92 mmol/L — ABNORMAL LOW (ref 98–111)
Creatinine, Ser: 1.31 mg/dL — ABNORMAL HIGH (ref 0.44–1.00)
GFR, Estimated: 45 mL/min — ABNORMAL LOW (ref >60)
Glucose, Bld: 163 mg/dL — ABNORMAL HIGH (ref 70–99)
Potassium: 3.7 mmol/L (ref 3.5–5.1)
Sodium: 137 mmol/L (ref 135–145)

## 2021-07-24 LAB — GLUCOSE, CAPILLARY
Glucose-Capillary: 120 mg/dL — ABNORMAL HIGH (ref 70–99)
Glucose-Capillary: 122 mg/dL — ABNORMAL HIGH (ref 70–99)

## 2021-07-24 MED ORDER — LANTUS SOLOSTAR 100 UNIT/ML ~~LOC~~ SOPN: 15.0000 [IU] | PEN_INJECTOR | Freq: Two times a day (BID) | SUBCUTANEOUS | 0 refills | Status: DC

## 2021-07-24 MED ORDER — TORSEMIDE 40 MG PO TABS: 40.0000 mg | ORAL_TABLET | Freq: Two times a day (BID) | ORAL | 0 refills | Status: DC

## 2021-07-24 MED ORDER — APIXABAN 5 MG PO TABS: 5.0000 mg | ORAL_TABLET | Freq: Two times a day (BID) | ORAL | 0 refills | Status: DC

## 2021-07-24 MED ORDER — AMIODARONE HCL 200 MG PO TABS: 200.0000 mg | ORAL_TABLET | Freq: Two times a day (BID) | ORAL | 0 refills | Status: DC

## 2021-07-24 MED ORDER — LOSARTAN POTASSIUM 25 MG PO TABS: 12.5000 mg | ORAL_TABLET | Freq: Every evening | ORAL | 0 refills | Status: DC

## 2021-07-24 MED ORDER — AMIODARONE HCL 200 MG PO TABS
200.0000 mg | ORAL_TABLET | Freq: Two times a day (BID) | ORAL | Status: DC
  Administered 2021-07-24: 200 mg via ORAL

## 2021-07-24 MED ORDER — METOPROLOL SUCCINATE ER 25 MG PO TB24: 12.5000 mg | ORAL_TABLET | Freq: Every day | ORAL | 0 refills | Status: DC

## 2021-07-24 NOTE — Progress Notes (Signed)
°  Amiodarone Drug - Drug Interaction Consult Note  Recommendations: none  Amiodarone is metabolized by the cytochrome P450 system and therefore has the potential to cause many drug interactions. Amiodarone has an average plasma half-life of 50 days (range 20 to 100 days).   There is potential for drug interactions to occur several weeks or months after stopping treatment and the onset of drug interactions may be slow after initiating amiodarone.   [x]  Statins: Increased risk of myopathy. Simvastatin- restrict dose to 20mg  daily. Other statins: counsel patients to report any muscle pain or weakness immediately.  []  Anticoagulants: Amiodarone can increase anticoagulant effect. Consider warfarin dose reduction. Patients should be monitored closely and the dose of anticoagulant altered accordingly, remembering that amiodarone levels take several weeks to stabilize.  []  Antiepileptics: Amiodarone can increase plasma concentration of phenytoin, the dose should be reduced. Note that small changes in phenytoin dose can result in large changes in levels. Monitor patient and counsel on signs of toxicity.  [x]  Beta blockers: increased risk of bradycardia, AV block and myocardial depression. Sotalol - avoid concomitant use.  []   Calcium channel blockers (diltiazem and verapamil): increased risk of bradycardia, AV block and myocardial depression.  []   Cyclosporine: Amiodarone increases levels of cyclosporine. Reduced dose of cyclosporine is recommended.  []  Digoxin dose should be halved when amiodarone is started.  [x]  Diuretics: increased risk of cardiotoxicity if hypokalemia occurs.  []  Oral hypoglycemic agents (glyburide, glipizide, glimepiride): increased risk of hypoglycemia. Patient's glucose levels should be monitored closely when initiating amiodarone therapy.   []  Drugs that prolong the QT interval:  Torsades de pointes risk may be increased with concurrent use - avoid if possible.  Monitor  QTc, also keep magnesium/potassium WNL if concurrent therapy can't be avoided.  Antibiotics: e.g. fluoroquinolones, erythromycin.  Antiarrhythmics: e.g. quinidine, procainamide, disopyramide, sotalol.  Antipsychotics: e.g. phenothiazines, haloperidol.   Lithium, tricyclic antidepressants, and methadone.  Thank You,  Dallie Piles  07/24/2021 11:31 AM

## 2021-07-24 NOTE — TOC Transition Note (Addendum)
Transition of Care [facility]) - CM/SW Discharge Note   Patient Details  Name: Michele [name] MRN: [id number] Date of Birth: 03/05/1956  [facility] CM/SW Contact:  Alberteen Sam, LCSW Phone Number: 07/24/2021, 1:25 PM   Clinical Narrative:     Patient to discharge today home with home health services, CSW has informed Corene Cornea with [facility] that patient is to discharge today. They will service for [facility] PT , RN and OT.   No further discharge needs identified at this time.    Final next level of care: [place] Barriers to Discharge: No Barriers Identified   Patient Goals and CMS Choice Patient states their goals for this hospitalization and ongoing recovery are:: to go home CMS Medicare.gov Compare Post Acute Care list provided to:: Patient Choice offered to / list presented to : Patient  Discharge Placement                    Patient and family notified of of transfer: 07/24/21  Discharge Plan and Services                          HH Arranged: PT, OT [place] Agency: [place] ([facility]) Date [place]: 07/24/21 Time [place]: 104 Representative spoke with at [place]: [place] ([place]) Interventions     Readmission Risk Interventions No flowsheet data found.

## 2021-07-24 NOTE — Discharge Summary (Signed)
Physician Discharge Summary  Laiba Fuerte FXO:[id number] DOB: [birth year] DOA: 07/13/2021  PCP: Jerrol Banana., MD  Admit date: 07/13/2021 Discharge date: 07/24/2021  Admitted From: Home Disposition:  Home with home health   Recommendations for Outpatient Follow-up:  Follow up with PCP in 1 week Follow up with Dr. Rockey Situ, cardiology, in 1-2 weeks  Repeat BMP in 1 week  Discharge Condition: Stable CODE STATUS: Full  Diet recommendation: Heart healthy   Brief/Interim Summary: Abygail Galeno is a 66 y.o female with significant PMH of OSA on CPAP, hypothyroidism, diabetes mellitus, hypertension, CHF, DCIS, neutrophilia, hyperlipidemia, CAD, who presented to the ED with chief complaints of  shortness of breath x1 week. Chest x-ray was obtained showed cardiac enlargement with mild vascular congestion and mild left lower lobe atelectasis or infiltrate.  CTA angio chest showed incomplete opacification of segmental branches of the right lower lobe pulmonary artery, PE could not be excluded.  Cardiomegaly with scattered groundglass opacities and small bilateral effusion consistent with mild congestive heart failure.  She was started on broad-spectrum antibiotics.  She became hypotensive and required vasopressors and was admitted to ICU on 1/5.  She was also placed on BiPAP due to increased oxygen requirement.  Further work-up revealed CHF exacerbation with EF less than 20%.  She was found to be in cardiogenic shock and cardiology was managing patient on milrinone drip and IV Lasix.  She also had A. fib RVR and underwent DC cardioversion.  Underwent ischemic work-up which revealed 70% mid LAD stenosis.  She was transferred to [facility] 1/12.  Patient was diuresed with IV Lasix with good urine output.  She remained in normal sinus rhythm on amiodarone.  Subjective on day of discharge: Patient feeling well, no complaints of chest pain or shortness of breath.  Has been diuresing on torsemide.  Able to ambulate  around the room by herself, around the unit with family.  Per cardiology, patient may be discharged home with close outpatient follow-up.   Discharge Diagnoses:  Principal Problem:   Acute respiratory failure with hypoxia (HCC) Active Problems:   Atherosclerosis of native coronary artery of native heart with stable angina pectoris (HCC)   Diabetes mellitus ([place])   Atrial fibrillation (HCC)   Pressure injury of skin   Dilated cardiomyopathy (HCC)   Acute systolic heart failure (HCC)   Cardiogenic shock (HCC)   Coronary artery disease involving native heart   Acute hypoxemic respiratory failure -Required BiPAP on admission -Resolved and remains on room air   Cardiogenic shock, acute on chronic systolic heart failure Non-ischemic cardiomyopathy -Status post heart cath 1/6 -Now weaned off vasopressors, milrinone  -Continue torsemide, Toprol, losartan -Cardiology following  A. fib RVR -Status post DC cardioversion 1/10 -Continue amiodarone, eliquis -In normal sinus rhythm today   CAD -Continue Crestor   Diabetes mellitus type 2, uncontrolled with hyperglycemia -A1c 9.8 -Continue lantus   CKD stage IIIa -Baseline creatinine 1.5 in December 2022 -Remains stable   Hypothyroidism -Continue Synthroid   Chronic leukocytosis -States that she was previously seen by Dr. Rogue Bussing, oncology, for leukocytosis work up, which were unremarkable. Note reviewed from June 2021. At that time, suspected benign causes, inflammation. Myeloproliferative work up negative.  -Remains afebrile  In agreement with assessment of the pressure ulcer as below:  Pressure Injury 07/14/21 Perineum Left;Right Stage 2 -  Partial thickness loss of dermis presenting as a shallow open injury with a red, pink wound bed without slough. (Active)  07/14/21 0159  Location: Perineum  Location Orientation: Left;Right  Staging: Stage 2 -  Partial thickness loss of dermis presenting as a shallow open injury with a  red, pink wound bed without slough.  Wound Description (Comments):   Present on Admission: Yes     Pressure Injury 07/14/21 Sacrum Mid Stage 2 -  Partial thickness loss of dermis presenting as a shallow open injury with a red, pink wound bed without slough. (Active)  07/14/21 0201  Location: Sacrum  Location Orientation: Mid  Staging: Stage 2 -  Partial thickness loss of dermis presenting as a shallow open injury with a red, pink wound bed without slough.  Wound Description (Comments):   Present on Admission:        Discharge Instructions  Discharge Instructions     (HEART FAILURE PATIENTS) Call MD:  Anytime you have any of the following symptoms: 1) 3 pound weight gain in 24 hours or 5 pounds in 1 week 2) shortness of breath, with or without a dry hacking cough 3) swelling in the hands, feet or stomach 4) if you have to sleep on extra pillows at night in order to breathe.   Complete by: As directed    Call MD for:  difficulty breathing, headache or visual disturbances   Complete by: As directed    Call MD for:  extreme fatigue   Complete by: As directed    Call MD for:  persistant dizziness or light-headedness   Complete by: As directed    Call MD for:  persistant nausea and vomiting   Complete by: As directed    Call MD for:  severe uncontrolled pain   Complete by: As directed    Call MD for:  temperature >100.4   Complete by: As directed    Diet - low sodium heart healthy   Complete by: As directed    Discharge instructions   Complete by: As directed    You were cared for by a hospitalist during your hospital stay. If you have any questions about your discharge medications or the care you received while you were in the hospital after you are discharged, you can call the unit and ask to speak with the hospitalist on call if the hospitalist that took care of you is not available. Once you are discharged, your primary care physician will handle any further medical issues. Please  note that NO REFILLS for any discharge medications will be authorized once you are discharged, as it is imperative that you return to your primary care physician (or establish a relationship with a primary care physician if you do not have one) for your aftercare needs so that they can reassess your need for medications and monitor your lab values.   Increase activity slowly   Complete by: As directed    No wound care   Complete by: As directed       Allergies as of 07/24/2021   No Known Allergies      Medication List     STOP taking these medications    allopurinol 300 MG tablet Commonly known as: ZYLOPRIM   amLODipine 10 MG tablet Commonly known as: NORVASC   Bystolic 20 MG Tabs Generic drug: Nebivolol HCl   glimepiride 4 MG tablet Commonly known as: AMARYL       TAKE these medications    amiodarone 200 MG tablet Commonly known as: PACERONE Take 1 tablet (200 mg total) by mouth 2 (two) times daily.   apixaban 5 MG Tabs tablet Commonly known as: ELIQUIS Take 1 tablet (  5 mg total) by mouth 2 (two) times daily.   cetirizine 10 MG tablet Commonly known as: ZYRTEC Take 1 tablet (10 mg total) by mouth daily.   fluticasone 50 MCG/ACT nasal spray Commonly known as: FLONASE Place into the nose.   glucose blood test strip Commonly known as: ONE TOUCH ULTRA TEST Use as instructed   Gvoke HypoPen 1-Pack 1 MG/0.2ML Soaj Generic drug: Glucagon Inject 1 mg into the skin every 15 (fifteen) minutes as needed for up to 5 doses.   Insulin Pen Needle 32G X 4 MM Misc Inject twice daily, SQ. DX E11.9   Lantus SoloStar 100 UNIT/ML Solostar Pen Generic drug: insulin glargine Inject 15 Units into the skin 2 (two) times daily. What changed:  how much to take how to take this when to take this additional instructions   levothyroxine 200 MCG tablet Commonly known as: SYNTHROID TAKE 1 TABLET(200 MCG) BY MOUTH DAILY BEFORE BREAKFAST   losartan 25 MG tablet Commonly  known as: COZAAR Take 0.5 tablets (12.5 mg total) by mouth every evening. What changed:  medication strength how much to take when to take this   metoprolol succinate 25 MG 24 hr tablet Commonly known as: TOPROL-XL Take 0.5 tablets (12.5 mg total) by mouth daily. Start taking on: July 25, 2021   NON FORMULARY CPAP AT BEDTIME   ONE TOUCH ULTRA SYSTEM KIT w/Device Kit Check sugar once daily. DX E11.9   onetouch ultrasoft lancets Check sugar once daily DX E11.9   rosuvastatin 10 MG tablet Commonly known as: CRESTOR TAKE 1 TABLET BY MOUTH  DAILY   SYRINGE 3CC/25GX1" 25G X 1" 3 ML Misc 1 mL by Does not apply route every 30 (thirty) days.   Torsemide 40 MG Tabs Take 40 mg by mouth 2 (two) times daily.        Follow-up Information     Jerrol Banana., MD. Schedule an appointment as soon as possible for a visit in 1 week(s).   Specialty: Family Medicine Contact information: [address] [place] [address] [phone]         Minna Merritts, MD. Schedule an appointment as soon as possible for a visit in 1 week(s).   Specialty: Cardiology Contact information: [place] [address] [phone]                No Known Allergies   Procedures/Studies: DG Chest 2 View  Result Date: 07/13/2021 CLINICAL DATA:  Short of breath and weakness EXAM: CHEST - 2 VIEW COMPARISON:  03/01/2012 FINDINGS: Cardiac enlargement with mild vascular congestion. Negative for edema or effusion. Mild atelectasis or infiltrate in the left lung base. IMPRESSION: Cardiac enlargement with mild vascular congestion Mild left lower lobe atelectasis or infiltrate. Electronically Signed   By: Franchot Gallo M.D.   On: 07/13/2021 17:32   CT Angio Chest PE W and/or Wo Contrast  Result Date: 07/13/2021 CLINICAL DATA:  Short of breath, hypoxia EXAM: CT ANGIOGRAPHY CHEST WITH CONTRAST TECHNIQUE: Multidetector CT imaging of the chest was performed  using the standard protocol during bolus administration of intravenous contrast. Multiplanar CT image reconstructions and MIPs were obtained to evaluate the vascular anatomy. CONTRAST:  12mL OMNIPAQUE IOHEXOL 350 MG/ML SOLN COMPARISON:  07/13/2021 FINDINGS: Cardiovascular: This is a technically adequate evaluation of the pulmonary vasculature. There is some mixing artifact within the pulmonary vasculature. Within the lateral basilar segmental branches of the right lower lobe pulmonary artery, there is incomplete filling. It is  unclear whether this reflects a nonocclusive pulmonary embolus versus flow related phenomenon. Pulmonary embolus cannot be excluded in this distribution. No other filling defects are identified. The heart is enlarged without pericardial effusion. Normal caliber of the thoracic aorta. Evaluation of the aortic lumen is limited due to the timing of contrast bolus. Mediastinum/Nodes: No enlarged mediastinal, hilar, or axillary lymph nodes. Thyroid gland, trachea, and esophagus demonstrate no significant findings. Lungs/Pleura: There are small bilateral pleural effusions. Consolidation within the lower lobes likely reflects atelectasis. There is scattered ground-glass opacities within the upper lung zones, favor edema. No pneumothorax. Central airways are patent. Upper Abdomen: No acute abnormality. Musculoskeletal: No acute or destructive bony lesions. Reconstructed images demonstrate no additional findings. Review of the MIP images confirms the above findings. IMPRESSION: 1. Incomplete opacification of segmental branches of the right lower lobe pulmonary artery. Favor flow related phenomenon and mixing artifact over nonocclusive thrombus. However, PE cannot be excluded. 2. Cardiomegaly, with scattered ground-glass opacities and small bilateral effusions consistent with mild congestive heart failure. 3.  Aortic Atherosclerosis (ICD10-I70.0). Critical Value/emergent results were called by telephone  at the time of interpretation on 07/13/2021 at 10:24 pm to provider [facility] , who verbally acknowledged these results. Electronically Signed   By: Randa Ngo M.D.   On: 07/13/2021 22:24   [place] CHEST (PLEURAL EFFUSION)  Result Date: 07/14/2021 CLINICAL DATA:  Evaluate pleural fluid EXAM: CHEST ULTRASOUND COMPARISON:  None. FINDINGS: Limited sonographic exam of the bilateral chest was performed for fluid assessment. Images demonstrate small left and trace right pleural effusions. No safe percutaneous window is present on the left side. Fluid volume is insufficient on the right for safe image guided thoracentesis. No thoracentesis performed. IMPRESSION: There is a small left and trace right pleural effusion. No safe window/insufficient fluid volume for safe image guided thoracentesis. No thoracentesis performed. Electronically Signed   By: Albin Felling M.D.   On: 07/14/2021 12:54   CARDIAC CATHETERIZATION  Result Date: 07/15/2021   Mid LAD lesion is 70% stenosed.   2nd Diag lesion is 40% stenosed.   Prox RCA lesion is 40% stenosed. 1.  Significant one-vessel coronary artery disease with 70% stenosis in the mid LAD.  Sluggish TIMI II flow in all coronary arteries likely due to severely reduced cardiac output. 2.  Left ventricular angiography was not performed due to acute kidney injury.  EF was severely reduced by echo. 3.  Right heart catheterization showed moderately elevated filling pressures, mild pulmonary hypertension and severely reduced cardiac output.  Hemodynamics are consistent with cardiogenic shock with a cardiac output of 2.19 and cardiac index of 1.13. Recommendations: The patient has nonischemic cardiomyopathy as the stenosis in the LAD does not explain degree of decreased ejection fraction. She is volume overloaded with critically low cardiac output.  I am going to start the patient on milrinone drip and start IV diuresis.  I stopped metoprolol. Atrial fibrillation is complicating the  picture and for now we will use IV amiodarone for rate control with plans to proceed with TEE guided cardioversion early next week.   US Venous Img Lower Bilateral (DVT)  Result Date: 07/14/2021 CLINICAL DATA:  Bilateral lower extremity edema.  CHF. EXAM: BILATERAL LOWER EXTREMITY VENOUS DOPPLER ULTRASOUND TECHNIQUE: Gray-scale sonography with graded compression, as well as color Doppler and duplex ultrasound were performed to evaluate the lower extremity deep venous systems from the level of the common femoral vein and including the common femoral, femoral, profunda femoral, popliteal and calf veins including the posterior  tibial, peroneal and gastrocnemius veins when visible. The superficial great saphenous vein was also interrogated. Spectral Doppler was utilized to evaluate flow at rest and with distal augmentation maneuvers in the common femoral, femoral and popliteal veins. COMPARISON:  Chest XR, 07/13/2021. FINDINGS: RIGHT LOWER EXTREMITY VENOUS Normal compressibility of the RIGHT common femoral, superficial femoral, and popliteal veins, as well as the visualized calf veins. Visualized portions of profunda femoral vein and great saphenous vein unremarkable. No filling defects to suggest DVT on grayscale or color Doppler imaging. Doppler waveforms show normal direction of venous flow, normal respiratory plasticity and response to augmentation. OTHER No evidence of superficial thrombophlebitis or abnormal fluid collection. Limitations: none LEFT LOWER EXTREMITY VENOUS Normal compressibility of the LEFT common femoral, superficial femoral, and popliteal veins, as well as the visualized calf veins. Visualized portions of profunda femoral vein and great saphenous vein unremarkable. No filling defects to suggest DVT on grayscale or color Doppler imaging. Doppler waveforms show normal direction of venous flow, normal respiratory plasticity and response to augmentation. OTHER No evidence of superficial  thrombophlebitis or abnormal fluid collection. Limitations: none IMPRESSION: No evidence of femoropopliteal DVT within either lower extremity. Michaelle Birks, MD [facility] Radiology Electronically Signed   By: Michaelle Birks M.D.   On: 07/14/2021 10:08   DG Chest Port 1 View  Result Date: 07/15/2021 CLINICAL DATA:  status post PICC line placement EXAM: PORTABLE CHEST 1 VIEW COMPARISON:  Chest x-ray 07/15/2021, CT chest 07/13/2021 FINDINGS: Left PICC with tip overlying the right atrium. Enlarged cardiac silhouette. The heart and mediastinal contours are unchanged. Prominent hilar vasculature. No focal consolidation. Increased interstitial markings. Persistent trace to small volume left and trace right pleural effusions. No pneumothorax. No acute osseous abnormality. IMPRESSION: 1. Left PICC with tip overlying the right atrium. 2. Likely pulmonary edema with persistent trace to small volume left and trace right pleural effusions. Superimposed infection/inflammation not excluded. Followup PA and lateral chest X-ray is recommended in 3-4 weeks following therapy to ensure resolution and exclude underlying malignancy. Electronically Signed   By: Iven Finn M.D.   On: 07/15/2021 16:57   DG Chest Port 1 View  Result Date: 07/15/2021 CLINICAL DATA:  Dyspnea. EXAM: PORTABLE CHEST 1 VIEW COMPARISON:  July 13, 2021. FINDINGS: Stable cardiomegaly. Increased right basilar opacity is noted concerning for worsening edema or pneumonia. Stable left basilar opacity is noted as well. Bony thorax is unremarkable. IMPRESSION: Stable left basilar opacity is noted. Increased right basilar opacity is noted concerning for worsening edema or pneumonia. Electronically Signed   By: Marijo Conception M.D.   On: 07/15/2021 13:18   ECHOCARDIOGRAM COMPLETE  Result Date: 07/14/2021    ECHOCARDIOGRAM REPORT   Patient Name:   Michele [name] Date of Exam: 07/14/2021 Medical Rec #:  [id number]       Height:       62.0 in Accession #:    [id number]     Weight:       205.0 lb Date of Birth:  1955/12/22       BSA:          1.932 m Patient Age:    13 years       BP:           101/62 mmHg Patient Gender: F              HR:           127 bpm. Exam Location:  [facility] Procedure: 2D Echo,  Cardiac Doppler and Color Doppler Indications:     CHF-acute systolic 81.27                  CHF-acute diastolic N17.00                  Atrial Fibrillation I48.91  History:         Patient has no prior history of Echocardiogram examinations.                  CHF and Ischemic cardiomyopathy, CAD; Risk                  Factors:Hypertension.  Sonographer:     Sherrie Sport Referring Phys:  [place] Diagnosing Phys: Ida Rogue MD IMPRESSIONS  1. Left ventricular ejection fraction, by estimation, is <20%. The left ventricle has severely decreased function. The left ventricle demonstrates global hypokinesis , inferior and inferolateral wall motion best preserved. The left ventricular internal cavity size was moderately dilated. Left ventricular diastolic parameters are indeterminate.  2. Right ventricular systolic function is moderately reduced. The right ventricular size is normal. There is normal pulmonary artery systolic pressure.  3. Left atrial size was severely dilated.  4. Right atrial size was mildly dilated.  5. A small pericardial effusion is present.  6. The mitral valve is normal in structure. Mild to moderate mitral valve regurgitation. No evidence of mitral stenosis.  7. The inferior vena cava is normal in size with greater than 50% respiratory variability, suggesting right atrial pressure of 3 mmHg. FINDINGS  Left Ventricle: Left ventricular ejection fraction, by estimation, is <20%. The left ventricle has severely decreased function. The left ventricle demonstrates global hypokinesis. The left ventricular internal cavity size was moderately dilated. There is no left ventricular hypertrophy. Left  ventricular diastolic parameters are indeterminate. Right Ventricle: The right ventricular size is normal. No increase in right ventricular wall thickness. Right ventricular systolic function is moderately reduced. There is normal pulmonary artery systolic pressure. The tricuspid regurgitant velocity is 2.32 m/s, and with an assumed right atrial pressure of 5 mmHg, the estimated right ventricular systolic pressure is 17.4 mmHg. Left Atrium: Left atrial size was severely dilated. Right Atrium: Right atrial size was mildly dilated. Pericardium: A small pericardial effusion is present. Mitral Valve: The mitral valve is normal in structure. Mild to moderate mitral valve regurgitation. No evidence of mitral valve stenosis. Tricuspid Valve: The tricuspid valve is normal in structure. Tricuspid valve regurgitation is mild . No evidence of tricuspid stenosis. Aortic Valve: The aortic valve was not well visualized. Aortic valve regurgitation is not visualized. No aortic stenosis is present. Aortic valve mean gradient measures 0.0 mmHg. Aortic valve peak gradient measures 0.9 mmHg. Aortic valve area, by VTI measures 2.95 cm. Pulmonic Valve: The pulmonic valve was normal in structure. Pulmonic valve regurgitation is not visualized. No evidence of pulmonic stenosis. Aorta: The aortic root is normal in size and structure. Venous: The inferior vena cava is normal in size with greater than 50% respiratory variability, suggesting right atrial pressure of 3 mmHg. IAS/Shunts: No atrial level shunt detected by color flow Doppler.  LEFT VENTRICLE PLAX 2D LVIDd:         5.20 cm LVIDs:         5.10 cm LV PW:         0.90 cm LV IVS:        0.85 cm LVOT diam:     2.00 cm LV SV:  15 LV SV Index:   8 LVOT Area:     3.14 cm  LV Volumes (MOD) LV vol d, MOD A2C: 97.0 ml LV vol d, MOD A4C: 88.1 ml LV vol s, MOD A2C: 94.4 ml LV vol s, MOD A4C: 77.0 ml LV SV MOD A2C:     2.6 ml LV SV MOD A4C:     88.1 ml LV SV MOD BP:      7.9 ml RIGHT  VENTRICLE RV Basal diam:  2.60 cm RV S prime:     7.94 cm/s TAPSE (M-mode): 3.7 cm LEFT ATRIUM              Index        RIGHT ATRIUM           Index LA diam:        4.90 cm  2.54 cm/m   RA Area:     24.90 cm LA Vol (A2C):   118.0 ml 61.08 ml/m  RA Volume:   74.70 ml  38.67 ml/m LA Vol (A4C):   106.0 ml 54.87 ml/m LA Biplane Vol: 115.0 ml 59.52 ml/m  AORTIC VALVE                    PULMONIC VALVE AV Area (Vmax):    2.21 cm     PV Vmax:        0.31 m/s AV Area (Vmean):   2.48 cm     PV Vmean:       19.400 cm/s AV Area (VTI):     2.95 cm     PV VTI:         0.041 m AV Vmax:           47.30 cm/s   PV Peak grad:   0.4 mmHg AV Vmean:          31.500 cm/s  PV Mean grad:   0.0 mmHg AV VTI:            0.050 m      RVOT Peak grad: 1 mmHg AV Peak Grad:      0.9 mmHg AV Mean Grad:      0.0 mmHg LVOT Vmax:         33.30 cm/s LVOT Vmean:        24.900 cm/s LVOT VTI:          0.047 m LVOT/AV VTI ratio: 0.94  AORTA Ao Root diam: 2.90 cm MITRAL VALVE                TRICUSPID VALVE MV Area (PHT): 6.48 cm     TR Peak grad:   21.5 mmHg MV Decel Time: 117 msec     TR Vmax:        232.00 cm/s MV E velocity: 122.00 cm/s                             SHUNTS                             Systemic VTI:  0.05 m                             Systemic Diam: 2.00 cm  Pulmonic VTI:  0.091 m Ida Rogue MD Electronically signed by Ida Rogue MD Signature Date/Time: 07/14/2021/12:44:22 PM    Final    ECHO TEE  Result Date: 07/21/2021    TRANSESOPHOGEAL ECHO REPORT   Patient Name:   LILAH MIJANGOS Date of Exam: 07/19/2021 Medical Rec #:  [id number]      Height:       62.0 in Accession #:    [id number]     Weight:       216.0 lb Date of Birth:  07/27/1955       BSA:          1.975 m Patient Age:    66 years       BP:           92/54 mmHg Patient Gender: F              HR:           69 bpm. Exam Location:  [facility] Procedure: Transesophageal Echo, Cardiac Doppler, Color Doppler and [place]            Contrast Bubble  Study Indications:     Atrial Fibrillation I48.91  History:         Patient has prior history of Echocardiogram examinations, most                  recent 07/14/2021. CHF, CAD; Risk Factors:Hypertension.  Sonographer:     Sherrie Sport Referring Phys:  [id number] [place] Diagnosing Phys: Ida Rogue MD PROCEDURE: After discussion of the risks and benefits of a TEE, an informed consent was obtained from the patient. The transesophogeal probe was passed without difficulty through the esophogus of the patient. Sedation performed by different physician. The patient's vital signs; including heart rate, blood pressure, and oxygen saturation; remained stable throughout the procedure. The patient developed no complications during the procedure. A successful direct current cardioversion was performed at 150 joules with 1 attempt. IMPRESSIONS  1. Left ventricular ejection fraction, by estimation, is <20%. The left ventricle has severely decreased function. The left ventricle has no regional wall motion abnormalities. The left ventricular internal cavity size was moderately dilated.  2. Right ventricular systolic function is severely reduced. The right ventricular size is normal. There is normal pulmonary artery systolic pressure. The estimated right ventricular systolic pressure is 22.4 mmHg.  3. Left atrial size was severely dilated. No left atrial/left atrial appendage thrombus was detected.  4. The mitral valve is normal in structure. Mild to moderate mitral valve regurgitation. No evidence of mitral stenosis.  5. Tricuspid valve regurgitation is mild to moderate.  6. The aortic valve is normal in structure. Aortic valve regurgitation is not visualized. No aortic stenosis is present.  7. The inferior vena cava is normal in size with greater than 50% respiratory variability, suggesting right atrial pressure of 3 mmHg.  8. Agitated saline contrast bubble study was negative, with no evidence of any interatrial shunt.  Conclusion(s)/Recommendation(s): Normal biventricular function without evidence of hemodynamically significant valvular heart disease. FINDINGS  Left Ventricle: Left ventricular ejection fraction, by estimation, is <20%. The left ventricle has severely decreased function. The left ventricle has no regional wall motion abnormalities. The left ventricular internal cavity size was moderately dilated. There is no left ventricular hypertrophy. Right Ventricle: The right ventricular size is normal. No increase in right ventricular wall thickness. Right ventricular systolic function is severely reduced. There is normal pulmonary artery systolic pressure. The tricuspid regurgitant velocity is 2.37 m/s,  and with an assumed right atrial pressure of 10 mmHg, the estimated right ventricular systolic pressure is 43.1 mmHg. Left Atrium: Left atrial size was severely dilated. No left atrial/left atrial appendage thrombus was detected. Right Atrium: Right atrial size was normal in size. Pericardium: There is no evidence of pericardial effusion. Mitral Valve: The mitral valve is normal in structure. Mild to moderate mitral valve regurgitation. No evidence of mitral valve stenosis. Tricuspid Valve: The tricuspid valve is normal in structure. Tricuspid valve regurgitation is mild to moderate. No evidence of tricuspid stenosis. Aortic Valve: The aortic valve is normal in structure. Aortic valve regurgitation is not visualized. No aortic stenosis is present. Pulmonic Valve: The pulmonic valve was normal in structure. Pulmonic valve regurgitation is not visualized. No evidence of pulmonic stenosis. Aorta: The aortic root is normal in size and structure. Venous: The inferior vena cava is normal in size with greater than 50% respiratory variability, suggesting right atrial pressure of 3 mmHg. IAS/Shunts: No atrial level shunt detected by color flow Doppler. Agitated saline contrast was given intravenously to evaluate for intracardiac  shunting. Agitated saline contrast bubble study was negative, with no evidence of any interatrial shunt.  TRICUSPID VALVE TR Peak grad:   22.5 mmHg TR Vmax:        237.00 cm/s Ida Rogue MD Electronically signed by Ida Rogue MD Signature Date/Time: 07/21/2021/6:46:52 PM    Final    [place] EKG Site Rite  Result Date: 07/15/2021 If Site Rite image not attached, placement could not be confirmed due to current cardiac rhythm.      Discharge Exam: Vitals:   07/24/21 0608 07/24/21 0807  BP: 91/68 107/67  Pulse: 66 (!) 51  Resp: 18 19  Temp: 98.4 F (36.9 C) 98.2 F (36.8 C)  SpO2: 99% (!) 89%    General: Pt is alert, awake, not in acute distress Cardiovascular: RRR, S1/S2 +, trace edema Respiratory: CTA bilaterally, no wheezing, no rhonchi, no respiratory distress, no conversational dyspnea, on room air  Abdominal: Soft, NT, ND, bowel sounds + Extremities: trace edema, no cyanosis Psych: Normal mood and affect, stable judgement and insight     The results of significant diagnostics from this hospitalization (including imaging, microbiology, ancillary and laboratory) are listed below for reference.     Microbiology: No results found for this or any previous visit (from the past 240 hour(s)).   Labs: BNP (last 3 results) Recent Labs    07/13/21 1706  BNP 540.0*   Basic Metabolic Panel: Recent Labs  Lab 07/18/21 0522 07/19/21 0513 07/20/21 0450 07/21/21 0337 07/22/21 0500 07/22/21 0542 07/23/21 0451 07/24/21 0550  NA 133* 134* 136 137  --  137 139 137  K 3.6 3.6 3.3* 3.9  --  3.2* 3.7 3.7  CL 99 100 101 99  --  98 99 92*  CO2 _0 --  30 29 33*  GLUCOSE 146* 151* 220* 160*  --  52* 199* 163*  BUN 57* 58* 57* 62*  --  69* 59* 63*  CREATININE 1.66* 1.75* 1.70* 1.48*  --  1.51* 1.10* 1.31*  CALCIUM 7.7* 8.2* 8.0* 8.6*  --  8.8* 9.2 9.0  MG  --  1.7  --   --  1.8  --  2.0  --   PHOS 2.8 2.5  --   --   --   --   --   --    Liver Function Tests: Recent  Labs  Lab 07/19/21 0513  AST 84*  ALT 119*  ALKPHOS 131*  BILITOT 0.9  PROT 6.4*  ALBUMIN 3.3*   No results for input(s): LIPASE, AMYLASE in the last 168 hours. No results for input(s): AMMONIA in the last 168 hours. CBC: Recent Labs  Lab 07/19/21 0513 07/20/21 0834 07/21/21 0337 07/22/21 0542 07/23/21 0451  WBC 14.7* 11.2* 15.4* 20.3* 12.8*  HGB 14.2 14.4 14.6 14.6 15.4*  HCT 42.7 42.5 43.9 44.4 46.6*  MCV 94.1 94.7 95.9 94.9 96.1  PLT 167 111* 113* 126* 117*   Cardiac Enzymes: No results for input(s): CKTOTAL, CKMB, CKMBINDEX, TROPONINI in the last 168 hours. BNP: Invalid input(s): POCBNP CBG: Recent Labs  Lab 07/23/21 0748 07/23/21 1222 07/23/21 1608 07/23/21 2109 07/24/21 0805  GLUCAP 165* 215* 218* 297* 120*   D-Dimer No results for input(s): DDIMER in the last 72 hours. Hgb A1c No results for input(s): HGBA1C in the last 72 hours. Lipid Profile No results for input(s): CHOL, HDL, LDLCALC, TRIG, CHOLHDL, LDLDIRECT in the last 72 hours. Thyroid function studies No results for input(s): TSH, T4TOTAL, T3FREE, THYROIDAB in the last 72 hours.  Invalid input(s): FREET3 Anemia work up No results for input(s): VITAMINB12, FOLATE, FERRITIN, TIBC, IRON, RETICCTPCT in the last 72 hours. Urinalysis No results found for: COLORURINE, APPEARANCEUR, LABSPEC, [place], GLUCOSEU, HGBUR, BILIRUBINUR, KETONESUR, PROTEINUR, UROBILINOGEN, NITRITE, LEUKOCYTESUR Sepsis Labs Invalid input(s): PROCALCITONIN,  WBC,  LACTICIDVEN Microbiology No results found for this or any previous visit (from the past 240 hour(s)).   Patient was seen and examined on the day of discharge and was found to be in stable condition. Time coordinating discharge: 25 minutes including assessment and coordination of care, as well as examination of the patient.   SIGNED:  Dessa Phi, DO [facility] 07/24/2021, 12:51 PM

## 2021-07-24 NOTE — Progress Notes (Signed)
Progress Note  Patient Name: Michele [name] Date of Encounter: 07/24/2021  [facility] Cardiologist: Dr. Rockey Situ  Subjective   No acute events overnight, denies chest pain or shortness of breath.  Inpatient Medications    Scheduled Meds:  amiodarone  200 mg Oral BID   apixaban  5 mg Oral BID   insulin aspart  0-6 Units Subcutaneous TID WC   insulin detemir  15 Units Subcutaneous Q12H   levothyroxine  200 mcg Oral Q0600   losartan  12.5 mg Oral QPM   metoprolol succinate  12.5 mg Oral Daily   rosuvastatin  10 mg Oral Daily   sodium chloride flush  10-40 mL Intracatheter Q12H   torsemide  40 mg Oral BID   Continuous Infusions:  sodium chloride Stopped (07/18/21 1619)   PRN Meds: acetaminophen, docusate sodium, ondansetron (ZOFRAN) IV, polyethylene glycol, sodium chloride flush   Vital Signs    Vitals:   07/23/21 2146 07/24/21 0058 07/24/21 0608 07/24/21 0807  BP: 100/65 96/65 91/68  107/67  Pulse: 69 70 66 (!) 51  Resp:  17 18 19   Temp: 98.2 F (36.8 C) 98.1 F (36.7 C) 98.4 F (36.9 C) 98.2 F (36.8 C)  TempSrc: Oral  Oral   SpO2: 100% 100% 99% (!) 89%  Weight:   87.6 kg   Height:        Intake/Output Summary (Last 24 hours) at 07/24/2021 1234 Last data filed at 07/24/2021 0950 Gross per 24 hour  Intake 720 ml  Output 950 ml  Net -230 ml   Last 3 Weights 07/24/2021 07/23/2021 07/22/2021  Weight (lbs) 193 lb 3.2 oz 200 lb 13.4 oz 205 lb 7.5 oz  Weight (kg) 87.635 kg 91.1 kg 93.2 kg      Telemetry    Sinus rhythm- Personally Reviewed  ECG     - Personally Reviewed  Physical Exam   GEN: No acute distress.   Neck: No JVD Cardiac: RRR, no murmurs  Respiratory: Decreased breath sounds at bases GI: Soft, nontender, non-distended  MS: trace edema; No deformity. Neuro:  Nonfocal  Psych: Normal affect   Labs    High Sensitivity Troponin:   Recent Labs  Lab 07/13/21 1706 07/13/21 1925  TROPONINIHS 25* 25*     Chemistry Recent Labs  Lab  07/19/21 0513 07/20/21 0450 07/22/21 0500 07/22/21 0542 07/23/21 0451 07/24/21 0550  NA 134*   < >  --  137 139 137  K 3.6   < >  --  3.2* 3.7 3.7  CL 100   < >  --  98 99 92*  CO2 23   < >  --  30 29 33*  GLUCOSE 151*   < >  --  52* 199* 163*  BUN 58*   < >  --  69* 59* 63*  CREATININE 1.75*   < >  --  1.51* 1.10* 1.31*  CALCIUM 8.2*   < >  --  8.8* 9.2 9.0  MG 1.7  --  1.8  --  2.0  --   PROT 6.4*  --   --   --   --   --   ALBUMIN 3.3*  --   --   --   --   --   AST 84*  --   --   --   --   --   ALT 119*  --   --   --   --   --   ALKPHOS 131*  --   --   --   --   --  BILITOT 0.9  --   --   --   --   --   GFRNONAA 32*   < >  --  38* 56* 45*  ANIONGAP 11   < >  --  9 11 12    < > = values in this interval not displayed.    Lipids No results for input(s): CHOL, TRIG, HDL, LABVLDL, LDLCALC, CHOLHDL in the last 168 hours.  Hematology Recent Labs  Lab 07/21/21 0337 07/22/21 0542 07/23/21 0451  WBC 15.4* 20.3* 12.8*  RBC 4.58 4.68 4.85  HGB 14.6 14.6 15.4*  HCT 43.9 44.4 46.6*  MCV 95.9 94.9 96.1  MCH 31.9 31.2 31.8  MCHC 33.3 32.9 33.0  RDW 15.6* 15.8* 15.5  PLT 113* 126* 117*   Thyroid  No results for input(s): TSH, FREET4 in the last 168 hours.   BNPNo results for input(s): BNP, PROBNP in the last 168 hours.  DDimer  No results for input(s): DDIMER in the last 168 hours.    Radiology    No results found.  Cardiac Studies   TTE 07/14/2021 1. Left ventricular ejection fraction, by estimation, is <20%. The left  ventricle has severely decreased function. The left ventricle demonstrates  global hypokinesis , inferior and inferolateral wall motion best  preserved. The left ventricular internal  cavity size was moderately dilated. Left ventricular diastolic parameters  are indeterminate.   2. Right ventricular systolic function is moderately reduced. The right  ventricular size is normal. There is normal pulmonary artery systolic  pressure.   3. Left atrial  size was severely dilated.   4. Right atrial size was mildly dilated.   5. A small pericardial effusion is present.   6. The mitral valve is normal in structure. Mild to moderate mitral valve  regurgitation. No evidence of mitral stenosis.   7. The inferior vena cava is normal in size with greater than 50%  respiratory variability, suggesting right atrial pressure of 3 mmHg.  Patient Profile     66 y.o. female with history of CAD, diabetes, hyperlipidemia, nonischemic cardiomyopathy EF 20%, presenting with shortness of breath x1 week, diagnosed with A. fib RVR and acute CHF.  Assessment & Plan    Nonischemic cardiomyopathy, EF 20% -Denies shortness of breath, almost euvolemic. -Continue torsemide 40 mg twice daily -Continue Toprol 12.5 mg daily, losartan 12.5 mg daily -Low blood pressures preventing titration of GDMT at this time. -Close follow-up as outpatient hopefully next week, check BMP.  2.  A. fib RVR -S/p DC cardioversion -Maintaining sinus rhythm.  Continue amiodarone 200 mg twice daily -Consider reducing amiodarone to 200 daily at follow-up visit -Eliquis  3.  CAD, 70% mid LAD on recent cath -Crestor, Eliquis -Denies chest pain  Patient appears euvolemic, BP is stable.  Patient can be discharged from a cardiac perspective on current cardiac medications as prescribed in house.  Close follow-up as outpatient recommended.  We will notify office to schedule patient for follow-up appointment within the next 1 to 2 weeks.  Total encounter time more than 35 minutes  Greater than 50% was spent in counseling and coordination of care with the patient     Signed, Kate Sable, MD  07/24/2021, 12:34 PM

## 2021-07-25 ENCOUNTER — Telehealth: Payer: Self-pay

## 2021-07-25 ENCOUNTER — Telehealth: Payer: Self-pay | Admitting: Cardiovascular Disease

## 2021-07-25 NOTE — Telephone Encounter (Signed)
Transition Care Management Follow-up Telephone Call Date of discharge and from where:TCM [facility] 07-24-21 Dx: acute respiratory failure with hypoxia  How have you been since you were released from the hospital? Feeling dehydrated from the medications  Any questions or concerns? Yes- pt is concerned the torsemide 80mg  is too much- was up all night urinating and now is shaking and feeling dehydrated   Items Reviewed: Did the pt receive and understand the discharge instructions provided? Yes  Medications obtained and verified? Yes  Other? No  Any new allergies since your discharge? No  Dietary orders reviewed? Yes Do you have support at home? Yes   Home Care and Equipment/Supplies: Were home health services ordered? Yes PT If so, what is the name of the agency? Name unknown Has the agency set up a time to come to the patient's home? yes Were any new equipment or medical supplies ordered?  No What is the name of the medical supply agency?na Were you able to get the supplies/equipment? not applicable Do you have any questions related to the use of the equipment or supplies? No  Functional Questionnaire: (I = Independent and D = Dependent) ADLs: D  Bathing/Dressing- D  Meal Prep- D  Eating- I  Maintaining continence- I  Transferring/Ambulation- I  Managing Meds- I  Follow up appointments reviewed:  PCP Hospital f/u appt confirmed? Yes  [facility]  Scheduled to see Dr Jackelyn Hoehn on 07-26-21 @ noon. Are transportation arrangements needed? no If their condition worsens, is the pt aware to call PCP or go to the Emergency Dept.? Yes Was the patient provided with contact information for the PCP's office or ED? Yes Was to pt encouraged to call back with questions or concerns? Yes

## 2021-07-25 NOTE — Telephone Encounter (Signed)
Was able to return call to Michele [name] to f/u on torsemide, advised that torsemide is a  loop diuretic (water pill) to treat fluid overload due to heart conditions where it can reduce extra fluid in the body and help with high blood pressure.  Advised the water pill can cause increase in frequency with urination as it is pushing excess fluids from her body. Should take Torsemide 40 mg BID, take first dose soon as wake up first thing in the am, then take second dose no later then 3 pm to see if that helps with the frequent trips in middle of the night to use the bathroom. Michele [name] verbalized understanding.  F/u appt 1/27 at 10:55 am with Ignacia Bayley, NP. Otherwise all questions were address and no additional concerns at this time. Michele [name] thankful for the return call and advice. Agreeable to plan, will call back for anything further.

## 2021-07-25 NOTE — Telephone Encounter (Signed)
Wouldn't this have to be discussed at office visit once patient is evaluated by you? Or are you okay with approving orders before visit? Please review ED note and advise. KW

## 2021-07-25 NOTE — Telephone Encounter (Signed)
Copied from [place] [phone]. Topic: General - Inquiry >> Jul 25, 2021 12:04 PM Oneta Rack wrote: Requesting PT orders prior to Hospital follow up appointment scheduled for 07/29/2021. Patient is needing assistance with climbing steps and walking. Caller would like a follow up call.

## 2021-07-25 NOTE — Progress Notes (Signed)
Patient ID: Michele [name], female    DOB: 13-Mar-1956, 66 y.o.   MRN: [id number]  HPI  Michele [name] is a 66 y/o female with a history of atrial fibrillation, breast cancer, CAD, DM, hyperlipidemia, HTN, thyroid disease, GERD, sleep apnea and chronic heart failure.   TEE report from 07/19/21 reviewed and showed an EF of <20% along with severe LAE, mild/moderate MR/TR and PA pressure of 32.5 mmHg. No aortic stenosis.   RHC/LHC done 07/15/21 and showed:   Mid LAD lesion is 70% stenosed.   2nd Diag lesion is 40% stenosed.   Prox RCA lesion is 40% stenosed.  1.  Significant one-vessel coronary artery disease with 70% stenosis in the mid LAD.  Sluggish TIMI II flow in all coronary arteries likely due to severely reduced cardiac output. 2.  Left ventricular angiography was not performed due to acute kidney injury.  EF was severely reduced by echo. 3.  Right heart catheterization showed moderately elevated filling pressures, mild pulmonary hypertension and severely reduced cardiac output.  Hemodynamics are consistent with cardiogenic shock with a cardiac output of 2.19 and cardiac index of 1.13.  Recommendations: The patient has nonischemic cardiomyopathy as the stenosis in the LAD does not explain degree of decreased ejection fraction. She is volume overloaded with critically low cardiac output.  I am going to start the patient on milrinone drip and start IV diuresis.  I stopped metoprolol. Atrial fibrillation is complicating the picture and for now we will use IV amiodarone for rate control with plans to proceed with TEE guided cardioversion early next week.  Admitted 07/13/21 due to shortness of breath for the last week. CTA angio chest showed incomplete opacification of segmental branches of the right lower lobe pulmonary artery, PE could not be excluded. Antibiotics started. She became hypotensive and was transferred to ICU needing vasopressors. Placed on bipap due to increased oxygen requirement.  Cardiology / EP consults obtained. Found to be in cardiogenic shock needing milirone drip and IV lasix. Was cardioverted due to atrial fibrillation. Transitioned to oral medications. PT/OT evaluations done. Discharged after 11 days.   She presents today for her initial visit with a chief complaint of minimal fatigue upon moderate exertion. She says that this has been present for several weeks although is improving day by day. She has associated pedal edema, leg weakness and easy bruising along with this. She denies any difficulty sleeping, dizziness, abdominal distention, palpitations, chest pain, shortness of breath, cough or weight gain.   Yesterday she felt like she was getting dehydrated because she felt dizzy and not well. She made sure to hydrate well and didn't take her second dose of diuretic . Feels better today  Past Medical History:  Diagnosis Date   Abscess of breast, right 08/19/2012   Arrhythmia    atrial fibrillation   Breast cancer ([facility]) 07/13/2010   1.5 cm,intermediate grade DCIS, nuclear grade 2, ER 90%, PR 90% treated with wide excision, reexcision to negative margins and MammoSite partial breast radiation.   CHF (congestive heart failure) ([place])    Coronary artery disease 2009   DCIS (ductal carcinoma in situ) of breast, right 2012   Diabetes mellitus (HCC)    Type II   GERD (gastroesophageal reflux disease)    Hyperlipidemia    Hypertension    Ischemic cardiomyopathy    Obesity, unspecified    Personal history of radiation therapy    Sleep apnea    Thyroid disease    hypothyroidism   Umbilical hernia without  mention of obstruction or gangrene    Past Surgical History:  Procedure Laterality Date   BREAST BIOPSY Right 2007   right bx neg   BREAST BIOPSY Right 08/10/2016   fat necrosis/ done in Dr. Dwyane Luo office   BREAST EXCISIONAL BIOPSY Right 2011   DCIS mammosite lumpectomy   BREAST LUMPECTOMY Right Jan 2012   Wide excision   BREAST MASS EXCISION Right  2011   December   CARDIAC CATHETERIZATION  01/08/2008   CHOLECYSTECTOMY  2013   [facility]   HERNIA REPAIR  2013   epigastric   INCISION AND DRAINAGE BREAST ABSCESS Right 08/19/2012   RIGHT/LEFT HEART CATH AND CORONARY ANGIOGRAPHY N/A 07/15/2021   Procedure: RIGHT/LEFT HEART CATH AND CORONARY ANGIOGRAPHY;  Surgeon: [name], MD;  Location: [facility];  Service: Cardiovascular;  Laterality: N/A;   TEE WITHOUT CARDIOVERSION N/A 07/19/2021   Procedure: TRANSESOPHAGEAL ECHOCARDIOGRAM (TEE);  Surgeon: Minna Merritts, MD;  Location: [facility];  Service: Cardiovascular;  Laterality: N/A;   TONSILLECTOMY AND ADENOIDECTOMY     age 71 yrs   Family History  Problem Relation Age of Onset   Heart failure Mother    Diabetes Mother    Congestive Heart Failure Mother    CVA Mother    Heart attack Father    Arthritis Father    Diabetes Sister    Hypertension Sister    Lung cancer Sister    Hypertension Brother    Hypertension Brother    Hemochromatosis Brother    Stroke Maternal Grandmother    Heart attack Maternal Grandfather    Heart attack Paternal Grandmother    Breast cancer Neg Hx    Social History   Tobacco Use   Smoking status: Never   Smokeless tobacco: Never  Substance Use Topics   Alcohol use: No   No Known Allergies Prior to Admission medications   Medication Sig Start Date End Date Taking? Authorizing Provider  amiodarone (PACERONE) 200 MG tablet Take 1 tablet (200 mg total) by mouth 2 (two) times daily. 07/24/21  Yes Dessa Phi, DO  apixaban (ELIQUIS) 5 MG TABS tablet Take 1 tablet (5 mg total) by mouth 2 (two) times daily. 07/24/21  Yes Dessa Phi, DO  cetirizine (ZYRTEC) 10 MG tablet Take 1 tablet (10 mg total) by mouth daily. 03/18/18  Yes Jerrol Banana., MD  fluticasone [facility]) 50 MCG/ACT nasal spray Place into the nose. 02/24/13  Yes [provider]  Glucagon (GVOKE HYPOPEN 1-PACK) 1 MG/0.2ML SOAJ Inject 1  mg into the skin every 15 (fifteen) minutes as needed for up to 5 doses. 06/22/21  Yes Myles Gip, DO  glucose blood (ONE TOUCH ULTRA TEST) test strip Use as instructed 12/10/17  Yes Jerrol Banana., MD  insulin glargine (LANTUS SOLOSTAR) 100 UNIT/ML Solostar Pen Inject 15 Units into the skin 2 (two) times daily. 07/24/21  Yes Dessa Phi, DO  Insulin Pen Needle 32G X 4 MM MISC Inject twice daily, SQ. DX E11.9 11/19/19  Yes Jerrol Banana., MD  levothyroxine (SYNTHROID) 200 MCG tablet TAKE 1 TABLET(200 MCG) BY MOUTH DAILY BEFORE BREAKFAST 06/22/21  Yes Rumball, Jake Church, DO  losartan (COZAAR) 25 MG tablet Take 0.5 tablets (12.5 mg total) by mouth every evening. 07/24/21  Yes Dessa Phi, DO  metoprolol succinate (TOPROL-XL) 25 MG 24 hr tablet Take 0.5 tablets (12.5 mg total) by mouth daily. 07/25/21  Yes Dessa Phi, DO  rosuvastatin (CRESTOR) 10 MG tablet TAKE 1 TABLET BY MOUTH  DAILY 11/28/19  Yes Gollan, Kathlene November, MD  Syringe/Needle, Disp, (SYRINGE 3CC/25GX1") 25G X 1" 3 ML MISC 1 mL by Does not apply route every 30 (thirty) days. 05/03/16  Yes Jerrol Banana., MD  torsemide (DEMADEX) 20 MG tablet Take 40 mg by mouth 2 (two) times daily. 07/24/21  Yes [provider]  Blood Glucose Monitoring Suppl (ONE TOUCH ULTRA SYSTEM KIT) w/Device KIT Check sugar once daily. DX E11.9 10/18/16   Jerrol Banana., MD  Lancets [facility] ULTRASOFT) lancets Check sugar once daily DX E11.9 12/10/17   Jerrol Banana., MD  NON FORMULARY CPAP AT BEDTIME    [provider]    Review of Systems  Constitutional:  Positive for fatigue (improving). Negative for appetite change.  HENT:  Negative for congestion, postnasal drip and sore throat.   Eyes: Negative.   Respiratory:  Negative for cough and shortness of breath.   Cardiovascular:  Positive for leg swelling. Negative for chest pain and palpitations.  Gastrointestinal:  Negative for abdominal distention and  abdominal pain.  Endocrine: Negative.   Genitourinary: Negative.   Musculoskeletal:  Negative for back pain and neck pain.  Skin: Negative.   Allergic/Immunologic: Negative.   Neurological:  Positive for weakness (legs). Negative for dizziness and light-headedness.  Hematological:  Negative for adenopathy. Bruises/bleeds easily.  Psychiatric/Behavioral:  Negative for dysphoric mood and sleep disturbance (has CPAP; sleeping on 2 pillows). The patient is not nervous/anxious.    Vitals:   07/26/21 1156  BP: 96/65  Pulse: 71  Resp: 16  SpO2: 100%  Weight: 191 lb 8 oz (86.9 kg)  Height: '5\' 2"'  (1.575 m)   Wt Readings from Last 3 Encounters:  07/26/21 191 lb 8 oz (86.9 kg)  07/24/21 193 lb 3.2 oz (87.6 kg)  07/13/21 202 lb 1.6 oz (91.7 kg)   Lab Results  Component Value Date   CREATININE 1.35 (H) 07/26/2021   CREATININE 1.31 (H) 07/24/2021   CREATININE 1.10 (H) 07/23/2021   Physical Exam Vitals and nursing note reviewed. Exam conducted with a chaperone present (sister in law).  Constitutional:      Appearance: Normal appearance.  HENT:     Head: Normocephalic and atraumatic.  Cardiovascular:     Rate and Rhythm: Normal rate and regular rhythm.  Pulmonary:     Effort: Pulmonary effort is normal. No respiratory distress.     Breath sounds: No wheezing or rales.  Abdominal:     General: There is no distension.     Palpations: Abdomen is soft.  Musculoskeletal:        General: No tenderness.     Cervical back: Normal range of motion and neck supple.     Right lower leg: No edema.     Left lower leg: No edema.  Skin:    General: Skin is warm and dry.     Findings: Bruising (entire right forearm) present.  Neurological:     General: No focal deficit present.     Mental Status: She is alert and oriented to person, place, and time.  Psychiatric:        Mood and Affect: Mood normal.        Behavior: Behavior normal.        Thought Content: Thought content normal.    Assessment & Plan:  1: Chronic heart failure with reduced ejection fraction- - NYHA class II - euvolemic today - not  weighing daily due to stability concerns; encouraged her to try straddling her scale with her walker if she felt secure enough; to call for an overnight weight gain of > 2 pounds or a weekly weight gain of > 5 pounds - admits to adding "some" salt but reports doing better than she was; reviewed the importance of not adding any salt to her food & reading food labels to keep sodium content to ~ 2058m / day; low sodium cookbook provided - on GDMT of losartan - currently BP will not allow for transition to entresto or adding spironolactone/ SGLT2 - BNP 07/13/21 was 738.3  2: HTN- - BP low (96/65) but currently without dizziness - will decrease her torsemide to 446mdaily (was BID); advised that she could take an additional 20-4025mRN if needed for above weight gain, swelling or shortness of breath - saw PCP (PaRollene Rotunda/4/23; returns 07/29/21 - BMP 07/24/21 reviewed and showed sodium 137, potassium 3.7, creatinine 1.31 & GFR 45 - will recheck this today  3: DM- - A1c 06/22/21 was 10.0% - glucose at home today was 210  4: Obstructive sleep apnea- - has CPAP but hasn't been wearing it nightly - says that she's transitioning to a new bed but is going to start wearing it again soon  5: Atrial fibrillation- - cardioverted during recent admission - to see cardiology (BeSharolyn Douglas/27/23 - currently taking amiodarone and apixaban   Medication list reviewed.   Return in 1 month, sooner if needed.

## 2021-07-25 NOTE — Telephone Encounter (Signed)
Pt c/o medication issue:  1. Name of Medication: torsemide   2. How are you currently taking this medication (dosage and times per day)? 40 mg po BID  3. Are you having a reaction (difficulty breathing--STAT)?  No   4. What is your medication issue? Voiding frequently up all night to bathroom  Patient and sister want to check on dose

## 2021-07-26 ENCOUNTER — Ambulatory Visit: Payer: Medicare Other | Attending: Family | Admitting: Family

## 2021-07-26 ENCOUNTER — Other Ambulatory Visit: Payer: Self-pay

## 2021-07-26 ENCOUNTER — Encounter: Payer: Self-pay | Admitting: Family

## 2021-07-26 VITALS — BP 96/65 | HR 71 | Resp 16 | Ht 62.0 in | Wt 191.5 lb

## 2021-07-26 DIAGNOSIS — I272 Pulmonary hypertension, unspecified: Secondary | ICD-10-CM | POA: Insufficient documentation

## 2021-07-26 DIAGNOSIS — I10 Essential (primary) hypertension: Secondary | ICD-10-CM

## 2021-07-26 DIAGNOSIS — Z833 Family history of diabetes mellitus: Secondary | ICD-10-CM | POA: Insufficient documentation

## 2021-07-26 DIAGNOSIS — I2510 Atherosclerotic heart disease of native coronary artery without angina pectoris: Secondary | ICD-10-CM | POA: Diagnosis not present

## 2021-07-26 DIAGNOSIS — K219 Gastro-esophageal reflux disease without esophagitis: Secondary | ICD-10-CM | POA: Insufficient documentation

## 2021-07-26 DIAGNOSIS — I5022 Chronic systolic (congestive) heart failure: Secondary | ICD-10-CM | POA: Insufficient documentation

## 2021-07-26 DIAGNOSIS — Z8249 Family history of ischemic heart disease and other diseases of the circulatory system: Secondary | ICD-10-CM | POA: Diagnosis not present

## 2021-07-26 DIAGNOSIS — N1831 Chronic kidney disease, stage 3a: Secondary | ICD-10-CM

## 2021-07-26 DIAGNOSIS — Z7901 Long term (current) use of anticoagulants: Secondary | ICD-10-CM | POA: Insufficient documentation

## 2021-07-26 DIAGNOSIS — E039 Hypothyroidism, unspecified: Secondary | ICD-10-CM | POA: Insufficient documentation

## 2021-07-26 DIAGNOSIS — Z91199 Patient's noncompliance with other medical treatment and regimen due to unspecified reason: Secondary | ICD-10-CM | POA: Insufficient documentation

## 2021-07-26 DIAGNOSIS — E1122 Type 2 diabetes mellitus with diabetic chronic kidney disease: Secondary | ICD-10-CM

## 2021-07-26 DIAGNOSIS — I4891 Unspecified atrial fibrillation: Secondary | ICD-10-CM | POA: Diagnosis not present

## 2021-07-26 DIAGNOSIS — I428 Other cardiomyopathies: Secondary | ICD-10-CM | POA: Diagnosis not present

## 2021-07-26 DIAGNOSIS — Z79899 Other long term (current) drug therapy: Secondary | ICD-10-CM | POA: Insufficient documentation

## 2021-07-26 DIAGNOSIS — E785 Hyperlipidemia, unspecified: Secondary | ICD-10-CM | POA: Insufficient documentation

## 2021-07-26 DIAGNOSIS — Z853 Personal history of malignant neoplasm of breast: Secondary | ICD-10-CM | POA: Insufficient documentation

## 2021-07-26 DIAGNOSIS — G4733 Obstructive sleep apnea (adult) (pediatric): Secondary | ICD-10-CM | POA: Diagnosis not present

## 2021-07-26 DIAGNOSIS — Z794 Long term (current) use of insulin: Secondary | ICD-10-CM

## 2021-07-26 DIAGNOSIS — E119 Type 2 diabetes mellitus without complications: Secondary | ICD-10-CM | POA: Diagnosis not present

## 2021-07-26 DIAGNOSIS — I110 Hypertensive heart disease with heart failure: Secondary | ICD-10-CM | POA: Diagnosis present

## 2021-07-26 DIAGNOSIS — I4819 Other persistent atrial fibrillation: Secondary | ICD-10-CM

## 2021-07-26 LAB — BASIC METABOLIC PANEL
Anion gap: 13 (ref 5–15)
BUN: 65 mg/dL — ABNORMAL HIGH (ref 8–23)
CO2: 33 mmol/L — ABNORMAL HIGH (ref 22–32)
Calcium: 8.9 mg/dL (ref 8.9–10.3)
Chloride: 93 mmol/L — ABNORMAL LOW (ref 98–111)
Creatinine, Ser: 1.35 mg/dL — ABNORMAL HIGH (ref 0.44–1.00)
GFR, Estimated: 44 mL/min — ABNORMAL LOW (ref >60)
Glucose, Bld: 267 mg/dL — ABNORMAL HIGH (ref 70–99)
Potassium: 3.6 mmol/L (ref 3.5–5.1)
Sodium: 139 mmol/L (ref 135–145)

## 2021-07-26 NOTE — Patient Instructions (Addendum)
Continue weighing daily and call for an overnight weight gain of 3 pounds or more or a weekly weight gain of more than 5 pounds.   The [facility] will be moving around the corner to [id number] mid-February. Our phone number will remain the same    Decrease your fluid pill (torsemide ) to 2 tablets every morning. You can take an additional 1-2 tablets in the evening if needed for above weight gain or worsening swelling.

## 2021-07-27 ENCOUNTER — Telehealth: Payer: Self-pay | Admitting: Family Medicine

## 2021-07-27 ENCOUNTER — Ambulatory Visit (INDEPENDENT_AMBULATORY_CARE_PROVIDER_SITE_OTHER): Payer: Medicare Other | Admitting: Podiatry

## 2021-07-27 ENCOUNTER — Other Ambulatory Visit: Payer: Self-pay

## 2021-07-27 DIAGNOSIS — M79675 Pain in left toe(s): Secondary | ICD-10-CM

## 2021-07-27 DIAGNOSIS — M79674 Pain in right toe(s): Secondary | ICD-10-CM | POA: Diagnosis not present

## 2021-07-27 DIAGNOSIS — M2142 Flat foot [pes planus] (acquired), left foot: Secondary | ICD-10-CM

## 2021-07-27 DIAGNOSIS — E1142 Type 2 diabetes mellitus with diabetic polyneuropathy: Secondary | ICD-10-CM | POA: Diagnosis not present

## 2021-07-27 DIAGNOSIS — L309 Dermatitis, unspecified: Secondary | ICD-10-CM | POA: Diagnosis not present

## 2021-07-27 DIAGNOSIS — Z794 Long term (current) use of insulin: Secondary | ICD-10-CM

## 2021-07-27 DIAGNOSIS — M2141 Flat foot [pes planus] (acquired), right foot: Secondary | ICD-10-CM

## 2021-07-27 DIAGNOSIS — B351 Tinea unguium: Secondary | ICD-10-CM

## 2021-07-27 MED ORDER — CLOTRIMAZOLE-BETAMETHASONE 1-0.05 % EX CREA: 1.0000 "application " | TOPICAL_CREAM | Freq: Two times a day (BID) | CUTANEOUS | 2 refills | Status: DC

## 2021-07-27 NOTE — Progress Notes (Signed)
°  Subjective:  Patient ID: Michele [name], female    DOB: 1955-11-01,  MRN: [id number]  Chief Complaint  Patient presents with   Diabetes    Diabetic foot exam, A1C 10.0   Foot Swelling   Eczema    66 y.o. female presents with the above complaint. History confirmed with patient.  She has thickened elongated toenails that are causing discomfort and pain.  She has cracking scaling skin in both feet the symptoms well.  Was recently hospitalized.  Her A1c has been elevated recently at 10% and is now down to 9%.  Objective:  Physical Exam: warm, good capillary refill, DP reduced bilateral, no trophic changes or ulcerative lesions, PT reduced bilateral, and dry cracking scaling skin.  Pes planus foot deformity.  Reduced protective sensation. Left Foot: dystrophic yellowed discolored nail plates with subungual debris Right Foot: dystrophic yellowed discolored nail plates with subungual debris  Assessment:  No diagnosis found.   Plan:  Patient was evaluated and treated and all questions answered.  Patient educated on diabetes. Discussed proper diabetic foot care and discussed risks and complications of disease. Educated patient in depth on reasons to return to the office immediately should he/she discover anything concerning or new on the feet. All questions answered. Discussed proper shoes as well.   Overall she remains at high risk of complications.  She has pes planus foot deformity and peripheral neuropathy secondary to her diabetes.  She would be best served in an extra-depth diabetic shoe with a custom molded insole.  I will have her scheduled for an pedorthist for these.  Discussed the etiology and treatment options for the condition in detail with the patient. Educated patient on the topical and oral treatment options for mycotic nails. Recommended debridement of the nails today. Sharp and mechanical debridement performed of all painful and mycotic nails today. Nails debrided in length  and thickness using a nail nipper to level of comfort. Discussed treatment options including appropriate shoe gear. Follow up as needed for painful nails.  Also appears to have quite a bit of dermatitis.  I have prescribed her Lotrisone cream for this.  If not improving with 1 month would consider a biopsy of the area to elucidate etiology.  Rx sent to pharmacy.  Return in about 3 months (around 10/25/2021) for at risk diabetic foot care.

## 2021-07-27 NOTE — Telephone Encounter (Signed)
Michele [name] with [facility] is calling in for VO for Plan of care for physical therapy services.    PT frequency: 1 week 1; 2 week 4; 1 week 4   CB: [phone] (secure voicemail)

## 2021-07-28 NOTE — Telephone Encounter (Signed)
Patient states this is not necessary PT came out to her home yesterday 07/27/21.KW

## 2021-07-29 ENCOUNTER — Encounter: Payer: Self-pay | Admitting: Family Medicine

## 2021-07-29 ENCOUNTER — Other Ambulatory Visit: Payer: Self-pay

## 2021-07-29 ENCOUNTER — Ambulatory Visit (INDEPENDENT_AMBULATORY_CARE_PROVIDER_SITE_OTHER): Payer: Medicare Other | Admitting: Family Medicine

## 2021-07-29 ENCOUNTER — Telehealth: Payer: Self-pay | Admitting: *Deleted

## 2021-07-29 VITALS — BP 103/72 | HR 73 | Temp 98.4°F | Resp 16 | Wt 192.9 lb

## 2021-07-29 DIAGNOSIS — Z09 Encounter for follow-up examination after completed treatment for conditions other than malignant neoplasm: Secondary | ICD-10-CM | POA: Insufficient documentation

## 2021-07-29 DIAGNOSIS — L89152 Pressure ulcer of sacral region, stage 2: Secondary | ICD-10-CM | POA: Diagnosis not present

## 2021-07-29 DIAGNOSIS — I5022 Chronic systolic (congestive) heart failure: Secondary | ICD-10-CM

## 2021-07-29 NOTE — Assessment & Plan Note (Signed)
Stg II on sacral from inpatient stay Encourage thick zinc based cream Cleanse to base of skin once/every 24 hours Encourage repositioning every 1 hour while in chair Encourage repositioning every 2 hours while in bed

## 2021-07-29 NOTE — Assessment & Plan Note (Signed)
BMI >35 Heart Fx DM Discussed importance of healthy weight management Discussed diet and exercise

## 2021-07-29 NOTE — Chronic Care Management (AMB) (Signed)
Chronic Care Management   Note  07/29/2021 Name: Michele [name] MRN: [id number] DOB: [birth year]  [name] is a 66 y.o. year old female who is a primary care patient of Jerrol Banana., MD. I reached out to [facility] by phone today in response to a referral sent by [name] PCP.  [name] was given information about Chronic Care Management services today including:  CCM service includes personalized support from designated clinical staff supervised by her physician, including individualized plan of care and coordination with other care providers 24/7 contact phone numbers for assistance for urgent and routine care needs. Service will only be billed when office clinical staff spend 20 minutes or more in a month to coordinate care. Only one practitioner may furnish and bill the service in a calendar month. The patient may stop CCM services at any time (effective at the end of the month) by phone call to the office staff. The patient is responsible for co-pay (up to 20% after annual deductible is met) if co-pay is required by the individual health plan.   Patient agreed to services and verbal consent obtained.   Follow up plan: Telephone appointment with care management team member scheduled for: 08/02/2021  Julian Hy, [facility]  Direct Dial: [phone]

## 2021-07-29 NOTE — Telephone Encounter (Signed)
Verbal okay was given. 

## 2021-07-29 NOTE — Progress Notes (Signed)
Established patient visit   Patient: Michele [name]   DOB: 06/19/1956   66 y.o. Female  MRN: [id number] Visit Date: 07/29/2021  Today's healthcare provider: Gwyneth Sprout, FNP   Chief Complaint  Patient presents with   Hospitalization Follow-up   Subjective    HPI  Follow up Hospitalization  Patient was admitted to [facility] on 07/13/2021 and discharged on 07/24/2021. She was treated for Acute respiratory failure with hypoxia. Treatment for this included scheduled follow up with cardiology. D.C allopurinol, amlodipine, bystolic and glimepiride . Start Eliquis, Metoprolol , Pacerone and toresmide  Telephone follow up was done on 07/25/21 She reports excellent compliance with treatment.Patient states that she saw Darylene Price from cardiology and states that Toresmide dose was decreased.  She reports this condition is improved.  ----------------------------------------------------------------------------------------- -   Medications: Outpatient Medications Prior to Visit  Medication Sig   amiodarone (PACERONE) 200 MG tablet Take 1 tablet (200 mg total) by mouth 2 (two) times daily.   apixaban (ELIQUIS) 5 MG TABS tablet Take 1 tablet (5 mg total) by mouth 2 (two) times daily.   Blood Glucose Monitoring Suppl (ONE TOUCH ULTRA SYSTEM KIT) w/Device KIT Check sugar once daily. DX E11.9   cetirizine (ZYRTEC) 10 MG tablet Take 1 tablet (10 mg total) by mouth daily.   clotrimazole-betamethasone (LOTRISONE) cream Apply 1 application topically 2 (two) times daily.   fluticasone (FLONASE) 50 MCG/ACT nasal spray Place into the nose.   Glucagon (GVOKE HYPOPEN 1-PACK) 1 MG/0.2ML SOAJ Inject 1 mg into the skin every 15 (fifteen) minutes as needed for up to 5 doses.   glucose blood (ONE TOUCH ULTRA TEST) test strip Use as instructed   insulin glargine (LANTUS SOLOSTAR) 100 UNIT/ML Solostar Pen Inject 15 Units into the skin 2 (two) times daily.   Insulin Pen Needle 32G X 4 MM MISC Inject twice daily,  SQ. DX E11.9   Lancets (ONETOUCH ULTRASOFT) lancets Check sugar once daily DX E11.9   levothyroxine (SYNTHROID) 200 MCG tablet TAKE 1 TABLET(200 MCG) BY MOUTH DAILY BEFORE BREAKFAST   losartan (COZAAR) 25 MG tablet Take 0.5 tablets (12.5 mg total) by mouth every evening.   metoprolol succinate (TOPROL-XL) 25 MG 24 hr tablet Take 0.5 tablets (12.5 mg total) by mouth daily.   NON FORMULARY CPAP AT BEDTIME   rosuvastatin (CRESTOR) 10 MG tablet TAKE 1 TABLET BY MOUTH  DAILY   Syringe/Needle, Disp, (SYRINGE 3CC/25GX1") 25G X 1" 3 ML MISC 1 mL by Does not apply route every 30 (thirty) days.   torsemide (DEMADEX) 20 MG tablet Take 40 mg by mouth 2 (two) times daily.   No facility-administered medications prior to visit.    Review of Systems     Objective    BP 103/72    Pulse 73    Temp 98.4 F (36.9 C) (Oral)    Resp 16    Wt 192 lb 14.4 oz (87.5 kg)    BMI 35.28 kg/m    Physical Exam Vitals and nursing note reviewed.  Constitutional:      General: She is not in acute distress.    Appearance: Normal appearance. She is obese. She is not ill-appearing, toxic-appearing or diaphoretic.  HENT:     Head: Normocephalic and atraumatic.  Cardiovascular:     Rate and Rhythm: Normal rate and regular rhythm.     Pulses: Normal pulses.     Heart sounds: Normal heart sounds. No murmur heard.   No friction rub. No gallop.  Pulmonary:     Effort: Pulmonary effort is normal. No respiratory distress.     Breath sounds: Normal breath sounds. No stridor. No wheezing, rhonchi or rales.  Chest:     Chest wall: No tenderness.  Abdominal:     General: Bowel sounds are normal.     Palpations: Abdomen is soft.  Musculoskeletal:        General: No swelling, tenderness, deformity or signs of injury. Normal range of motion.     Right lower leg: No edema.     Left lower leg: No edema.  Skin:    General: Skin is warm and dry.     Capillary Refill: Capillary refill takes less than 2 seconds.      Coloration: Skin is not jaundiced or pale.     Findings: No bruising, erythema, lesion or rash.  Neurological:     General: No focal deficit present.     Mental Status: She is alert and oriented to person, place, and time. Mental status is at baseline.     Cranial Nerves: No cranial nerve deficit.     Sensory: No sensory deficit.     Motor: No weakness.     Coordination: Coordination normal.  Psychiatric:        Mood and Affect: Mood normal.        Behavior: Behavior normal.        Thought Content: Thought content normal.        Judgment: Judgment normal.     No results found for any visits on 07/29/21.  Assessment & Plan     Problem List Items Addressed This Visit       Cardiovascular and Mediastinum   CHF (congestive heart failure) ([facility]) - Primary   Relevant Orders   AMB Referral to Community Care Coordinaton     Other   Morbid obesity ([place])    BMI >35 Heart Fx DM Discussed importance of healthy weight management Discussed diet and exercise       Pressure injury of skin    Stg II on sacral from inpatient stay Encourage thick zinc based cream Cleanse to base of skin once/every 24 hours Encourage repositioning every 1 hour while in chair Encourage repositioning every 2 hours while in bed      Hospital discharge follow-up    appearance improved Seen with family In wheel chair Continue f/u with cards         No follow-ups on file.      Vonna Kotyk, FNP, have reviewed all documentation for this visit. The documentation on 07/29/21 for the exam, diagnosis, procedures, and orders are all accurate and complete.  Patient seen and examined by Tally Joe,  FNP note scribed by [employer], [place], [place] [phone] (phone) [phone] (fax)  [place]

## 2021-07-29 NOTE — Assessment & Plan Note (Signed)
appearance improved Seen with family In wheel chair Continue f/u with cards

## 2021-08-01 ENCOUNTER — Telehealth: Payer: Self-pay | Admitting: Family Medicine

## 2021-08-01 NOTE — Telephone Encounter (Signed)
I will fill out form and leave in your box to sign off. In order for her to get cpap supplies she hd to have a sleep study within 10 years. I do not see this in her chart will you please double check behind me. If there is not one in chart then we would have to do sleep study first. Please review. KW

## 2021-08-01 NOTE — Telephone Encounter (Signed)
Pt stated she would like to start using her CPAP machine again. Stated she needs the mask and head strap. She has plenty of hoses. Stated she used to go through [facility] [employer] Would like order sent there. Please advise.

## 2021-08-02 ENCOUNTER — Ambulatory Visit (INDEPENDENT_AMBULATORY_CARE_PROVIDER_SITE_OTHER): Payer: Medicare Other

## 2021-08-02 ENCOUNTER — Telehealth: Payer: Self-pay

## 2021-08-02 DIAGNOSIS — I5022 Chronic systolic (congestive) heart failure: Secondary | ICD-10-CM

## 2021-08-02 DIAGNOSIS — Z794 Long term (current) use of insulin: Secondary | ICD-10-CM

## 2021-08-02 DIAGNOSIS — Z9181 History of falling: Secondary | ICD-10-CM

## 2021-08-02 DIAGNOSIS — E7849 Other hyperlipidemia: Secondary | ICD-10-CM

## 2021-08-02 DIAGNOSIS — I10 Essential (primary) hypertension: Secondary | ICD-10-CM

## 2021-08-02 NOTE — Chronic Care Management (AMB) (Signed)
Chronic Care Management   CCM RN Visit Note  08/02/2021 Name: Michele [name] MRN: [id number] DOB: 1955-11-07  Subjective: Michele [name] is a 66 y.o. year old female who is a primary care patient of Jerrol Banana., MD. The care management team was consulted for assistance with disease management and care coordination needs.    Engaged with patient by telephone for initial visit in response to provider referral for case management and care coordination services.   Consent to Services:  The patient was given the following information about Chronic Care Management services: 1. CCM service includes personalized support from designated clinical staff supervised by the primary care provider, including individualized plan of care and coordination with other care providers 2. 24/7 contact phone numbers for assistance for urgent and routine care needs. 3. Service will only be billed when office clinical staff spend 20 minutes or more in a month to coordinate care. 4. Only one practitioner may furnish and bill the service in a calendar month. 5.The patient may stop CCM services at any time (effective at the end of the month) by phone call to the office staff. 6. The patient will be responsible for cost sharing (co-pay) of up to 20% of the service fee (after annual deductible is met). Patient agreed to services and consent obtained.   Assessment: Review of patient past medical history, allergies, medications, health status, including review of consultants reports, laboratory and other test data, was performed as part of comprehensive evaluation and provision of chronic care management services.   SDOH (Social Determinants of Health) assessments and interventions performed:   SDOH Interventions    Flowsheet Row Most Recent Value  SDOH Interventions   Food Insecurity Interventions Other (Comment)  [Interested in receiving prepared meals.]  Transportation Interventions Other (Comment)  [Patient  currently working with a [employer]. Will assist with arranging transportation for February appointment.]        CCM Care Plan  No Known Allergies  Outpatient Encounter Medications as of 08/02/2021  Medication Sig Note   amiodarone (PACERONE) 200 MG tablet Take 1 tablet (200 mg total) by mouth 2 (two) times daily.    apixaban (ELIQUIS) 5 MG TABS tablet Take 1 tablet (5 mg total) by mouth 2 (two) times daily.    insulin glargine (LANTUS SOLOSTAR) 100 UNIT/ML Solostar Pen Inject 15 Units into the skin 2 (two) times daily.    levothyroxine (SYNTHROID) 200 MCG tablet TAKE 1 TABLET(200 MCG) BY MOUTH DAILY BEFORE BREAKFAST    losartan (COZAAR) 25 MG tablet Take 0.5 tablets (12.5 mg total) by mouth every evening.    metoprolol succinate (TOPROL-XL) 25 MG 24 hr tablet Take 0.5 tablets (12.5 mg total) by mouth daily.    rosuvastatin (CRESTOR) 10 MG tablet TAKE 1 TABLET BY MOUTH  DAILY    torsemide (DEMADEX) 20 MG tablet Take 40 mg by mouth 2 (two) times daily.    Blood Glucose Monitoring Suppl (ONE TOUCH ULTRA SYSTEM KIT) w/Device KIT Check sugar once daily. DX E11.9    cetirizine (ZYRTEC) 10 MG tablet Take 1 tablet (10 mg total) by mouth daily. 08/02/2021: Reports taking only as needed   clotrimazole-betamethasone (LOTRISONE) cream Apply 1 application topically 2 (two) times daily.    fluticasone (FLONASE) 50 MCG/ACT nasal spray Place into the nose. 08/02/2021: Reports taking only as needed.   Glucagon (GVOKE HYPOPEN 1-PACK) 1 MG/0.2ML SOAJ Inject 1 mg into the skin every 15 (fifteen) minutes as needed for up to 5 doses. (Patient not taking: Reported  on 08/02/2021)    glucose blood (ONE TOUCH ULTRA TEST) test strip Use as instructed    Insulin Pen Needle 32G X 4 MM MISC Inject twice daily, SQ. DX E11.9    Lancets (ONETOUCH ULTRASOFT) lancets Check sugar once daily DX E11.9    NON FORMULARY CPAP AT BEDTIME    Syringe/Needle, Disp, (SYRINGE 3CC/25GX1") 25G X 1" 3 ML MISC 1 mL by Does not apply route  every 30 (thirty) days.    No facility-administered encounter medications on file as of 08/02/2021.    Patient Active Problem List   Diagnosis Date Noted   Hospital discharge follow-up 07/29/2021   Coronary artery disease involving native heart    Acute systolic heart failure (HCC)    Cardiogenic shock (HCC)    Pressure injury of skin 07/14/2021   Dilated cardiomyopathy ([place]) 07/14/2021   Dyspnea on exertion 07/13/2021   Atrial fibrillation ([place]) 07/13/2021   Atrial fibrillation with RVR ([place]) 07/13/2021   Fatigue due to excessive exertion 07/13/2021   Fissure in skin of foot 07/13/2021   Sinus congestion 07/13/2021   Acute respiratory failure with hypoxia (HCC) 07/13/2021   OSA (obstructive sleep apnea) 06/22/2021   CHF (congestive heart failure) ([place]) 06/22/2021   Neutrophilia 12/16/2019   PVC (premature ventricular contraction) 05/14/2018   Fat necrosis (segmental) of breast 12/30/2015   Allergic rhinitis 11/28/2014   Acid reflux 11/28/2014   Adult hypothyroidism 11/28/2014   DCIS (ductal carcinoma in situ) 06/03/2014   Diabetes mellitus ([place]) 03/14/2012   Shortness of breath 12/13/2011   Atherosclerosis of native coronary artery of native heart with stable angina pectoris ([place]) 12/13/2011   Morbid obesity ([place]) 12/13/2011   Hyperlipidemia 12/13/2011   Hypertension 12/13/2011     Patient Care Plan: RN Care Management Plan of Care     Problem Identified: DM, HLD, HTN, CHF, A-Fib and Fall Risk      Long-Range Goal: Disease Progression Prevented or Minimized   Start Date: 08/02/2021  Expected End Date: 10/31/2021  Priority: High  Note:   Current Barriers:  Chronic Disease Management support and education needs related to CHF, HTN, HLD, DMII, A-Fib and Fall Risk.  RNCM Clinical Goal(s):  Patient will demonstrate improved adherence to prescribed treatment plan for CHF, HTN, HLD, DMII, A-Fib and Fall Risk through collaboration with the provider,  RN Care Manager and the  care team.   Interventions: 1:1 collaboration with primary care provider regarding development and update of comprehensive plan of care as evidenced by provider attestation and co-signature Inter-disciplinary care team collaboration (see longitudinal plan of care) Evaluation of current treatment plan related to  self management and patient's adherence to plan as established by provider   Heart Failure Interventions:  (Status:  New goal.) Long Term Goal Wt Readings from Last 3 Encounters:  07/29/21 192 lb 14.4 oz (87.5 kg)  07/26/21 191 lb 8 oz (86.9 kg)  07/24/21 193 lb 3.2 oz (87.6 kg)  Discussed current plan for CHF management.  Provided information regarding recommended weight parameters. Encouraged to weigh daily and record readings. Advised to weigh at least once a week if unable to weigh daily. Encouraged to notify provider for weight gain greater than 3 lbs overnight or weight gain greater than 5 lbs within a week.  Discussed s/sx of fluid overload and indications for notifying a provider. Encouraged to assess symptoms daily and contact clinic with concerns as needed. Discussed nutritional intake. Advised to closely monitor sodium consumption and avoid highly processed foods when possible. Reviewed  worsening s/sx related to CHF exacerbation and indications for seeking immediate medical attention.   Diabetes Interventions:  (Status:  New goal.) Long Term Goal Assessed patient's understanding of A1c goal: <7% Lab Results  Component Value Date   HGBA1C 10.0 (A) 06/22/2021  Reviewed medications and plan for diabetes management. Reviewed blood glucose readings and importance of consistent monitoring. Advised to monitor daily and maintain a log. Reviewed s/sx of hypoglycemia and hyperglycemia along with recommended interventions. Recalls several elevated morning readings in the 200's. Denies experiencing symptoms. Unable to recall if levels were checked before or after morning meal.  Agreed to monitor fasting levels and record readings.  Discussed nutritional intake and importance of complying with an ADA/modified carb diet. Confirmed speaking with a [employer] to address needs regarding meals and nutrition assistance. Discussed and offered referrals for available Diabetes education classes. Declined need for additional diabetes education resources. Her A1C is not at goal. Will follow up with the team if she requires additional assistance r/t nutrition. Discussed importance of completing recommended DM preventive care. Recalls completing a foot exam. Reports completing daily foot care as advised. She is due for an annual Eye Exam. Will assist with arranging an appointment at [facility].    Falls Interventions:  (Status:  New goal.) Long Term Goal Provided information regarding safety and fall prevention measures. Reports using a walker or wheelchair as advised. Reviewed medications and discussed potential side effects. Advised patient of importance of notifying provider of falls. Assessed for falls since last encounter. Discussed ability to perform ADL's and tasks in the home. Assessed social determinant of health barriers. Reports performing ADL's independently. Reports family members are currently available to assist as needed. Agreed to update the care management team if her functional status declines.   Hyperlipidemia Interventions:  (Status:  New goal.) Long Term Goal Lab Results  Component Value Date   CHOL 113 06/22/2021   HDL 33 (L) 06/22/2021   LDLCALC 60 06/22/2021   TRIG 110 06/22/2021   CHOLHDL 3.4 06/22/2021    Medications reviewed. Reports taking medications as prescribed and tolerating regimen. Reviewed provider established cholesterol goals. Discussed importance of regular laboratory monitoring as prescribed Reviewed role and benefits of statin for ASCVD risk reduction Discussed strategies to manage statin-induced  myalgias Reviewed importance of limiting foods high in cholesterol   Hypertension Interventions:  (Status:  New goal.) Long Term Goal Last practice recorded BP readings:  BP Readings from Last 3 Encounters:  07/29/21 103/72  07/26/21 96/65  07/24/21 105/68  Most recent eGFR/CrCl:  Lab Results  Component Value Date   EGFR 38 (L) 06/22/2021    No components found for: CRCL  Reviewed medications plan for hypertension management. Provided information regarding established blood pressure parameters along with indications for notifying a provider. Advised to monitor and record readings.  Discussed compliance with recommended cardiac prudent diet. Encouraged to read nutrition labels and avoid highly processed foods when possible. Discussed options regarding nutritional assistance and prepared meals. Confirmed speaking with a [employer]. Will follow up if additional assistance is required. Reviewed s/sx of heart attack, stroke and worsening symptoms that require immediate medical attention.   Patient Goals/Self-Care Activities: Take all medications as prescribed Attend all scheduled provider appointments Call pharmacy for medication refills 3-7 days in advance of running out of medications Perform all self care activities independently  Call provider office for new concerns or questions    Follow Up Plan:   Will follow up to address  updates regarding transportation needs within the next week.      PLAN: Will follow up within the next week.   Cristy [facility]lander [facility] Care Management [facility] [phone]

## 2021-08-02 NOTE — Telephone Encounter (Signed)
° °  Telephone encounter was:  Successful.  08/02/2021 Name: Michele [name] MRN: [id number] DOB: 03/07/1956  Michele [name] is a 66 y.o. year old female who is a primary care patient of Jerrol Banana., MD . The community resource team was consulted for assistance with Food Insecurity and home aid assistance   Care guide performed the following interventions: Patient provided with information about care guide support team and interviewed to confirm resource needs.Pt stated she does not need transportation at this time nor MOW and home aids. However she would like information for meal prep resources and transportation for later to be emailed to her  Follow Up Plan: I will e-mail the patient with resources and follow up with her in a few days    [place], Care Management  [phone] [address], [place], [place] [address] Phone: [phone] Email: [email or web address]

## 2021-08-02 NOTE — Patient Instructions (Addendum)
Visit Information   Thank you for taking time to visit with me today. Please don't hesitate to contact me if I can be of assistance to you before our next scheduled telephone appointment.  Care Plan: Patient Care Plan: RN Care Management Plan of Care     Problem Identified: DM, HLD, HTN, CHF, A-Fib and Fall Risk      Long-Range Goal: Disease Progression Prevented or Minimized   Start Date: 08/02/2021  Expected End Date: 10/31/2021  Priority: High  Note:   Current Barriers:  Chronic Disease Management support and education needs related to CHF, HTN, HLD, DMII, A-Fib and Fall Risk.  RNCM Clinical Goal(s):  Patient will demonstrate improved adherence to prescribed treatment plan for CHF, HTN, HLD, DMII, A-Fib and Fall Risk through collaboration with the provider,  RN Care Manager and the care team.   Interventions: 1:1 collaboration with primary care provider regarding development and update of comprehensive plan of care as evidenced by provider attestation and co-signature Inter-disciplinary care team collaboration (see longitudinal plan of care) Evaluation of current treatment plan related to  self management and patient's adherence to plan as established by provider   Heart Failure Interventions:  (Status:  New goal.) Long Term Goal Wt Readings from Last 3 Encounters:  07/29/21 192 lb 14.4 oz (87.5 kg)  07/26/21 191 lb 8 oz (86.9 kg)  07/24/21 193 lb 3.2 oz (87.6 kg)  Discussed current plan for CHF management.  Provided information regarding recommended weight parameters. Encouraged to weigh daily and record readings. Advised to weigh at least once a week if unable to weigh daily. Encouraged to notify provider for weight gain greater than 3 lbs overnight or weight gain greater than 5 lbs within a week.  Discussed s/sx of fluid overload and indications for notifying a provider. Encouraged to assess symptoms daily and contact clinic with concerns as needed. Discussed nutritional  intake. Advised to closely monitor sodium consumption and avoid highly processed foods when possible. Reviewed worsening s/sx related to CHF exacerbation and indications for seeking immediate medical attention.   Diabetes Interventions:  (Status:  New goal.) Long Term Goal Assessed patient's understanding of A1c goal: <7% Lab Results  Component Value Date   HGBA1C 10.0 (A) 06/22/2021  Reviewed medications and plan for diabetes management. Reviewed blood glucose readings and importance of consistent monitoring. Advised to monitor daily and maintain a log. Reviewed s/sx of hypoglycemia and hyperglycemia along with recommended interventions. Recalls several elevated morning readings in the 200's. Denies experiencing symptoms. Unable to recall if levels were checked before or after morning meal. Agreed to monitor fasting levels and record readings.  Discussed nutritional intake and importance of complying with an ADA/modified carb diet. Confirmed speaking with a [employer] to address needs regarding meals and nutrition assistance. Discussed and offered referrals for available Diabetes education classes. Declined need for additional diabetes education resources. Her A1C is not at goal. Will follow up with the team if she requires additional assistance r/t nutrition. Discussed importance of completing recommended DM preventive care. Recalls completing a foot exam. Reports completing daily foot care as advised. She is due for an annual Eye Exam. Will assist with arranging an appointment at [facility].    Falls Interventions:  (Status:  New goal.) Long Term Goal Provided information regarding safety and fall prevention measures. Reports using a walker or wheelchair as advised. Reviewed medications and discussed potential side effects. Advised patient of importance of notifying provider of falls. Assessed for falls since last  encounter. Discussed ability to perform ADL's and tasks  in the home. Assessed social determinant of health barriers. Reports performing ADL's independently. Reports family members are currently available to assist as needed. Agreed to update the care management team if her functional status declines.   Hyperlipidemia Interventions:  (Status:  New goal.) Long Term Goal Lab Results  Component Value Date   CHOL 113 06/22/2021   HDL 33 (L) 06/22/2021   LDLCALC 60 06/22/2021   TRIG 110 06/22/2021   CHOLHDL 3.4 06/22/2021    Medications reviewed. Reports taking medications as prescribed and tolerating regimen. Reviewed provider established cholesterol goals. Discussed importance of regular laboratory monitoring as prescribed Reviewed role and benefits of statin for ASCVD risk reduction Discussed strategies to manage statin-induced myalgias Reviewed importance of limiting foods high in cholesterol   Hypertension Interventions:  (Status:  New goal.) Long Term Goal Last practice recorded BP readings:  BP Readings from Last 3 Encounters:  07/29/21 103/72  07/26/21 96/65  07/24/21 105/68  Most recent eGFR/CrCl:  Lab Results  Component Value Date   EGFR 38 (L) 06/22/2021    No components found for: CRCL  Reviewed medications plan for hypertension management. Provided information regarding established blood pressure parameters along with indications for notifying a provider. Advised to monitor and record readings.  Discussed compliance with recommended cardiac prudent diet. Encouraged to read nutrition labels and avoid highly processed foods when possible. Discussed options regarding nutritional assistance and prepared meals. Confirmed speaking with a [employer]. Will follow up if additional assistance is required. Reviewed s/sx of heart attack, stroke and worsening symptoms that require immediate medical attention.   Patient Goals/Self-Care Activities: Take all medications as prescribed Attend all scheduled provider  appointments Call pharmacy for medication refills 3-7 days in advance of running out of medications Perform all self care activities independently  Call provider office for new concerns or questions    Follow Up Plan:   Will follow up to address updates regarding transportation needs within the next week.       Consent to CCM Services: Ms. Ranieri was given information about Chronic Care Management services including:  CCM service includes personalized support from designated clinical staff supervised by her physician, including individualized plan of care and coordination with other care providers 24/7 contact phone numbers for assistance for urgent and routine care needs. Service will only be billed when office clinical staff spend 20 minutes or more in a month to coordinate care. Only one practitioner may furnish and bill the service in a calendar month. The patient may stop CCM services at any time (effective at the end of the month) by phone call to the office staff. The patient will be responsible for cost sharing (co-pay) of up to 20% of the service fee (after annual deductible is met).  Patient agreed to services and verbal consent obtained.   The patient verbalized understanding of instructions, educational materials, and care plan provided today and declined offer to receive copy of patient instructions, educational materials, and care plan.   A member of the care management team will follow up within the next week to address concerns regarding transportation for pending medical appointments.   [facility]lander [facility] Care Management [facility] [phone]

## 2021-08-03 LAB — COOXEMETRY PANEL
Carboxyhemoglobin: 1 % (ref 0.5–1.5)
Methemoglobin: 1 % (ref 0.0–1.5)

## 2021-08-05 ENCOUNTER — Ambulatory Visit: Payer: Medicare Other

## 2021-08-05 ENCOUNTER — Encounter: Payer: Self-pay | Admitting: Nurse Practitioner

## 2021-08-05 ENCOUNTER — Telehealth: Payer: Self-pay | Admitting: Family Medicine

## 2021-08-05 ENCOUNTER — Ambulatory Visit (INDEPENDENT_AMBULATORY_CARE_PROVIDER_SITE_OTHER): Payer: Medicare Other | Admitting: Nurse Practitioner

## 2021-08-05 ENCOUNTER — Other Ambulatory Visit: Payer: Self-pay

## 2021-08-05 VITALS — BP 118/70 | HR 77 | Ht 62.0 in | Wt 195.0 lb

## 2021-08-05 DIAGNOSIS — E785 Hyperlipidemia, unspecified: Secondary | ICD-10-CM | POA: Diagnosis not present

## 2021-08-05 DIAGNOSIS — Z794 Long term (current) use of insulin: Secondary | ICD-10-CM

## 2021-08-05 DIAGNOSIS — I5022 Chronic systolic (congestive) heart failure: Secondary | ICD-10-CM | POA: Diagnosis not present

## 2021-08-05 DIAGNOSIS — I4819 Other persistent atrial fibrillation: Secondary | ICD-10-CM

## 2021-08-05 DIAGNOSIS — Z79899 Other long term (current) drug therapy: Secondary | ICD-10-CM

## 2021-08-05 DIAGNOSIS — E1159 Type 2 diabetes mellitus with other circulatory complications: Secondary | ICD-10-CM

## 2021-08-05 DIAGNOSIS — I2510 Atherosclerotic heart disease of native coronary artery without angina pectoris: Secondary | ICD-10-CM | POA: Diagnosis not present

## 2021-08-05 MED ORDER — SACUBITRIL-VALSARTAN 24-26 MG PO TABS: 1.0000 | ORAL_TABLET | Freq: Two times a day (BID) | ORAL | 6 refills | Status: DC

## 2021-08-05 MED ORDER — AMIODARONE HCL 200 MG PO TABS: 200.0000 mg | ORAL_TABLET | Freq: Every day | ORAL | 0 refills | Status: DC

## 2021-08-05 NOTE — Patient Instructions (Addendum)
Medication Instructions:  - Your physician has recommended you make the following change in your medication:   1) STOP Losartan  2) START Entresto 24/26 mg: - take 1 tablet by mouth TWICE daily   3) DECREASE Amiodarone 200 mg: - take 1 tablet by mouth ONCE daily   Samples Given: Entresto 24/26 mg Lot: [id number] Exp: 08/2023 # 1 bottle  *If you need a refill on your cardiac medications before your next appointment, please call your pharmacy*   Lab Work: - Your physician recommends that you return for lab work in: 1 week- BMP  If you have labs (blood work) drawn today and your tests are completely normal, you will receive your results only by: [place] (if you have [place]) OR A paper copy in the mail If you have any lab test that is abnormal or we need to change your treatment, we will call you to review the results.   Testing/Procedures: - none ordered   Follow-Up: At [facility], you and your health needs are our priority.  As part of our continuing mission to provide you with exceptional heart care, we have created designated Provider Care Teams.  These Care Teams include your primary Cardiologist (physician) and Advanced Practice Providers (APPs -  Physician Assistants and Nurse Practitioners) who all work together to provide you with the care you need, when you need it.  We recommend signing up for the patient portal called "MyChart".  Sign up information is provided on this After Visit Summary.  MyChart is used to connect with patients for Virtual Visits (Telemedicine).  Patients are able to view lab/test results, encounter notes, upcoming appointments, etc.  Non-urgent messages can be sent to your provider as well.   To learn more about what you can do with MyChart, go to [email or web address].    Your next appointment:   6 week(s)  The format for your next appointment:   In Person  Provider:   You may see Ida Rogue, MD or one of the following Advanced  Practice Providers on your designated Care Team:   Murray Hodgkins, NP    Other Instructions  ENTRESTO (Sacubitril; Valsartan)  Oral Tablets What is this medication? SACUBITRIL; VALSARTAN (sak UE bi tril; val SAR tan) is a combination of a neprilysin inhibitor and a an angiotensin II receptor blocker. It treats heart failure. This medicine may be used for other purposes; ask your health care provider or pharmacist if you have questions. COMMON BRAND NAME(S): Entresto What should I tell my care team before I take this medication? They need to know if you have any of these conditions: diabetes and take a medicine that contains aliskiren high levels of potassium in the blood kidney disease liver disease low blood pressure an unusual or allergic reaction to sacubitril; valsartan, drugs called angiotensin converting enzyme (ACE) inhibitors, angiotensin II receptor blockers (ARBs), other medicines, foods, dyes, or preservatives pregnant or trying to get pregnant breast-feeding How should I use this medication? Take this medicine by mouth. Take it as directed on the prescription label at the same time every day. You can take it with or without food. If it upsets your stomach, take it with food. Keep taking it unless your health care provider tells you to stop. Talk to your health care provider about the use of this drug in children. While it may be prescribed for children as young as 1 for selected conditions, precautions do apply. Overdosage: If you think you have taken too much of this  medicine contact a poison control center or emergency room at once. NOTE: This medicine is only for you. Do not share this medicine with others. What if I miss a dose? If you miss a dose, take it as soon as you can. If it is almost time for your next dose, take only that dose. Do not take double or extra doses. What may interact with this medication? Do not take this medicine with any of the following  medicines: aliskiren if you have diabetes angiotensin-converting enzyme (ACE) inhibitors, like benazepril, captopril, enalapril, fosinopril, lisinopril, or ramipril tranylcypromine This medicine may also interact with the following medicines: angiotensin II receptor blockers (ARBs) like azilsartan, candesartan, eprosartan, irbesartan, losartan, olmesartan, telmisartan, or valsartan celecoxib lithium NSAIDS, medicines for pain and inflammation, like ibuprofen or naproxen potassium-sparing diuretics like amiloride, spironolactone, and triamterene potassium supplements This list may not describe all possible interactions. Give your health care provider a list of all the medicines, herbs, non-prescription drugs, or dietary supplements you use. Also tell them if you smoke, drink alcohol, or use illegal drugs. Some items may interact with your medicine. What should I watch for while using this medication? Tell your doctor or health care provider if your symptoms do not start to get better or if they get worse. Do not become pregnant while taking this medicine. Women should inform their health care provider if they wish to become pregnant or think they might be pregnant. There is a potential for serious harm to an unborn child. Talk to your health care provider for more information. You may get drowsy or dizzy. Do not drive, use machinery, or do anything that needs mental alertness until you know how this medicine affects you. Do not stand or sit up quickly, especially if you are an older patient. This reduces the risk of dizzy or fainting spells. Alcohol may interfere with the effects of this medicine. Avoid alcoholic drinks. Avoid salt substitutes unless you are told otherwise by your health care provider. What side effects may I notice from receiving this medication? Side effects that you should report to your doctor or health care provider as soon as possible: allergic reactions (skin rash, itching  or hives; swelling of the face, lips, or tongue) high potassium levels (chest pain; fast, irregular heartbeat; muscle weakness) kidney injury (trouble passing urine or change in the amount of urine) low blood pressure (dizziness; feeling faint or lightheaded, falls; unusually weak or tired) Side effects that usually do not require medical attention (report to your doctor or health care provider if they continue or are bothersome): cough This list may not describe all possible side effects. Call your doctor for medical advice about side effects. You may report side effects to FDA at 1-800-FDA-1088. Where should I keep my medication? Keep out of the reach of children and pets. Store at room temperature between 20 and 25 degrees C (68 and 77 degrees F). Protect from moisture. Keep the container tightly closed. Get rid of any unused medicine after the expiration date. To get rid of medicines that are no longer needed or have expired: Take the medicine to a take-back program. Check with your pharmacy or law enforcement to find a location. If you cannot return the medicine, check the label or package insert to see if the medicine should be thrown out in the garbage or flushed down the toilet. If you are not sure, ask your health care provider. If it is safe to put it in the trash, empty the medicine  out of the container. Mix the medicine with cat litter, dirt, coffee grounds, or other unwanted substance. Seal the mixture in a bag or container. Put it in the trash. NOTE: This sheet is a summary. It may not cover all possible information. If you have questions about this medicine, talk to your doctor, pharmacist, or health care provider.  2022 Elsevier/Gold Standard (2021-03-15 00:00:00)

## 2021-08-05 NOTE — Chronic Care Management (AMB) (Signed)
Chronic Care Management   CCM RN Visit Note  08/05/2021 Name: Michele [name] MRN: [id number] DOB: 06/25/1956  Subjective: Michele [name] is a 66 y.o. year old female who is a primary care patient of Jerrol Banana., MD. The care management team was consulted for assistance with disease management and care coordination needs.    Engaged with patient by telephone for follow up visit in response to provider referral for case management and care coordination services.   Consent to Services:  The patient was given information about Chronic Care Management services, agreed to services, and gave verbal consent prior to initiation of services.  Please see initial visit note for detailed documentation.    Assessment: Review of patient past medical history, allergies, medications, health status, including review of consultants reports, laboratory and other test data, was performed as part of comprehensive evaluation and provision of chronic care management services.   SDOH (Social Determinants of Health) assessments and interventions performed: No  CCM Care Plan  No Known Allergies  Outpatient Encounter Medications as of 08/05/2021  Medication Sig Note   amiodarone (PACERONE) 200 MG tablet Take 1 tablet (200 mg total) by mouth daily.    apixaban (ELIQUIS) 5 MG TABS tablet Take 1 tablet (5 mg total) by mouth 2 (two) times daily.    Blood Glucose Monitoring Suppl (ONE TOUCH ULTRA SYSTEM KIT) w/Device KIT Check sugar once daily. DX E11.9    cetirizine (ZYRTEC) 10 MG tablet Take 1 tablet (10 mg total) by mouth daily. 08/02/2021: Reports taking only as needed   clotrimazole-betamethasone (LOTRISONE) cream Apply 1 application topically 2 (two) times daily.    fluticasone (FLONASE) 50 MCG/ACT nasal spray Place into the nose. 08/02/2021: Reports taking only as needed.   Glucagon (GVOKE HYPOPEN 1-PACK) 1 MG/0.2ML SOAJ Inject 1 mg into the skin every 15 (fifteen) minutes as needed for up to 5 doses.     glucose blood (ONE TOUCH ULTRA TEST) test strip Use as instructed    insulin glargine (LANTUS SOLOSTAR) 100 UNIT/ML Solostar Pen Inject 15 Units into the skin 2 (two) times daily.    Insulin Pen Needle 32G X 4 MM MISC Inject twice daily, SQ. DX E11.9    Lancets (ONETOUCH ULTRASOFT) lancets Check sugar once daily DX E11.9    levothyroxine (SYNTHROID) 200 MCG tablet TAKE 1 TABLET(200 MCG) BY MOUTH DAILY BEFORE BREAKFAST    metoprolol succinate (TOPROL-XL) 25 MG 24 hr tablet Take 0.5 tablets (12.5 mg total) by mouth daily.    NON FORMULARY CPAP AT BEDTIME    rosuvastatin (CRESTOR) 10 MG tablet TAKE 1 TABLET BY MOUTH  DAILY    sacubitril-valsartan (ENTRESTO) 24-26 MG Take 1 tablet by mouth 2 (two) times daily.    Syringe/Needle, Disp, (SYRINGE 3CC/25GX1") 25G X 1" 3 ML MISC 1 mL by Does not apply route every 30 (thirty) days.    torsemide (DEMADEX) 20 MG tablet Take 40 mg by mouth 2 (two) times daily.    No facility-administered encounter medications on file as of 08/05/2021.    Patient Active Problem List   Diagnosis Date Noted   Hospital discharge follow-up 07/29/2021   Coronary artery disease involving native heart    Acute systolic heart failure ([place])    Cardiogenic shock ([facility])    Pressure injury of skin 07/14/2021   Dilated cardiomyopathy ([place]) 07/14/2021   Dyspnea on exertion 07/13/2021   Atrial fibrillation ([place]) 07/13/2021   Atrial fibrillation with RVR ([place]) 07/13/2021   Fatigue due to excessive exertion  07/13/2021   Fissure in skin of foot 07/13/2021   Sinus congestion 07/13/2021   Acute respiratory failure with hypoxia ([place]) 07/13/2021   OSA (obstructive sleep apnea) 06/22/2021   CHF (congestive heart failure) ([place]) 06/22/2021   Neutrophilia 12/16/2019   PVC (premature ventricular contraction) 05/14/2018   Fat necrosis (segmental) of breast 12/30/2015   Allergic rhinitis 11/28/2014   Acid reflux 11/28/2014   Adult hypothyroidism 11/28/2014   DCIS (ductal carcinoma in  situ) 06/03/2014   Diabetes mellitus ([place]) 03/14/2012   Shortness of breath 12/13/2011   Atherosclerosis of native coronary artery of native heart with stable angina pectoris ([place]) 12/13/2011   Morbid obesity ([place]) 12/13/2011   Hyperlipidemia 12/13/2011   Hypertension 12/13/2011     Patient Care Plan: RN Care Management Plan of Care     Problem Identified: DM, HLD, HTN, CHF, A-Fib and Fall Risk      Long-Range Goal: Disease Progression Prevented or Minimized   Start Date: 08/02/2021  Expected End Date: 10/31/2021  Priority: High  Note:   Current Barriers:  Chronic Disease Management support and education needs related to CHF, HTN, HLD, DMII, A-Fib and Fall Risk.  RNCM Clinical Goal(s):  Patient will demonstrate improved adherence to prescribed treatment plan for CHF, HTN, HLD, DMII, A-Fib and Fall Risk through collaboration with the provider,  RN Care Manager and the care team.   Interventions: 1:1 collaboration with primary care provider regarding development and update of comprehensive plan of care as evidenced by provider attestation and co-signature Inter-disciplinary care team collaboration (see longitudinal plan of care) Evaluation of current treatment plan related to  self management and patient's adherence to plan as established by provider   Heart Failure Interventions:  (Status:  New goal.) Long Term Goal Wt Readings from Last 3 Encounters:  07/29/21 192 lb 14.4 oz (87.5 kg)  07/26/21 191 lb 8 oz (86.9 kg)  07/24/21 193 lb 3.2 oz (87.6 kg)  Discussed current plan for CHF management.  Provided information regarding recommended weight parameters. Encouraged to weigh daily and record readings. Advised to weigh at least once a week if unable to weigh daily. Encouraged to notify provider for weight gain greater than 3 lbs overnight or weight gain greater than 5 lbs within a week.  Discussed s/sx of fluid overload and indications for notifying a provider. Encouraged to  assess symptoms daily and contact clinic with concerns as needed. Discussed nutritional intake. Advised to closely monitor sodium consumption and avoid highly processed foods when possible. Reviewed worsening s/sx related to CHF exacerbation and indications for seeking immediate medical attention.   Diabetes Interventions:  (Status:  New goal.) Long Term Goal Assessed patient's understanding of A1c goal: <7% Lab Results  Component Value Date   HGBA1C 10.0 (A) 06/22/2021  Reviewed medications and plan for diabetes management. Reviewed blood glucose readings and importance of consistent monitoring. Advised to monitor daily and maintain a log. Reviewed s/sx of hypoglycemia and hyperglycemia along with recommended interventions. Recalls several elevated morning readings in the 200's. Denies experiencing symptoms. Unable to recall if levels were checked before or after morning meal. Agreed to monitor fasting levels and record readings.  Discussed nutritional intake and importance of complying with an ADA/modified carb diet. Confirmed speaking with a [employer] to address needs regarding meals and nutrition assistance. Discussed and offered referrals for available Diabetes education classes. Declined need for additional diabetes education resources. Her A1C is not at goal. Will follow up with the team if she requires additional  assistance r/t nutrition. Discussed importance of completing recommended DM preventive care. Recalls completing a foot exam. Reports completing daily foot care as advised. She is due for an annual Eye Exam. Will assist with arranging an appointment at [facility].     Falls Interventions:  (Status:  New goal.) Long Term Goal Provided information regarding safety and fall prevention measures. Reports using a walker or wheelchair as advised. Reviewed medications and discussed potential side effects. Advised patient of importance of notifying provider of  falls. Assessed for falls since last encounter. Discussed ability to perform ADL's and tasks in the home. Assessed social determinant of health barriers. Reports performing ADL's independently. Reports family members are currently available to assist as needed. Agreed to update the care management team if her functional status declines.   Hyperlipidemia Interventions:  (Status:  New goal.) Long Term Goal Lab Results  Component Value Date   CHOL 113 06/22/2021   HDL 33 (L) 06/22/2021   LDLCALC 60 06/22/2021   TRIG 110 06/22/2021   CHOLHDL 3.4 06/22/2021    Medications reviewed. Reports taking medications as prescribed and tolerating regimen. Reviewed provider established cholesterol goals. Discussed importance of regular laboratory monitoring as prescribed Reviewed role and benefits of statin for ASCVD risk reduction Discussed strategies to manage statin-induced myalgias Reviewed importance of limiting foods high in cholesterol   Hypertension Interventions:  (Status:  New goal.) Long Term Goal Last practice recorded BP readings:  BP Readings from Last 3 Encounters:  07/29/21 103/72  07/26/21 96/65  07/24/21 105/68  Most recent eGFR/CrCl:  Lab Results  Component Value Date   EGFR 38 (L) 06/22/2021    No components found for: CRCL  Reviewed medications plan for hypertension management. Provided information regarding established blood pressure parameters along with indications for notifying a provider. Advised to monitor and record readings.  Discussed compliance with recommended cardiac prudent diet. Encouraged to read nutrition labels and avoid highly processed foods when possible. Discussed options regarding nutritional assistance and prepared meals. Confirmed speaking with a [employer]. Will follow up if additional assistance is required. Reviewed s/sx of heart attack, stroke and worsening symptoms that require immediate medical attention.   Care  Coordination/Transportation: Completed follow up regarding care coordination needs related to transportation. Reports some of the appointments were rescheduled. She still requires assistance with two appointments next month. Reviewed new appointment dates. Referral submitted for transportation assistance. Patient anticipates her family being available to provide transportation for the remaining appointments. Agreed to contact the care management team if she requires additional transportation assistance.  Patient Goals/Self-Care Activities: Take all medications as prescribed Attend all scheduled provider appointments Call pharmacy for medication refills 3-7 days in advance of running out of medications Perform all self care activities independently  Call provider office for new concerns or questions    Follow Up Plan:   Will follow up next month.      PLAN: A member of the care management team will follow up next month.   [facility] Care Management [facility] [phone]

## 2021-08-05 NOTE — Telephone Encounter (Signed)
Michele [name] from [facility] called to report that they are missing face to face office notes and a copy of her diagnostic sleep study.  Best contact: [phone] Fax: [phone]

## 2021-08-05 NOTE — Progress Notes (Signed)
Office Visit    Patient Name: Michele [name] Date of Encounter: 08/05/2021  Primary Care Provider:  Jerrol Banana., MD Primary Cardiologist:  Ida Rogue, MD  Chief Complaint    66 y/o ? w/ a h/o nonobs CAD, HFrEF, NICM, DMII, HL and OSA, who presents for f/u after hospitalization for AFib and CHF s/p TEE/DCCV.  Past Medical History    Past Medical History:  Diagnosis Date   Abscess of breast, right 08/19/2012   Breast cancer ([place]) 07/13/2010   1.5 cm,intermediate grade DCIS, nuclear grade 2, ER 90%, PR 90% treated with wide excision, reexcision to negative margins and [facility] partial breast radiation.   Coronary artery disease    a. 2009 Cath: nonobs LAD dzs; b. 07/2021 Cath: LM nl, LAD 47m D1 mild dzs, D2 40, LCX nl, RCA 40p. PCWP 24. PA 37/26(30).   DCIS (ductal carcinoma in situ) of breast, right 2012   Diabetes mellitus ([place]    Type II   GERD (gastroesophageal reflux disease)    HFrEF (heart [place] with reduced [place] fraction) [place]    a. 2009 Echo: LV dysfxn; b. 2013 Echo: EF 50-55%; c. 07/2021 Echo: EF<20%, glob HK - in/inflat moves best. Nl PASP. Sev dil LA. Mildly dil RA. Mild-mod MR.   Hyperlipidemia    Hypertension    Ischemic cardiomyopathy    Obesity, unspecified    Persistent atrial fibrillation ([place]    a. 07/2021 s/p TEE and DCCV (150J)-->amio/eliquis (CHA2DS2VASc 6).   Personal history of radiation therapy    Sleep apnea    Thyroid disease    hypothyroidism   Umbilical hernia without mention of obstruction or gangrene    Past Surgical History:  Procedure Laterality Date   BREAST BIOPSY Right 2007   right bx neg   BREAST BIOPSY Right 08/10/2016   fat necrosis/ done in Dr. BDwyane Luooffice   BREAST EXCISIONAL BIOPSY Right 2011   DCIS mammosite lumpectomy   BREAST LUMPECTOMY Right Jan 2012   Wide excision   BREAST MASS EXCISION Right 2011   December   CARDIAC CATHETERIZATION  01/08/2008   CHOLECYSTECTOMY  2013   [facility]  HERNIA REPAIR  2013   epigastric   INCISION AND DRAINAGE BREAST ABSCESS Right 08/19/2012   RIGHT/LEFT HEART CATH AND CORONARY ANGIOGRAPHY N/A 07/15/2021   Procedure: RIGHT/LEFT HEART CATH AND CORONARY ANGIOGRAPHY;  Surgeon: AWellington Hampshire MD;  [facility];  Service: Cardiovascular;  Laterality: N/A;   TEE WITHOUT CARDIOVERSION N/A 07/19/2021   Procedure: TRANSESOPHAGEAL ECHOCARDIOGRAM (TEE);  Surgeon: GMinna Merritts MD;  Location: [facility] ORS;  Service: Cardiovascular;  Laterality: N/A;   TONSILLECTOMY AND ADENOIDECTOMY     age 66yrs    Allergies  No Known Allergies  History of Present Illness    66y/o ? w/ the above PMH including nonobs CAD, HFrEF, NICM, DMII, HL, OSA, and PAF.  She initially underwent cath in 12/2007 in the setting of LV dysfxn, and this showed nonobstructive LAD dzs.  Repeate echo in 2013, showed improved EF of 50-55%.  On 07/2021, she was seen by her PCP w/ progressive weakness and dyspnea w/ finding of AFib w/ RVR.  [facility].  CHF was noted on CTA chest.  PE could not be excluded.  She was hypotensive and required norepinephrine.  Echocardiogram on January 5 showed EF of less  than 20% with global hypokinesis.  The inferior and inferolateral walls were best preserved.  The right ventricular function was moderately reduced with small pericardial effusion, and mild to moderate mitral regurgitation.  She underwent right and left heart cardiac catheterization on January 6 showing moderately elevated filling pressures and severely reduced cardiac output (2.19) and index (1.13).  She has 70% stenosis in the mid LAD, which was not felt to be flow-limiting and continued IV diuresis with initiation of milrinone was recommended, as well as TEE cardioversion.  Dyspnea improved with diuresis and she was eventually able to be weaned off of norepinephrine.  TEE and cardioversion was  successfully performed on January 10, with restoration of sinus rhythm.  Amiodarone therapy was initiated.  Milrinone was discontinued on January 11, and beta-blocker and ARB therapy were added.  Following adequate diuresis, she was transitioned to oral torsemide and was able to be discharged home on January 15 at 193 pounds.  In heart failure clinic follow-up on January 17, torsemide dose was reduced to 40 mg daily due to soft blood pressure and orthostasis.  Since her heart failure clinic follow-up, she has felt well.  Her weight has been stable on her home scale at approximately 190 pounds.  She has not had any significant lower extremity edema and dyspnea continues to improve.  She is working with physical therapy the legs remain weak.  She has had no recurrence of orthostatic lightheadedness.  She is tolerating medications well and notes good appetite.  She denies chest pain, palpitation, PND, orthopnea, dizziness, syncope, edema, or early satiety.  Home Medications    Current Outpatient Medications  Medication Sig Dispense Refill   apixaban (ELIQUIS) 5 MG TABS tablet Take 1 tablet (5 mg total) by mouth 2 (two) times daily. 60 tablet 0   Blood Glucose Monitoring Suppl (ONE TOUCH ULTRA SYSTEM KIT) w/Device KIT Check sugar once daily. DX E11.9 1 each 0   cetirizine (ZYRTEC) 10 MG tablet Take 1 tablet (10 mg total) by mouth daily. 30 tablet 11   clotrimazole-betamethasone (LOTRISONE) cream Apply 1 application topically 2 (two) times daily. 60 g 2   fluticasone (FLONASE) 50 MCG/ACT nasal spray Place into the nose.     Glucagon (GVOKE HYPOPEN 1-PACK) 1 MG/0.2ML SOAJ Inject 1 mg into the skin every 15 (fifteen) minutes as needed for up to 5 doses. 1 mL 3   glucose blood (ONE TOUCH ULTRA TEST) test strip Use as instructed 100 each 3   insulin glargine (LANTUS SOLOSTAR) 100 UNIT/ML Solostar Pen Inject 15 Units into the skin 2 (two) times daily. 15 mL 0   Insulin Pen Needle 32G X 4 MM MISC Inject twice  daily, SQ. DX E11.9 200 each 3   Lancets (ONETOUCH ULTRASOFT) lancets Check sugar once daily DX E11.9 100 each 3   levothyroxine (SYNTHROID) 200 MCG tablet TAKE 1 TABLET(200 MCG) BY MOUTH DAILY BEFORE BREAKFAST 90 tablet 0   metoprolol succinate (TOPROL-XL) 25 MG 24 hr tablet Take 0.5 tablets (12.5 mg total) by mouth daily. 30 tablet 0   NON FORMULARY CPAP AT BEDTIME     rosuvastatin (CRESTOR) 10 MG tablet TAKE 1 TABLET BY MOUTH  DAILY 90 tablet 1   sacubitril-valsartan (ENTRESTO) 24-26 MG Take 1 tablet by mouth 2 (two) times daily. 60 tablet 6   Syringe/Needle, Disp, (SYRINGE 3CC/25GX1") 25G X 1" 3 ML MISC 1 mL by Does not apply route every 30 (thirty) days. 12 each 1   torsemide (DEMADEX) 20 MG tablet  Take 40 mg by mouth 2 (two) times daily.     amiodarone (PACERONE) 200 MG tablet Take 1 tablet (200 mg total) by mouth daily. 60 tablet 0   No current facility-administered medications for this visit.     Review of Systems    Strength continues to improve with physical therapy.  Still some dyspnea but overall improving.  She denies chest pain, palpitations, PND, orthopnea, dizziness, syncope, edema, or early satiety.  All other systems reviewed and are otherwise negative except as noted above.    Physical Exam    VS:  BP 118/70 (BP Location: Right Arm, Patient Position: Sitting, Cuff Size: Normal)    Pulse 77    Ht _0  (1.575 m)    Wt 195 lb (88.5 kg)    SpO2 97%    BMI 35.67 kg/m  , BMI Body mass index is 35.67 kg/m.     GEN: Well nourished, well developed, in no acute distress. HEENT: normal. Neck: Supple, no JVD, carotid bruits, or masses. Cardiac: RRR, no murmurs, rubs, or gallops. No clubbing, cyanosis, trace bilateral ankle edema.  Radials/PT 2+ and equal bilaterally.  Respiratory:  Respirations regular and unlabored, clear to auscultation bilaterally. GI: Soft, nontender, nondistended, BS + x 4. MS: no deformity or atrophy. Skin: warm and dry, no rash. Neuro:  Strength and  sensation are intact. Psych: Normal affect.  Accessory Clinical Findings    ECG personally reviewed by me today -sinus rhythm, 77, first-degree AV block, left axis deviation, IVCD- no acute changes.  Lab Results  Component Value Date   WBC 12.8 (H) 07/23/2021   HGB 15.4 (H) 07/23/2021   HCT 46.6 (H) 07/23/2021   MCV 96.1 07/23/2021   PLT 117 (L) 07/23/2021   Lab Results  Component Value Date   CREATININE 1.35 (H) 07/26/2021   BUN 65 (H) 07/26/2021   NA 139 07/26/2021   K 3.6 07/26/2021   CL 93 (L) 07/26/2021   CO2 33 (H) 07/26/2021   Lab Results  Component Value Date   ALT 119 (H) 07/19/2021   AST 84 (H) 07/19/2021   ALKPHOS 131 (H) 07/19/2021   BILITOT 0.9 07/19/2021   Lab Results  Component Value Date   CHOL 113 06/22/2021   HDL 33 (L) 06/22/2021   LDLCALC 60 06/22/2021   TRIG 110 06/22/2021   CHOLHDL 3.4 06/22/2021    Lab Results  Component Value Date   HGBA1C 10.0 (A) 06/22/2021    Assessment & Plan    1.  Chronic heart failure with reduced ejection fraction/nonischemic cardiomyopathy: Recently admitted with rapid atrial fibrillation complicated by low output heart failure and shock requiring vasopressors and milrinone.  Diagnostic catheterization with 70% mid LAD stenosis, which was not felt to be significant.  Since discharge, she has done well and her weight has been stable in the 190 pound range on her home scale.  She has trace lower extremity swelling today and has been tolerating beta-blocker, ARB, and once daily torsemide.  Blood pressure is 118/70 today and she has had similar readings at home.  I will take this opportunity to transition her from losartan to low-dose Delene Loll, which she will begin tomorrow.  We will plan on follow-up basic metabolic panel next week and potentially, we can reduce her torsemide to 20 mg once a day.  Since I am transitioning to [facility], I will defer adding an SGLT2 inhibitor or MRA at this time.  She does have follow-up in  heart failure clinic in  about 3 weeks, and consideration can be given toward these medications at that time.  She is maintaining sinus rhythm, and we will plan to follow-up an echocardiogram in approximately 8 weeks to reevaluate LV function in sinus rhythm and on maximally tolerated medical therapy.  2.  Persistent atrial fibrillation: Status post TEE and cardioversion during recent admission as outlined above.  She is now on amiodarone, currently 200 mg twice daily.  She is in sinus rhythm today.  I am going to reduce her amiodarone to 2 mg once daily.  She has tolerated this well so far.  Continue beta-blocker and Eliquis.  We will plan follow-up LFTs and TSH at next visit in approximately 6 weeks.  We will also need to refer for PFTs-we will await until her respiratory status is improved/continue to work with physical therapy.  3.  Nonobstructive CAD: Moderate nonobstructive CAD on recent catheterization with 70% lesion in the mid LAD.  This was not felt to be significant or a culprit for her presentation.  She has not been experiencing any chest pain.  Dyspnea continues to improve.  She remains on beta-blocker and statin therapy.  No aspirin in the setting of Eliquis.  4.  Hyperlipidemia: LDL of 60 in December 2022.  LFTs were up during hospitalization with cardiogenic shock.  We will plan to follow-up with blood work next week.  Continue statin therapy.  5.  Disposition: Follow-up complete metabolic panel next week.  She has follow-up in heart failure clinic on 17 February and we will see her back here in approximately 6 weeks.   Murray Hodgkins, NP 08/05/2021, 1:02 PM

## 2021-08-08 NOTE — Telephone Encounter (Signed)
For Cpap supplies ordered its is okay to send as it was requested when order was originally faxed. , I do not see scanned sleep study, not do I see it in health archive. Left voice message for patient to call back for clarification. If patient returns call can you please find out what facility she went to, to have her sleep study so I make request medical records? KW

## 2021-08-08 NOTE — Telephone Encounter (Signed)
Patient contact office back and states that she had sleep study through [facility]. Will fax medical record request to Sleep [facility] today to get records of study. KW

## 2021-08-08 NOTE — Telephone Encounter (Signed)
Record request was sent to [facility].KW

## 2021-08-09 ENCOUNTER — Ambulatory Visit: Payer: Self-pay | Admitting: *Deleted

## 2021-08-09 ENCOUNTER — Telehealth: Payer: Self-pay | Admitting: Nurse Practitioner

## 2021-08-09 DIAGNOSIS — E1159 Type 2 diabetes mellitus with other circulatory complications: Secondary | ICD-10-CM

## 2021-08-09 DIAGNOSIS — I110 Hypertensive heart disease with heart failure: Secondary | ICD-10-CM | POA: Diagnosis not present

## 2021-08-09 DIAGNOSIS — I509 Heart failure, unspecified: Secondary | ICD-10-CM

## 2021-08-09 DIAGNOSIS — Z794 Long term (current) use of insulin: Secondary | ICD-10-CM

## 2021-08-09 DIAGNOSIS — E785 Hyperlipidemia, unspecified: Secondary | ICD-10-CM

## 2021-08-09 NOTE — Telephone Encounter (Signed)
Called and spoke with pt.  Pt seen in office Friday 1/27 with Ignacia Bayley, NP.  At ov pt was transitioned from losartan 12.5 mg qd to entresto 24-26 mg bid.   Confirmed that pt has stopped losartan.  She started entresto Saturday.  Friday BP 114/78 prior to starting entresto - this BP is similar to BP at ov as well as her usual BP when she checks at home. Yesterday, Sunday pt felt light headed - BP 94/74 This morning BP 85/73.  Pt had PT this morning and only did minimal activity d/t low BP but tolerated well.  Pt rechecked BP while on the phone with this call and BP now 99/80. Pt denies dizziness, sob at this time. States "I feel fine today."  Notified pt I will send message to Ignacia Bayley for further recc d/t low BP after med change.  Advised pt if I have not called back today to hold second dose of entresto this evening.  Continue to monitor BP and symptoms and let us know of any further changes.  Pt voiced understanding and no further questions at this time.

## 2021-08-09 NOTE — Telephone Encounter (Signed)
Thank you for the feedback on blood pressures and symptoms.  If she is feeling lightheaded on Entresto, I think we will have to transition back to low-dose losartan.

## 2021-08-09 NOTE — Telephone Encounter (Signed)
°  Chief Complaint: BP low Symptoms: BP 85/60  HR 85. One brief episode of lightheadedness with P.T. this afternoon. BS higher than usual 250-300 Frequency: BP trending down since Saturday when cardiologist started on Entresto Pertinent Negatives: Patient denies any further dizziness, no visual changes. Disposition: [] ED /[] Urgent Care (no appt availability in office) / [] Appointment(In office/virtual)/ []  [place] [place] [facility]/ [] Home Care/ [] Refused Recommended Disposition /[] [place] [facility]/ [x]  Follow-up with PCP Additional Notes: P.T. states they will call cardiologist who prescribed new med Entresto. Pt started med 08/06/21, trending down since.  Assured NT would route to practice for PCPs review as well.   Reason for Disposition  [9] Fall in systolic BP > 20 mm Hg from normal AND [2] NOT dizzy, lightheaded, or weak  Answer Assessment - Initial Assessment Questions 1. BLOOD PRESSURE: "What is the blood pressure?" "Did you take at least two measurements 5 minutes apart?"     86/60 2. ONSET: "When did you take your blood pressure?"     This afternoon, PT Michele [name] 3. HOW: "How did you obtain the blood pressure?" (e.g., visiting nurse, automatic home BP monitor)     P.T. 4. HISTORY: "Do you have a history of low blood pressure?" "What is your blood pressure normally?"     No 5. MEDICATIONS: "Are you taking any medications for blood pressure?" If Yes, ask: "Have they been changed recently?"     Started Entresto 08/06/21 6. PULSE RATE: "Do you know what your pulse rate is?"      85 7. OTHER SYMPTOMS: "Have you been sick recently?" "Have you had a recent injury?"     One brief episode of lightheadedness when turing head, with P.T  Protocols used: Blood Pressure - Low-A-AH

## 2021-08-09 NOTE — Telephone Encounter (Signed)
Pt c/o BP issue: STAT if pt c/o blurred vision, one-sided weakness or slurred speech  1. What are your last 5 BP readings?  Today this morning 85/73 Yesterday 11/55 Friday 114/78  2. Are you having any other symptoms (ex. Dizziness, headache, blurred vision, passed out)? Some lightheadness last night  3. What is your BP issue? Too low  States that her medication was changed at her last visit.

## 2021-08-09 NOTE — Telephone Encounter (Signed)
Patient has been advised and states that she is awaiting response from cardiology. KW

## 2021-08-10 ENCOUNTER — Other Ambulatory Visit: Payer: Self-pay

## 2021-08-10 ENCOUNTER — Other Ambulatory Visit (INDEPENDENT_AMBULATORY_CARE_PROVIDER_SITE_OTHER): Payer: Medicare Other

## 2021-08-10 DIAGNOSIS — Z79899 Other long term (current) drug therapy: Secondary | ICD-10-CM

## 2021-08-10 DIAGNOSIS — I5022 Chronic systolic (congestive) heart failure: Secondary | ICD-10-CM | POA: Diagnosis not present

## 2021-08-10 NOTE — Telephone Encounter (Signed)
Reviewed recommendations from provider. She states that after thinking about it she was fixing something in the microwave and turned around abruptly and feels like that could have been the cause because she had no lightheadedness until that event. So she would like to continue monitoring her blood pressures and if they remain low or if symptoms return she will give [place] a call. Will update provider and patient will call back if further recommendations are needed. She was appreciative for the call with no further questions at this time.

## 2021-08-11 ENCOUNTER — Telehealth: Payer: Self-pay | Admitting: *Deleted

## 2021-08-11 ENCOUNTER — Telehealth: Payer: Self-pay | Admitting: Family Medicine

## 2021-08-11 DIAGNOSIS — I5022 Chronic systolic (congestive) heart failure: Secondary | ICD-10-CM

## 2021-08-11 DIAGNOSIS — I2510 Atherosclerotic heart disease of native coronary artery without angina pectoris: Secondary | ICD-10-CM

## 2021-08-11 DIAGNOSIS — I4819 Other persistent atrial fibrillation: Secondary | ICD-10-CM

## 2021-08-11 LAB — BASIC METABOLIC PANEL
BUN/Creatinine Ratio: 25 (ref 12–28)
BUN: 36 mg/dL — ABNORMAL HIGH (ref 8–27)
CO2: 26 mmol/L (ref 20–29)
Calcium: 9.8 mg/dL (ref 8.7–10.3)
Chloride: 95 mmol/L — ABNORMAL LOW (ref 96–106)
Creatinine, Ser: 1.44 mg/dL — ABNORMAL HIGH (ref 0.57–1.00)
Glucose: 279 mg/dL — ABNORMAL HIGH (ref 70–99)
Potassium: 4.2 mmol/L (ref 3.5–5.2)
Sodium: 137 mmol/L (ref 134–144)
eGFR: 40 mL/min/{1.73_m2} — ABNORMAL LOW (ref >59)

## 2021-08-11 MED ORDER — TORSEMIDE 20 MG PO TABS: 20.0000 mg | ORAL_TABLET | Freq: Every day | ORAL | 3 refills | Status: DC

## 2021-08-11 MED ORDER — TORSEMIDE 20 MG PO TABS: 40.0000 mg | ORAL_TABLET | Freq: Every day | ORAL | 3 refills | Status: DC

## 2021-08-11 NOTE — Telephone Encounter (Signed)
-----   Message from Theora Gianotti, NP sent at 08/11/2021 11:01 AM EST ----- Since starting entresto, creatinine has risen slightly.  I'd like to reduce her torsemide to 40mg  once daily, instead of twice daily  This is likely to help with any lightheadedness that she was having.  She can have f/u bmet @ CHF clinic f/u on 2/17.  Of course, if wt begins to rise on reduced dose torsemide, I'd like for her to notify us.

## 2021-08-11 NOTE — Telephone Encounter (Signed)
Please advise 

## 2021-08-11 NOTE — Telephone Encounter (Signed)
Patient is calling back, states she failed to write down the medication changes. Please call to discuss.

## 2021-08-11 NOTE — Addendum Note (Signed)
Addended by: Valora Corporal on: 08/11/2021 03:05 PM   Modules accepted: Orders

## 2021-08-11 NOTE — Telephone Encounter (Signed)
Reviewed results and recommendations with patient. Advised that we would like repeat labs when she has her appointment with CHF clinic. She verbalized understanding of results, recommendations, and had no further questions at this time.

## 2021-08-11 NOTE — Telephone Encounter (Signed)
Copied from [place] [id number]. Topic: Quick Communication - Home Health Verbal Orders >> Aug 11, 2021  2:38 PM Yvette Rack wrote: Caller/Agency: Tami Lin with [facility] Callback Number: [phone] Requesting OT/PT/Skilled Nursing/Social Work/Speech Therapy: OT Frequency: 1 x 4 weeks

## 2021-08-11 NOTE — Telephone Encounter (Signed)
Spoke with patient and reviewed medication recommendations. Patient states that she has only been taking it once a day since she saw Forestine Chute NP on the 17th.   Verbally discussed with Ignacia Bayley NP and he instructed she cut it back to one tablet once a day.   Reviewed this information and instructions with patient. She verbalized understanding with no further questions at this time.   Orders as follows: DECREASE Torsemide 20 mg to 1 tablet once a day BMET to be done at upcoming appointment with Forestine Chute NP on 08/26/21 Patient advised to call back if her weight increases on the lower dose.

## 2021-08-12 NOTE — Telephone Encounter (Signed)
Left detailed message on Stephanie's vm, giving verbal okay.

## 2021-08-15 ENCOUNTER — Other Ambulatory Visit: Payer: Medicare Other

## 2021-08-15 ENCOUNTER — Telehealth: Payer: Self-pay | Admitting: Nurse Practitioner

## 2021-08-15 ENCOUNTER — Telehealth: Payer: Self-pay | Admitting: Family Medicine

## 2021-08-15 MED ORDER — LOSARTAN POTASSIUM 25 MG PO TABS: 12.5000 mg | ORAL_TABLET | Freq: Every day | ORAL | Status: DC

## 2021-08-15 NOTE — Telephone Encounter (Signed)
I'm sorry to hear that she's still not tolerating entresto.  As I prev rec on 1/31, when this first cropped up, we'll have to go back to low-dose losartan.  I recommend holding entresto tonight and all day tomorrow and then resuming losartan 12.5mg  daily on Wednesday.

## 2021-08-15 NOTE — Telephone Encounter (Signed)
Pt c/o BP issue: STAT if pt c/o blurred vision, one-sided weakness or slurred speech  1. What are your last 5 BP readings?   Sitting 94/60 After ambulation 88/64  Standing 80/50    2. Are you having any other symptoms (ex. Dizziness, headache, blurred vision, passed out)? Dizziness   3. What is your BP issue? Low after recent med change entresto new to patient and bp trending low

## 2021-08-15 NOTE — Telephone Encounter (Signed)
Nevin Bloodgood returned call. She is aware of plan noted below.

## 2021-08-15 NOTE — Telephone Encounter (Signed)
Please advise 

## 2021-08-15 NOTE — Telephone Encounter (Signed)
[name] with home health, left detailed vm with recc below.   Called pt and notified of recc below as well.  Pt voiced understanding. Pt will:  Hold entresto tonight and all day tomorrow. Pt will resume losartan 12.5 mg daily on Wednesday.   Med list updated.  Pt has no further questions at this time.

## 2021-08-15 NOTE — Telephone Encounter (Signed)
Call transferred directly to this RN from scheduling.  I spoke with Nevin Bloodgood, RN for [facility]. She is currently in the home with the patient and called today with concerns about her low BP since starting Entresto.  The patient was seen in the office on 08/05/21 with Ignacia Bayley, NP. The patient was advised at that visit to:  1) STOP Losartan   2) START Entresto 24/26 mg: - take 1 tablet by mouth TWICE daily    3) DECREASE Amiodarone 200 mg: - take 1 tablet by mouth ONCE daily   Per Nevin Bloodgood, RN- the patient's BP upon her arrival to see the her today was: 94/60 With walking she was 88/64 With standing she was 80/50.  The patient has been complaining of intermittent dizziness since starting Entresto, but has been trying to tolerate the medication.  Per Nevin Bloodgood, the patient has been recording her BP at home; 2 days ago she was 77/64 after her AM dose of entresto. She held the PM dose that day and her BP was 105/73.  The patient has also been experiencing some dizziness/ possible orthostatic intolerance in the shower as well.   Her CBG's have been running 201/ 300/ 250/ 211 ( AM readings). I did a quick micromedex search and did not see hyperglycemia listed as a side effect of entresto.  Nevin Bloodgood was wanting to see if the entresto could be cut down to 1 tablet QD. I have advised Nevin Bloodgood that the drug is a BID dosing, so she would not be getting the full benefit of the drug. I have asked Nevin Bloodgood to please have the patient hold her entresto doses until we can review with Ignacia Bayley, NP and call her back.   Hydration has been encouraged as well.  Nevin Bloodgood voices understanding and is agreeable.

## 2021-08-15 NOTE — Telephone Encounter (Signed)
Malissa Hippo calling from [facility] is calling to request if the OT is ok to continue  for next week instead of this week. CB- [phone] Verbal ok ok on VM

## 2021-08-16 ENCOUNTER — Ambulatory Visit: Payer: Medicare Other | Admitting: Cardiovascular Disease

## 2021-08-17 ENCOUNTER — Telehealth: Payer: Self-pay

## 2021-08-17 ENCOUNTER — Telehealth: Payer: Self-pay | Admitting: Nurse Practitioner

## 2021-08-17 NOTE — Telephone Encounter (Signed)
Verbal order given  

## 2021-08-17 NOTE — Telephone Encounter (Signed)
° °  Telephone encounter was:  Successful.  08/17/2021 Name: Michele [name] MRN: [id number] DOB: 04-Dec-1955  Michele [name] is a 66 y.o. year old female who is a primary care patient of Jerrol Banana., MD . The community resource team was consulted for assistance with Transportation Needs   Care guide performed the following interventions: Pt advised her sister in law will be taking her to 2/10 appt. However, pt will need door to door assistance on 2/17. She has a walker and a ramp at home. Pt does not have insurance transportation either. [facility] is booked on 2/17 and [employer] advised application process takes 45 days. I will send 2/17 request to [facility] transportation. [facility] now has pt's appointments for 3/10, 3/29 and 4/27.  Follow Up Plan:  Care guide will follow up with patient by phone over the next few days.  [facility]  [place], [address]  Main Phone: [phone]   E-mail: [email or web address]  Website: [email or web address]

## 2021-08-17 NOTE — Telephone Encounter (Signed)
Pt c/o medication issue:  1. Name of Medication: please advise rx or otc for nausea due to post nasal drip   2. How are you currently taking this medication (dosage and times per day)? Please advise  3. Are you having a reaction (difficulty breathing--STAT)? Nausea   4. What is your medication issue? Patient states she has had delay taking medication this morning due to dry heaving and nausea from post nasal drip issue.   Patient wants advise on meds safe to take given other medications for nausea .  Patient isnt sure if she needs something otc or rx .   Patient advised while waiting on call back to reach out to pcp office to discuss issue as well .

## 2021-08-18 NOTE — Telephone Encounter (Signed)
Spoke with patient and she reports her symptoms are better today. She inquired about medication for this and advised she should check with her primary care provider. She verbalized understanding with no further questions at this time.

## 2021-08-19 ENCOUNTER — Ambulatory Visit: Payer: Medicare Other

## 2021-08-19 ENCOUNTER — Other Ambulatory Visit: Payer: Self-pay

## 2021-08-19 DIAGNOSIS — Z794 Long term (current) use of insulin: Secondary | ICD-10-CM

## 2021-08-19 DIAGNOSIS — E1142 Type 2 diabetes mellitus with diabetic polyneuropathy: Secondary | ICD-10-CM

## 2021-08-19 DIAGNOSIS — M2141 Flat foot [pes planus] (acquired), right foot: Secondary | ICD-10-CM

## 2021-08-19 NOTE — Progress Notes (Signed)
SITUATION Reason for Consult: Evaluation for Prefabricated Diabetic Shoes and Bilateral Custom Diabetic Inserts. Patient / Caregiver Report: Patient would like well fitting shoes  OBJECTIVE DATA: Patient History / Diagnosis:    ICD-10-CM   1. Type 2 diabetes mellitus with diabetic polyneuropathy, with long-term current use of insulin (HCC)  E11.42    Z79.4     2. Pes planus of both feet  M21.41    M21.42       Current or Previous Devices:   None and no history  In-Person Foot Examination: Ulcers & Callousing:   Historical  Toe / Foot Deformities:   - Pes Planus    Shoe Size: 8W  ORTHOTIC RECOMMENDATION Recommended Devices: - 1x pair prefabricated PDAC approved diabetic shoes: [id number] [id number] - 3x pair custom-to-patient vacuum formed diabetic insoles.   GOALS OF SHOES AND INSOLES - Reduce shear and pressure - Reduce / Prevent callus formation - Reduce / Prevent ulceration - Protect the fragile healing compromised diabetic foot.  Patient would benefit from diabetic shoes and inserts as patient has diabetes mellitus and the patient has one or more of the following conditions: - History of partial or complete amputation of the foot - History of previous foot ulceration. - History of pre-ulcerative callus - Peripheral neuropathy with evidence of callus formation - Foot deformity - Poor circulation  ACTIONS PERFORMED Patient was casted for insoles via crush box and measured for shoes via brannock device. Procedure was explained and patient tolerated procedure well. All questions were answered and concerns addressed.  PLAN Patient is to ensure treating physician receives and completes diabetic paperwork. Casts and shoe order are to be held until paperwork is received. Once received patient is to be scheduled for fitting in four weeks.

## 2021-08-23 ENCOUNTER — Telehealth: Payer: Self-pay

## 2021-08-23 NOTE — Telephone Encounter (Signed)
° °  Telephone encounter was:  Successful.  08/23/2021 Name: Michele [name] MRN: [id number] DOB: 23-Jul-1955  Michele [name] is a 66 y.o. year old female who is a primary care patient of Jerrol Banana., MD . The community resource team was consulted for assistance with Transportation Needs   Care guide performed the following interventions:  Patient has been advised on information regarding her upcoming transportation rides. On 2/17 patient is scheduled for a pick up [facility] [employer] . For ongoing rides, [facility] will be assisting patient. [employer] transportation coordinator has knowledge on pt's future appointments as well. They have been given 3/10, 3/29, and 4/27 appointment information.  Follow Up Plan:  No further follow up planned at this time. The patient has been provided with needed resources. Patient advised she does not have any further questions or concerns and will call back in if needed.  [facility] management  [place], [address]  Main Phone: [phone]   E-mail: [email or web address]  Website: [email or web address]

## 2021-08-25 ENCOUNTER — Telehealth: Payer: Self-pay | Admitting: Cardiovascular Disease

## 2021-08-25 MED ORDER — AMIODARONE HCL 200 MG PO TABS: 200.0000 mg | ORAL_TABLET | Freq: Every day | ORAL | 0 refills | Status: DC

## 2021-08-25 NOTE — Telephone Encounter (Signed)
Requested Prescriptions   Signed Prescriptions Disp Refills   amiodarone (PACERONE) 200 MG tablet 90 tablet 0    Sig: Take 1 tablet (200 mg total) by mouth daily.    Authorizing Provider: Theora Gianotti    Ordering User: Britt Bottom

## 2021-08-25 NOTE — Telephone Encounter (Signed)
°*  STAT* If patient is at the pharmacy, call can be transferred to refill team.   1. Which medications need to be refilled? (please list name of each medication and dose if known) Amiodarone 200 1 tablet daily, Eliquis 5mg  1 tablet twice a day  2. Which pharmacy/location (including street and city if local pharmacy) is medication to be sent to? [facility]  3. Do they need a 30 day or 90 day supply? 90 day

## 2021-08-26 ENCOUNTER — Ambulatory Visit (HOSPITAL_BASED_OUTPATIENT_CLINIC_OR_DEPARTMENT_OTHER): Payer: Medicare Other | Admitting: Family

## 2021-08-26 ENCOUNTER — Other Ambulatory Visit: Payer: Self-pay

## 2021-08-26 ENCOUNTER — Other Ambulatory Visit
Admission: RE | Admit: 2021-08-26 | Discharge: 2021-08-26 | Disposition: A | Discharge status: Home / Self Care | Payer: Medicare Other | Source: Ambulatory Visit | Attending: Family | Admitting: Family

## 2021-08-26 ENCOUNTER — Encounter: Payer: Self-pay | Admitting: Family

## 2021-08-26 VITALS — BP 117/81 | HR 85 | Resp 18 | Ht 62.0 in | Wt 182.6 lb

## 2021-08-26 DIAGNOSIS — Z7901 Long term (current) use of anticoagulants: Secondary | ICD-10-CM | POA: Insufficient documentation

## 2021-08-26 DIAGNOSIS — K219 Gastro-esophageal reflux disease without esophagitis: Secondary | ICD-10-CM | POA: Insufficient documentation

## 2021-08-26 DIAGNOSIS — R42 Dizziness and giddiness: Secondary | ICD-10-CM | POA: Insufficient documentation

## 2021-08-26 DIAGNOSIS — R609 Edema, unspecified: Secondary | ICD-10-CM | POA: Diagnosis not present

## 2021-08-26 DIAGNOSIS — I4819 Other persistent atrial fibrillation: Secondary | ICD-10-CM | POA: Diagnosis not present

## 2021-08-26 DIAGNOSIS — Z853 Personal history of malignant neoplasm of breast: Secondary | ICD-10-CM | POA: Insufficient documentation

## 2021-08-26 DIAGNOSIS — E119 Type 2 diabetes mellitus without complications: Secondary | ICD-10-CM | POA: Insufficient documentation

## 2021-08-26 DIAGNOSIS — R5383 Other fatigue: Secondary | ICD-10-CM | POA: Diagnosis present

## 2021-08-26 DIAGNOSIS — I5022 Chronic systolic (congestive) heart failure: Secondary | ICD-10-CM | POA: Diagnosis not present

## 2021-08-26 DIAGNOSIS — I272 Pulmonary hypertension, unspecified: Secondary | ICD-10-CM | POA: Insufficient documentation

## 2021-08-26 DIAGNOSIS — E1122 Type 2 diabetes mellitus with diabetic chronic kidney disease: Secondary | ICD-10-CM

## 2021-08-26 DIAGNOSIS — N1831 Chronic kidney disease, stage 3a: Secondary | ICD-10-CM

## 2021-08-26 DIAGNOSIS — I2510 Atherosclerotic heart disease of native coronary artery without angina pectoris: Secondary | ICD-10-CM | POA: Insufficient documentation

## 2021-08-26 DIAGNOSIS — Z79899 Other long term (current) drug therapy: Secondary | ICD-10-CM | POA: Insufficient documentation

## 2021-08-26 DIAGNOSIS — I428 Other cardiomyopathies: Secondary | ICD-10-CM | POA: Insufficient documentation

## 2021-08-26 DIAGNOSIS — I4891 Unspecified atrial fibrillation: Secondary | ICD-10-CM | POA: Insufficient documentation

## 2021-08-26 DIAGNOSIS — I110 Hypertensive heart disease with heart failure: Secondary | ICD-10-CM | POA: Insufficient documentation

## 2021-08-26 DIAGNOSIS — Z794 Long term (current) use of insulin: Secondary | ICD-10-CM

## 2021-08-26 DIAGNOSIS — Z7984 Long term (current) use of oral hypoglycemic drugs: Secondary | ICD-10-CM | POA: Insufficient documentation

## 2021-08-26 DIAGNOSIS — Z9989 Dependence on other enabling machines and devices: Secondary | ICD-10-CM | POA: Insufficient documentation

## 2021-08-26 DIAGNOSIS — S5011XA Contusion of right forearm, initial encounter: Secondary | ICD-10-CM | POA: Insufficient documentation

## 2021-08-26 DIAGNOSIS — G4730 Sleep apnea, unspecified: Secondary | ICD-10-CM | POA: Insufficient documentation

## 2021-08-26 DIAGNOSIS — E785 Hyperlipidemia, unspecified: Secondary | ICD-10-CM | POA: Insufficient documentation

## 2021-08-26 DIAGNOSIS — I10 Essential (primary) hypertension: Secondary | ICD-10-CM

## 2021-08-26 DIAGNOSIS — E039 Hypothyroidism, unspecified: Secondary | ICD-10-CM | POA: Insufficient documentation

## 2021-08-26 DIAGNOSIS — X58XXXA Exposure to other specified factors, initial encounter: Secondary | ICD-10-CM | POA: Insufficient documentation

## 2021-08-26 LAB — BASIC METABOLIC PANEL
Anion gap: 11 (ref 5–15)
BUN: 47 mg/dL — ABNORMAL HIGH (ref 8–23)
CO2: 27 mmol/L (ref 22–32)
Calcium: 9.6 mg/dL (ref 8.9–10.3)
Chloride: 99 mmol/L (ref 98–111)
Creatinine, Ser: 1.35 mg/dL — ABNORMAL HIGH (ref 0.44–1.00)
GFR, Estimated: 43 mL/min — ABNORMAL LOW (ref >60)
Glucose, Bld: 155 mg/dL — ABNORMAL HIGH (ref 70–99)
Potassium: 3.7 mmol/L (ref 3.5–5.1)
Sodium: 137 mmol/L (ref 135–145)

## 2021-08-26 MED ORDER — APIXABAN 5 MG PO TABS: 5.0000 mg | ORAL_TABLET | Freq: Two times a day (BID) | ORAL | 5 refills | Status: DC

## 2021-08-26 NOTE — Progress Notes (Signed)
Patient ID: Michele [name], female    DOB: [birth year], 66 y.o.   MRN: [id number]  HPI  Michele [name] is a 66 y/o female with a history of atrial fibrillation, breast cancer, CAD, DM, hyperlipidemia, HTN, thyroid disease, GERD, sleep apnea and chronic heart failure.   TEE report from 07/19/21 reviewed and showed an EF of <20% along with severe LAE, mild/moderate MR/TR and PA pressure of 32.5 mmHg. No aortic stenosis.   RHC/LHC done 07/15/21 and showed:   Mid LAD lesion is 70% stenosed.   2nd Diag lesion is 40% stenosed.   Prox RCA lesion is 40% stenosed.  1.  Significant one-vessel coronary artery disease with 70% stenosis in the mid LAD.  Sluggish TIMI II flow in all coronary arteries likely due to severely reduced cardiac output. 2.  Left ventricular angiography was not performed due to acute kidney injury.  EF was severely reduced by echo. 3.  Right heart catheterization showed moderately elevated filling pressures, mild pulmonary hypertension and severely reduced cardiac output.  Hemodynamics are consistent with cardiogenic shock with a cardiac output of 2.19 and cardiac index of 1.13.  Recommendations: The patient has nonischemic cardiomyopathy as the stenosis in the LAD does not explain degree of decreased ejection fraction. She is volume overloaded with critically low cardiac output.  I am going to start the patient on milrinone drip and start IV diuresis.  I stopped metoprolol. Atrial fibrillation is complicating the picture and for now we will use IV amiodarone for rate control with plans to proceed with TEE guided cardioversion early next week.  Admitted 07/13/21 due to shortness of breath for the last week. CTA angio chest showed incomplete opacification of segmental branches of the right lower lobe pulmonary artery, PE could not be excluded. Antibiotics started. She became hypotensive and was transferred to ICU needing vasopressors. Placed on bipap due to increased oxygen requirement.  Cardiology / EP consults obtained. Found to be in cardiogenic shock needing milirone drip and IV lasix. Was cardioverted due to atrial fibrillation. Transitioned to oral medications. PT/OT evaluations done. Discharged after 11 days.   She presents today for a follow-up visit with a chief complaint of minimal fatigue upon moderate exertion. She describes this as chronic in nature. She has associated pedal edema, light-headedness along with this. She denies any difficulty sleeping, abdominal distention, palpitations, chest pain, shortness of breath, cough or weight gain.   No further issues with a low BP since she went back on losartan and stopped the entresto  Past Medical History:  Diagnosis Date   Abscess of breast, right 08/19/2012   Breast cancer ([place]) 07/13/2010   1.5 cm,intermediate grade DCIS, nuclear grade 2, ER 90%, PR 90% treated with wide excision, reexcision to negative margins and MammoSite partial breast radiation.   Coronary artery disease    a. 2009 Cath: nonobs LAD dzs; b. 07/2021 Cath: LM nl, LAD 31m D1 mild dzs, D2 40, LCX nl, RCA 40p. PCWP 24. PA 37/26(30).   DCIS (ductal carcinoma in situ) of breast, right 2012   Diabetes mellitus ([place]    Type II   GERD (gastroesophageal reflux disease)    HFrEF (heart failure with reduced ejection fraction) ([place]    a. 2009 Echo: LV dysfxn; b. 2013 Echo: EF 50-55%; c. 07/2021 Echo: EF<20%, glob HK - in/inflat moves best. Nl PASP. Sev dil LA. Mildly dil RA. Mild-mod MR.   Hyperlipidemia    Hypertension    Ischemic cardiomyopathy    Obesity, unspecified  Persistent atrial fibrillation ([place])    a. 07/2021 s/p TEE and DCCV (150J)-->amio/eliquis (CHA2DS2VASc 6).   Personal history of radiation therapy    Sleep apnea    Thyroid disease    hypothyroidism   Umbilical hernia without mention of obstruction or gangrene    Past Surgical History:  Procedure Laterality Date   BREAST BIOPSY Right 2007   right bx neg   BREAST BIOPSY Right  08/10/2016   fat necrosis/ done in Dr. Dwyane Luo office   BREAST EXCISIONAL BIOPSY Right 2011   DCIS mammosite lumpectomy   BREAST LUMPECTOMY Right Jan 2012   Wide excision   BREAST MASS EXCISION Right 2011   December   CARDIAC CATHETERIZATION  01/08/2008   CHOLECYSTECTOMY  2013   [facility]   HERNIA REPAIR  2013   epigastric   INCISION AND DRAINAGE BREAST ABSCESS Right 08/19/2012   RIGHT/LEFT HEART CATH AND CORONARY ANGIOGRAPHY N/A 07/15/2021   Procedure: RIGHT/LEFT HEART CATH AND CORONARY ANGIOGRAPHY;  Surgeon: [name], MD;  Location: [facility];  Service: Cardiovascular;  Laterality: N/A;   TEE WITHOUT CARDIOVERSION N/A 07/19/2021   Procedure: TRANSESOPHAGEAL ECHOCARDIOGRAM (TEE);  Surgeon: Minna Merritts, MD;  Location: [facility];  Service: Cardiovascular;  Laterality: N/A;   TONSILLECTOMY AND ADENOIDECTOMY     age 54 yrs   Family History  Problem Relation Age of Onset   Heart failure Mother    Diabetes Mother    Congestive Heart Failure Mother    CVA Mother    Heart attack Father    Arthritis Father    Diabetes Sister    Hypertension Sister    Lung cancer Sister    Hypertension Brother    Hypertension Brother    Hemochromatosis Brother    Stroke Maternal Grandmother    Heart attack Maternal Grandfather    Heart attack Paternal Grandmother    Breast cancer Neg Hx    Social History   Tobacco Use   Smoking status: Never   Smokeless tobacco: Never  Substance Use Topics   Alcohol use: No   No Known Allergies  Prior to Admission medications   Medication Sig Start Date End Date Taking? Authorizing Provider  amiodarone (PACERONE) 200 MG tablet Take 1 tablet (200 mg total) by mouth daily. 08/25/21  Yes Theora Gianotti, NP  Blood Glucose Monitoring Suppl (ONE TOUCH ULTRA SYSTEM KIT) w/Device KIT Check sugar once daily. DX E11.9 10/18/16  Yes Jerrol Banana., MD  clotrimazole-betamethasone (LOTRISONE) cream Apply 1  application topically 2 (two) times daily. 07/27/21  Yes McDonald, Stephan Minister, DPM  empagliflozin (JARDIANCE) 10 MG TABS tablet Take 10 mg by mouth daily.   Yes [provider]  fluticasone (FLONASE) 50 MCG/ACT nasal spray Place into the nose. 02/24/13  Yes [provider]  glucose blood (ONE TOUCH ULTRA TEST) test strip Use as instructed 12/10/17  Yes Jerrol Banana., MD  insulin glargine (LANTUS SOLOSTAR) 100 UNIT/ML Solostar Pen Inject 15 Units into the skin 2 (two) times daily. Patient taking differently: Inject 50 Units into the skin 2 (two) times daily. 07/24/21  Yes Dessa Phi, DO  Insulin Pen Needle 32G X 4 MM MISC Inject twice daily, SQ. DX E11.9 11/19/19  Yes Jerrol Banana., MD  Lancets [facility] ULTRASOFT) lancets Check sugar once daily DX E11.9 12/10/17  Yes Jerrol Banana., MD  levothyroxine (SYNTHROID) 200 MCG tablet TAKE 1 TABLET(200 MCG) BY MOUTH  DAILY BEFORE BREAKFAST 06/22/21  Yes Myles Gip, DO  losartan (COZAAR) 25 MG tablet Take 0.5 tablets (12.5 mg total) by mouth daily. 08/17/21  Yes Theora Gianotti, NP  metoprolol succinate (TOPROL-XL) 25 MG 24 hr tablet Take 0.5 tablets (12.5 mg total) by mouth daily. 07/25/21  Yes Dessa Phi, DO  NON FORMULARY CPAP AT BEDTIME   Yes [provider]  rosuvastatin (CRESTOR) 10 MG tablet TAKE 1 TABLET BY MOUTH  DAILY 11/28/19  Yes Gollan, Kathlene November, MD  Syringe/Needle, Disp, (SYRINGE 3CC/25GX1") 25G X 1" 3 ML MISC 1 mL by Does not apply route every 30 (thirty) days. 05/03/16  Yes Jerrol Banana., MD  torsemide (DEMADEX) 20 MG tablet Take 1 tablet (20 mg total) by mouth daily. 08/11/21  Yes Theora Gianotti, NP  apixaban (ELIQUIS) 5 MG TABS tablet Take 1 tablet (5 mg total) by mouth 2 (two) times daily. 08/26/21   Alisa Graff, FNP  cetirizine (ZYRTEC) 10 MG tablet Take 1 tablet (10 mg total) by mouth daily. Patient not taking: Reported on 08/26/2021 03/18/18   Jerrol Banana., MD  Glucagon (GVOKE HYPOPEN 1-PACK) 1 MG/0.2ML SOAJ Inject 1 mg into the skin every 15 (fifteen) minutes as needed for up to 5 doses. Patient not taking: Reported on 08/26/2021 06/22/21   Myles Gip, DO    Review of Systems  Constitutional:  Positive for fatigue (improving). Negative for appetite change.  HENT:  Negative for congestion, postnasal drip and sore throat.   Eyes: Negative.   Respiratory:  Negative for cough and shortness of breath.   Cardiovascular:  Positive for leg swelling. Negative for chest pain and palpitations.  Gastrointestinal:  Negative for abdominal distention and abdominal pain.  Endocrine: Negative.   Genitourinary: Negative.   Musculoskeletal:  Negative for back pain and neck pain.  Skin: Negative.   Allergic/Immunologic: Negative.   Neurological:  Positive for weakness (legs) and light-headedness. Negative for dizziness.  Hematological:  Negative for adenopathy. Bruises/bleeds easily.  Psychiatric/Behavioral:  Negative for dysphoric mood and sleep disturbance (has CPAP; sleeping on 2 pillows). The patient is not nervous/anxious.    Vitals:   08/26/21 1304  BP: 117/81  Pulse: 85  Resp: 18  SpO2: 97%  Weight: 182 lb 9 oz (82.8 kg)  Height: '5\' 2"'  (1.575 m)   Wt Readings from Last 3 Encounters:  08/26/21 182 lb 9 oz (82.8 kg)  08/05/21 195 lb (88.5 kg)  07/29/21 192 lb 14.4 oz (87.5 kg)   Lab Results  Component Value Date   CREATININE 1.44 (H) 08/10/2021   CREATININE 1.35 (H) 07/26/2021   CREATININE 1.31 (H) 07/24/2021   Physical Exam Vitals and nursing note reviewed. Exam conducted with a chaperone present (sister in law).  Constitutional:      Appearance: Normal appearance.  HENT:     Head: Normocephalic and atraumatic.  Cardiovascular:     Rate and Rhythm: Normal rate and regular rhythm.  Pulmonary:     Effort: Pulmonary effort is normal. No respiratory distress.     Breath sounds: No wheezing or rales.  Abdominal:      General: There is no distension.     Palpations: Abdomen is soft.  Musculoskeletal:        General: No tenderness.     Cervical back: Normal range of motion and neck supple.     Right lower leg: No edema.     Left lower leg: No edema.  Skin:  General: Skin is warm and dry.     Findings: Bruising (entire right forearm) present.  Neurological:     General: No focal deficit present.     Mental Status: She is alert and oriented to person, place, and time.  Psychiatric:        Mood and Affect: Mood normal.        Behavior: Behavior normal.        Thought Content: Thought content normal.   Assessment & Plan:  1: Chronic heart failure with reduced ejection fraction- - NYHA class II - euvolemic today - weighing daily; reminded to call for an overnight weight gain of > 2 pounds or a weekly weight gain of > 5 pounds - weight down 9 pounds from last visit here 1 month ago - admits to adding "some" salt but reports doing better than she was; reviewed the importance of not adding any salt to her food & reading food labels to keep sodium content to ~ 2024m / day - on GDMT of losartan & jardiance - became hypotensive with entresto - ever since being placed on jardiance, she notices that she urinates "all the time" - advised patient that she could take the torsemide PRN for above weight gain, swelling or SOB - will be finishing with PT early March and is interested in pulmonary rehab - she sees cardiology mid March and will discuss this further with them - BNP 07/13/21 was 738.3  2: HTN- - BP looks good (117/81) - saw PCP (Rollene Rotunda 07/29/21 - BMP 08/10/21 reviewed and showed sodium 137, potassium 4.2, creatinine 1.44 & GFR 40 - recheck BMP today  3: DM- - A1c 06/22/21 was 10.0%  4: Atrial fibrillation- - cardioverted in the past - saw cardiology (Sharolyn Douglas 08/05/21 - currently taking amiodarone and apixaban   Patient did not bring her medications nor a list. Each medication was verbally  reviewed with the patient and she was encouraged to bring the bottles to every visit to confirm accuracy of list.   Return in 3 months, sooner if needed

## 2021-08-26 NOTE — Patient Instructions (Addendum)
Continue weighing daily and call for an overnight weight gain of 3 pounds or more or a weekly weight gain of more than 5 pounds.  °

## 2021-09-12 ENCOUNTER — Other Ambulatory Visit: Payer: Self-pay | Admitting: General Surgery

## 2021-09-12 DIAGNOSIS — D0510 Intraductal carcinoma in situ of unspecified breast: Secondary | ICD-10-CM

## 2021-09-12 DIAGNOSIS — Z1231 Encounter for screening mammogram for malignant neoplasm of breast: Secondary | ICD-10-CM

## 2021-09-15 ENCOUNTER — Telehealth: Payer: Self-pay

## 2021-09-15 NOTE — Telephone Encounter (Signed)
Casts sent to [employer] ?

## 2021-09-16 ENCOUNTER — Ambulatory Visit (INDEPENDENT_AMBULATORY_CARE_PROVIDER_SITE_OTHER): Payer: Medicare Other | Admitting: Cardiovascular Disease

## 2021-09-16 ENCOUNTER — Other Ambulatory Visit: Payer: Self-pay

## 2021-09-16 ENCOUNTER — Telehealth: Payer: Self-pay | Admitting: Cardiovascular Disease

## 2021-09-16 ENCOUNTER — Encounter: Payer: Self-pay | Admitting: Cardiovascular Disease

## 2021-09-16 ENCOUNTER — Other Ambulatory Visit: Payer: Self-pay | Admitting: Cardiovascular Disease

## 2021-09-16 VITALS — BP 90/60 | HR 148 | Ht 62.0 in | Wt 177.0 lb

## 2021-09-16 DIAGNOSIS — R570 Cardiogenic shock: Secondary | ICD-10-CM

## 2021-09-16 DIAGNOSIS — I420 Dilated cardiomyopathy: Secondary | ICD-10-CM

## 2021-09-16 DIAGNOSIS — Z79899 Other long term (current) drug therapy: Secondary | ICD-10-CM

## 2021-09-16 DIAGNOSIS — I4819 Other persistent atrial fibrillation: Secondary | ICD-10-CM

## 2021-09-16 DIAGNOSIS — I4891 Unspecified atrial fibrillation: Secondary | ICD-10-CM

## 2021-09-16 DIAGNOSIS — I25118 Atherosclerotic heart disease of native coronary artery with other forms of angina pectoris: Secondary | ICD-10-CM | POA: Diagnosis not present

## 2021-09-16 DIAGNOSIS — I2510 Atherosclerotic heart disease of native coronary artery without angina pectoris: Secondary | ICD-10-CM

## 2021-09-16 DIAGNOSIS — I5021 Acute systolic (congestive) heart failure: Secondary | ICD-10-CM

## 2021-09-16 DIAGNOSIS — E782 Mixed hyperlipidemia: Secondary | ICD-10-CM

## 2021-09-16 DIAGNOSIS — I10 Essential (primary) hypertension: Secondary | ICD-10-CM

## 2021-09-16 MED ORDER — METOPROLOL SUCCINATE ER 25 MG PO TB24: 12.5000 mg | ORAL_TABLET | Freq: Every day | ORAL | 3 refills | Status: DC

## 2021-09-16 MED ORDER — AMIODARONE HCL 200 MG PO TABS: 400.0000 mg | ORAL_TABLET | Freq: Every day | ORAL | 3 refills | Status: DC

## 2021-09-16 MED ORDER — DIGOXIN 125 MCG PO TABS: 0.1250 mg | ORAL_TABLET | Freq: Every day | ORAL | 3 refills | Status: DC

## 2021-09-16 NOTE — Telephone Encounter (Signed)
Incoming call from Michele [name], pt concern for pharmacy filled medication in mcg instead of mg for her digoxin. Advised same dose, pharmacy had tabs from pharmaceutical that used different conversion of medication. Assured pt same dosing as order by Dr. Rockey Situ. ? ?Reviewed instructions for taking her Digoxin today, Saturday, and there on out. Pt has appt Monday with Dr. Rockey Situ for f/u on A-fib. ? ?Pt verbalized understanding and thankful for taking her call.  ?

## 2021-09-16 NOTE — Progress Notes (Signed)
Cardiology Office Note  Date:  09/16/2021   ID:  Michele [name], DOB 1956-02-21, MRN [id number]  PCP:  Jerrol Banana., MD   Chief Complaint  Patient presents with   6 week follow up     Patient c/o dizziness for the past couple of days with her blood pressure running low with readings of 95/60.  Medications reviewed by the patient verbally.     HPI:  Michele [name] is a  66 year old woman with past medical history of morbid obesity,  coronary artery disease with 50% proximal LAD disease , followed by a 30% lesion by catheterization July 2009,  diabetes, hemoglobin A1c, poorly controlled hyperlipidemia,  ejection fraction 30% by echocardiogram in June 2009,  moderately dilated left atrium and left ventricle,  obstructive sleep apnea and wears CPAP. Whoe presents for f/u of her  coronary artery disease, hyperlipidemia , Afib  Last seen by myself in the office November 2019 Seen by one of our providers January 2023   07/2021, progressive weakness and dyspnea w/ finding of AFib w/ RVR.  Hospital admission to [facility] due to rapid afib and acute hypoxic resp failure.  CHF was noted on CTA chest.   PE could not be excluded.   hypotensive and required norepinephrine.   Echocardiogram on January 5 showed EF of less than 20% with global hypokinesis.  The inferior and inferolateral walls were best preserved.  The right ventricular function was moderately reduced with small pericardial effusion, and mild to moderate mitral regurgitation.    right and left heart cardiac catheterization on July 15 2021 showing moderately elevated filling pressures and severely reduced cardiac output (2.19) and index (1.13).   70% stenosis in the mid LAD, which was not felt to be flow-limiting and continued IV diuresis with initiation of milrinone was recommended, as well as TEE cardioversion.  Dyspnea improved with diuresis and she was eventually able to be weaned off of norepinephrine.    TEE and  cardioversion was successfully performed on January 10, with restoration of sinus rhythm.   Amiodarone therapy was initiated.   Milrinone was discontinued on January 11, and beta-blocker and ARB therapy were added.   transitioned to oral torsemide , discharged home on January 15 at 193 pounds.  In heart failure clinic follow-up on January 17,  torsemide dose was reduced to 40 mg daily  In follow-up today reports having some shortness of breath for the past several days, some dizziness May have been worse today, noticed elevated heart rate today Has not taken her amiodarone, metoprolol today Feels blood pressure is low  EKG personally reviewed by myself on todays visit Atrial fibrillation rate 148 bpm left bundle branch block   PMH:   has a past medical history of Abscess of breast, right (08/19/2012), Breast cancer ([place]) (07/13/2010), Coronary artery disease, DCIS (ductal carcinoma in situ) of breast, right (2012), Diabetes mellitus ([place]), GERD (gastroesophageal reflux disease), HFrEF (heart failure with reduced ejection fraction) ([place]), Hyperlipidemia, Hypertension, Ischemic cardiomyopathy, Obesity, unspecified, Persistent atrial fibrillation ([place]), Personal history of radiation therapy, Sleep apnea, Thyroid disease, and Umbilical hernia without mention of obstruction or gangrene.  PSH:    Past Surgical History:  Procedure Laterality Date   BREAST BIOPSY Right 2007   right bx neg   BREAST BIOPSY Right 08/10/2016   fat necrosis/ done in Dr. Dwyane Luo office   BREAST EXCISIONAL BIOPSY Right 2011   DCIS mammosite lumpectomy   BREAST LUMPECTOMY Right Jan 2012   Wide excision   BREAST  MASS EXCISION Right 2011   December   CARDIAC CATHETERIZATION  01/08/2008   CHOLECYSTECTOMY  2013   [facility]   HERNIA REPAIR  2013   epigastric   INCISION AND DRAINAGE BREAST ABSCESS Right 08/19/2012   RIGHT/LEFT HEART CATH AND CORONARY ANGIOGRAPHY N/A 07/15/2021   Procedure:  RIGHT/LEFT HEART CATH AND CORONARY ANGIOGRAPHY;  Surgeon: [name], MD;  Location: [place] [facility];  Service: Cardiovascular;  Laterality: N/A;   TEE WITHOUT CARDIOVERSION N/A 07/19/2021   Procedure: TRANSESOPHAGEAL ECHOCARDIOGRAM (TEE);  Surgeon: Minna Merritts, MD;  Location: [facility] ORS;  Service: Cardiovascular;  Laterality: N/A;   TONSILLECTOMY AND ADENOIDECTOMY     age 28 yrs    Current Outpatient Medications  Medication Sig Dispense Refill   amiodarone (PACERONE) 200 MG tablet Take 1 tablet (200 mg total) by mouth daily. 90 tablet 0   apixaban (ELIQUIS) 5 MG TABS tablet Take 1 tablet (5 mg total) by mouth 2 (two) times daily. 60 tablet 5   Blood Glucose Monitoring Suppl (ONE TOUCH ULTRA SYSTEM KIT) w/Device KIT Check sugar once daily. DX E11.9 1 each 0   clotrimazole-betamethasone (LOTRISONE) cream Apply 1 application topically 2 (two) times daily. 60 g 2   empagliflozin (JARDIANCE) 10 MG TABS tablet Take 10 mg by mouth daily.     fluticasone (FLONASE) 50 MCG/ACT nasal spray Place into the nose.     insulin glargine (LANTUS SOLOSTAR) 100 UNIT/ML Solostar Pen Inject 15 Units into the skin 2 (two) times daily. (Patient taking differently: Inject 50 Units into the skin 2 (two) times daily.) 15 mL 0   Insulin Pen Needle 32G X 4 MM MISC Inject twice daily, SQ. DX E11.9 200 each 3   Lancets (ONETOUCH ULTRASOFT) lancets Check sugar once daily DX E11.9 100 each 3   levothyroxine (SYNTHROID) 200 MCG tablet TAKE 1 TABLET(200 MCG) BY MOUTH DAILY BEFORE BREAKFAST 90 tablet 0   losartan (COZAAR) 25 MG tablet Take 0.5 tablets (12.5 mg total) by mouth daily.     metoprolol succinate (TOPROL-XL) 25 MG 24 hr tablet Take 0.5 tablets (12.5 mg total) by mouth daily. 30 tablet 0   NON FORMULARY CPAP AT BEDTIME     rosuvastatin (CRESTOR) 10 MG tablet TAKE 1 TABLET BY MOUTH  DAILY 90 tablet 1   Syringe/Needle, Disp, (SYRINGE 3CC/25GX1") 25G X 1" 3 ML MISC 1 mL by Does not apply route every 30  (thirty) days. 12 each 1   torsemide (DEMADEX) 20 MG tablet Take 1 tablet (20 mg total) by mouth daily. 90 tablet 3   cetirizine (ZYRTEC) 10 MG tablet Take 1 tablet (10 mg total) by mouth daily. (Patient not taking: Reported on 08/26/2021) 30 tablet 11   Glucagon (GVOKE HYPOPEN 1-PACK) 1 MG/0.2ML SOAJ Inject 1 mg into the skin every 15 (fifteen) minutes as needed for up to 5 doses. (Patient not taking: Reported on 08/26/2021) 1 mL 3   glucose blood (ONE TOUCH ULTRA TEST) test strip Use as instructed (Patient not taking: Reported on 09/16/2021) 100 each 3   No current facility-administered medications for this visit.     Allergies:   Entresto [sacubitril-valsartan]   Social History:  The patient  reports that she has never smoked. She has never used smokeless tobacco. She reports that she does not drink alcohol and does not use drugs.   Family History:   family history includes Arthritis in her father; CVA in her mother; Congestive Heart Failure  in her mother; Diabetes in her mother and sister; Heart attack in her father, maternal grandfather, and paternal grandmother; Heart failure in her mother; Hemochromatosis in her brother; Hypertension in her brother, brother, and sister; Lung cancer in her sister; Stroke in her maternal grandmother.    Review of Systems: Review of Systems  Constitutional: Negative.   Respiratory: Negative.    Cardiovascular: Negative.   Gastrointestinal: Negative.   Musculoskeletal: Negative.   Neurological: Negative.   Psychiatric/Behavioral: Negative.    All other systems reviewed and are negative.   PHYSICAL EXAM: VS:  BP 90/60 (BP Location: Left Arm, Patient Position: Sitting, Cuff Size: Normal)    Pulse (!) 148    Ht _0  (1.575 m)    Wt 177 lb (80.3 kg)    BMI 32.37 kg/m  , BMI Body mass index is 32.37 kg/m. Constitutional:  oriented to person, place, and time. No distress.  HENT:  Head: Grossly normal Eyes:  no discharge. No scleral icterus.  Neck: No  JVD, no carotid bruits  Cardiovascular: Regular rate and rhythm, no murmurs appreciated Pulmonary/Chest: Clear to auscultation bilaterally, no wheezes or rails Abdominal: Soft.  no distension.  no tenderness.  Musculoskeletal: Normal range of motion Neurological:  normal muscle tone. Coordination normal. No atrophy Skin: Skin warm and dry Psychiatric: normal affect, pleasant  Recent Labs: 07/13/2021: B Natriuretic Peptide 738.3; TSH 8.127 07/19/2021: ALT 119 07/23/2021: Hemoglobin 15.4; Magnesium 2.0; Platelets 117 08/26/2021: BUN 47; Creatinine, Ser 1.35; Potassium 3.7; Sodium 137    Lipid Panel Lab Results  Component Value Date   CHOL 113 06/22/2021   HDL 33 (L) 06/22/2021   LDLCALC 60 06/22/2021   TRIG 110 06/22/2021      Wt Readings from Last 3 Encounters:  09/16/21 177 lb (80.3 kg)  08/26/21 182 lb 9 oz (82.8 kg)  08/05/21 195 lb (88.5 kg)     ASSESSMENT AND PLAN:  Atherosclerosis of native coronary artery of native heart with stable angina pectoris ([place]) - Plan: EKG 12-Lead No further testing at this time, denies anginal symptoms Recent cardiac catheterization reviewed  Type 2 diabetes mellitus with other circulatory complication, with long-term current use of insulin ([place]) We have encouraged continued exercise, careful diet management in an effort to lose weight.  Atrial fibrillation with RVR Back in atrial fibrillation today, elevated rate with some mild shortness of breath, mild orthostasis symptoms Discussed various treatment options such as going to the emergency room for treatment versus medical management, she prefers medical management Presents today with a friend who will help We will increase amiodarone to 400 twice daily, start digoxin 0.252 doses today then 0.125 tomorrow thereafter She is on Eliquis 5 twice daily with compliance Stop losartan given low blood pressure She has been instructed for any worsening symptoms to go to the emergency room over the  weekend for IV amiodarone and possible cardioversion She will call us on Monday with heart rate numbers  Other hyperlipidemia Continue on statin  PVC (premature ventricular contraction) - Plan: EKG 12-Lead Asymptomatic  Morbid obesity We have encouraged continued exercise, careful diet management in an effort to lose weight.   Total encounter time more than 40 minutes  Greater than 50% was spent in counseling and coordination of care with the patient   No orders of the defined types were placed in this encounter.   Signed, Esmond Plants, M.D., Ph.D. 09/16/2021  [place], [place]

## 2021-09-16 NOTE — Patient Instructions (Addendum)
Call with heart rates on monday ? ?Medication Instructions:  ?Amiodarone 400 mg twice a day  ?(2 of the 200 mg pills twice a day) ? ?Start digoxin 0.25 mg (2 pills today), one at 4 pm and one at midnight,  ?Then 1/2 pill (0.'125mg'$ ) once a day starting Saturday in evening ? ?Hold the losartan until back in normal rhythm  ? ?If you need a refill on your cardiac medications before your next appointment, please call your pharmacy.  ? ?Lab work: ?Digoxin level  ?In one week ? ?Testing/Procedures: ?No new testing needed ? ?Follow-Up: ?At [facility], you and your health needs are our priority.  As part of our continuing mission to provide you with exceptional heart care, we have created designated Provider Care Teams.  These Care Teams include your primary Cardiologist (physician) and Advanced Practice Providers (APPs -  Physician Assistants and Nurse Practitioners) who all work together to provide you with the care you need, when you need it. ? ?You will need a follow up appointment in 1 week or soonest next available  ? ?Providers on your designated Care Team:   ?Murray Hodgkins, NP ?Christell Faith, PA-C ?Cadence Kathlen Mody, PA-C ? ?COVID-19 Vaccine Information can be found at: ShippingScam.co.uk For questions related to vaccine distribution or appointments, please email [email or web address] or call [phone].  ? ? ? ?

## 2021-09-16 NOTE — Telephone Encounter (Signed)
Pt c/o medication issue: ? ?1. Name of Medication: digoxin ? ?2. How are you currently taking this medication (dosage and times per day)? Please advise  ? ?3. Are you having a reaction (difficulty breathing--STAT)? no ? ?4. What is your medication issue? Pharmacy filled as 125 mcg po q d please confirm dose is correct  ? ?

## 2021-09-18 NOTE — Progress Notes (Signed)
Cardiology Office Note  Date:  09/19/2021   ID:  Michele [name], DOB Nov 01, 1955, MRN [id number]  PCP:  Jerrol Banana., MD   Chief Complaint  Patient presents with   OTHER    Follow up for medication adjustments for a-fib. Medications verbally reviewed with patient.    HPI:  Michele [name] is a  66 year old woman with past medical history of morbid obesity,  coronary artery disease with 50% proximal LAD disease , followed by a 30% lesion by catheterization July 2009,  diabetes, hemoglobin A1c, poorly controlled hyperlipidemia,  ejection fraction 30% by echocardiogram in June 2009,  moderately dilated left atrium and left ventricle,  obstructive sleep apnea and wears CPAP. Whoe presents for f/u of her  coronary artery disease, hyperlipidemia , Afib  Seen 3 days ago in clinic for atrial fibrillation with RVR, Friday Was started on amiodarone 400 twice daily Reports taking Amio 200 mg pills, 3 pills on Friday 4 pills sat and 4 pills on Sunday 2 this AM Digoxin 0.25 x2 on Friday 0.125 daily Saturday and Sunday Noted slower heart rate, regular rhythm possibly Sunday, certainly today  Reports feeling better, less short of breath  EKG on today's visit confirming normal sinus rhythm rate 80 bpm left bundle branch block  Other past medical history reviewed  07/2021, progressive weakness and dyspnea w/ finding of AFib w/ RVR.  Hospital admission to [facility] due to rapid afib and acute hypoxic resp failure.  CHF was noted on CTA chest.   PE could not be excluded.   hypotensive and required norepinephrine.   Echocardiogram on January 5 showed EF of less than 20% with global hypokinesis.  The inferior and inferolateral walls were best preserved.  The right ventricular function was moderately reduced with small pericardial effusion, and mild to moderate mitral regurgitation.    right and left heart cardiac catheterization on July 15 2021 showing moderately elevated filling pressures and  severely reduced cardiac output (2.19) and index (1.13).   70% stenosis in the mid LAD, which was not felt to be flow-limiting and continued IV diuresis with initiation of milrinone was recommended, as well as TEE cardioversion.  Dyspnea improved with diuresis and she was eventually able to be weaned off of norepinephrine.    TEE and cardioversion was successfully performed on January 10, with restoration of sinus rhythm.   Amiodarone therapy was initiated.   Milrinone was discontinued on January 11, and beta-blocker and ARB therapy were added.   transitioned to oral torsemide , discharged home on January 15 at 193 pounds.  In heart failure clinic follow-up on January 17,  torsemide dose was reduced to 40 mg daily  In follow-up today reports having some shortness of breath for the past several days, some dizziness May have been worse today, noticed elevated heart rate today Has not taken her amiodarone, metoprolol today Feels blood pressure is low  EKG personally reviewed by myself on todays visit Atrial fibrillation rate 148 bpm left bundle branch block   PMH:   has a past medical history of Abscess of breast, right (08/19/2012), Breast cancer ([place]) (07/13/2010), Coronary artery disease, DCIS (ductal carcinoma in situ) of breast, right (2012), Diabetes mellitus ([place]), GERD (gastroesophageal reflux disease), HFrEF (heart failure with reduced ejection fraction) ([place]), Hyperlipidemia, Hypertension, Ischemic cardiomyopathy, Obesity, unspecified, Persistent atrial fibrillation ([place]), Personal history of radiation therapy, Sleep apnea, Thyroid disease, and Umbilical hernia without mention of obstruction or gangrene.  PSH:    Past Surgical History:  Procedure Laterality  Date   BREAST BIOPSY Right 2007   right bx neg   BREAST BIOPSY Right 08/10/2016   fat necrosis/ done in Dr. Dwyane Luo office   BREAST EXCISIONAL BIOPSY Right 2011   DCIS mammosite lumpectomy   BREAST LUMPECTOMY Right Jan 2012    Wide excision   BREAST MASS EXCISION Right 2011   December   CARDIAC CATHETERIZATION  01/08/2008   CHOLECYSTECTOMY  2013   [facility]   HERNIA REPAIR  2013   epigastric   INCISION AND DRAINAGE BREAST ABSCESS Right 08/19/2012   RIGHT/LEFT HEART CATH AND CORONARY ANGIOGRAPHY N/A 07/15/2021   Procedure: RIGHT/LEFT HEART CATH AND CORONARY ANGIOGRAPHY;  Surgeon: [name], MD;  Location: [place] [facility];  Service: Cardiovascular;  Laterality: N/A;   TEE WITHOUT CARDIOVERSION N/A 07/19/2021   Procedure: TRANSESOPHAGEAL ECHOCARDIOGRAM (TEE);  Surgeon: Minna Merritts, MD;  Location: [facility] ORS;  Service: Cardiovascular;  Laterality: N/A;   TONSILLECTOMY AND ADENOIDECTOMY     age 66 yrs    Current Outpatient Medications  Medication Sig Dispense Refill   apixaban (ELIQUIS) 5 MG TABS tablet Take 1 tablet (5 mg total) by mouth 2 (two) times daily. 60 tablet 5   Blood Glucose Monitoring Suppl (ONE TOUCH ULTRA SYSTEM KIT) w/Device KIT Check sugar once daily. DX E11.9 1 each 0   clotrimazole-betamethasone (LOTRISONE) cream Apply 1 application topically 2 (two) times daily. 60 g 2   digoxin (LANOXIN) 0.125 MG tablet Take 1 tablet (0.125 mg total) by mouth daily. 90 tablet 3   empagliflozin (JARDIANCE) 10 MG TABS tablet Take 10 mg by mouth daily.     fluticasone (FLONASE) 50 MCG/ACT nasal spray Place into the nose.     glucose blood (ONE TOUCH ULTRA TEST) test strip Use as instructed 100 each 3   insulin glargine (LANTUS SOLOSTAR) 100 UNIT/ML Solostar Pen Inject 15 Units into the skin 2 (two) times daily. (Patient taking differently: Inject 50 Units into the skin 2 (two) times daily.) 15 mL 0   Insulin Pen Needle 32G X 4 MM MISC Inject twice daily, SQ. DX E11.9 200 each 3   Lancets (ONETOUCH ULTRASOFT) lancets Check sugar once daily DX E11.9 100 each 3   levothyroxine (SYNTHROID) 200 MCG tablet TAKE 1 TABLET(200 MCG) BY MOUTH DAILY BEFORE BREAKFAST 90 tablet 0   NON  FORMULARY CPAP AT BEDTIME     rosuvastatin (CRESTOR) 10 MG tablet TAKE 1 TABLET BY MOUTH  DAILY 90 tablet 1   Syringe/Needle, Disp, (SYRINGE 3CC/25GX1") 25G X 1" 3 ML MISC 1 mL by Does not apply route every 30 (thirty) days. 12 each 1   torsemide (DEMADEX) 20 MG tablet Take 1 tablet (20 mg total) by mouth daily. 90 tablet 3   amiodarone (PACERONE) 200 MG tablet Take 1 tablet (200 mg total) by mouth daily. 90 tablet 3   Glucagon (GVOKE HYPOPEN 1-PACK) 1 MG/0.2ML SOAJ Inject 1 mg into the skin every 15 (fifteen) minutes as needed for up to 5 doses. 1 mL 3   losartan (COZAAR) 25 MG tablet Take 0.5 tablets (12.5 mg total) by mouth daily. 45 tablet 3   metoprolol succinate (TOPROL-XL) 25 MG 24 hr tablet Take 1 tablet (25 mg total) by mouth daily. 90 tablet 3   No current facility-administered medications for this visit.     Allergies:   Entresto [sacubitril-valsartan]   Social History:  The patient  reports that she has never smoked. She has never  used smokeless tobacco. She reports that she does not drink alcohol and does not use drugs.   Family History:   family history includes Arthritis in her father; CVA in her mother; Congestive Heart Failure in her mother; Diabetes in her mother and sister; Heart attack in her father, maternal grandfather, and paternal grandmother; Heart failure in her mother; Hemochromatosis in her brother; Hypertension in her brother, brother, and sister; Lung cancer in her sister; Stroke in her maternal grandmother.    Review of Systems: Review of Systems  Constitutional: Negative.   Respiratory: Negative.    Cardiovascular: Negative.   Gastrointestinal: Negative.   Musculoskeletal: Negative.   Neurological: Negative.   Psychiatric/Behavioral: Negative.    All other systems reviewed and are negative.   PHYSICAL EXAM: VS:  BP 130/78 (BP Location: Right Arm, Patient Position: Sitting, Cuff Size: Normal)    Pulse 80    Ht '5\' 2"'  (1.575 m)    Wt 175 lb (79.4 kg)     SpO2 99%    BMI 32.01 kg/m  , BMI Body mass index is 32.01 kg/m. Constitutional:  oriented to person, place, and time. No distress.  HENT:  Head: Grossly normal Eyes:  no discharge. No scleral icterus.  Neck: No JVD, no carotid bruits  Cardiovascular: Regular rate and rhythm, no murmurs appreciated Pulmonary/Chest: Clear to auscultation bilaterally, no wheezes or rails Abdominal: Soft.  no distension.  no tenderness.  Musculoskeletal: Normal range of motion Neurological:  normal muscle tone. Coordination normal. No atrophy Skin: Skin warm and dry Psychiatric: normal affect, pleasant   Recent Labs: 07/13/2021: B Natriuretic Peptide 738.3; TSH 8.127 07/19/2021: ALT 119 07/23/2021: Hemoglobin 15.4; Magnesium 2.0; Platelets 117 08/26/2021: BUN 47; Creatinine, Ser 1.35; Potassium 3.7; Sodium 137    Lipid Panel Lab Results  Component Value Date   CHOL 113 06/22/2021   HDL 33 (L) 06/22/2021   LDLCALC 60 06/22/2021   TRIG 110 06/22/2021      Wt Readings from Last 3 Encounters:  09/19/21 175 lb (79.4 kg)  09/16/21 177 lb (80.3 kg)  08/26/21 182 lb 9 oz (82.8 kg)     ASSESSMENT AND PLAN:  Atherosclerosis of native coronary artery of native heart with stable angina pectoris (HCC) -  Currently with no symptoms of angina. No further workup at this time. Continue current medication regimen.  Type 2 diabetes mellitus with other circulatory complication, with long-term current use of insulin ([place]) We have encouraged continued exercise, careful diet management in an effort to lose weight. She reports weight slowly trending downward through dietary changes  Atrial fibrillation with RVR Back in normal sinus rhythm after amiodarone load started 3 days ago We have recommended the following changes as below Please decrease the amiodarone to 200 mg twice a day for 2 weeks Last week in March, decrease amiodarone down to only once a day Stay on the digoxin 0.125 Stay on eliquis 5 mg twice a  day Please increase the metoprolol up to 25 mg daily Restart losartan 12.5 daily  Other hyperlipidemia Continue on statin  PVC (premature ventricular contraction) - Plan: EKG 12-Lead Asymptomatic, no further work-up  Morbid obesity We have encouraged continued exercise, careful diet management in an effort to lose weight.    Total encounter time more than 30 minutes  Greater than 50% was spent in counseling and coordination of care with the patient   Orders Placed This Encounter  Procedures   EKG 12-Lead    Signed, Esmond Plants, M.D., Ph.D. 09/19/2021  [place], [place]

## 2021-09-19 ENCOUNTER — Other Ambulatory Visit: Payer: Self-pay

## 2021-09-19 ENCOUNTER — Ambulatory Visit (INDEPENDENT_AMBULATORY_CARE_PROVIDER_SITE_OTHER): Payer: Medicare Other | Admitting: Cardiovascular Disease

## 2021-09-19 ENCOUNTER — Encounter: Payer: Self-pay | Admitting: Cardiovascular Disease

## 2021-09-19 VITALS — BP 130/78 | HR 80 | Ht 62.0 in | Wt 175.0 lb

## 2021-09-19 DIAGNOSIS — I420 Dilated cardiomyopathy: Secondary | ICD-10-CM

## 2021-09-19 DIAGNOSIS — I25118 Atherosclerotic heart disease of native coronary artery with other forms of angina pectoris: Secondary | ICD-10-CM | POA: Diagnosis not present

## 2021-09-19 DIAGNOSIS — I4819 Other persistent atrial fibrillation: Secondary | ICD-10-CM

## 2021-09-19 DIAGNOSIS — I5021 Acute systolic (congestive) heart failure: Secondary | ICD-10-CM

## 2021-09-19 DIAGNOSIS — N1831 Chronic kidney disease, stage 3a: Secondary | ICD-10-CM

## 2021-09-19 DIAGNOSIS — E782 Mixed hyperlipidemia: Secondary | ICD-10-CM

## 2021-09-19 DIAGNOSIS — E1122 Type 2 diabetes mellitus with diabetic chronic kidney disease: Secondary | ICD-10-CM

## 2021-09-19 DIAGNOSIS — I10 Essential (primary) hypertension: Secondary | ICD-10-CM

## 2021-09-19 DIAGNOSIS — Z794 Long term (current) use of insulin: Secondary | ICD-10-CM

## 2021-09-19 DIAGNOSIS — I2510 Atherosclerotic heart disease of native coronary artery without angina pectoris: Secondary | ICD-10-CM | POA: Diagnosis not present

## 2021-09-19 DIAGNOSIS — I4891 Unspecified atrial fibrillation: Secondary | ICD-10-CM

## 2021-09-19 MED ORDER — LOSARTAN POTASSIUM 25 MG PO TABS: 12.5000 mg | ORAL_TABLET | Freq: Every day | ORAL | 3 refills | Status: DC

## 2021-09-19 MED ORDER — AMIODARONE HCL 200 MG PO TABS: 200.0000 mg | ORAL_TABLET | Freq: Every day | ORAL | 3 refills | Status: DC

## 2021-09-19 MED ORDER — METOPROLOL SUCCINATE ER 25 MG PO TB24: 25.0000 mg | ORAL_TABLET | Freq: Every day | ORAL | 3 refills | Status: DC

## 2021-09-19 NOTE — Patient Instructions (Addendum)
Medication Instructions:  ?Please decrease the amiodarone to 200 mg twice a day for 2 weeks ?Last week in March, decrease amiodarone down to only once a day ? ?Stay on the digoxin 0.125 ?Stay on eliquis 5 mg twice a day ?Please increase the metoprolol up to 25 mg daily ?Restart losartan 12.5 daily ? ? ?If you need a refill on your cardiac medications before your next appointment, please call your pharmacy.  ? ?Lab work: ?No new labs needed ? ?Testing/Procedures: ?No new testing needed ? ?Follow-Up: ?At [facility], you and your health needs are our priority.  As part of our continuing mission to provide you with exceptional heart care, we have created designated Provider Care Teams.  These Care Teams include your primary Cardiologist (physician) and Advanced Practice Providers (APPs -  Physician Assistants and Nurse Practitioners) who all work together to provide you with the care you need, when you need it. ? ?You will need a follow up appointment in 3 months ? ?Providers on your designated Care Team:   ?Murray Hodgkins, NP ?Christell Faith, PA-C ?Cadence Kathlen Mody, PA-C ? ?COVID-19 Vaccine Information can be found at: ShippingScam.co.uk For questions related to vaccine distribution or appointments, please email [email or web address] or call [phone].  ? ?

## 2021-09-23 ENCOUNTER — Ambulatory Visit: Payer: Medicare Other | Admitting: *Deleted

## 2021-09-23 ENCOUNTER — Other Ambulatory Visit: Payer: Self-pay

## 2021-09-23 ENCOUNTER — Other Ambulatory Visit (INDEPENDENT_AMBULATORY_CARE_PROVIDER_SITE_OTHER): Payer: Medicare Other

## 2021-09-23 DIAGNOSIS — Z79899 Other long term (current) drug therapy: Secondary | ICD-10-CM

## 2021-09-24 LAB — DIGOXIN LEVEL: Digoxin, Serum: 0.9 ng/mL (ref 0.5–0.9)

## 2021-09-26 ENCOUNTER — Ambulatory Visit: Payer: Medicare Other | Admitting: Cardiovascular Disease

## 2021-09-30 ENCOUNTER — Encounter: Payer: Medicare Other | Attending: Endocrinology | Admitting: *Deleted

## 2021-09-30 ENCOUNTER — Encounter: Payer: Self-pay | Admitting: *Deleted

## 2021-09-30 ENCOUNTER — Other Ambulatory Visit: Payer: Self-pay

## 2021-09-30 VITALS — BP 108/68 | Ht 62.0 in | Wt 175.2 lb

## 2021-09-30 DIAGNOSIS — E1159 Type 2 diabetes mellitus with other circulatory complications: Secondary | ICD-10-CM | POA: Insufficient documentation

## 2021-09-30 DIAGNOSIS — Z794 Long term (current) use of insulin: Secondary | ICD-10-CM | POA: Diagnosis not present

## 2021-09-30 NOTE — Patient Instructions (Addendum)
Check blood sugars before breakfast, before supper and at bedtime every day ?Bring blood sugar records to the next MD appointment ? ?Exercise:  Continue PT exercises for   10-15  minutes   5   days a week ? ?Eat 3 meals day,   1-2  snacks a day ?Space meals 4-6 hours apart ?Include 1 serving of protein with each meal ?Don't skip meals - eat at least 1 protein and 1 carbohydrate serving ? ?Make an eye doctor appointment ? ?Carry fast acting glucose and a snack at all times ?Rotate injection sites ? ?Call back if you want to schedule another appointment ?

## 2021-09-30 NOTE — Progress Notes (Signed)
Diabetes Self-Management Education ? ?Visit Type: First/Initial ? ?Appt. Start Time: 1315 Appt. End Time: 4401 ? ?09/30/2021 ? ?[name], identified by name and date of birth, is a 66 y.o. female with a diagnosis of Diabetes: Type 2.  ? ?ASSESSMENT ? ?Blood pressure 108/68, height '5\' 2"'$  (1.575 m), weight 175 lb 3.2 oz (79.5 kg). ?Body mass index is 32.04 kg/m?. ? ? Diabetes Self-Management Education - 09/30/21 1550   ? ?  ? Visit Information  ? Visit Type First/Initial   ?  ? Initial Visit  ? Diabetes Type Type 2   ? Are you currently following a meal plan? Yes   ? What type of meal plan do you follow? "trying to watch carbs, don't cook or eat out a often"   ? Are you taking your medications as prescribed? Yes   ? Date Diagnosed 10 years ago   ?  ? Health Coping  ? How would you rate your overall health? Fair   ?  ? Psychosocial Assessment  ? Patient Belief/Attitude about Diabetes Other (comment)   "inconvenience and frustration"  ? Self-care barriers None   ? Self-management support Doctor's office;Family   ? Other persons present Family Member   sister-in-law  ? Patient Concerns Nutrition/Meal planning;Medication;Weight Control;Monitoring;Healthy Lifestyle;Glycemic Control   ? Special Needs None   ? Preferred Learning Style Auditory;Other (comment)   talking/listening  ? Learning Readiness Change in progress   ? How often do you need to have someone help you when you read instructions, pamphlets, or other written materials from your doctor or pharmacy? 1 - Never   ? What is the last grade level you completed in school? high school   ?  ? Pre-Education Assessment  ? Patient understands the diabetes disease and treatment process. Demonstrates understanding / competency   ? Patient understands incorporating nutritional management into lifestyle. Needs Review   ? Patient undertands incorporating physical activity into lifestyle. Needs Review   ? Patient understands using medications safely. Needs Review   ?  Patient understands monitoring blood glucose, interpreting and using results Demonstrates understanding / competency   ? Patient understands prevention, detection, and treatment of acute complications. Demonstrates understanding / competency   ? Patient understands prevention, detection, and treatment of chronic complications. Demonstrates understanding / competency   ? Patient understands how to develop strategies to address psychosocial issues. Needs Review   ? Patient understands how to develop strategies to promote health/change behavior. Needs Review   ?  ? Complications  ? Last HgB A1C per patient/outside source 10 %   06/22/2021  ? How often do you check your blood sugar? 3-4 times/day   ? Fasting Blood glucose range (mg/dL) 70-129;130-179   she reports FBG's 120-140 mg/dL  ? Postprandial Blood glucose range (mg/dL) --   she reports before supper 170-180 mg/dL; bedtime 180-190 mg/dL recently and sometimes 200's mg/dL  ? Number of hypoglycemic episodes per month 3   Pt reports low blood sugar symptoms when readings are 80-90's mg/dL - usually less than 120's. She reports no symptoms since she decreased Lantus to 35 units daily  ? Can you tell when your blood sugar is low? Yes   ? What do you do if your blood sugar is low? regular soda, jelly beans, honey   ? Have you had a dilated eye exam in the past 12 months? No   ? Have you had a dental exam in the past 12 months? No   ? Are you  checking your feet? Yes   ? How many days per week are you checking your feet? 1   ?  ? Dietary Intake  ? Breakfast skips   ? Lunch cereal and milk; lunch meat; fruit (grapes, bananas, honeydew, pears, cantaloup, watermelon, pineapple)   ? Snack (afternoon) nuts   ? Dinner chicken, beef, pork, fish; potatoes, peas, beans, rice, pasta, green beans, broccoli, cauliflower, greens, squash, zucchini; salads with lettuce, tomatoes, onions, cuccumbers, eggs, cheese, sometimes tuna   ? Beverage(s) water, unsweetened tea, diet soda, Crystal  Light   ?  ? Exercise  ? Exercise Type Other (comment)   exercises from PT - they have recently finished their program  ?  ? Patient Education  ? Previous Diabetes Education Yes (please comment)   came with her husband to our classes years ago  ? Disease state  Explored patient's options for treatment of their diabetes   ? Nutrition management  Role of diet in the treatment of diabetes and the relationship between the three main macronutrients and blood glucose level;Food label reading, portion sizes and measuring food.;Carbohydrate counting;Reviewed blood glucose goals for pre and post meals and how to evaluate the patients' food intake on their blood glucose level.;Information on hints to eating out and maintain blood glucose control.   ? Physical activity and exercise  Role of exercise on diabetes management, blood pressure control and cardiac health.   ? Medications Taught/reviewed insulin injection, site rotation, insulin storage and needle disposal.;Reviewed patients medication for diabetes, action, purpose, timing of dose and side effects.   ? Monitoring Purpose and frequency of SMBG.;Taught/discussed recording of test results and interpretation of SMBG.;Identified appropriate SMBG and/or A1C goals.;Other (comment)   use of CGM's  ? Acute complications Taught treatment of hypoglycemia - the 15 rule.   ? Chronic complications Relationship between chronic complications and blood glucose control;Retinopathy and reason for yearly dilated eye exams   ? Psychosocial adjustment Identified and addressed patients feelings and concerns about diabetes   ?  ? Individualized Goals (developed by patient)  ? Reducing Risk Other (comment)   improve blood sugars, decrease medications, prevent diabetes complications, lose weight, lead a healthier lifestyle, become more fit  ?  ? Outcomes  ? Expected Outcomes Demonstrated interest in learning. Expect positive outcomes   ? Program Status Not Completed   ? ?  ?  ?Individualized  Plan for Diabetes Self-Management Training:  ? ?Learning Objective:  Patient will have a greater understanding of diabetes self-management. ?Patient education plan is to attend individual and/or group sessions per assessed needs and concerns. ?  ?Plan:  ? ?Patient Instructions  ?Check blood sugars before breakfast, before supper and at bedtime every day ?Bring blood sugar records to the next MD appointment ? ?Exercise:  Continue PT exercises for   10-15  minutes   5   days a week ? ?Eat 3 meals day,   1-2  snacks a day ?Space meals 4-6 hours apart ?Include 1 serving of protein with each meal ?Don't skip meals - eat at least 1 protein and 1 carbohydrate serving ? ?Make an eye doctor appointment ? ?Carry fast acting glucose and a snack at all times ?Rotate injection sites ? ?Call back if you want to schedule another appointment ? ?Expected Outcomes:  Demonstrated interest in learning. Expect positive outcomes ? ?Education material provided:  ?Metallurgist Guidelines ?Simple Meal Plan ?Glucose tablets ?Symptoms, causes and treatments of Hypoglycemia ?Healthy Snack Choices (ADA) ?Making Choices Using Food Labels (ADA) ?  Meal Delivery Services ? ?If problems or questions, patient to contact team via:   ?Johny Drilling, RN, [place], [place] [phone] ? ?Future DSME appointment: PRN ?The patient doesn't want to come back for further diabetes education at this time.  ?

## 2021-10-04 NOTE — Progress Notes (Signed)
?  ? ? ?Established patient visit ? ?I,[name],acting as a scribe for [name], MD.,have documented all relevant documentation on the behalf of [name], MD,as directed by  [name], MD while in the presence of [name], MD. ? ? ?Patient: Michele [name]   DOB: [birth year]   66 y.o. Female  MRN: [id number] ?Visit Date: 10/05/2021 ? ?Today's healthcare provider: [name], MD  ? ?Chief Complaint  ?Patient presents with  ? Follow-up  ? Diabetes  ? Hypertension  ? ?Subjective  ?  ?HPI  ?Very nice but noncompliant patient comes in today for follow-up.  She has now seen Dr. Ronnald Collum for her diabetes.  He is seeing cardiology for her heart disease and dilated cardiomyopathy.  EF less than 20%.Marland Kitchen ?She has diabetic neuropathy of the feet and complains of chronic neck pain which bothers her at a good bit of the time.  She still lives alone.  Widowed.  No children.  She does have some recent allergy symptoms. ?Diabetes Mellitus Type II, follow-up ? ?Lab Results  ?Component Value Date  ? HGBA1C 10.0 (A) 06/22/2021  ? HGBA1C 9.8 (H) 11/19/2019  ? HGBA1C 9.9 (A) 07/30/2018  ? ?Last seen for diabetes 3 months ago.  ?Management since then includes; continue medications. ?She reports good compliance with treatment. ?She is not having side effects. none ? ?Home blood sugar records: fasting range: 80-150 ? ?Episodes of hypoglycemia? No none ?  ?Current insulin regiment: Lantus ?Most Recent Eye Exam: past due and needs a referral ? ?--------------------------------------------------------------------------------------------------- ?Hypertension, follow-up ? ?BP Readings from Last 3 Encounters:  ?10/05/21 101/67  ?09/30/21 108/68  ?09/19/21 130/78  ? Wt Readings from Last 3 Encounters:  ?10/05/21 173 lb (78.5 kg)  ?09/30/21 175 lb 3.2 oz (79.5 kg)  ?09/19/21 175 lb (79.4 kg)  ?  ? ?She was last seen for hypertension 3 months ago.  ?BP at that visit was 110/80. ?Management since that  visit includes; on amlodipine, bystolic, losartan. ?She reports good compliance with treatment. ?She is not having side effects. none ?She is not exercising. ?She is adherent to low salt diet.   ?Outside blood pressures are 113/70. ? ?She does not smoke. ? ?Use of agents associated with hypertension: none.  ? ?--------------------------------------------------------------------------------------------------- ? ? ?Medications: ?Outpatient Medications Prior to Visit  ?Medication Sig  ? amiodarone (PACERONE) 200 MG tablet Take 1 tablet (200 mg total) by mouth daily.  ? apixaban (ELIQUIS) 5 MG TABS tablet Take 1 tablet (5 mg total) by mouth 2 (two) times daily.  ? Blood Glucose Monitoring Suppl (ONE TOUCH ULTRA SYSTEM KIT) w/Device KIT Check sugar once daily. DX E11.9  ? digoxin (LANOXIN) 0.125 MG tablet Take 1 tablet (0.125 mg total) by mouth daily. (Patient taking differently: Take 0.0625 mg by mouth daily.)  ? empagliflozin (JARDIANCE) 10 MG TABS tablet Take 10 mg by mouth daily.  ? fluticasone (FLONASE) 50 MCG/ACT nasal spray Place 2 sprays into the nose daily.  ? glucose blood (ONE TOUCH ULTRA TEST) test strip Use as instructed  ? insulin glargine (LANTUS SOLOSTAR) 100 UNIT/ML Solostar Pen Inject 15 Units into the skin 2 (two) times daily. (Patient taking differently: Inject 35 Units into the skin daily.)  ? Insulin Pen Needle 32G X 4 MM MISC Inject twice daily, SQ. DX E11.9  ? Lancets (ONETOUCH ULTRASOFT) lancets Check sugar once daily DX E11.9  ? levothyroxine (SYNTHROID) 200 MCG tablet TAKE 1 TABLET(200 MCG) BY MOUTH DAILY BEFORE BREAKFAST  ?  losartan (COZAAR) 25 MG tablet Take 0.5 tablets (12.5 mg total) by mouth daily.  ? metoprolol succinate (TOPROL-XL) 25 MG 24 hr tablet Take 1 tablet (25 mg total) by mouth daily.  ? rosuvastatin (CRESTOR) 10 MG tablet TAKE 1 TABLET BY MOUTH  DAILY  ? torsemide (DEMADEX) 20 MG tablet Take 1 tablet (20 mg total) by mouth daily.  ? NON FORMULARY CPAP AT BEDTIME (Patient not  taking: Reported on 09/30/2021)  ? ?No facility-administered medications prior to visit.  ? ? ?Review of Systems  ?Constitutional:  Negative for appetite change, chills, fatigue and fever.  ?Respiratory:  Negative for chest tightness and shortness of breath.   ?Cardiovascular:  Negative for chest pain and palpitations.  ?Gastrointestinal:  Negative for abdominal pain, nausea and vomiting.  ?Neurological:  Negative for dizziness and weakness.  ? ?Last hemoglobin A1c ?Lab Results  ?Component Value Date  ? HGBA1C 10.0 (A) 06/22/2021  ? ?  ?  Objective  ?  ?BP 101/67 (BP Location: Right Arm, Patient Position: Sitting, Cuff Size: Large)   Pulse 75   Temp 98 ?F (36.7 ?C) (Temporal)   Resp 16   Ht _0  (1.575 m)   Wt 173 lb (78.5 kg)   SpO2 99%   BMI 31.64 kg/m?  ?BP Readings from Last 3 Encounters:  ?10/05/21 101/67  ?09/30/21 108/68  ?09/19/21 130/78  ? ?Wt Readings from Last 3 Encounters:  ?10/05/21 173 lb (78.5 kg)  ?09/30/21 175 lb 3.2 oz (79.5 kg)  ?09/19/21 175 lb (79.4 kg)  ? ?  ? ?Physical Exam ?Vitals reviewed.  ?Constitutional:   ?   Appearance: She is well-developed.  ?HENT:  ?   Head: Normocephalic and atraumatic.  ?   Right Ear: External ear normal.  ?   Left Ear: External ear normal.  ?   Nose: Nose normal.  ?Eyes:  ?   General: No scleral icterus. ?   Conjunctiva/sclera: Conjunctivae normal.  ?Neck:  ?   Thyroid: No thyromegaly.  ?Cardiovascular:  ?   Rate and Rhythm: Normal rate and regular rhythm.  ?   Heart sounds: Normal heart sounds.  ?Pulmonary:  ?   Effort: Pulmonary effort is normal.  ?   Breath sounds: Normal breath sounds.  ?Abdominal:  ?   Palpations: Abdomen is soft.  ?Musculoskeletal:  ?   Right lower leg: Edema present.  ?   Left lower leg: Edema present.  ?   Comments: Trace lower extremity edema  ?Skin: ?   General: Skin is warm and dry.  ?Neurological:  ?   Mental Status: She is alert and oriented to person, place, and time.  ?Psychiatric:     ?   Behavior: Behavior normal.     ?    Thought Content: Thought content normal.     ?   Judgment: Judgment normal.  ?  ? ? ?No results found for any visits on 10/05/21. ? Assessment & Plan  ?  ? ?1. Type 2 diabetes mellitus with other circulatory complication, with long-term current use of insulin ([place]) ?I think Vania Rea is an excellent addition for her [place] management but I think compliance with treatment will be the mainstay of her improving with this. ? ?2. Primary hypertension ?Good blood pressure ? ?3. Allergic rhinitis due to other allergic trigger, unspecified seasonality ?Add loratadine. ?- Ambulatory referral to Ophthalmology ?- loratadine (CLARITIN) 10 MG tablet; Take 1 tablet (10 mg total) by mouth daily.  Dispense: 30 tablet; Refill: 5 ? ?4.  Dilated cardiomyopathy ([place]) ?Followed by cardiology.  EF less than 20%. ? ?5. Chronic systolic congestive heart failure ([place]) ? ? ?6. Atherosclerosis of native coronary artery of native heart with stable angina pectoris ([place]) ?All risk factors treated.  Patient on rosuvastatin ? ?7. OSA (obstructive sleep apnea) ?Advised to use the CPAP ? ?8. Atrial fibrillation with RVR ([place]) ?Metoprolol, digoxin, amiodarone ? ?9. Ductal carcinoma in situ (DCIS) of right breast ? ? ? ?No follow-ups on file.  ?   ? ?I, [name], MD, have reviewed all documentation for this visit. The documentation on 10/07/21 for the exam, diagnosis, procedures, and orders are all accurate and complete. ? ? ? ?Caramia Boutin Cranford Mon, MD  ?[facility] ?[phone] (phone) ?[phone] (fax) ? ? [facility] ?

## 2021-10-05 ENCOUNTER — Encounter: Payer: Self-pay | Admitting: Family Medicine

## 2021-10-05 ENCOUNTER — Ambulatory Visit (INDEPENDENT_AMBULATORY_CARE_PROVIDER_SITE_OTHER): Payer: Medicare Other | Admitting: Family Medicine

## 2021-10-05 VITALS — BP 101/67 | HR 75 | Temp 98.0°F | Resp 16 | Ht 62.0 in | Wt 173.0 lb

## 2021-10-05 DIAGNOSIS — D0511 Intraductal carcinoma in situ of right breast: Secondary | ICD-10-CM

## 2021-10-05 DIAGNOSIS — Z794 Long term (current) use of insulin: Secondary | ICD-10-CM

## 2021-10-05 DIAGNOSIS — I4891 Unspecified atrial fibrillation: Secondary | ICD-10-CM

## 2021-10-05 DIAGNOSIS — I2510 Atherosclerotic heart disease of native coronary artery without angina pectoris: Secondary | ICD-10-CM | POA: Diagnosis not present

## 2021-10-05 DIAGNOSIS — E1159 Type 2 diabetes mellitus with other circulatory complications: Secondary | ICD-10-CM

## 2021-10-05 DIAGNOSIS — G4733 Obstructive sleep apnea (adult) (pediatric): Secondary | ICD-10-CM

## 2021-10-05 DIAGNOSIS — I420 Dilated cardiomyopathy: Secondary | ICD-10-CM | POA: Diagnosis not present

## 2021-10-05 DIAGNOSIS — J3089 Other allergic rhinitis: Secondary | ICD-10-CM | POA: Diagnosis not present

## 2021-10-05 DIAGNOSIS — I25118 Atherosclerotic heart disease of native coronary artery with other forms of angina pectoris: Secondary | ICD-10-CM | POA: Diagnosis not present

## 2021-10-05 DIAGNOSIS — I10 Essential (primary) hypertension: Secondary | ICD-10-CM | POA: Diagnosis not present

## 2021-10-05 DIAGNOSIS — I5022 Chronic systolic (congestive) heart failure: Secondary | ICD-10-CM

## 2021-10-05 MED ORDER — LORATADINE 10 MG PO TABS: 10.0000 mg | ORAL_TABLET | Freq: Every day | ORAL | 5 refills | Status: DC

## 2021-10-05 NOTE — Patient Instructions (Signed)
USE ICE, HEAT, AND TOPICAL MEDICATIONS FOR NECK PAIN. ?

## 2021-10-24 ENCOUNTER — Ambulatory Visit (INDEPENDENT_AMBULATORY_CARE_PROVIDER_SITE_OTHER): Payer: Medicare Other

## 2021-10-24 VITALS — Wt 170.0 lb

## 2021-10-24 DIAGNOSIS — Z0000 Encounter for general adult medical examination without abnormal findings: Secondary | ICD-10-CM | POA: Diagnosis not present

## 2021-10-24 NOTE — Progress Notes (Signed)
Subjective:   Michele [name] is a 66 y.o. female who presents for an Initial Medicare Annual Wellness Visit.  Virtual Visit via Telephone Note  I connected with  Michele [name] on 10/24/21 at 10:30 AM EDT by telephone and verified that I am speaking with the correct person using two identifiers.  Location: Patient: Home Provider: [place] participating in the virtual visit: patient/Nurse Health Advisor   I discussed the limitations, risks, security and privacy concerns of performing an evaluation and management service by telephone and the availability of in person appointments. The patient expressed understanding and agreed to proceed.  Interactive audio and video telecommunications were attempted between this nurse and patient, however failed, due to patient having technical difficulties OR patient did not have access to video capability.  We continued and completed visit with audio only.  Some vital signs may be absent or patient reported.   Michele Lover E Elvie Maines, LPN   Review of Systems     Cardiac Risk Factors include: advanced age (>37mn, >>59women);diabetes mellitus;dyslipidemia;hypertension;sedentary lifestyle;obesity (BMI >30kg/m2);family history of premature cardiovascular disease;Other (see comment), Risk factor comments: A.Fib, CAD, atherosclerosis, CHF, OSA not using CPAP     Objective:    Today's Vitals   10/24/21 1132  Weight: 170 lb (77.1 kg)   Body mass index is 31.09 kg/m.     10/24/2021   11:45 AM 09/30/2021    1:24 PM 07/15/2021    9:53 AM 07/13/2021    6:37 PM 07/13/2021    5:08 PM 12/16/2019   11:34 AM 10/11/2015    9:34 AM  Advanced Directives  Does Patient Have a Medical Advance Directive? No No No No No No No  Type of AAcademic librarianLiving will        Copy of [place]? No - copy requested        Would patient like information on creating a medical advance directive?  No -  Patient declined No - Patient declined        Current Medications (verified) Outpatient Encounter Medications as of 10/24/2021  Medication Sig   amiodarone (PACERONE) 200 MG tablet Take 1 tablet (200 mg total) by mouth daily.   apixaban (ELIQUIS) 5 MG TABS tablet Take 1 tablet (5 mg total) by mouth 2 (two) times daily.   Blood Glucose Monitoring Suppl (ONE TOUCH ULTRA SYSTEM KIT) w/Device KIT Check sugar once daily. DX E11.9   digoxin (LANOXIN) 0.125 MG tablet Take 1 tablet (0.125 mg total) by mouth daily. (Patient taking differently: Take 0.0625 mg by mouth daily.)   empagliflozin (JARDIANCE) 10 MG TABS tablet Take 10 mg by mouth daily.   fluticasone (FLONASE) 50 MCG/ACT nasal spray Place 2 sprays into the nose daily.   glucose blood (ONE TOUCH ULTRA TEST) test strip Use as instructed   insulin glargine (LANTUS SOLOSTAR) 100 UNIT/ML Solostar Pen Inject 15 Units into the skin 2 (two) times daily. (Patient taking differently: Inject 50 Units into the skin daily.)   Insulin Pen Needle 32G X 4 MM MISC Inject twice daily, SQ. DX E11.9   Lancets (ONETOUCH ULTRASOFT) lancets Check sugar once daily DX E11.9   levothyroxine (SYNTHROID) 200 MCG tablet TAKE 1 TABLET(200 MCG) BY MOUTH DAILY BEFORE BREAKFAST   loratadine (CLARITIN) 10 MG tablet Take 1 tablet (10 mg total) by mouth daily.   losartan (COZAAR) 25 MG tablet Take 0.5 tablets (12.5 mg total) by mouth daily.   metoprolol succinate (TOPROL-XL) 25  MG 24 hr tablet Take 1 tablet (25 mg total) by mouth daily.   rosuvastatin (CRESTOR) 10 MG tablet TAKE 1 TABLET BY MOUTH  DAILY   torsemide (DEMADEX) 20 MG tablet Take 1 tablet (20 mg total) by mouth daily.   NON FORMULARY CPAP AT BEDTIME (Patient not taking: Reported on 09/30/2021)   No facility-administered encounter medications on file as of 10/24/2021.    Allergies (verified) Entresto [sacubitril-valsartan]   History: Past Medical History:  Diagnosis Date   Abscess of breast, right 08/19/2012    Breast cancer ([place]) 07/13/2010   1.5 cm,intermediate grade DCIS, nuclear grade 2, ER 90%, PR 90% treated with wide excision, reexcision to negative margins and MammoSite partial breast radiation.   Coronary artery disease    a. 2009 Cath: nonobs LAD dzs; b. 07/2021 Cath: LM nl, LAD 29m D1 mild dzs, D2 40, LCX nl, RCA 40p. PCWP 24. PA 37/26(30).   DCIS (ductal carcinoma in situ) of breast, right 2012   Diabetes mellitus ([place]    Type II   GERD (gastroesophageal reflux disease)    HFrEF (heart failure with reduced ejection fraction) [place]    a. 2009 Echo: LV dysfxn; b. 2013 Echo: EF 50-55%; c. 07/2021 Echo: EF<20%, glob HK - in/inflat moves best. Nl PASP. Sev dil LA. Mildly dil RA. Mild-mod MR.   Hyperlipidemia    Hypertension    Ischemic cardiomyopathy    Obesity, unspecified    Persistent atrial fibrillation ([facility]    a. 07/2021 s/p TEE and DCCV (150J)-->amio/eliquis (CHA2DS2VASc 6).   Personal history of radiation therapy    Sleep apnea    Thyroid disease    hypothyroidism   Umbilical hernia without mention of obstruction or gangrene    Past Surgical History:  Procedure Laterality Date   BREAST BIOPSY Right 2007   right bx neg   BREAST BIOPSY Right 08/10/2016   fat necrosis/ done in Dr. BDwyane Luooffice   BREAST EXCISIONAL BIOPSY Right 2011   DCIS mammosite lumpectomy   BREAST LUMPECTOMY Right Jan 2012   Wide excision   BREAST MASS EXCISION Right 2011   December   CARDIAC CATHETERIZATION  01/08/2008   CHOLECYSTECTOMY  2013   [facility]  HERNIA REPAIR  2013   epigastric   INCISION AND DRAINAGE BREAST ABSCESS Right 08/19/2012   RIGHT/LEFT HEART CATH AND CORONARY ANGIOGRAPHY N/A 07/15/2021   Procedure: RIGHT/LEFT HEART CATH AND CORONARY ANGIOGRAPHY;  Surgeon: AWellington Hampshire MD;  [facility];  Service: Cardiovascular;  Laterality: N/A;   TEE WITHOUT CARDIOVERSION N/A 07/19/2021   Procedure: TRANSESOPHAGEAL ECHOCARDIOGRAM (TEE);   Surgeon: GMinna Merritts MD;  Location: [facility];  Service: Cardiovascular;  Laterality: N/A;   TONSILLECTOMY AND ADENOIDECTOMY     age [age]yrs   Family History  Problem Relation Age of Onset   Heart failure Mother    Diabetes Mother    Congestive Heart Failure Mother    CVA Mother    Heart attack Father    Arthritis Father    Diabetes Sister    Hypertension Sister    Lung cancer Sister    Hypertension Brother    Hypertension Brother    Hemochromatosis Brother    Stroke Maternal Grandmother    Heart attack Maternal Grandfather    Heart attack Paternal Grandmother    Breast cancer Neg Hx    Social History   Socioeconomic History   Marital status: Widowed  Spouse name: [name]   Number of children: 0   Years of education: Not on file   Highest education level: Not on file  Occupational History   Occupation: retired    Fish farm manager: [employer]  Tobacco Use   Smoking status: Never   Smokeless tobacco: Never  Vaping Use   Vaping Use: Never used  Substance and Sexual Activity   Alcohol use: No   Drug use: No   Sexual activity: Not on file  Other Topics Concern   Not on file  Social History Narrative   Never smoked; no alcohol; lives in [place]; self;    husband passed in 2020.    Retired from [employer].    Good relationship with in-laws - they help when she needs it   Social Determinants of Radio broadcast assistant Strain: Low Risk    Difficulty of Paying Living Expenses: Not hard at all  Food Insecurity: No Food Insecurity   Worried About Charity fundraiser in the Last Year: Never true   Arboriculturist in the Last Year: Never true  Transportation Needs: No Transportation Needs   Lack of Transportation (Medical): No   Lack of Transportation (Non-Medical): No  Physical Activity: Insufficiently Active   Days of Exercise per Week: 5 days   Minutes of Exercise per Session: 20 min  Stress: No Stress Concern Present   Feeling of Stress : Not at all  Social  Connections: Socially Isolated   Frequency of Communication with Friends and Family: More than three times a week   Frequency of Social Gatherings with Friends and Family: More than three times a week   Attends Religious Services: Never   Marine scientist or Organizations: No   Attends Archivist Meetings: Never   Marital Status: Widowed    Tobacco Counseling Counseling given: Not Answered   Clinical Intake:  Pre-visit preparation completed: Yes  Pain : No/denies pain     BMI - recorded: 31.09 Nutritional Status: BMI > 30  Obese Nutritional Risks: None Diabetes: Yes CBG done?: No Did pt. bring in CBG monitor from home?: No  How often do you need to have someone help you when you read instructions, pamphlets, or other written materials from your doctor or pharmacy?: 1 - Never  Diabetic? Nutrition Risk Assessment:  Has the patient had any N/V/D within the last 2 months?  No  Does the patient have any non-healing wounds?  No  Has the patient had any unintentional weight loss or weight gain?  No   Diabetes:  Is the patient diabetic?  Yes  If diabetic, was a CBG obtained today?  No  Did the patient bring in their glucometer from home?  No  How often do you monitor your CBG's? BID - 150 this am per patient - seeing Endocrinology.   Financial Strains and Diabetes Management:  Are you having any financial strains with the device, your supplies or your medication? No .  Does the patient want to be seen by Chronic Care Management for management of their diabetes?  No  Would the patient like to be referred to a Nutritionist or for Diabetic Management?  No   Diabetic Exams:  Diabetic Eye Exam: Completed [facility] with [facility] - has appt next month at [facility]  Diabetic Foot Exam: Pt has been advised about the importance in completing this exam. Pt is scheduled for diabetic foot exam on next appt.    Interpreter Needed?: No  Information entered by ::  Joselynn Amoroso, LPN   Activities of Daily Living    10/24/2021   11:40 AM 08/26/2021    1:04 PM  In your present state of health, do you have any difficulty performing the following activities:  Hearing? 0 0  Vision? 0 0  Difficulty concentrating or making decisions? 0 0  Walking or climbing stairs? 1 0  Dressing or bathing? 0 0  Doing errands, shopping? 0 0  Preparing Food and eating ? N   Using the Toilet? N   In the past six months, have you accidently leaked urine? N   Do you have problems with loss of bowel control? N   Managing your Medications? N   Managing your Finances? N   Housekeeping or managing your Housekeeping? N     Patient Care Team: Jerrol Banana., MD as PCP - General (Family Medicine) Rockey Situ Kathlene November, MD as PCP - Cardiology (Cardiology) Neldon Labella, RN as Case Manager [place], Olevia Bowens (Orthotics)  Indicate any recent Medical Services you may have received from other than [facility] providers in the past year (date may be approximate).     Assessment:   This is a routine wellness examination for Michele [name].  Hearing/Vision screen Hearing Screening - Comments:: Denies hearing difficulties   Vision Screening - Comments:: Wears rx glasses - up to date with routine eye exams with [facility] - going to [place] next month  Dietary issues and exercise activities discussed: Current Exercise Habits: Home exercise routine, Type of exercise: stretching, Time (Minutes): 20, Frequency (Times/Week): 5, Weekly Exercise (Minutes/Week): 100, Intensity: Mild, Exercise limited by: orthopedic condition(s);cardiac condition(s)   Goals Addressed             This Visit's Progress    Exercise 150 minutes per week (moderate activity)   On track      Depression Screen    10/24/2021   11:39 AM 09/30/2021    1:24 PM 08/02/2021    2:13 PM 07/29/2021   11:00 AM 06/22/2021    1:59 PM 11/19/2019   10:11 AM 11/06/2017    1:45 PM  PHQ 2/9 Scores  PHQ - 2 Score 0  0 1 1 0 0 0  PHQ- 9 Score    '4 4 3     ' Fall Risk    10/24/2021   11:34 AM 09/30/2021    1:24 PM 08/26/2021    1:04 PM 08/02/2021    2:10 PM 07/26/2021   11:58 AM  Fall Risk   Falls in the past year? 0 0 0 0 0  Number falls in past yr: 0  0 0 0  Injury with Fall? 0  0 0 0  Risk for fall due to : Orthopedic patient;Impaired balance/gait Impaired balance/gait  Impaired balance/gait;Medication side effect Impaired balance/gait  Follow up Falls prevention discussed  Falls evaluation completed Falls prevention discussed Falls evaluation completed    FALL RISK PREVENTION PERTAINING TO THE HOME:  Any stairs in or around the home? No  If so, are there any without handrails? No  Home free of loose throw rugs in walkways, pet beds, electrical cords, etc? Yes  Adequate lighting in your home to reduce risk of falls? Yes   ASSISTIVE DEVICES UTILIZED TO PREVENT FALLS:  Life alert? No  Use of a cane, walker or w/c? Yes  Grab bars in the bathroom? Yes  Shower chair or bench in shower? Yes  Elevated toilet seat or a handicapped toilet?  Yes   TIMED UP AND GO:  Was the test performed? No . Telephonic visit  Cognitive Function:        10/24/2021   11:42 AM  6CIT Screen  What Year? 0 points  What month? 0 points  What time? 0 points  Count back from 20 0 points  Months in reverse 0 points  Repeat phrase 0 points  Total Score 0 points    Immunizations Immunization History  Administered Date(s) Administered   PNEUMOCOCCAL CONJUGATE-20 06/22/2021   Pneumococcal Polysaccharide-23 05/13/2009   Tdap 05/13/2009    TDAP status: Due, Education has been provided regarding the importance of this vaccine. Advised may receive this vaccine at local pharmacy or Health Dept. Aware to provide a copy of the vaccination record if obtained from local pharmacy or Health Dept. Verbalized acceptance and understanding.  Flu Vaccine status: Declined, Education has been provided regarding the importance of  this vaccine but patient still declined. Advised may receive this vaccine at local pharmacy or Health Dept. Aware to provide a copy of the vaccination record if obtained from local pharmacy or Health Dept. Verbalized acceptance and understanding.  Pneumococcal vaccine status: Up to date  Covid-19 vaccine status: Completed vaccines  Qualifies for Shingles Vaccine? Yes   Zostavax completed Yes   Shingrix Completed?: No.    Education has been provided regarding the importance of this vaccine. Patient has been advised to call insurance company to determine out of pocket expense if they have not yet received this vaccine. Advised may also receive vaccine at local pharmacy or Health Dept. Verbalized acceptance and understanding.  Screening Tests Health Maintenance  Topic Date Due   COVID-19 Vaccine (1) Never done   Zoster Vaccines- Shingrix (1 of 2) Never done   Fecal DNA (Cologuard)  Never done   FOOT EXAM  03/26/2018   OPHTHALMOLOGY EXAM  06/14/2018   TETANUS/TDAP  05/14/2019   DEXA SCAN  08/17/2020   HEMOGLOBIN A1C  12/21/2021   INFLUENZA VACCINE  02/07/2022   MAMMOGRAM  10/06/2022   Pneumonia Vaccine 34+ Years old  Completed   Hepatitis C Screening  Completed   HPV VACCINES  Aged Out    Health Maintenance  Health Maintenance Due  Topic Date Due   COVID-19 Vaccine (1) Never done   Zoster Vaccines- Shingrix (1 of 2) Never done   Fecal DNA (Cologuard)  Never done   FOOT EXAM  03/26/2018   OPHTHALMOLOGY EXAM  06/14/2018   TETANUS/TDAP  05/14/2019   DEXA SCAN  08/17/2020    Colorectal cancer screening: Type of screening: Colonoscopy. Completed 2012 per patient. Repeat every 10 years The surgeon she f/u about breasts is going to do her colonoscopy  Mammogram status: Ordered 2023. Pt provided with contact info and advised to call to schedule appt.  Has appt 12/12/2021  Bone Density status: Completed years ago per patient. Results reflect: Bone density results: NORMAL. Repeat every  2-5 years.  Lung Cancer Screening: (Low Dose CT Chest recommended if Age 4-80 years, 30 pack-year currently smoking OR have quit w/in 15years.) does not qualify.   Additional Screening:  Hepatitis C Screening: does qualify; Completed 09/02/2012  Vision Screening: Recommended annual ophthalmology exams for early detection of glaucoma and other disorders of the eye. Is the patient up to date with their annual eye exam?  Yes  Who is the provider or what is the name of the office in which the patient attends annual eye exams? [facility] If pt is not established  with a provider, would they like to be referred to a provider to establish care? No .   Dental Screening: Recommended annual dental exams for proper oral hygiene  Community Resource Referral / Chronic Care Management: CRR required this visit?  No   CCM required this visit?  No      Plan:     I have personally reviewed and noted the following in the patient's chart:   Medical and social history Use of alcohol, tobacco or illicit drugs  Current medications and supplements including opioid prescriptions. Patient is not currently taking opioid prescriptions. Functional ability and status Nutritional status Physical activity Advanced directives List of other physicians Hospitalizations, surgeries, and ER visits in previous 12 months Vitals Screenings to include cognitive, depression, and falls Referrals and appointments  In addition, I have reviewed and discussed with patient certain preventive protocols, quality metrics, and best practice recommendations. A written personalized care plan for preventive services as well as general preventive health recommendations were provided to patient.     Sandrea Hammond, LPN   8/82/6666   Nurse Notes: None

## 2021-10-24 NOTE — Patient Instructions (Signed)
[name] , ?Thank you for taking time to come for your Medicare Wellness Visit. I appreciate your ongoing commitment to your health goals. Please review the following plan we discussed and let me know if I can assist you in the future.  ? ?Screening recommendations/referrals: ?Colonoscopy: Due for colonoscopy - discuss with previous surgeon as we discussed ?Mammogram: Done 10/05/2020 - Recommended repeat annually - Appointment 12/12/2021 ?Bone Density: Due - this is recommended every 2 years ?Recommended yearly ophthalmology/optometry visit for glaucoma screening and checkup ?Recommended yearly dental visit for hygiene and checkup ? ?Vaccinations: ?Influenza vaccine: Declined - recommended annually in fall ?Pneumococcal vaccine: Done 05/13/2009 & 06/22/2021    ?Tdap vaccine: Done 05/13/2009 - Recommended Repeat in 10 years ?Shingles vaccine: Due - Shingrix is 2 doses 2-6 months apart and over 90% effective     ?Covid-19: Done 09/27/2019, 10/25/2019, & 05/21/2020 ? ?Advanced directives: Advance directive discussed with you today. Even though you declined this today, please call our office should you change your mind, and we can give you the proper paperwork for you to fill out.  ? ?Conditions/risks identified: Aim for 30 minutes of exercise or brisk walking, 6-8 glasses of water, and 5 servings of fruits and vegetables each day.  ? ?Next appointment: Follow up in one year for your annual wellness visit  ? ? ?Preventive Care 57 Years and Older, Female ?Preventive care refers to lifestyle choices and visits with your health care provider that can promote health and wellness. ?What does preventive care include? ?A yearly physical exam. This is also called an annual well check. ?Dental exams once or twice a year. ?Routine eye exams. Ask your health care provider how often you should have your eyes checked. ?Personal lifestyle choices, including: ?Daily care of your teeth and gums. ?Regular physical activity. ?Eating a  healthy diet. ?Avoiding tobacco and drug use. ?Limiting alcohol use. ?Practicing safe sex. ?Taking low-dose aspirin every day. ?Taking vitamin and mineral supplements as recommended by your health care provider. ?What happens during an annual well check? ?The services and screenings done by your health care provider during your annual well check will depend on your age, overall health, lifestyle risk factors, and family history of disease. ?Counseling  ?Your health care provider may ask you questions about your: ?Alcohol use. ?Tobacco use. ?Drug use. ?Emotional well-being. ?Home and relationship well-being. ?Sexual activity. ?Eating habits. ?History of falls. ?Memory and ability to understand (cognition). ?Work and work Statistician. ?Reproductive health. ?Screening  ?You may have the following tests or measurements: ?Height, weight, and BMI. ?Blood pressure. ?Lipid and cholesterol levels. These may be checked every 5 years, or more frequently if you are over 32 years old. ?Skin check. ?Lung cancer screening. You may have this screening every year starting at age 4 if you have a 30-pack-year history of smoking and currently smoke or have quit within the past 15 years. ?Fecal occult blood test (FOBT) of the stool. You may have this test every year starting at age 76. ?Flexible sigmoidoscopy or colonoscopy. You may have a sigmoidoscopy every 5 years or a colonoscopy every 10 years starting at age 25. ?Hepatitis C blood test. ?Hepatitis B blood test. ?Sexually transmitted disease (STD) testing. ?Diabetes screening. This is done by checking your blood sugar (glucose) after you have not eaten for a while (fasting). You may have this done every 1-3 years. ?Bone density scan. This is done to screen for osteoporosis. You may have this done starting at age 53. ?Mammogram. This may be  done every 1-2 years. Talk to your health care provider about how often you should have regular mammograms. ?Talk with your health care  provider about your test results, treatment options, and if necessary, the need for more tests. ?Vaccines  ?Your health care provider may recommend certain vaccines, such as: ?Influenza vaccine. This is recommended every year. ?Tetanus, diphtheria, and acellular pertussis (Tdap, Td) vaccine. You may need a Td booster every 10 years. ?Zoster vaccine. You may need this after age 25. ?Pneumococcal 13-valent conjugate (PCV13) vaccine. One dose is recommended after age 71. ?Pneumococcal polysaccharide (PPSV23) vaccine. One dose is recommended after age 11. ?Talk to your health care provider about which screenings and vaccines you need and how often you need them. ?This information is not intended to replace advice given to you by your health care provider. Make sure you discuss any questions you have with your health care provider. ?Document Released: 07/23/2015 Document Revised: 03/15/2016 Document Reviewed: 04/27/2015 ?Elsevier Interactive Patient Education ? 2017 [employer]. ? ?Fall Prevention in the Home ?Falls can cause injuries. They can happen to people of all ages. There are many things you can do to make your home safe and to help prevent falls. ?What can I do on the outside of my home? ?Regularly fix the edges of walkways and driveways and fix any cracks. ?Remove anything that might make you trip as you walk through a door, such as a raised step or threshold. ?Trim any bushes or trees on the path to your home. ?Use bright outdoor lighting. ?Clear any walking paths of anything that might make someone trip, such as rocks or tools. ?Regularly check to see if handrails are loose or broken. Make sure that both sides of any steps have handrails. ?Any raised decks and porches should have guardrails on the edges. ?Have any leaves, snow, or ice cleared regularly. ?Use sand or salt on walking paths during winter. ?Clean up any spills in your garage right away. This includes oil or grease spills. ?What can I do in the  bathroom? ?Use night lights. ?Install grab bars by the toilet and in the tub and shower. Do not use towel bars as grab bars. ?Use non-skid mats or decals in the tub or shower. ?If you need to sit down in the shower, use a plastic, non-slip stool. ?Keep the floor dry. Clean up any water that spills on the floor as soon as it happens. ?Remove soap buildup in the tub or shower regularly. ?Attach bath mats securely with double-sided non-slip rug tape. ?Do not have throw rugs and other things on the floor that can make you trip. ?What can I do in the bedroom? ?Use night lights. ?Make sure that you have a light by your bed that is easy to reach. ?Do not use any sheets or blankets that are too big for your bed. They should not hang down onto the floor. ?Have a firm chair that has side arms. You can use this for support while you get dressed. ?Do not have throw rugs and other things on the floor that can make you trip. ?What can I do in the kitchen? ?Clean up any spills right away. ?Avoid walking on wet floors. ?Keep items that you use a lot in easy-to-reach places. ?If you need to reach something above you, use a strong step stool that has a grab bar. ?Keep electrical cords out of the way. ?Do not use floor polish or wax that makes floors slippery. If you must use wax,  use non-skid floor wax. ?Do not have throw rugs and other things on the floor that can make you trip. ?What can I do with my stairs? ?Do not leave any items on the stairs. ?Make sure that there are handrails on both sides of the stairs and use them. Fix handrails that are broken or loose. Make sure that handrails are as long as the stairways. ?Check any carpeting to make sure that it is firmly attached to the stairs. Fix any carpet that is loose or worn. ?Avoid having throw rugs at the top or bottom of the stairs. If you do have throw rugs, attach them to the floor with carpet tape. ?Make sure that you have a light switch at the top of the stairs and the  bottom of the stairs. If you do not have them, ask someone to add them for you. ?What else can I do to help prevent falls? ?Wear shoes that: ?Do not have high heels. ?Have rubber bottoms. ?Are comfortable and fit you

## 2021-10-31 ENCOUNTER — Ambulatory Visit (INDEPENDENT_AMBULATORY_CARE_PROVIDER_SITE_OTHER): Payer: Medicare Other

## 2021-10-31 DIAGNOSIS — M2142 Flat foot [pes planus] (acquired), left foot: Secondary | ICD-10-CM

## 2021-10-31 DIAGNOSIS — E1142 Type 2 diabetes mellitus with diabetic polyneuropathy: Secondary | ICD-10-CM

## 2021-10-31 DIAGNOSIS — Z794 Long term (current) use of insulin: Secondary | ICD-10-CM

## 2021-10-31 DIAGNOSIS — M2141 Flat foot [pes planus] (acquired), right foot: Secondary | ICD-10-CM | POA: Diagnosis not present

## 2021-10-31 NOTE — Progress Notes (Signed)
SITUATION ?Reason for Visit: Fitting of Diabetic [place] ?Patient / Caregiver Report:  Patient is satisfied with fit and function of shoes and insoles. ? ?OBJECTIVE DATA: ?Patient History / Diagnosis:   ?  ICD-10-CM   ?1. Type 2 diabetes mellitus with diabetic polyneuropathy, with long-term current use of insulin (HCC)  E11.42   ? Z79.4   ?  ?2. Pes planus of both feet  M21.41   ? M21.42   ?  ? ? ?Change in Status:   None ? ?ACTIONS PERFORMED: ?In-Person Delivery, patient was fit with: ?- 1x pair [id number] PDAC approved prefabricated Diabetic Shoes: [employer] [place] ?- 3x pair [phone] PDAC approved vacuum formed custom diabetic insoles; [employer]: [id number] ? ?Shoes and insoles were verified for structural integrity and safety. Patient wore shoes and insoles in office. Skin was inspected and free of areas of concern after wearing shoes and inserts. Shoes and inserts fit properly. Patient / Caregiver provided with ferbal instruction and demonstration regarding donning, doffing, wear, care, proper fit, function, purpose, cleaning, and use of shoes and insoles ' and in all related precautions and risks and benefits regarding shoes and insoles. Patient / Caregiver was instructed to wear properly fitting socks with shoes at all times. Patient was also provided with verbal instruction regarding how to report any failures or malfunctions of shoes or inserts, and necessary follow up care. Patient / Caregiver was also instructed to contact physician regarding change in status that may affect function of shoes and inserts.  ? ?Patient / Caregiver verbalized undersatnding of instruction provided. Patient / Caregiver demonstrated independence with proper donning and doffing of shoes and inserts. ? ?PLAN ?Patient to follow with treating physician as recommended. Plan of care was discussed with and agreed upon by patient and/or caregiver. All questions were answered and concerns addressed. ? ?

## 2021-11-03 ENCOUNTER — Ambulatory Visit: Payer: Medicare Other | Admitting: Podiatry

## 2021-11-22 ENCOUNTER — Ambulatory Visit: Payer: Medicare Other | Attending: Family | Admitting: Family

## 2021-11-22 ENCOUNTER — Encounter: Payer: Self-pay | Admitting: Family

## 2021-11-22 VITALS — BP 113/50 | HR 70 | Resp 16 | Ht 62.0 in | Wt 170.4 lb

## 2021-11-22 DIAGNOSIS — I959 Hypotension, unspecified: Secondary | ICD-10-CM | POA: Insufficient documentation

## 2021-11-22 DIAGNOSIS — K219 Gastro-esophageal reflux disease without esophagitis: Secondary | ICD-10-CM | POA: Insufficient documentation

## 2021-11-22 DIAGNOSIS — I110 Hypertensive heart disease with heart failure: Secondary | ICD-10-CM | POA: Insufficient documentation

## 2021-11-22 DIAGNOSIS — I480 Paroxysmal atrial fibrillation: Secondary | ICD-10-CM | POA: Diagnosis not present

## 2021-11-22 DIAGNOSIS — I428 Other cardiomyopathies: Secondary | ICD-10-CM | POA: Diagnosis not present

## 2021-11-22 DIAGNOSIS — Z7901 Long term (current) use of anticoagulants: Secondary | ICD-10-CM | POA: Insufficient documentation

## 2021-11-22 DIAGNOSIS — E1122 Type 2 diabetes mellitus with diabetic chronic kidney disease: Secondary | ICD-10-CM

## 2021-11-22 DIAGNOSIS — I272 Pulmonary hypertension, unspecified: Secondary | ICD-10-CM | POA: Insufficient documentation

## 2021-11-22 DIAGNOSIS — G4730 Sleep apnea, unspecified: Secondary | ICD-10-CM | POA: Insufficient documentation

## 2021-11-22 DIAGNOSIS — I10 Essential (primary) hypertension: Secondary | ICD-10-CM | POA: Diagnosis not present

## 2021-11-22 DIAGNOSIS — Z853 Personal history of malignant neoplasm of breast: Secondary | ICD-10-CM | POA: Diagnosis not present

## 2021-11-22 DIAGNOSIS — E119 Type 2 diabetes mellitus without complications: Secondary | ICD-10-CM | POA: Insufficient documentation

## 2021-11-22 DIAGNOSIS — E785 Hyperlipidemia, unspecified: Secondary | ICD-10-CM | POA: Diagnosis not present

## 2021-11-22 DIAGNOSIS — I5022 Chronic systolic (congestive) heart failure: Secondary | ICD-10-CM | POA: Diagnosis present

## 2021-11-22 DIAGNOSIS — I2510 Atherosclerotic heart disease of native coronary artery without angina pectoris: Secondary | ICD-10-CM

## 2021-11-22 DIAGNOSIS — Z794 Long term (current) use of insulin: Secondary | ICD-10-CM

## 2021-11-22 DIAGNOSIS — N1831 Chronic kidney disease, stage 3a: Secondary | ICD-10-CM

## 2021-11-22 NOTE — Patient Instructions (Addendum)
Continue weighing daily and call for an overnight weight gain of 3 pounds or more or a weekly weight gain of more than 5 pounds. ? ? ?If you have voicemail, please make sure your mailbox is cleaned out so that we may leave a message and please make sure to listen to any voicemails.  ? ? ? ?Call us in the future for any questions/problems.  ?

## 2021-11-22 NOTE — Progress Notes (Signed)
? Patient ID: Michele [name], female    DOB: Dec 19, 1955, 66 y.o.   MRN: [id number] ? ?HPI ? ?Michele [name] is a 66 y/o female with a history of atrial fibrillation, breast cancer, CAD, DM, hyperlipidemia, HTN, thyroid disease, GERD, sleep apnea and chronic heart failure.  ? ?TEE report from 07/19/21 reviewed and showed an EF of <20% along with severe LAE, mild/moderate MR/TR and PA pressure of 32.5 mmHg. No aortic stenosis.  ? ?RHC/LHC done 07/15/21 and showed: ?  Mid LAD lesion is 70% stenosed. ?  2nd Diag lesion is 40% stenosed. ?  Prox RCA lesion is 40% stenosed.  ?1.  Significant one-vessel coronary artery disease with 70% stenosis in the mid LAD.  Sluggish TIMI II flow in all coronary arteries likely due to severely reduced cardiac output. ?2.  Left ventricular angiography was not performed due to acute kidney injury.  EF was severely reduced by echo. ?3.  Right heart catheterization showed moderately elevated filling pressures, mild pulmonary hypertension and severely reduced cardiac output.  Hemodynamics are consistent with cardiogenic shock with a cardiac output of 2.19 and cardiac index of 1.13.  ?Recommendations: ?The patient has nonischemic cardiomyopathy as the stenosis in the LAD does not explain degree of decreased ejection fraction. ?She is volume overloaded with critically low cardiac output.  I am going to start the patient on milrinone drip and start IV diuresis.  I stopped metoprolol. ?Atrial fibrillation is complicating the picture and for now we will use IV amiodarone for rate control with plans to proceed with TEE guided cardioversion early next week. ? ?Admitted 07/13/21 due to shortness of breath for the last week. CTA angio chest showed incomplete opacification of segmental branches of the right lower lobe pulmonary artery, PE could not be excluded. Antibiotics started. She became hypotensive and was transferred to ICU needing vasopressors. Placed on bipap due to increased oxygen requirement.  Cardiology / EP consults obtained. Found to be in cardiogenic shock needing milirone drip and IV lasix. Was cardioverted due to atrial fibrillation. Transitioned to oral medications. PT/OT evaluations done. Discharged after 11 days.  ? ?She presents today for a follow-up visit with a chief complaint of minimal fatigue upon moderate exertion. Describes this as chronic in nature having been present for several years although she does report that it's improving from the last time she was here. She has associated light-headedness along with this. She denies any difficulty sleeping, abdominal distention, palpitations, pedal edema, chest pain, shortness of breath, cough or weight gain.  ? ?Says that her A1c is now down to 6.7% as of 2 weeks ago ? ?Past Medical History:  ?Diagnosis Date  ? Abscess of breast, right 08/19/2012  ? Breast cancer ([place]) 07/13/2010  ? 1.5 cm,intermediate grade DCIS, nuclear grade 2, ER 90%, PR 90% treated with wide excision, reexcision to negative margins and MammoSite partial breast radiation.  ? Coronary artery disease   ? a. 2009 Cath: nonobs LAD dzs; b. 07/2021 Cath: LM nl, LAD 73m D1 mild dzs, D2 40, LCX nl, RCA 40p. PCWP 24. PA 37/26(30).  ? DCIS (ductal carcinoma in situ) of breast, right 2012  ? Diabetes mellitus ([facility]   ? Type II  ? GERD (gastroesophageal reflux disease)   ? HFrEF (heart failure with reduced ejection fraction) [place]   ? a. 2009 Echo: LV dysfxn; b. 2013 Echo: EF 50-55%; c. 07/2021 Echo: EF<20%, glob HK - in/inflat moves best. Nl PASP. Sev dil LA. Mildly dil RA. Mild-mod MR.  ? Hyperlipidemia   ?  Hypertension   ? Ischemic cardiomyopathy   ? Obesity, unspecified   ? Persistent atrial fibrillation ([place])   ? a. 07/2021 s/p TEE and DCCV (150J)-->amio/eliquis (CHA2DS2VASc 6).  ? Personal history of radiation therapy   ? Sleep apnea   ? Thyroid disease   ? hypothyroidism  ? Umbilical hernia without mention of obstruction or gangrene   ? ?Past Surgical History:  ?Procedure  Laterality Date  ? BREAST BIOPSY Right 2007  ? right bx neg  ? BREAST BIOPSY Right 08/10/2016  ? fat necrosis/ done in Dr. Dwyane Luo office  ? BREAST EXCISIONAL BIOPSY Right 2011  ? DCIS mammosite lumpectomy  ? BREAST LUMPECTOMY Right Jan 2012  ? Wide excision  ? BREAST MASS EXCISION Right 2011  ? December  ? CARDIAC CATHETERIZATION  01/08/2008  ? CHOLECYSTECTOMY  2013  ? COLONOSCOPY  2011  ? Dr. Candace Cruise- [facility]  ? HERNIA REPAIR  2013  ? epigastric  ? INCISION AND DRAINAGE BREAST ABSCESS Right 08/19/2012  ? RIGHT/LEFT HEART CATH AND CORONARY ANGIOGRAPHY N/A 07/15/2021  ? Procedure: RIGHT/LEFT HEART CATH AND CORONARY ANGIOGRAPHY;  Surgeon: [name], MD;  Location: [facility];  Service: Cardiovascular;  Laterality: N/A;  ? TEE WITHOUT CARDIOVERSION N/A 07/19/2021  ? Procedure: TRANSESOPHAGEAL ECHOCARDIOGRAM (TEE);  Surgeon: Minna Merritts, MD;  Location: [facility];  Service: Cardiovascular;  Laterality: N/A;  ? TONSILLECTOMY AND ADENOIDECTOMY    ? age 71 yrs  ? ?Family History  ?Problem Relation Age of Onset  ? Heart failure Mother   ? Diabetes Mother   ? Congestive Heart Failure Mother   ? CVA Mother   ? Heart attack Father   ? Arthritis Father   ? Diabetes Sister   ? Hypertension Sister   ? Lung cancer Sister   ? Hypertension Brother   ? Hypertension Brother   ? Hemochromatosis Brother   ? Stroke Maternal Grandmother   ? Heart attack Maternal Grandfather   ? Heart attack Paternal Grandmother   ? Breast cancer Neg Hx   ? ?Social History  ? ?Tobacco Use  ? Smoking status: Never  ? Smokeless tobacco: Never  ?Substance Use Topics  ? Alcohol use: No  ? ?Allergies  ?Allergen Reactions  ? Entresto [Sacubitril-Valsartan] Other (See Comments)  ?  hypotension  ? ?Prior to Admission medications   ?Medication Sig Start Date End Date Taking? Authorizing Provider  ?amiodarone (PACERONE) 200 MG tablet Take 1 tablet (200 mg total) by mouth daily. 09/19/21  Yes Minna Merritts, MD  ?apixaban (ELIQUIS) 5 MG TABS tablet Take  1 tablet (5 mg total) by mouth 2 (two) times daily. 08/26/21  Yes Alisa Graff, FNP  ?Blood Glucose Monitoring Suppl (ONE TOUCH ULTRA SYSTEM KIT) w/Device KIT Check sugar once daily. DX E11.9 10/18/16  Yes Jerrol Banana., MD  ?digoxin (LANOXIN) 0.125 MG tablet Take 1 tablet (0.125 mg total) by mouth daily. ?Patient taking differently: Take 0.0625 mg by mouth daily. 09/16/21  Yes Minna Merritts, MD  ?empagliflozin (JARDIANCE) 10 MG TABS tablet Take 10 mg by mouth daily.   Yes [provider]  ?fluticasone (FLONASE) 50 MCG/ACT nasal spray Place 2 sprays into the nose daily. 02/24/13  Yes [provider]  ?glucose blood (ONE TOUCH ULTRA TEST) test strip Use as instructed 12/10/17  Yes Jerrol Banana., MD  ?insulin glargine (LANTUS SOLOSTAR) 100 UNIT/ML Solostar Pen Inject 15 Units into the skin 2 (two) times daily. ?Patient taking differently: Inject 50  Units into the skin daily. 07/24/21  Yes Dessa Phi, DO  ?Insulin Pen Needle 32G X 4 MM MISC Inject twice daily, SQ. DX E11.9 11/19/19  Yes Jerrol Banana., MD  ?Lancets [employer] ULTRASOFT) lancets Check sugar once daily DX E11.9 12/10/17  Yes Jerrol Banana., MD  ?levothyroxine (SYNTHROID) 200 MCG tablet TAKE 1 TABLET(200 MCG) BY MOUTH DAILY BEFORE BREAKFAST 06/22/21  Yes Myles Gip, DO  ?losartan (COZAAR) 25 MG tablet Take 0.5 tablets (12.5 mg total) by mouth daily. 09/19/21  Yes Minna Merritts, MD  ?metoprolol succinate (TOPROL-XL) 25 MG 24 hr tablet Take 1 tablet (25 mg total) by mouth daily. 09/19/21  Yes Gollan, Kathlene November, MD  ?rosuvastatin (CRESTOR) 10 MG tablet TAKE 1 TABLET BY MOUTH  DAILY 11/28/19  Yes Gollan, Kathlene November, MD  ?torsemide (DEMADEX) 20 MG tablet Take 1 tablet (20 mg total) by mouth daily. 08/11/21  Yes Theora Gianotti, NP  ?NON FORMULARY CPAP AT BEDTIME ?Patient not taking: Reported on 09/30/2021    [provider]  ? ?Review of Systems  ?Constitutional:  Positive for fatigue  (improving). Negative for appetite change.  ?HENT:  Negative for congestion, postnasal drip and sore throat.   ?Eyes: Negative.   ?Respiratory:  Negative for cough and shortness of breath.   ?Cardiovascular:  Negativ

## 2021-11-28 ENCOUNTER — Ambulatory Visit: Payer: Medicare Other | Admitting: Podiatry

## 2021-12-01 ENCOUNTER — Encounter: Payer: Self-pay | Admitting: Podiatry

## 2021-12-01 ENCOUNTER — Ambulatory Visit (INDEPENDENT_AMBULATORY_CARE_PROVIDER_SITE_OTHER): Payer: Medicare Other | Admitting: Podiatry

## 2021-12-01 DIAGNOSIS — Z794 Long term (current) use of insulin: Secondary | ICD-10-CM

## 2021-12-01 DIAGNOSIS — M79675 Pain in left toe(s): Secondary | ICD-10-CM

## 2021-12-01 DIAGNOSIS — B351 Tinea unguium: Secondary | ICD-10-CM

## 2021-12-01 DIAGNOSIS — M79674 Pain in right toe(s): Secondary | ICD-10-CM

## 2021-12-01 DIAGNOSIS — E1142 Type 2 diabetes mellitus with diabetic polyneuropathy: Secondary | ICD-10-CM

## 2021-12-01 DIAGNOSIS — D689 Coagulation defect, unspecified: Secondary | ICD-10-CM | POA: Insufficient documentation

## 2021-12-01 NOTE — Progress Notes (Signed)
This patient returns to my office for at risk foot care.  This patient requires this care by a professional since this patient will be at risk due to having diabetes and coagulation defect.  This patient is unable to cut nails himself since the patient cannot reach his nails.These nails are painful walking and wearing shoes.  This patient presents for at risk foot care today.  General Appearance  Alert, conversant and in no acute stress.  Vascular  Dorsalis pedis and posterior tibial  pulses are weakly  palpable  bilaterally.  Capillary return is within normal limits  bilaterally. Temperature is within normal limits  bilaterally.  Neurologic  Senn-Weinstein monofilament wire test diminished bilaterally. Muscle power within normal limits bilaterally.  Nails Thick disfigured discolored nails with subungual debris  from hallux to fifth toes bilaterally. No evidence of bacterial infection or drainage bilaterally.  Orthopedic  No limitations of motion  feet .  No crepitus or effusions noted.  No bony pathology or digital deformities noted.  Skin  normotropic skin with no porokeratosis noted bilaterally.  No signs of infections or ulcers noted.     Onychomycosis  Pain in right toes  Pain in left toes  Consent was obtained for treatment procedures.   Mechanical debridement of nails 1-5  bilaterally performed with a nail nipper.  Filed with dremel without incident.    Return office visit    4 months                 Told patient to return for periodic foot care and evaluation due to potential at risk complications.   Gardiner Barefoot DPM

## 2021-12-19 NOTE — Progress Notes (Signed)
Cardiology Office Note  Date:  12/20/2021   ID:  Michele [name], DOB 08-19-1955, MRN [id number]  PCP:  Jerrol Banana., MD   Chief Complaint  Patient presents with   3 month follow up     "Doing well." Medications reviewed by the patient verbally.     HPI:  Michele [name] is a  66 year old woman with past medical history of morbid obesity,  coronary artery disease with 50% proximal LAD disease , followed by a 30% lesion by catheterization July 2009,  diabetes, hemoglobin A1c, poorly controlled hyperlipidemia,  ejection fraction 30% by echocardiogram in June 2009,  moderately dilated left atrium and left ventricle,  obstructive sleep apnea and wears CPAP. NICM Who presents for f/u of her  coronary artery disease, hyperlipidemia , Afib  Last seen in clinic by myself March 2023 Had converted out of atrial fibrillation to normal sinus rhythm on amiodarone  In follow-up today reports feeling relatively well Has gout, can not use colchicine, was taking it too frequently and it caused diarrhea Was taken off allopurinal in the hospital, NSAIDs held for renal dysfunction She is walking with a walker, slowly getting better  TSH went low, Dr. Francoise Schaumann adjusted her thyroid medication reduced from 200 ug to 150 ug  In general reports that she feels well, denies any fluid retention, weight stable, no PND orthopnea No chest pain or shortness of breath No tachypalpitations concerning for arrhythmia  On metoprolol, digoxin, Eliquis Reports she is unable to swallow potassium pills  EKG personally reviewed by myself on todays visit Normal sinus rhythm rate 68 bpm nonspecific ST abnormality  Other past medical history reviewed  07/2021, progressive weakness and dyspnea w/ finding of AFib w/ RVR.  Hospital admission to [facility] due to rapid afib and acute hypoxic resp failure.  CHF was noted on CTA chest.   PE could not be excluded.   hypotensive and required norepinephrine.    Echocardiogram on January 5 showed EF of less than 20% with global hypokinesis.  The inferior and inferolateral walls were best preserved.  The right ventricular function was moderately reduced with small pericardial effusion, and mild to moderate mitral regurgitation.    right and left heart cardiac catheterization on July 15 2021 showing moderately elevated filling pressures and severely reduced cardiac output (2.19) and index (1.13).   70% stenosis in the mid LAD, which was not felt to be flow-limiting and continued IV diuresis with initiation of milrinone was recommended, as well as TEE cardioversion.  Dyspnea improved with diuresis and she was eventually able to be weaned off of norepinephrine.    TEE and cardioversion was successfully performed on January 10, with restoration of sinus rhythm.   Amiodarone therapy was initiated.   Milrinone was discontinued on January 11, and beta-blocker and ARB therapy were added.   transitioned to oral torsemide , discharged home on January 15 at 193 pounds.  In heart failure clinic follow-up on January 17,  torsemide dose was reduced to 40 mg daily   PMH:   has a past medical history of Abscess of breast, right (08/19/2012), Breast cancer ([place]) (07/13/2010), Coronary artery disease, DCIS (ductal carcinoma in situ) of breast, right (2012), Diabetes mellitus ([place]), GERD (gastroesophageal reflux disease), HFrEF (heart failure with reduced ejection fraction) ([place]), Hyperlipidemia, Hypertension, Ischemic cardiomyopathy, Obesity, unspecified, Persistent atrial fibrillation ([place]), Personal history of radiation therapy, Sleep apnea, Thyroid disease, and Umbilical hernia without mention of obstruction or gangrene.  PSH:    Past Surgical History:  Procedure Laterality Date   BREAST BIOPSY Right 2007   right bx neg   BREAST BIOPSY Right 08/10/2016   fat necrosis/ done in Dr. Dwyane Luo office   BREAST EXCISIONAL BIOPSY Right 2011   DCIS mammosite lumpectomy    BREAST LUMPECTOMY Right Jan 2012   Wide excision   BREAST MASS EXCISION Right 2011   December   CARDIAC CATHETERIZATION  01/08/2008   CHOLECYSTECTOMY  2013   [facility]   HERNIA REPAIR  2013   epigastric   INCISION AND DRAINAGE BREAST ABSCESS Right 08/19/2012   RIGHT/LEFT HEART CATH AND CORONARY ANGIOGRAPHY N/A 07/15/2021   Procedure: RIGHT/LEFT HEART CATH AND CORONARY ANGIOGRAPHY;  Surgeon: [name], MD;  Location: [facility];  Service: Cardiovascular;  Laterality: N/A;   TEE WITHOUT CARDIOVERSION N/A 07/19/2021   Procedure: TRANSESOPHAGEAL ECHOCARDIOGRAM (TEE);  Surgeon: Minna Merritts, MD;  Location: [facility] ORS;  Service: Cardiovascular;  Laterality: N/A;   TONSILLECTOMY AND ADENOIDECTOMY     age 34 yrs    Current Outpatient Medications  Medication Sig Dispense Refill   amiodarone (PACERONE) 200 MG tablet Take 1 tablet (200 mg total) by mouth daily. 90 tablet 3   apixaban (ELIQUIS) 5 MG TABS tablet Take 1 tablet (5 mg total) by mouth 2 (two) times daily. 60 tablet 5   Blood Glucose Monitoring Suppl (ONE TOUCH ULTRA SYSTEM KIT) w/Device KIT Check sugar once daily. DX E11.9 1 each 0   digoxin (LANOXIN) 0.125 MG tablet Take 0.0625 mg by mouth daily.     empagliflozin (JARDIANCE) 10 MG TABS tablet Take 10 mg by mouth daily.     fluticasone (FLONASE) 50 MCG/ACT nasal spray Place 2 sprays into the nose daily.     glucose blood (ONE TOUCH ULTRA TEST) test strip Use as instructed 100 each 3   insulin glargine (LANTUS SOLOSTAR) 100 UNIT/ML Solostar Pen Inject 15 Units into the skin 2 (two) times daily. (Patient taking differently: Inject 50 Units into the skin daily.) 15 mL 0   Insulin Pen Needle 32G X 4 MM MISC Inject twice daily, SQ. DX E11.9 200 each 3   Lancets (ONETOUCH ULTRASOFT) lancets Check sugar once daily DX E11.9 100 each 3   levothyroxine (SYNTHROID) 150 MCG tablet Take 150 mcg by mouth every morning.     losartan (COZAAR) 25 MG tablet Take  0.5 tablets (12.5 mg total) by mouth daily. 45 tablet 3   metoprolol succinate (TOPROL-XL) 25 MG 24 hr tablet Take 1 tablet (25 mg total) by mouth daily. 90 tablet 3   NON FORMULARY CPAP AT BEDTIME     rosuvastatin (CRESTOR) 10 MG tablet TAKE 1 TABLET BY MOUTH  DAILY 90 tablet 1   torsemide (DEMADEX) 20 MG tablet Take 1 tablet (20 mg total) by mouth daily. 90 tablet 3   No current facility-administered medications for this visit.     Allergies:   Entresto [sacubitril-valsartan]   Social History:  The patient  reports that she has never smoked. She has never used smokeless tobacco. She reports that she does not drink alcohol and does not use drugs.   Family History:   family history includes Arthritis in her father; CVA in her mother; Congestive Heart Failure in her mother; Diabetes in her mother and sister; Heart attack in her father, maternal grandfather, and paternal grandmother; Heart failure in her mother; Hemochromatosis in her brother; Hypertension in her brother, brother, and sister; Lung cancer in her sister;  Stroke in her maternal grandmother.    Review of Systems: Review of Systems  Constitutional: Negative.   Respiratory: Negative.    Cardiovascular: Negative.   Gastrointestinal: Negative.   Musculoskeletal: Negative.   Neurological: Negative.   Psychiatric/Behavioral: Negative.    All other systems reviewed and are negative.    PHYSICAL EXAM: VS:  BP 110/64 (BP Location: Left Arm, Patient Position: Sitting, Cuff Size: Normal)   Pulse 68   Ht '5\' 2"'  (1.575 m)   Wt 169 lb (76.7 kg)   SpO2 95%   BMI 30.91 kg/m  , BMI Body mass index is 30.91 kg/m. Constitutional:  oriented to person, place, and time. No distress.  HENT:  Head: Grossly normal Eyes:  no discharge. No scleral icterus.  Neck: No JVD, no carotid bruits  Cardiovascular: Regular rate and rhythm, no murmurs appreciated Pulmonary/Chest: Clear to auscultation bilaterally, no wheezes or rails Abdominal:  Soft.  no distension.  no tenderness.  Musculoskeletal: Normal range of motion Neurological:  normal muscle tone. Coordination normal. No atrophy Skin: Skin warm and dry Psychiatric: normal affect, pleasant  Recent Labs: 07/13/2021: B Natriuretic Peptide 738.3; TSH 8.127 07/19/2021: ALT 119 07/23/2021: Hemoglobin 15.4; Magnesium 2.0; Platelets 117 08/26/2021: BUN 47; Creatinine, Ser 1.35; Potassium 3.7; Sodium 137    Lipid Panel Lab Results  Component Value Date   CHOL 113 06/22/2021   HDL 33 (L) 06/22/2021   LDLCALC 60 06/22/2021   TRIG 110 06/22/2021      Wt Readings from Last 3 Encounters:  12/20/21 169 lb (76.7 kg)  11/22/21 170 lb 6 oz (77.3 kg)  10/24/21 170 lb (77.1 kg)     ASSESSMENT AND PLAN:  Atherosclerosis of native coronary artery of native heart with stable angina pectoris (HCC) -  Currently with no symptoms of angina. No further workup at this time. Continue current medication regimen.  Type 2 diabetes mellitus with other circulatory complication, with long-term current use of insulin ([place]) Working with Dr. Francoise Schaumann Weight stable  Atrial fibrillation with RVR On last clinic visit March 2023 went back to normal sinus rhythm on amiodarone We will continue metoprolol succinate 25 daily, Eliquis, digoxin, amiodarone 200 daily  Other hyperlipidemia Cholesterol is at goal on the current lipid regimen. No changes to the medications were made.  Cardiomyopathy, nonischemic Prior ischemic work-up in the hospital earlier this year 2023 Recommend she add spironolactone 12.5 daily to her other medications including Jardiance, beta-blocker, losartan Recommend repeat echo in 1 month on current medications  PVC (premature ventricular contraction) - Plan: EKG 12-Lead Asymptomatic, no further work-up  Morbid obesity Weight trending down, stable    Total encounter time more than 30 minutes  Greater than 50% was spent in counseling and coordination of care with the  patient   No orders of the defined types were placed in this encounter.   Signed, Esmond Plants, M.D., Ph.D. 12/20/2021  [place], [place]

## 2021-12-20 ENCOUNTER — Ambulatory Visit (INDEPENDENT_AMBULATORY_CARE_PROVIDER_SITE_OTHER): Payer: Medicare Other | Admitting: Cardiovascular Disease

## 2021-12-20 ENCOUNTER — Encounter: Payer: Self-pay | Admitting: Cardiovascular Disease

## 2021-12-20 VITALS — BP 110/64 | HR 68 | Ht 62.0 in | Wt 169.0 lb

## 2021-12-20 DIAGNOSIS — E782 Mixed hyperlipidemia: Secondary | ICD-10-CM

## 2021-12-20 DIAGNOSIS — I2510 Atherosclerotic heart disease of native coronary artery without angina pectoris: Secondary | ICD-10-CM | POA: Diagnosis not present

## 2021-12-20 DIAGNOSIS — I5022 Chronic systolic (congestive) heart failure: Secondary | ICD-10-CM | POA: Diagnosis not present

## 2021-12-20 DIAGNOSIS — Z794 Long term (current) use of insulin: Secondary | ICD-10-CM

## 2021-12-20 DIAGNOSIS — I5021 Acute systolic (congestive) heart failure: Secondary | ICD-10-CM

## 2021-12-20 DIAGNOSIS — N1831 Chronic kidney disease, stage 3a: Secondary | ICD-10-CM

## 2021-12-20 DIAGNOSIS — I4819 Other persistent atrial fibrillation: Secondary | ICD-10-CM

## 2021-12-20 DIAGNOSIS — E1122 Type 2 diabetes mellitus with diabetic chronic kidney disease: Secondary | ICD-10-CM

## 2021-12-20 DIAGNOSIS — I480 Paroxysmal atrial fibrillation: Secondary | ICD-10-CM | POA: Diagnosis not present

## 2021-12-20 DIAGNOSIS — I10 Essential (primary) hypertension: Secondary | ICD-10-CM

## 2021-12-20 DIAGNOSIS — I420 Dilated cardiomyopathy: Secondary | ICD-10-CM

## 2021-12-20 DIAGNOSIS — I25118 Atherosclerotic heart disease of native coronary artery with other forms of angina pectoris: Secondary | ICD-10-CM

## 2021-12-20 MED ORDER — SPIRONOLACTONE 25 MG PO TABS: 12.5000 mg | ORAL_TABLET | Freq: Every day | ORAL | 3 refills | Status: DC

## 2021-12-20 NOTE — Patient Instructions (Addendum)
Medication Instructions:  Please start spironolactone 12.5 mg once a day  If you need a refill on your cardiac medications before your next appointment, please call your pharmacy.   Lab work: Your physician recommends that you return for lab work (BMP) in: 3 weeks  - [facility] go to the [facility]. You will check in at the front desk to the right as you walk into the atrium. Valet Parking is offered if needed. - No appointment needed. You may go any day between 7 am and 6 pm.    Testing/Procedures:  Your physician has requested that you have an echocardiogram in 1 month. Echocardiography is a painless test that uses sound waves to create images of your heart. It provides your doctor with information about the size and shape of your heart and how well your heart's chambers and valves are working. This procedure takes approximately one hour. There are no restrictions for this procedure.   Follow-Up: At [facility], you and your health needs are our priority.  As part of our continuing mission to provide you with exceptional heart care, we have created designated Provider Care Teams.  These Care Teams include your primary Cardiologist (physician) and Advanced Practice Providers (APPs -  Physician Assistants and Nurse Practitioners) who all work together to provide you with the care you need, when you need it.  You will need a follow up appointment in 6 months  Providers on your designated Care Team:   Murray Hodgkins, NP Christell Faith, PA-C Cadence Kathlen Mody, [place]  COVID-19 Vaccine Information can be found at: [email or web address].uk For questions related to vaccine distribution or appointments, please email [email or web address] or call [phone].

## 2022-01-05 ENCOUNTER — Ambulatory Visit
Admission: RE | Admit: 2022-01-05 | Discharge: 2022-01-05 | Disposition: A | Discharge status: Home / Self Care | Payer: Medicare Other | Source: Ambulatory Visit | Attending: General Surgery | Admitting: General Surgery

## 2022-01-05 DIAGNOSIS — Z1231 Encounter for screening mammogram for malignant neoplasm of breast: Secondary | ICD-10-CM | POA: Insufficient documentation

## 2022-01-17 ENCOUNTER — Other Ambulatory Visit
Admission: RE | Admit: 2022-01-17 | Discharge: 2022-01-17 | Disposition: A | Discharge status: Home / Self Care | Payer: Medicare Other | Attending: Cardiovascular Disease | Admitting: Cardiovascular Disease

## 2022-01-17 DIAGNOSIS — I5022 Chronic systolic (congestive) heart failure: Secondary | ICD-10-CM | POA: Insufficient documentation

## 2022-01-17 LAB — BASIC METABOLIC PANEL
Anion gap: 12 (ref 5–15)
BUN: 43 mg/dL — ABNORMAL HIGH (ref 8–23)
CO2: 23 mmol/L (ref 22–32)
Calcium: 9.5 mg/dL (ref 8.9–10.3)
Chloride: 103 mmol/L (ref 98–111)
Creatinine, Ser: 1.43 mg/dL — ABNORMAL HIGH (ref 0.44–1.00)
GFR, Estimated: 40 mL/min — ABNORMAL LOW (ref >60)
Glucose, Bld: 261 mg/dL — ABNORMAL HIGH (ref 70–99)
Potassium: 4.1 mmol/L (ref 3.5–5.1)
Sodium: 138 mmol/L (ref 135–145)

## 2022-01-24 ENCOUNTER — Ambulatory Visit (INDEPENDENT_AMBULATORY_CARE_PROVIDER_SITE_OTHER): Payer: Medicare Other

## 2022-01-24 DIAGNOSIS — I420 Dilated cardiomyopathy: Secondary | ICD-10-CM

## 2022-01-24 LAB — ECHOCARDIOGRAM COMPLETE
AR max vel: 2.56 cm2
AV Area VTI: 2.44 cm2
AV Area mean vel: 2.24 cm2
AV Mean grad: 2 mmHg
AV Peak grad: 3.8 mmHg
Ao pk vel: 0.97 m/s
Area-P 1/2: 2.58 cm2
Calc EF: 58.2 %
S' Lateral: 3.3 cm
Single Plane A2C EF: 62.8 %
Single Plane A4C EF: 52.5 %

## 2022-02-11 ENCOUNTER — Other Ambulatory Visit: Payer: Self-pay | Admitting: Family

## 2022-02-12 ENCOUNTER — Other Ambulatory Visit: Payer: Self-pay | Admitting: Nurse Practitioner

## 2022-02-13 NOTE — Telephone Encounter (Signed)
Prescription refill request for Eliquis received. Indication: Atrial Fib Last office visit: 12/20/21  Johnny Bridge MD Scr: 1.43 on 01/17/22 Age: 66 Weight: 76.7kg  Based on above findings Eliquis '5mg'$  twice daily is the appropriate dose.  Refill approved.

## 2022-02-16 ENCOUNTER — Ambulatory Visit: Payer: Self-pay

## 2022-02-16 NOTE — Chronic Care Management (AMB) (Signed)
   02/16/2022  Michele [name] 1956/07/09 [id number]  Documentation encounter created to complete case transition. The care management team will follow for care coordination as needed.  [place] [facility] [phone]

## 2022-03-16 LAB — COLOGUARD: COLOGUARD: NEGATIVE

## 2022-03-20 ENCOUNTER — Ambulatory Visit (INDEPENDENT_AMBULATORY_CARE_PROVIDER_SITE_OTHER): Payer: Medicare Other | Admitting: Family Medicine

## 2022-03-20 ENCOUNTER — Encounter: Payer: Self-pay | Admitting: Family Medicine

## 2022-03-20 VITALS — BP 132/80 | HR 74 | Resp 16 | Ht 62.0 in | Wt 169.0 lb

## 2022-03-20 DIAGNOSIS — E7849 Other hyperlipidemia: Secondary | ICD-10-CM

## 2022-03-20 DIAGNOSIS — Z853 Personal history of malignant neoplasm of breast: Secondary | ICD-10-CM

## 2022-03-20 DIAGNOSIS — Z0000 Encounter for general adult medical examination without abnormal findings: Secondary | ICD-10-CM | POA: Diagnosis not present

## 2022-03-20 DIAGNOSIS — G4733 Obstructive sleep apnea (adult) (pediatric): Secondary | ICD-10-CM

## 2022-03-20 DIAGNOSIS — I420 Dilated cardiomyopathy: Secondary | ICD-10-CM

## 2022-03-20 DIAGNOSIS — I25118 Atherosclerotic heart disease of native coronary artery with other forms of angina pectoris: Secondary | ICD-10-CM | POA: Diagnosis not present

## 2022-03-20 DIAGNOSIS — I4891 Unspecified atrial fibrillation: Secondary | ICD-10-CM

## 2022-03-20 MED ORDER — ROSUVASTATIN CALCIUM 10 MG PO TABS: 10.0000 mg | ORAL_TABLET | Freq: Every day | ORAL | 3 refills | Status: DC

## 2022-03-20 NOTE — Progress Notes (Signed)
Complete physical exam  I,Michele [name],acting as a scribe for Michele Durie, MD.,have documented all relevant documentation on the behalf of Michele Durie, MD,as directed by  Michele Durie, MD while in the presence of Michele Durie, MD.   Patient: Michele [name]   DOB: 20-Nov-1955   66 y.o. Female  MRN: [id number] Visit Date: 03/20/2022  Today's healthcare provider: [name], MD   Chief Complaint  Patient presents with   Annual Exam   Subjective    Michele [name] is a 66 y.o. female who presents today for a folloow up of chronic issues.  She reports consuming a general diet. The patient does not participate in regular exercise at present. She generally feels fairly well. She reports sleeping poorly. She does not have additional problems to discuss today.  She is slowly adjusting to being a widow.  He did do her Cologuard last week which was negative. Her EF is improved from about 20% to about 55%. She is followed by Dr. Ronnald Collum for her thyroid and her diabetes.  Last A1c was 7.1. HPI  Patient had AWV with [facility] on 10/24/2021.  Past Medical History:  Diagnosis Date   Abscess of breast, right 08/19/2012   Breast cancer ([place]) 07/13/2010   1.5 cm,intermediate grade DCIS, nuclear grade 2, ER 90%, PR 90% treated with wide excision, reexcision to negative margins and MammoSite partial breast radiation.   Coronary artery disease    a. 2009 Cath: nonobs LAD dzs; b. 07/2021 Cath: LM nl, LAD 64m D1 mild dzs, D2 40, LCX nl, RCA 40p. PCWP 24. PA 37/26(30).   DCIS (ductal carcinoma in situ) of breast, right 2012   Diabetes mellitus [place]    Type II   GERD (gastroesophageal reflux disease)    HFrEF (heart failure with reduced ejection fraction) ([facility]    a. 2009 Echo: LV dysfxn; b. 2013 Echo: EF 50-55%; c. 07/2021 Echo: EF<20%, glob HK - in/inflat moves best. Nl PASP. Sev dil LA. Mildly dil RA. Mild-mod MR.   Hyperlipidemia    Hypertension    Ischemic  cardiomyopathy    Obesity, unspecified    Persistent atrial fibrillation ([place]    a. 07/2021 s/p TEE and DCCV (150J)-->amio/eliquis (CHA2DS2VASc 6).   Personal history of radiation therapy    Sleep apnea    Thyroid disease    hypothyroidism   Umbilical hernia without mention of obstruction or gangrene    Past Surgical History:  Procedure Laterality Date   BREAST BIOPSY Right 2007   right bx neg   BREAST BIOPSY Right 08/10/2016   fat necrosis/ done in Dr. BDwyane Luooffice   BREAST EXCISIONAL BIOPSY Right 2011   DCIS mammosite lumpectomy   BREAST LUMPECTOMY Right Jan 2012   Wide excision   BREAST MASS EXCISION Right 2011   December   CARDIAC CATHETERIZATION  01/08/2008   CHOLECYSTECTOMY  2013   [facility]  HERNIA REPAIR  2013   epigastric   INCISION AND DRAINAGE BREAST ABSCESS Right 08/19/2012   RIGHT/LEFT HEART CATH AND CORONARY ANGIOGRAPHY N/A 07/15/2021   Procedure: RIGHT/LEFT HEART CATH AND CORONARY ANGIOGRAPHY;  Surgeon: AWellington Hampshire MD;  [facility];  Service: Cardiovascular;  Laterality: N/A;   TEE WITHOUT CARDIOVERSION N/A 07/19/2021   Procedure: TRANSESOPHAGEAL ECHOCARDIOGRAM (TEE);  Surgeon: GMinna Merritts MD;  Location: [facility];  Service: Cardiovascular;  Laterality: N/A;   TONSILLECTOMY AND ADENOIDECTOMY  age 84 yrs   Social History   Socioeconomic History   Marital status: Widowed    Spouse name: [name]   Number of children: 0   Years of education: Not on file   Highest education level: Not on file  Occupational History   Occupation: retired    Fish farm manager: [employer]  Tobacco Use   Smoking status: Never   Smokeless tobacco: Never  Vaping Use   Vaping Use: Never used  Substance and Sexual Activity   Alcohol use: No   Drug use: No   Sexual activity: Not on file  Other Topics Concern   Not on file  Social History Narrative   Never smoked; no alcohol; lives in [place]; self;    husband passed in 2020.     Retired from [employer].    Good relationship with in-laws - they help when she needs it   Social Determinants of Health   Financial Resource Strain: Low Risk  (10/24/2021)   Overall Financial Resource Strain (CARDIA)    Difficulty of Paying Living Expenses: Not hard at all  Food Insecurity: No Food Insecurity (10/24/2021)   Hunger Vital Sign    Worried About Running Out of Food in the Last Year: Never true    [place] in the Last Year: Never true  Transportation Needs: No Transportation Needs (10/24/2021)   PRAPARE - Hydrologist (Medical): No    Lack of Transportation (Non-Medical): No  Recent Concern: Transportation Needs - Unmet Transportation Needs (08/05/2021)   PRAPARE - Transportation    Lack of Transportation (Medical): Yes    Lack of Transportation (Non-Medical): Yes  Physical Activity: Insufficiently Active (10/24/2021)   Exercise Vital Sign    Days of Exercise per Week: 5 days    Minutes of Exercise per Session: 20 min  Stress: No Stress Concern Present (10/24/2021)   [place]    Feeling of Stress : Not at all  Social Connections: Socially Isolated (10/24/2021)   Social Connection and Isolation Panel [NHANES]    Frequency of Communication with Friends and Family: More than three times a week    Frequency of Social Gatherings with Friends and Family: More than three times a week    Attends Religious Services: Never    Marine scientist or Organizations: No    Attends Archivist Meetings: Never    Marital Status: Widowed  Intimate Partner Violence: Not At Risk (10/24/2021)   Humiliation, Afraid, Rape, and Kick questionnaire    Fear of Current or Ex-Partner: No    Emotionally Abused: No    Physically Abused: No    Sexually Abused: No   Family Status  Relation Name Status   Mother  Deceased   Father  Deceased       cardiac arrest   Sister  Deceased    Brother  Deceased   Brother  Alive   MGM  (Not Specified)   MGF  (Not Specified)   PGM  (Not Specified)   Neg Hx  (Not Specified)   Family History  Problem Relation Age of Onset   Heart failure Mother    Diabetes Mother    Congestive Heart Failure Mother    CVA Mother    Heart attack Father    Arthritis Father    Diabetes Sister    Hypertension Sister    Lung cancer Sister    Hypertension Brother  Hypertension Brother    Hemochromatosis Brother    Stroke Maternal Grandmother    Heart attack Maternal Grandfather    Heart attack Paternal Grandmother    Breast cancer Neg Hx    Allergies  Allergen Reactions   Entresto [Sacubitril-Valsartan] Other (See Comments)    hypotension    Patient Care Team: Jerrol Banana., MD as PCP - General (Family Medicine) Minna Merritts, MD as PCP - Cardiology (Cardiology) Little, Olevia Bowens (Inactive) (Orthotics)   Medications: Outpatient Medications Prior to Visit  Medication Sig   amiodarone (PACERONE) 200 MG tablet Take 1 tablet (200 mg total) by mouth daily.   Blood Glucose Monitoring Suppl (ONE TOUCH ULTRA SYSTEM KIT) w/Device KIT Check sugar once daily. DX E11.9   digoxin (LANOXIN) 0.125 MG tablet Take 0.0625 mg by mouth daily.   ELIQUIS 5 MG TABS tablet TAKE 1 TABLET BY MOUTH TWICE A DAY   empagliflozin (JARDIANCE) 10 MG TABS tablet Take 10 mg by mouth daily.   fluticasone (FLONASE) 50 MCG/ACT nasal spray Place 2 sprays into the nose daily.   glucose blood (ONE TOUCH ULTRA TEST) test strip Use as instructed   insulin glargine (LANTUS SOLOSTAR) 100 UNIT/ML Solostar Pen Inject 15 Units into the skin 2 (two) times daily. (Patient taking differently: Inject 50 Units into the skin daily.)   Insulin Pen Needle 32G X 4 MM MISC Inject twice daily, SQ. DX E11.9   Lancets (ONETOUCH ULTRASOFT) lancets Check sugar once daily DX E11.9   levothyroxine (SYNTHROID) 150 MCG tablet Take 150 mcg by mouth every morning.   losartan (COZAAR) 25  MG tablet Take 0.5 tablets (12.5 mg total) by mouth daily.   metoprolol succinate (TOPROL-XL) 25 MG 24 hr tablet Take 1 tablet (25 mg total) by mouth daily.   NON FORMULARY CPAP AT BEDTIME   rosuvastatin (CRESTOR) 10 MG tablet TAKE 1 TABLET BY MOUTH  DAILY   spironolactone (ALDACTONE) 25 MG tablet Take 0.5 tablets (12.5 mg total) by mouth daily.   torsemide (DEMADEX) 20 MG tablet Take 1 tablet (20 mg total) by mouth daily.   [DISCONTINUED] insulin glargine (LANTUS SOLOSTAR) 100 UNIT/ML Solostar Pen 50 Units once daily (Patient not taking: Reported on 03/20/2022)   No facility-administered medications prior to visit.    Review of Systems  HENT:  Positive for congestion, postnasal drip and sneezing.   Gastrointestinal:  Positive for constipation.  Endocrine: Positive for polyuria.  Musculoskeletal:  Positive for arthralgias.  Neurological:  Positive for light-headedness.  All other systems reviewed and are negative.   Last thyroid functions Lab Results  Component Value Date   TSH 8.127 (H) 07/13/2021      Objective     BP 132/80 (BP Location: Left Arm, Patient Position: Sitting, Cuff Size: Normal)   Pulse 74   Resp 16   Ht $R'5\' 2"'hR$  (1.575 m)   Wt 169 lb (76.7 kg)   SpO2 99%   BMI 30.91 kg/m  BP Readings from Last 3 Encounters:  03/20/22 132/80  12/20/21 110/64  11/22/21 (!) 113/50   Wt Readings from Last 3 Encounters:  03/20/22 169 lb (76.7 kg)  12/20/21 169 lb (76.7 kg)  11/22/21 170 lb 6 oz (77.3 kg)       Physical Exam Vitals reviewed.  Constitutional:      Appearance: She is well-developed.  HENT:     Head: Normocephalic and atraumatic.     Right Ear: External ear normal.     Left Ear: External ear  normal.     Nose: Nose normal.  Eyes:     General: No scleral icterus.    Conjunctiva/sclera: Conjunctivae normal.  Neck:     Thyroid: No thyromegaly.  Cardiovascular:     Rate and Rhythm: Normal rate and regular rhythm.     Heart sounds: Normal heart sounds.   Pulmonary:     Effort: Pulmonary effort is normal.     Breath sounds: Normal breath sounds.  Abdominal:     Palpations: Abdomen is soft.  Musculoskeletal:     Right lower leg: Edema present.     Left lower leg: Edema present.     Comments: Trace lower extremity edema  Skin:    General: Skin is warm and dry.  Neurological:     Mental Status: She is alert and oriented to person, place, and time.  Psychiatric:        Behavior: Behavior normal.        Thought Content: Thought content normal.        Judgment: Judgment normal.       Last depression screening scores    03/20/2022    2:19 PM 11/22/2021    1:13 PM 10/24/2021   11:39 AM  PHQ 2/9 Scores  PHQ - 2 Score 0 0 0  PHQ- 9 Score 4     Last fall risk screening    03/20/2022    2:19 PM  Fall Risk   Falls in the past year? 0  Number falls in past yr: 0  Injury with Fall? 0  Risk for fall due to : No Fall Risks  Follow up Falls evaluation completed   Last Audit-C alcohol use screening    03/20/2022    2:19 PM  Alcohol Use Disorder Test (AUDIT)  1. How often do you have a drink containing alcohol? 0  2. How many drinks containing alcohol do you have on a typical day when you are drinking? 0  3. How often do you have six or more drinks on one occasion? 0  AUDIT-C Score 0   A score of 3 or more in women, and 4 or more in men indicates increased risk for alcohol abuse, EXCEPT if all of the points are from question 1   No results found for any visits on 03/20/22.  Assessment & Plan    Routine Health Maintenance and Physical Exam  Exercise Activities and Dietary recommendations  Goals      Exercise 150 minutes per week (moderate activity)     Peak Blood Glucose < 180        Immunization History  Administered Date(s) Administered   PNEUMOCOCCAL CONJUGATE-20 06/22/2021   Pneumococcal Polysaccharide-23 05/13/2009   Tdap 05/13/2009    Health Maintenance  Topic Date Due   COVID-19 Vaccine (1) Never done    Diabetic kidney evaluation - Urine ACR  Never done   Zoster Vaccines- Shingrix (1 of 2) Never done   Fecal DNA (Cologuard)  Never done   OPHTHALMOLOGY EXAM  06/14/2018   TETANUS/TDAP  05/14/2019   DEXA SCAN  08/17/2020   HEMOGLOBIN A1C  12/21/2021   INFLUENZA VACCINE  Never done   FOOT EXAM  12/02/2022   Diabetic kidney evaluation - GFR measurement  01/18/2023   MAMMOGRAM  01/06/2024   Pneumonia Vaccine 61+ Years old  Completed   Hepatitis C Screening  Completed   HPV VACCINES  Aged Out    Discussed health benefits of physical activity, and encouraged her to engage in  regular exercise appropriate for her age and condition.  1. Annual physical exam She is up-to-date on Cologuard.  Mammogram done.  2. Other hyperlipidemia Crestor - rosuvastatin (CRESTOR) 10 MG tablet; Take 1 tablet (10 mg total) by mouth daily.  Dispense: 90 tablet; Refill: 3  3. Dilated cardiomyopathy ([place]) EF recently is improved from 20 to 55%  4. Atherosclerosis of native coronary artery of native heart with stable angina pectoris ([place]) All risk factors treated  5. OSA (obstructive sleep apnea)   6. Morbid obesity ([place]) Patient working on weight loss.  7. History of breast cancer   8. Atrial fibrillation with RVR (H[facility]) On Eliquis   No follow-ups on file.     I, Michele Durie, MD, have reviewed all documentation for this visit. The documentation on 03/23/22 for the exam, diagnosis, procedures, and orders are all accurate and complete.    Kylii Ennis Cranford Mon, MD  [facility] [phone] (phone) [phone] (fax)  [place]

## 2022-03-28 NOTE — Addendum Note (Signed)
Addended by: Eulas Post on: 03/28/2022 07:52 AM   Modules accepted: Level of Service

## 2022-04-03 ENCOUNTER — Ambulatory Visit: Payer: Medicare Other | Admitting: Podiatry

## 2022-04-06 ENCOUNTER — Ambulatory Visit (INDEPENDENT_AMBULATORY_CARE_PROVIDER_SITE_OTHER): Payer: Medicare Other | Admitting: Podiatry

## 2022-04-06 ENCOUNTER — Encounter: Payer: Self-pay | Admitting: Podiatry

## 2022-04-06 DIAGNOSIS — M79675 Pain in left toe(s): Secondary | ICD-10-CM | POA: Diagnosis not present

## 2022-04-06 DIAGNOSIS — B351 Tinea unguium: Secondary | ICD-10-CM

## 2022-04-06 DIAGNOSIS — M79674 Pain in right toe(s): Secondary | ICD-10-CM

## 2022-04-06 DIAGNOSIS — D689 Coagulation defect, unspecified: Secondary | ICD-10-CM

## 2022-04-06 DIAGNOSIS — E1142 Type 2 diabetes mellitus with diabetic polyneuropathy: Secondary | ICD-10-CM

## 2022-04-06 DIAGNOSIS — Z794 Long term (current) use of insulin: Secondary | ICD-10-CM

## 2022-04-06 NOTE — Progress Notes (Signed)
This patient returns to my office for at risk foot care.  This patient requires this care by a professional since this patient will be at risk due to having diabetes and coagulation defect.  This patient is unable to cut nails herself since the patient cannot reach her nails.These nails are painful walking and wearing shoes.  This patient presents for at risk foot care today.  General Appearance  Alert, conversant and in no acute stress.  Vascular  Dorsalis pedis and posterior tibial  pulses are weakly  palpable  bilaterally.  Capillary return is within normal limits  bilaterally. Temperature is within normal limits  bilaterally.  Neurologic  Senn-Weinstein monofilament wire test diminished bilaterally. Muscle power within normal limits bilaterally.  Nails Thick disfigured discolored nails with subungual debris  from hallux to fifth toes bilaterally. No evidence of bacterial infection or drainage bilaterally.  Orthopedic  No limitations of motion  feet .  No crepitus or effusions noted.  No bony pathology or digital deformities noted.  Skin  normotropic skin with no porokeratosis noted bilaterally.  No signs of infections or ulcers noted.     Onychomycosis  Pain in right toes  Pain in left toes  Consent was obtained for treatment procedures.   Mechanical debridement of nails 1-5  bilaterally performed with a nail nipper.  Filed with dremel without incident.    Return office visit    4 months                 Told patient to return for periodic foot care and evaluation due to potential at risk complications.   Jaquarious Grey DPM   

## 2022-04-27 ENCOUNTER — Other Ambulatory Visit: Payer: Self-pay | Admitting: Nurse Practitioner

## 2022-06-07 LAB — HM DIABETES EYE EXAM

## 2022-06-09 ENCOUNTER — Encounter: Payer: Self-pay | Admitting: Family Medicine

## 2022-06-21 ENCOUNTER — Encounter: Payer: Self-pay | Admitting: Nurse Practitioner

## 2022-06-21 ENCOUNTER — Ambulatory Visit: Payer: Medicare Other | Attending: Nurse Practitioner | Admitting: Nurse Practitioner

## 2022-06-21 VITALS — BP 120/60 | HR 56 | Ht 62.0 in | Wt 168.2 lb

## 2022-06-21 DIAGNOSIS — I2510 Atherosclerotic heart disease of native coronary artery without angina pectoris: Secondary | ICD-10-CM | POA: Diagnosis not present

## 2022-06-21 DIAGNOSIS — I5022 Chronic systolic (congestive) heart failure: Secondary | ICD-10-CM | POA: Diagnosis not present

## 2022-06-21 DIAGNOSIS — I428 Other cardiomyopathies: Secondary | ICD-10-CM

## 2022-06-21 DIAGNOSIS — E782 Mixed hyperlipidemia: Secondary | ICD-10-CM | POA: Diagnosis present

## 2022-06-21 DIAGNOSIS — I10 Essential (primary) hypertension: Secondary | ICD-10-CM

## 2022-06-21 DIAGNOSIS — E1122 Type 2 diabetes mellitus with diabetic chronic kidney disease: Secondary | ICD-10-CM

## 2022-06-21 DIAGNOSIS — I25118 Atherosclerotic heart disease of native coronary artery with other forms of angina pectoris: Secondary | ICD-10-CM

## 2022-06-21 DIAGNOSIS — I480 Paroxysmal atrial fibrillation: Secondary | ICD-10-CM | POA: Diagnosis not present

## 2022-06-21 DIAGNOSIS — Z794 Long term (current) use of insulin: Secondary | ICD-10-CM | POA: Diagnosis present

## 2022-06-21 DIAGNOSIS — N1831 Chronic kidney disease, stage 3a: Secondary | ICD-10-CM

## 2022-06-21 NOTE — Progress Notes (Signed)
Office Visit    Patient Name: Michele [name] Date of Encounter: 06/21/2022  Primary Care Provider:  Jerrol Banana., MD Primary Cardiologist:  Ida Rogue, MD  Chief Complaint    66 year old female with a history of nonobstructive CAD, HFrEF, nonischemic cardiomyopathy, type 2 diabetes mellitus, hyperlipidemia, paroxysmal atrial fibrillation, and sleep apnea, presents for follow-up related to CHF and atrial fibrillation.  Past Medical History    Past Medical History:  Diagnosis Date   Abscess of breast, right 08/19/2012   Breast cancer ([place]) 07/13/2010   1.5 cm,intermediate grade DCIS, nuclear grade 2, ER 90%, PR 90% treated with wide excision, reexcision to negative margins and MammoSite partial breast radiation.   Coronary artery disease    a. 2009 Cath: nonobs LAD dzs; b. 07/2021 Cath: LM nl, LAD 64m D1 mild dzs, D2 40, LCX nl, RCA 40p. PCWP 24. PA 37/26(30).   DCIS (ductal carcinoma in situ) of breast, right 2012   Diabetes mellitus ([facility]    Type II   GERD (gastroesophageal reflux disease)    HFrEF (heart failure with reduced ejection fraction) ([facility]    a. 2009 Echo: LV dysfxn; b. 2013 Echo: EF 50-55%; c. 07/2021 Echo: EF<20%, glob HK - in/inflat moves best. Nl PASP. Sev dil LA. Mildly dil RA. Mild-mod MR; d. 01/2022 Echo: EF 50-55%, no rwma, mod LVH, GrI DD, nl RV fxn.   Hyperlipidemia    Hypertension    Ischemic cardiomyopathy    a. 2009 Echo: LV dysfxn; b. 2013 Echo: EF 50-55%; c. 07/2021 Echo: EF<20% d. 01/2022 Echo: EF 50-55%, no rwma, mod LVH, GrI DD, nl RV fxn.   Obesity, unspecified    Persistent atrial fibrillation ([facility] [place]    a. 07/2021 s/p TEE and DCCV (150J)-->amio/eliquis (CHA2DS2VASc 6).   Personal history of radiation therapy    Sleep apnea    Thyroid disease    hypothyroidism   Umbilical hernia without mention of obstruction or gangrene    Past Surgical History:  Procedure Laterality Date   BREAST BIOPSY Right 2007   right bx neg   BREAST  BIOPSY Right 08/10/2016   fat necrosis/ done in Dr. BDwyane Luooffice   BREAST EXCISIONAL BIOPSY Right 2011   DCIS mammosite lumpectomy   BREAST LUMPECTOMY Right Jan 2012   Wide excision   BREAST MASS EXCISION Right 2011   December   CARDIAC CATHETERIZATION  01/08/2008   CHOLECYSTECTOMY  2013   [facility]  HERNIA REPAIR  2013   epigastric   INCISION AND DRAINAGE BREAST ABSCESS Right 08/19/2012   RIGHT/LEFT HEART CATH AND CORONARY ANGIOGRAPHY N/A 07/15/2021   Procedure: RIGHT/LEFT HEART CATH AND CORONARY ANGIOGRAPHY;  Surgeon: AWellington Hampshire MD;  [facility];  Service: Cardiovascular;  Laterality: N/A;   TEE WITHOUT CARDIOVERSION N/A 07/19/2021   Procedure: TRANSESOPHAGEAL ECHOCARDIOGRAM (TEE);  Surgeon: GMinna Merritts MD;  Location: [facility] ORS;  Service: Cardiovascular;  Laterality: N/A;   TONSILLECTOMY AND ADENOIDECTOMY     age [age]yrs    Allergies  Allergies  Allergen Reactions   Entresto [Sacubitril-Valsartan] Other (See Comments)    hypotension    History of Present Illness    66year old female with a history of nonobstructive CAD, HFrEF, nonischemic cardiomyopathy, type 2 diabetes mellitus, hyperlipidemia, paroxysmal atrial fibrillation, and sleep apnea.  She initially underwent diagnostic catheterization in June 2009 in the setting of LV dysfunction, and this showed nonobstructive LAD disease.  Repeat echo in  2013, showed improved EF to 50-55%.  In January 2023, she was seen by her primary care provider with complaints of progressive weakness and dyspnea, with finding of A-fib with RVR.  She was admitted to [facility] due to rapid A-fib and hypoxic respiratory failure.  CHF was noted on CTA of the chest.  PE cannot be excluded.  She was hypotensive and required norepinephrine.  Echo on July 14, 2021 showed an EF of less than 20% with global hypokinesis.  The inferior and inferolateral walls were best preserved.  RV function was  moderately reduced with small pericardial effusion, and mild to moderate MR was also noted.  She underwent right and left heart cardiac catheterization showing moderately elevated filling pressures and severely reduced cardiac output (2.19) and index (1.13).  She also had a 70% stenosis in the mLAD, which was not felt to be flow-limiting.  She subsequently was placed on milrinone with aggressive IV diuresis and also underwent TEE and cardioversion in the setting of ongoing A-fib.  Amiodarone therapy was initiated post cardioversion.  She was transition from ARB to [facility] therapy in the outpatient setting in late January but experienced orthostasis and transition back to low-dose losartan.  At March cardiology follow-up, she was noted to be back in rapid atrial fibrillation at 148 bpm.  Amiodarone was increased to 400 mg twice daily and she was also placed on digoxin.  She was noted to be back in sinus rhythm 3 days later amiodarone dose was adjusted.  [name] was last seen in cardiology clinic in June 2023, at which time she was feeling well.  Spironolactone therapy was added with stable creatinine and potassium on follow-up labs in July.  Since then, she has done quite well.  She has been followed closely by her primary care provider in the setting of a drop in her TSH and starting amiodarone therapy necessitating reduction in levothyroxine dosing.  She is scheduled for additional follow-up labs with primary care next week.  She denies chest pain or dyspnea and has not had any persistent palpitations or suggestion that she has had recurrent atrial fibrillation.  Every once in a while, she notes that he skipped beat or flutter but again, nothing persistent.  She denies PND, orthopnea, dizziness, syncope, edema, or early satiety.  Home Medications    Current Outpatient Medications  Medication Sig Dispense Refill   amiodarone (PACERONE) 200 MG tablet Take 1 tablet (200 mg total) by mouth daily. 90  tablet 3   Blood Glucose Monitoring Suppl (ONE TOUCH ULTRA SYSTEM KIT) w/Device KIT Check sugar once daily. DX E11.9 1 each 0   digoxin (LANOXIN) 0.125 MG tablet Take 0.0625 mg by mouth daily.     ELIQUIS 5 MG TABS tablet TAKE 1 TABLET BY MOUTH TWICE A DAY 60 tablet 5   empagliflozin (JARDIANCE) 10 MG TABS tablet Take 10 mg by mouth daily.     fluticasone (FLONASE) 50 MCG/ACT nasal spray Place 2 sprays into the nose daily.     glucose blood (ONE TOUCH ULTRA TEST) test strip Use as instructed 100 each 3   insulin glargine (LANTUS SOLOSTAR) 100 UNIT/ML Solostar Pen Inject 15 Units into the skin 2 (two) times daily. (Patient taking differently: Inject 50 Units into the skin daily.) 15 mL 0   Insulin Pen Needle 32G X 4 MM MISC Inject twice daily, SQ. DX E11.9 200 each 3   Lancets (ONETOUCH ULTRASOFT) lancets Check sugar once daily DX E11.9 100 each 3  levothyroxine (SYNTHROID) 150 MCG tablet Take 150 mcg by mouth every morning.     losartan (COZAAR) 25 MG tablet Take 0.5 tablets (12.5 mg total) by mouth daily. 45 tablet 3   metoprolol succinate (TOPROL-XL) 25 MG 24 hr tablet Take 1 tablet (25 mg total) by mouth daily. 90 tablet 3   NON FORMULARY CPAP AT BEDTIME     rosuvastatin (CRESTOR) 10 MG tablet Take 1 tablet (10 mg total) by mouth daily. 90 tablet 3   spironolactone (ALDACTONE) 25 MG tablet Take 0.5 tablets (12.5 mg total) by mouth daily. 45 tablet 3   torsemide (DEMADEX) 20 MG tablet Take 1 tablet (20 mg total) by mouth daily. 90 tablet 3   No current facility-administered medications for this visit.     Review of Systems    Occasional skipped beat or flutter but no persistent palpitations.  She denies chest pain, dyspnea, PND, orthopnea, dizziness, syncope, edema, or early satiety.  All other systems reviewed and are otherwise negative except as noted above.    Physical Exam    VS:  BP 120/60 (BP Location: Left Arm, Patient Position: Sitting, Cuff Size: Normal)   Pulse (!) 56   Ht  5' 2" (1.575 m)   Wt 168 lb 3.2 oz (76.3 kg)   SpO2 99%   BMI 30.76 kg/m  , BMI Body mass index is 30.76 kg/m.     GEN: Well nourished, well developed, in no acute distress. HEENT: normal. Neck: Supple, no JVD, carotid bruits, or masses. Cardiac: RRR, no murmurs, rubs, or gallops. No clubbing, cyanosis, edema.  Radials 2+/PT 2+ and equal bilaterally.  Respiratory:  Respirations regular and unlabored, clear to auscultation bilaterally. GI: Soft, nontender, nondistended, BS + x 4. MS: no deformity or atrophy. Skin: warm and dry, no rash. Neuro:  Strength and sensation are intact. Psych: Normal affect.  Accessory Clinical Findings    ECG personally reviewed by me today -sinus bradycardia, 56, first-degree AV block, IVCD, baseline artifact with nonspecific ST and T changes- no acute changes.  Lab Results  Component Value Date   WBC 12.8 (H) 07/23/2021   HGB 15.4 (H) 07/23/2021   HCT 46.6 (H) 07/23/2021   MCV 96.1 07/23/2021   PLT 117 (L) 07/23/2021   Lab Results  Component Value Date   CREATININE 1.43 (H) 01/17/2022   BUN 43 (H) 01/17/2022   NA 138 01/17/2022   K 4.1 01/17/2022   CL 103 01/17/2022   CO2 23 01/17/2022   Lab Results  Component Value Date   ALT 119 (H) 07/19/2021   AST 84 (H) 07/19/2021   ALKPHOS 131 (H) 07/19/2021   BILITOT 0.9 07/19/2021   Lab Results  Component Value Date   CHOL 113 06/22/2021   HDL 33 (L) 06/22/2021   LDLCALC 60 06/22/2021   TRIG 110 06/22/2021   CHOLHDL 3.4 06/22/2021    Lab Results  Component Value Date   HGBA1C 10.0 (A) 06/22/2021    Assessment & Plan    1.  Chronic HFimpEF/NICM: Previous EF of less than 20% in the setting of atrial fibrillation in January 2023.  Diagnostic catheterization at that time showed moderate, nonobstructive LAD, second diagonal, and RCA disease.  She subsequently underwent TEE and cardioversion with repeat echo in July 2023 showing an improvement in EF to 50-55% with moderate LVH and grade 1  diastolic dysfunction.  She has done well over the past 6 months and is euvolemic on examination today.  Heart rate and blood pressure  are stable.  She remains on beta-blocker, ARB, spironolactone, low-dose torsemide, and empagliflozin.  We had previously tried transitioning her to [facility] however, this resulted in orthostasis and thus that is why she remains on ARB.  2.  Paroxysmal atrial fibrillation: Status post TEE and cardioversion in January 2023 with recurrent A-fib in March requiring a brief reload of amiodarone.  She is currently on amiodarone 200 mg daily as well as beta-blocker and Eliquis.  She is in sinus bradycardia today with stable first-degree AV block and mild QT prolongation.  We discussed following up labs today including TSH and LFTs however, patient has indicated that she is due for follow-up labs with her primary care next week and she will see if she can get those results faxed to [place].  3.  Nonobstructive CAD: Moderate, nonobstructive CAD on cardiac catheterization earlier this year including a 70% stenosis in the mid LAD.  She denies chest pain or dyspnea.  She remains on beta-blocker and statin therapy.  No aspirin in the setting of Eliquis.  4.  Hyperlipidemia: LDL of 60 in December 2022.  She is due for follow-up labs with primary care next week.  Continue statin therapy.  5.  Hypothyroidism: Patient notes that in the setting of amiodarone therapy, she has required a reduction in her levothyroxine dose.  This has been followed closely by her endocrinologist with plan for follow-up labs next week.  6.  Type 2 diabetes mellitus: Patient notes significant improvement in A1c to the upper 6% range, down from 10.  Followed closely by endocrinology.  7.  Disposition: Follow-up in clinic in 6 months or sooner if necessary.  Murray Hodgkins, NP 06/21/2022, 2:15 PM

## 2022-06-21 NOTE — Patient Instructions (Signed)
Medication Instructions:  - Your physician recommends that you continue on your current medications as directed. Please refer to the Current Medication list given to you today.  *If you need a refill on your cardiac medications before your next appointment, please call your pharmacy*   Lab Work: - none ordered  If you have labs (blood work) drawn today and your tests are completely normal, you will receive your results only by: [place] (if you have MyChart) OR A paper copy in the mail If you have any lab test that is abnormal or we need to change your treatment, we will call you to review the results.   Testing/Procedures: - none ordered   Follow-Up: At [facility], you and your health needs are our priority.  As part of our continuing mission to provide you with exceptional heart care, we have created designated Provider Care Teams.  These Care Teams include your primary Cardiologist (physician) and Advanced Practice Providers (APPs -  Physician Assistants and Nurse Practitioners) who all work together to provide you with the care you need, when you need it.  We recommend signing up for the patient portal called "MyChart".  Sign up information is provided on this After Visit Summary.  MyChart is used to connect with patients for Virtual Visits (Telemedicine).  Patients are able to view lab/test results, encounter notes, upcoming appointments, etc.  Non-urgent messages can be sent to your provider as well.   To learn more about what you can do with MyChart, go to [email or web address].    Your next appointment:   6 month(s)  The format for your next appointment:   In Person  Provider:   You may see Ida Rogue, MD or one of the following Advanced Practice Providers on your designated Care Team:   Murray Hodgkins, NP   Other Instructions N/a  Important Information About Sugar

## 2022-06-22 ENCOUNTER — Telehealth: Payer: Self-pay | Admitting: Cardiovascular Disease

## 2022-06-22 NOTE — Telephone Encounter (Signed)
Patient called to say that she can have her labs done at the other office, wanted to see if labs order can be put in for her. Please advise

## 2022-06-22 NOTE — Telephone Encounter (Signed)
We just need cmet, lipids, and TSH - indications PAF and hyperlipidemia.

## 2022-06-22 NOTE — Telephone Encounter (Signed)
Left voicemail message to call back for review of labs.

## 2022-06-29 NOTE — Telephone Encounter (Signed)
Left voicemail message to call back  

## 2022-07-24 ENCOUNTER — Telehealth: Payer: Self-pay

## 2022-07-24 DIAGNOSIS — Z794 Long term (current) use of insulin: Secondary | ICD-10-CM

## 2022-07-24 NOTE — Telephone Encounter (Signed)
Ok to place referral as requested. Use same diagnosis that was used for last referral. She should also establish with new PCP at our office.

## 2022-07-24 NOTE — Telephone Encounter (Unsigned)
Copied from [place] [id number]. Topic: Referral - Request for Referral >> Jul 24, 2022  2:43 PM Marcellus Scott wrote: Has patient seen PCP for this complaint? No. *If NO, is insurance requiring patient see PCP for this issue before PCP can refer them? Referral for which specialty: Endocrinologist Preferred provider/office: [facility] / [address] [place], [place] [address] Reason for referral: Pt is requesting a referral to a new endocrinologist, Rayetta Pigg, FNP, NP pt stated that Dr. Ky Barban had referred her to one, but she would like to change.Pt was advised that an appointment may possibly be needed however, pt declined to schedule.   Please advise.

## 2022-08-03 ENCOUNTER — Ambulatory Visit (INDEPENDENT_AMBULATORY_CARE_PROVIDER_SITE_OTHER): Payer: Medicare Other | Admitting: Podiatry

## 2022-08-03 ENCOUNTER — Encounter: Payer: Self-pay | Admitting: Podiatry

## 2022-08-03 VITALS — BP 156/68 | HR 58

## 2022-08-03 DIAGNOSIS — Z794 Long term (current) use of insulin: Secondary | ICD-10-CM

## 2022-08-03 DIAGNOSIS — B351 Tinea unguium: Secondary | ICD-10-CM | POA: Diagnosis not present

## 2022-08-03 DIAGNOSIS — M79675 Pain in left toe(s): Secondary | ICD-10-CM

## 2022-08-03 DIAGNOSIS — E1142 Type 2 diabetes mellitus with diabetic polyneuropathy: Secondary | ICD-10-CM

## 2022-08-03 DIAGNOSIS — M79674 Pain in right toe(s): Secondary | ICD-10-CM

## 2022-08-03 NOTE — Progress Notes (Signed)
This patient returns to my office for at risk foot care.  This patient requires this care by a professional since this patient will be at risk due to having diabetes and coagulation defect.  This patient is unable to cut nails herself since the patient cannot reach her nails.These nails are painful walking and wearing shoes.  This patient presents for at risk foot care today.  General Appearance  Alert, conversant and in no acute stress.  Vascular  Dorsalis pedis and posterior tibial  pulses are weakly  palpable  bilaterally.  Capillary return is within normal limits  bilaterally. Temperature is within normal limits  bilaterally.  Neurologic  Senn-Weinstein monofilament wire test diminished bilaterally. Muscle power within normal limits bilaterally.  Nails Thick disfigured discolored nails with subungual debris  from hallux to fifth toes bilaterally. No evidence of bacterial infection or drainage bilaterally.  Orthopedic  No limitations of motion  feet .  No crepitus or effusions noted.  No bony pathology or digital deformities noted.  Skin  normotropic skin with no porokeratosis noted bilaterally.  No signs of infections or ulcers noted.     Onychomycosis  Pain in right toes  Pain in left toes  Consent was obtained for treatment procedures.   Mechanical debridement of nails 1-5  bilaterally performed with a nail nipper.  Filed with dremel without incident.    Return office visit    4 months                 Told patient to return for periodic foot care and evaluation due to potential at risk complications.   Gardiner Barefoot DPM

## 2022-08-30 ENCOUNTER — Telehealth: Payer: Self-pay | Admitting: Family Medicine

## 2022-08-30 NOTE — Telephone Encounter (Signed)
Called patient to schedule Medicare Annual Wellness Visit (AWV). Left message for patient to call back and schedule Medicare Annual Wellness Visit (AWV).  Re-schedule awv on : 10/26/2022  Please schedule an appointment at any time with [facility].  If any questions, please contact me at [phone].  Thank you ,  [place] Direct Dial: [phone]

## 2022-09-07 ENCOUNTER — Encounter: Payer: Self-pay | Admitting: Nurse Practitioner

## 2022-09-07 ENCOUNTER — Ambulatory Visit (INDEPENDENT_AMBULATORY_CARE_PROVIDER_SITE_OTHER): Payer: Medicare Other | Admitting: Nurse Practitioner

## 2022-09-07 DIAGNOSIS — E119 Type 2 diabetes mellitus without complications: Secondary | ICD-10-CM

## 2022-09-07 MED ORDER — EMPAGLIFLOZIN 10 MG PO TABS: 10.0000 mg | ORAL_TABLET | Freq: Every day | ORAL | 3 refills | Status: DC

## 2022-09-07 MED ORDER — LANTUS SOLOSTAR 100 UNIT/ML ~~LOC~~ SOPN: 40.0000 [IU] | PEN_INJECTOR | Freq: Every day | SUBCUTANEOUS | 3 refills | Status: DC

## 2022-09-07 NOTE — Patient Instructions (Signed)

## 2022-09-07 NOTE — Progress Notes (Signed)
Endocrinology Consult Note       09/07/2022, 11:53 AM   Subjective:    Patient ID: Michele [name], female    DOB: 07-08-1956.  Michele [name] is being seen in consultation for management of currently uncontrolled symptomatic diabetes requested by  Eulas Post, MD.   Past Medical History:  Diagnosis Date   Abscess of breast, right 08/19/2012   Breast cancer ([place]) 07/13/2010   1.5 cm,intermediate grade DCIS, nuclear grade 2, ER 90%, PR 90% treated with wide excision, reexcision to negative margins and MammoSite partial breast radiation.   Coronary artery disease    a. 2009 Cath: nonobs LAD dzs; b. 07/2021 Cath: LM nl, LAD 27m D1 mild dzs, D2 40, LCX nl, RCA 40p. PCWP 24. PA 37/26(30).   DCIS (ductal carcinoma in situ) of breast, right 2012   Diabetes mellitus ([place]    Type II   GERD (gastroesophageal reflux disease)    HFrEF (heart failure with reduced ejection fraction) ([place]    a. 2009 Echo: LV dysfxn; b. 2013 Echo: EF 50-55%; c. 07/2021 Echo: EF<20%, glob HK - in/inflat moves best. Nl PASP. Sev dil LA. Mildly dil RA. Mild-mod MR; d. 01/2022 Echo: EF 50-55%, no rwma, mod LVH, GrI DD, nl RV fxn.   Hyperlipidemia    Hypertension    Ischemic cardiomyopathy    a. 2009 Echo: LV dysfxn; b. 2013 Echo: EF 50-55%; c. 07/2021 Echo: EF<20% d. 01/2022 Echo: EF 50-55%, no rwma, mod LVH, GrI DD, nl RV fxn.   Obesity, unspecified    Persistent atrial fibrillation ([place]    a. 07/2021 s/p TEE and DCCV (150J)-->amio/eliquis (CHA2DS2VASc 6).   Personal history of radiation therapy    Sleep apnea    Thyroid disease    hypothyroidism   Umbilical hernia without mention of obstruction or gangrene     Past Surgical History:  Procedure Laterality Date   BREAST BIOPSY Right 2007   right bx neg   BREAST BIOPSY Right 08/10/2016   fat necrosis/ done in Dr. BDwyane Luooffice   BREAST EXCISIONAL BIOPSY Right 2011   DCIS mammosite  lumpectomy   BREAST LUMPECTOMY Right Jan 2012   Wide excision   BREAST MASS EXCISION Right 2011   December   CARDIAC CATHETERIZATION  01/08/2008   CHOLECYSTECTOMY  2013   [facility]  HERNIA REPAIR  2013   epigastric   INCISION AND DRAINAGE BREAST ABSCESS Right 08/19/2012   RIGHT/LEFT HEART CATH AND CORONARY ANGIOGRAPHY N/A 07/15/2021   Procedure: RIGHT/LEFT HEART CATH AND CORONARY ANGIOGRAPHY;  Surgeon: AWellington Hampshire MD;  [facility];  Service: Cardiovascular;  Laterality: N/A;   TEE WITHOUT CARDIOVERSION N/A 07/19/2021   Procedure: TRANSESOPHAGEAL ECHOCARDIOGRAM (TEE);  Surgeon: GMinna Merritts MD;  Location: [facility];  Service: Cardiovascular;  Laterality: N/A;   TONSILLECTOMY AND ADENOIDECTOMY     age [age]yrs    Social History   Socioeconomic History   Marital status: Widowed    Spouse name: [name]   Number of children: 0   Years of education: Not on file   Highest education level: Not on file  Occupational History  Occupation: retired    Fish farm manager: [employer]  Tobacco Use   Smoking status: Never   Smokeless tobacco: Never  Vaping Use   Vaping Use: Never used  Substance and Sexual Activity   Alcohol use: No   Drug use: No   Sexual activity: Not on file  Other Topics Concern   Not on file  Social History Narrative   Never smoked; no alcohol; lives in [place]; self;    husband passed in 2020.    Retired from [employer].    Good relationship with in-laws - they help when she needs it   Social Determinants of Health   Financial Resource Strain: Low Risk  (10/24/2021)   Overall Financial Resource Strain (CARDIA)    Difficulty of Paying Living Expenses: Not hard at all  Food Insecurity: No Food Insecurity (10/24/2021)   Hunger Vital Sign    Worried About Running Out of Food in the Last Year: Never true    [place] in the Last Year: Never true  Transportation Needs: No Transportation Needs (10/24/2021)   PRAPARE -  Hydrologist (Medical): No    Lack of Transportation (Non-Medical): No  Recent Concern: Transportation Needs - Unmet Transportation Needs (08/05/2021)   PRAPARE - Transportation    Lack of Transportation (Medical): Yes    Lack of Transportation (Non-Medical): Yes  Physical Activity: Insufficiently Active (10/24/2021)   Exercise Vital Sign    Days of Exercise per Week: 5 days    Minutes of Exercise per Session: 20 min  Stress: No Stress Concern Present (10/24/2021)   [place]    Feeling of Stress : Not at all  Social Connections: Socially Isolated (10/24/2021)   Social Connection and Isolation Panel [NHANES]    Frequency of Communication with Friends and Family: More than three times a week    Frequency of Social Gatherings with Friends and Family: More than three times a week    Attends Religious Services: Never    Marine scientist or Organizations: No    Attends Archivist Meetings: Never    Marital Status: Widowed    Family History  Problem Relation Age of Onset   Heart failure Mother    Diabetes Mother    Congestive Heart Failure Mother    CVA Mother    Heart attack Father    Arthritis Father    Diabetes Sister    Hypertension Sister    Lung cancer Sister    Hypertension Brother    Hypertension Brother    Hemochromatosis Brother    Stroke Maternal Grandmother    Heart attack Maternal Grandfather    Heart attack Paternal Grandmother    Breast cancer Neg Hx     Outpatient Encounter Medications as of 09/07/2022  Medication Sig   amiodarone (PACERONE) 200 MG tablet Take 1 tablet (200 mg total) by mouth daily.   Blood Glucose Monitoring Suppl (ONE TOUCH ULTRA SYSTEM KIT) w/Device KIT Check sugar once daily. DX E11.9   digoxin (LANOXIN) 0.125 MG tablet Take 0.0625 mg by mouth daily.   ELIQUIS 5 MG TABS tablet TAKE 1 TABLET BY MOUTH TWICE A DAY   fluticasone  (FLONASE) 50 MCG/ACT nasal spray Place 2 sprays into the nose daily.   glucose blood (ONE TOUCH ULTRA TEST) test strip Use as instructed   Insulin Pen Needle 32G X 4 MM MISC Inject twice daily, SQ. DX E11.9   Lancets (  ONETOUCH ULTRASOFT) lancets Check sugar once daily DX E11.9   levothyroxine (SYNTHROID) 150 MCG tablet Take 150 mcg by mouth every morning.   losartan (COZAAR) 25 MG tablet Take 0.5 tablets (12.5 mg total) by mouth daily.   metoprolol succinate (TOPROL-XL) 25 MG 24 hr tablet Take 1 tablet (25 mg total) by mouth daily.   rosuvastatin (CRESTOR) 10 MG tablet Take 1 tablet (10 mg total) by mouth daily.   spironolactone (ALDACTONE) 25 MG tablet Take 0.5 tablets (12.5 mg total) by mouth daily.   torsemide (DEMADEX) 20 MG tablet Take 1 tablet (20 mg total) by mouth daily.   [DISCONTINUED] empagliflozin (JARDIANCE) 10 MG TABS tablet Take 10 mg by mouth daily.   [DISCONTINUED] insulin glargine (LANTUS SOLOSTAR) 100 UNIT/ML Solostar Pen Inject 15 Units into the skin 2 (two) times daily. (Patient taking differently: Inject 50 Units into the skin daily.)   empagliflozin (JARDIANCE) 10 MG TABS tablet Take 1 tablet (10 mg total) by mouth daily.   insulin glargine (LANTUS SOLOSTAR) 100 UNIT/ML Solostar Pen Inject 40 Units into the skin at bedtime.   NON FORMULARY CPAP AT BEDTIME (Patient not taking: Reported on 09/07/2022)   [DISCONTINUED] insulin glargine (LANTUS) 100 UNIT/ML injection Inject into the skin daily. (Patient not taking: Reported on 09/07/2022)   No facility-administered encounter medications on file as of 09/07/2022.    ALLERGIES: Allergies  Allergen Reactions   Entresto [Sacubitril-Valsartan] Other (See Comments)    hypotension    VACCINATION STATUS: Immunization History  Administered Date(s) Administered   PNEUMOCOCCAL CONJUGATE-20 06/22/2021   Pneumococcal Polysaccharide-23 05/13/2009   Tdap 05/13/2009    Diabetes She presents for her initial diabetic visit. She has  type 2 diabetes mellitus. Onset time: Diagnosed at approx age of 71. Hypoglycemia symptoms include nervousness/anxiousness, sweats and tremors. Associated symptoms include foot paresthesias. There are no hypoglycemic complications. Symptoms are stable. Diabetic complications include heart disease (CHF) and nephropathy. Risk factors for coronary artery disease include diabetes mellitus, dyslipidemia, family history, hypertension, sedentary lifestyle and post-menopausal. Current diabetic treatment includes insulin injections and oral agent (monotherapy). She is compliant with treatment most of the time. Her weight is stable. She is following a generally healthy diet. When asked about meal planning, she reported none. She has had a previous visit with a dietitian. She participates in exercise intermittently. Her home blood glucose trend is decreasing steadily. Her overall blood glucose range is 140-180 mg/dl. (She presents today, accompanied by her SIL, with her CGM showing at goal glycemic profile overall.  Her most recent A1c on 1/13 was 7.4%.  Analysis of her CGM shows TIR 71%, TAR 29%, TBR 0% with a GMI of 7.2%.  She drinks mostly water, unsweet tea (with Splenda), and an occasional zero sugar Sprite.  She tends to sleep late, therefore combines breakfast and lunch together, then snacks, then eats supper.  She will be starting exercise program soon with seated elliptical (has bilateral knee pain).  She is UTD on eye exam, sees podiatry routinely.) An ACE inhibitor/angiotensin II receptor blocker is being taken. She sees a podiatrist.Eye exam is current.     Review of systems  Constitutional: + Minimally fluctuating body weight, current Body mass index is 30.84 kg/m., no fatigue, no subjective hyperthermia, no subjective hypothermia Eyes: no blurry vision, no xerophthalmia ENT: no sore throat, no nodules palpated in throat, no dysphagia/odynophagia, no hoarseness Cardiovascular: no chest pain, no shortness  of breath, no palpitations, no leg swelling Respiratory: no cough, no shortness of breath Gastrointestinal: no  nausea/vomiting/diarrhea Musculoskeletal: no muscle/joint aches Skin: no rashes, no hyperemia Neurological: no tremors, no numbness, no tingling, no dizziness Psychiatric: no depression, no anxiety  Objective:     BP 139/85 (BP Location: Left Arm, Patient Position: Sitting, Cuff Size: Large)   Pulse 60   Ht '5\' 2"'$  (1.575 m)   Wt 168 lb 9.6 oz (76.5 kg)   BMI 30.84 kg/m   Wt Readings from Last 3 Encounters:  09/07/22 168 lb 9.6 oz (76.5 kg)  06/21/22 168 lb 3.2 oz (76.3 kg)  03/20/22 169 lb (76.7 kg)     BP Readings from Last 3 Encounters:  09/07/22 139/85  08/03/22 (!) 156/68  06/21/22 120/60     Physical Exam- Limited  Constitutional:  Body mass index is 30.84 kg/m. , not in acute distress, normal state of mind Eyes:  EOMI, no exophthalmos Neck: Supple Cardiovascular: RRR, no murmurs, rubs, or gallops, no edema Respiratory: Adequate breathing efforts, no crackles, rales, rhonchi, or wheezing Musculoskeletal: no gross deformities, strength intact in all four extremities, no gross restriction of joint movements Skin:  no rashes, no hyperemia Neurological: no tremor with outstretched hands    CMP ( most recent) CMP     Component Value Date/Time   NA 138 01/17/2022 1427   NA 137 08/10/2021 1132   K 4.1 01/17/2022 1427   CL 103 01/17/2022 1427   CO2 23 01/17/2022 1427   GLUCOSE 261 (H) 01/17/2022 1427   BUN 43 (H) 01/17/2022 1427   BUN 36 (H) 08/10/2021 1132   CREATININE 1.43 (H) 01/17/2022 1427   CALCIUM 9.5 01/17/2022 1427   PROT 6.4 (L) 07/19/2021 0513   PROT 7.5 06/22/2021 1501   ALBUMIN 3.3 (L) 07/19/2021 0513   ALBUMIN 4.6 06/22/2021 1501   AST 84 (H) 07/19/2021 0513   ALT 119 (H) 07/19/2021 0513   ALKPHOS 131 (H) 07/19/2021 0513   BILITOT 0.9 07/19/2021 0513   BILITOT 0.8 06/22/2021 1501   GFRNONAA 40 (L) 01/17/2022 1427   GFRAA >60  12/16/2019 1214     Diabetic Labs (most recent): Lab Results  Component Value Date   HGBA1C 10.0 (A) 06/22/2021   HGBA1C 9.8 (H) 11/19/2019   HGBA1C 9.9 (A) 07/30/2018     Lipid Panel ( most recent) Lipid Panel     Component Value Date/Time   CHOL 113 06/22/2021 1501   TRIG 110 06/22/2021 1501   HDL 33 (L) 06/22/2021 1501   CHOLHDL 3.4 06/22/2021 1501   LDLCALC 60 06/22/2021 1501   LABVLDL 20 06/22/2021 1501      Lab Results  Component Value Date   TSH 8.127 (H) 07/13/2021   TSH 7.020 (H) 06/22/2021   TSH 0.841 11/19/2019   TSH 14.720 (H) 03/18/2018   TSH 2.610 10/18/2016   TSH 0.978 10/11/2015   TSH 40.220 (H) 06/02/2015   TSH 3.400 01/15/2015   TSH 35.980 (H) 01/01/2015   TSH 2.36 12/31/2013   FREET4 0.65 07/13/2021   FREET4 1.50 01/15/2015           Assessment & Plan:   1) 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  Her most recent A1c on 1/13 was 7.4%.  Analysis of her CGM shows TIR 71%, TAR 29%, TBR 0% with a GMI of 7.2%.  She drinks mostly water, unsweet tea (with Splenda), and an occasional zero sugar Sprite.  She  tends to sleep late, therefore combines breakfast and lunch together, then snacks, then eats supper.  She will be starting exercise program soon with seated elliptical (has bilateral knee pain).  She is UTD on eye exam, sees podiatry routinely.  - Michele [name] has currently uncontrolled symptomatic type 2 DM since 67 years of age, with most recent A1c of 7.4 %.   -Recent labs reviewed.  - I had a long discussion with her about the progressive nature of diabetes and the pathology behind its complications. -her diabetes is complicated by CKD stage 3b, CHF, neuropathy and she remains at a high risk for more acute and chronic complications which include CAD, CVA, CKD, retinopathy, and neuropathy. These are all  discussed in detail with her.  The following Lifestyle Medicine recommendations according to [facility]) were discussed and offered to patient and she agrees to start the journey:  A. Whole Foods, Plant-based plate comprising of fruits and vegetables, plant-based proteins, whole-grain carbohydrates was discussed in detail with the patient.   A list for source of those nutrients were also provided to the patient.  Patient will use only water or unsweetened tea for hydration. B.  The need to stay away from risky substances including alcohol, smoking; obtaining 7 to 9 hours of restorative sleep, at least 150 minutes of moderate intensity exercise weekly, the importance of healthy social connections,  and stress reduction techniques were discussed. C.  A full color page of  Calorie density of various food groups per pound showing examples of each food groups was provided to the patient.  - I have counseled her on diet and weight management by adopting a carbohydrate restricted/protein rich diet. Patient is encouraged to switch to unprocessed or minimally processed complex starch and increased protein intake (animal or plant source), fruits, and vegetables. -  she is advised to stick to a routine mealtimes to eat 3 meals a day and avoid unnecessary snacks (to snack only to correct hypoglycemia).   - she acknowledges that there is a room for improvement in her food and drink choices. - Suggestion is made for her to avoid simple carbohydrates from her diet including Cakes, Sweet Desserts, Ice Cream, Soda (diet and regular), Sweet Tea, Candies, Chips, Cookies, Store Bought Juices, Alcohol in Excess of 1-2 drinks a day, Artificial Sweeteners, Coffee Creamer, and "Sugar-free" Products. This will help patient to have more stable blood glucose profile and potentially avoid unintended weight gain.  - I have approached her with the following individualized plan to manage her diabetes  and patient agrees:   -She is advised to lower her Lantus to 40 units SQ nightly.  -she is encouraged to continue monitoring glucose 4 times daily (using her CGM), before meals and before bed, and to call the clinic if she has readings less than 70 or above 300 for 3 tests in a row.  - she is warned not to take insulin without proper monitoring per orders. - Adjustment parameters are given to her for hypo and hyperglycemia in writing.  - she is advised to continue Jardiance 10, therapeutically suitable for patient given her history of CHF and CKD.  - Specific targets for  A1c; LDL, HDL, and Triglycerides were discussed with the patient.  2) Blood Pressure /Hypertension:  her blood pressure is controlled to target.   she is advised to continue her current medications including Losartan 12.5 mg p.o. daily with breakfast, Metoprolol 25 mg po daily, Demadex 20 mg po daily  and Spironolactone 12.5 mg po daily.  3) Lipids/Hyperlipidemia:    Review of her recent lipid panel from 11/15/21 showed controlled LDL at 74 and elevated triglycerides of 229 .  she is advised to continue Crestor 10 mg daily at bedtime.  Side effects and precautions discussed with her.  4)  Weight/Diet:  her Body mass index is 30.84 kg/m.  -  clearly complicating her diabetes care.   she is a candidate for weight loss. I discussed with her the fact that loss of 5 - 10% of her  current body weight will have the most impact on her diabetes management.  Exercise, and detailed carbohydrates information provided  -  detailed on discharge instructions.  5) Hypothyroidism The details of her diagnosis are not available to review.  She is currently on Levothyroxine 150 mcg po daily before breakfast.  She says this was dropped recently due to interference with Amiodarone causing her thyroid to increase activity.  Will recheck on subsequent visits.  6) Chronic Care/Health Maintenance: -she is on ACEI/ARB and Statin medications and is  encouraged to initiate and continue to follow up with Ophthalmology, Dentist, Podiatrist at least yearly or according to recommendations, and advised to stay away from smoking. I have recommended yearly flu vaccine and pneumonia vaccine at least every 5 years; moderate intensity exercise for up to 150 minutes weekly; and sleep for at least 7 hours a day.  - she is advised to maintain close follow up with Eulas Post, MD for primary care needs, as well as her other providers for optimal and coordinated care.   - Time spent in this patient care: 60 min, of which > 50% was spent in counseling her about her diabetes and the rest reviewing her blood glucose logs, discussing her hypoglycemia and hyperglycemia episodes, reviewing her current and previous labs/studies (including abstraction from other facilities) and medications doses and developing a long term treatment plan based on the latest standards of care/guidelines; and documenting her care.    Please refer to Patient Instructions for Blood Glucose Monitoring and Insulin/Medications Dosing Guide" in media tab for additional information. Please also refer to "Patient Self Inventory" in the Media tab for reviewed elements of pertinent patient history.  Clovis Cao participated in the discussions, expressed understanding, and voiced agreement with the above plans.  All questions were answered to her satisfaction. she is encouraged to contact clinic should she have any questions or concerns prior to her return visit.     Follow up plan: - Return in about 3 months (around 12/06/2022) for Diabetes F/U with A1c in office, No previsit labs, Bring meter and logs.    Rayetta Pigg, [facility] [address] [place], [place] [address] Phone: [phone] Fax: [phone]  09/07/2022, 11:53 AM

## 2022-09-08 ENCOUNTER — Other Ambulatory Visit: Payer: Self-pay | Admitting: Cardiovascular Disease

## 2022-09-08 DIAGNOSIS — I4891 Unspecified atrial fibrillation: Secondary | ICD-10-CM

## 2022-09-08 NOTE — Telephone Encounter (Signed)
Please advise if OK to refill. Thank you!

## 2022-09-30 ENCOUNTER — Other Ambulatory Visit: Payer: Self-pay | Admitting: Nurse Practitioner

## 2022-10-02 ENCOUNTER — Other Ambulatory Visit: Payer: Self-pay | Admitting: Cardiovascular Disease

## 2022-10-02 NOTE — Telephone Encounter (Signed)
Refill request

## 2022-10-02 NOTE — Telephone Encounter (Signed)
Prescription refill request for Eliquis received. Indication: AF Last office visit: 06/21/22  Angelica Ran NP Scr: 1.74 on 07/21/22 Age: 67 Weight: 76.3kg  Based on above findings Eliquis 5mg  twice daily is the appropriate dose.  Refill approved.

## 2022-11-05 ENCOUNTER — Other Ambulatory Visit: Payer: Self-pay | Admitting: Cardiovascular Disease

## 2022-11-09 ENCOUNTER — Other Ambulatory Visit: Payer: Self-pay | Admitting: Cardiovascular Disease

## 2022-11-09 DIAGNOSIS — I4891 Unspecified atrial fibrillation: Secondary | ICD-10-CM

## 2022-11-30 ENCOUNTER — Ambulatory Visit (INDEPENDENT_AMBULATORY_CARE_PROVIDER_SITE_OTHER): Payer: Medicare Other | Admitting: Podiatry

## 2022-11-30 DIAGNOSIS — E1142 Type 2 diabetes mellitus with diabetic polyneuropathy: Secondary | ICD-10-CM | POA: Diagnosis not present

## 2022-11-30 DIAGNOSIS — M79675 Pain in left toe(s): Secondary | ICD-10-CM

## 2022-11-30 DIAGNOSIS — B351 Tinea unguium: Secondary | ICD-10-CM | POA: Diagnosis not present

## 2022-11-30 DIAGNOSIS — M79674 Pain in right toe(s): Secondary | ICD-10-CM

## 2022-11-30 DIAGNOSIS — Z794 Long term (current) use of insulin: Secondary | ICD-10-CM

## 2022-12-04 ENCOUNTER — Encounter: Payer: Self-pay | Admitting: Podiatry

## 2022-12-04 NOTE — Progress Notes (Signed)
  Subjective:  Patient ID: Michele [name], female    DOB: [birth year],  MRN: [id number]  Michele [name] presents to clinic today for:  Chief Complaint  Patient presents with   Nail Problem    DFC,Referring Provider Bosie Clos, MD,LOV:09/23,B/S:145 today, A1C:7.4       PCP is Bosie Clos, MD.  Allergies  Allergen Reactions   Sherryll Burger [Sacubitril-Valsartan] Other (See Comments)    hypotension    Review of Systems: Negative except as noted in the HPI.  Objective: No changes noted in today's physical examination. There were no vitals filed for this visit.  Michele [name] is a pleasant 67 y.o. female in NAD. AAO x 3.  Vascular Examination: Capillary refill time <3 seconds b/l LE. Faintly palpable pedal pulses b/l LE. Digital hair sparse b/l. No pedal edema b/l. Skin temperature gradient WNL b/l. No varicosities b/l. Marland Kitchen  Dermatological Examination: Pedal skin with normal turgor, texture and tone b/l. No open wounds. No interdigital macerations b/l. Toenails 1-5 b/l thickened, discolored, dystrophic with subungual debris. There is pain on palpation to dorsal aspect of nailplates. No hyperkeratotic nor porokeratotic lesions present on today's visit.Marland Kitchen  Neurological Examination: Protective sensation diminished with 10g monofilament b/l.  Musculoskeletal Examination: Muscle strength 5/5 to all LE muscle groups b/l. No pain, crepitus or joint limitation noted with ROM bilateral LE. No gross bony deformities bilaterally.  Assessment/Plan: 1. Pain due to onychomycosis of toenails of both feet   2. Type 2 diabetes mellitus with diabetic polyneuropathy, with long-term current use of insulin (HCC)   -Patient was evaluated and treated. All patient's and/or POA's questions/concerns answered on today's visit. -Continue foot and shoe inspections daily. Monitor blood glucose per PCP/Endocrinologist's recommendations. -Patient to continue soft, supportive shoe gear daily. -Toenails  1-5 b/l were debrided in length and girth with sterile nail nippers and dremel without iatrogenic bleeding.  -Patient/POA to call should there be question/concern in the interim.   Return in about 4 months (around 04/02/2023).  Freddie Breech, DPM

## 2022-12-06 ENCOUNTER — Other Ambulatory Visit: Payer: Self-pay | Admitting: General Surgery

## 2022-12-06 DIAGNOSIS — Z1231 Encounter for screening mammogram for malignant neoplasm of breast: Secondary | ICD-10-CM

## 2022-12-08 ENCOUNTER — Other Ambulatory Visit: Payer: Self-pay | Admitting: Cardiovascular Disease

## 2022-12-12 ENCOUNTER — Ambulatory Visit: Payer: Medicare Other | Admitting: Nurse Practitioner

## 2022-12-19 ENCOUNTER — Encounter: Payer: Self-pay | Admitting: Nurse Practitioner

## 2022-12-19 ENCOUNTER — Other Ambulatory Visit: Payer: Self-pay | Admitting: Nurse Practitioner

## 2022-12-19 ENCOUNTER — Ambulatory Visit (INDEPENDENT_AMBULATORY_CARE_PROVIDER_SITE_OTHER): Payer: Medicare Other | Admitting: Nurse Practitioner

## 2022-12-19 VITALS — BP 128/74 | HR 64 | Ht 62.0 in | Wt 168.8 lb

## 2022-12-19 DIAGNOSIS — E119 Type 2 diabetes mellitus without complications: Secondary | ICD-10-CM

## 2022-12-19 DIAGNOSIS — Z794 Long term (current) use of insulin: Secondary | ICD-10-CM

## 2022-12-19 DIAGNOSIS — Z7984 Long term (current) use of oral hypoglycemic drugs: Secondary | ICD-10-CM

## 2022-12-19 LAB — POCT GLYCOSYLATED HEMOGLOBIN (HGB A1C): Hemoglobin A1C: 7.1 % — AB (ref 4.0–5.6)

## 2022-12-19 MED ORDER — GLIPIZIDE ER 5 MG PO TB24: 5.0000 mg | ORAL_TABLET | Freq: Every day | ORAL | 1 refills | Status: DC

## 2022-12-19 MED ORDER — LANTUS SOLOSTAR 100 UNIT/ML ~~LOC~~ SOPN: 50.0000 [IU] | PEN_INJECTOR | Freq: Every day | SUBCUTANEOUS | 3 refills | Status: DC

## 2022-12-19 MED ORDER — GLUCOSE BLOOD VI STRP: ORAL_STRIP | 3 refills | Status: DC

## 2022-12-19 NOTE — Progress Notes (Signed)
Endocrinology Follow Up Note       12/19/2022, 11:51 AM   Subjective:    Patient ID: Michele [name], female    DOB: 22-Sep-1955.  Michele [name] is being seen in follow up after being seen in consultation for management of currently uncontrolled symptomatic diabetes requested by  Bosie Clos, MD.   Past Medical History:  Diagnosis Date   Abscess of breast, right 08/19/2012   Breast cancer (HCC) 07/13/2010   1.5 cm,intermediate grade DCIS, nuclear grade 2, ER 90%, PR 90% treated with wide excision, reexcision to negative margins and MammoSite partial breast radiation.   Coronary artery disease    a. 2009 Cath: nonobs LAD dzs; b. 07/2021 Cath: LM nl, LAD 78m, D1 mild dzs, D2 40, LCX nl, RCA 40p. PCWP 24. PA 37/26(30).   DCIS (ductal carcinoma in situ) of breast, right 2012   Diabetes mellitus (HCC)    Type II   GERD (gastroesophageal reflux disease)    HFrEF (heart failure with reduced ejection fraction) (HCC)    a. 2009 Echo: LV dysfxn; b. 2013 Echo: EF 50-55%; c. 07/2021 Echo: EF<20%, glob HK - in/inflat moves best. Nl PASP. Sev dil LA. Mildly dil RA. Mild-mod MR; d. 01/2022 Echo: EF 50-55%, no rwma, mod LVH, GrI DD, nl RV fxn.   Hyperlipidemia    Hypertension    Ischemic cardiomyopathy    a. 2009 Echo: LV dysfxn; b. 2013 Echo: EF 50-55%; c. 07/2021 Echo: EF<20% d. 01/2022 Echo: EF 50-55%, no rwma, mod LVH, GrI DD, nl RV fxn.   Obesity, unspecified    Persistent atrial fibrillation (HCC)    a. 07/2021 s/p TEE and DCCV (150J)-->amio/eliquis (CHA2DS2VASc 6).   Personal history of radiation therapy    Sleep apnea    Thyroid disease    hypothyroidism   Umbilical hernia without mention of obstruction or gangrene     Past Surgical History:  Procedure Laterality Date   BREAST BIOPSY Right 2007   right bx neg   BREAST BIOPSY Right 08/10/2016   fat necrosis/ done in Dr. Rutherford Nail office   BREAST EXCISIONAL BIOPSY  Right 2011   DCIS mammosite lumpectomy   BREAST LUMPECTOMY Right Jan 2012   Wide excision   BREAST MASS EXCISION Right 2011   December   CARDIAC CATHETERIZATION  01/08/2008   CHOLECYSTECTOMY  2013   [facility]   HERNIA REPAIR  2013   epigastric   INCISION AND DRAINAGE BREAST ABSCESS Right 08/19/2012   RIGHT/LEFT HEART CATH AND CORONARY ANGIOGRAPHY N/A 07/15/2021   Procedure: RIGHT/LEFT HEART CATH AND CORONARY ANGIOGRAPHY;  Surgeon: Iran Ouch, MD;  Location: [facility];  Service: Cardiovascular;  Laterality: N/A;   TEE WITHOUT CARDIOVERSION N/A 07/19/2021   Procedure: TRANSESOPHAGEAL ECHOCARDIOGRAM (TEE);  Surgeon: Antonieta Iba, MD;  Location: [facility];  Service: Cardiovascular;  Laterality: N/A;   TONSILLECTOMY AND ADENOIDECTOMY     age 67 yrs    Social History   Socioeconomic History   Marital status: Widowed    Spouse name: [name]   Number of children: 0   Years of education: Not on file   Highest education level:  Not on file  Occupational History   Occupation: retired    Associate Professor: [employer]  Tobacco Use   Smoking status: Never   Smokeless tobacco: Never  Vaping Use   Vaping Use: Never used  Substance and Sexual Activity   Alcohol use: No   Drug use: No   Sexual activity: Not on file  Other Topics Concern   Not on file  Social History Narrative   Never smoked; no alcohol; lives in [place]; self;    husband passed in 2020.    Retired from [employer].    Good relationship with in-laws - they help when she needs it   Social Determinants of Health   Financial Resource Strain: Low Risk  (10/24/2021)   Overall Financial Resource Strain (CARDIA)    Difficulty of Paying Living Expenses: Not hard at all  Food Insecurity: No Food Insecurity (10/24/2021)   Hunger Vital Sign    Worried About Running Out of Food in the Last Year: Never true    Ran Out of Food in the Last Year: Never true  Transportation Needs: No Transportation Needs  (10/24/2021)   PRAPARE - Administrator, Civil Service (Medical): No    Lack of Transportation (Non-Medical): No  Recent Concern: Transportation Needs - Unmet Transportation Needs (08/05/2021)   PRAPARE - Transportation    Lack of Transportation (Medical): Yes    Lack of Transportation (Non-Medical): Yes  Physical Activity: Insufficiently Active (10/24/2021)   Exercise Vital Sign    Days of Exercise per Week: 5 days    Minutes of Exercise per Session: 20 min  Stress: No Stress Concern Present (10/24/2021)   [employer] of Occupational Health - Occupational Stress Questionnaire    Feeling of Stress : Not at all  Social Connections: Socially Isolated (10/24/2021)   Social Connection and Isolation Panel [NHANES]    Frequency of Communication with Friends and Family: More than three times a week    Frequency of Social Gatherings with Friends and Family: More than three times a week    Attends Religious Services: Never    Database administrator or Organizations: No    Attends Banker Meetings: Never    Marital Status: Widowed    Family History  Problem Relation Age of Onset   Heart failure Mother    Diabetes Mother    Congestive Heart Failure Mother    CVA Mother    Heart attack Father    Arthritis Father    Diabetes Sister    Hypertension Sister    Lung cancer Sister    Hypertension Brother    Hypertension Brother    Hemochromatosis Brother    Stroke Maternal Grandmother    Heart attack Maternal Grandfather    Heart attack Paternal Grandmother    Breast cancer Neg Hx     Outpatient Encounter Medications as of 12/19/2022  Medication Sig   amiodarone (PACERONE) 200 MG tablet TAKE 1 TABLET BY MOUTH EVERY DAY   Blood Glucose Monitoring Suppl (ONE TOUCH ULTRA SYSTEM KIT) w/Device KIT Check sugar once daily. DX E11.9   digoxin (LANOXIN) 0.125 MG tablet Take 0.0625 mg by mouth daily.   ELIQUIS 5 MG TABS tablet TAKE 1 TABLET BY MOUTH TWICE A DAY    fluticasone (FLONASE) 50 MCG/ACT nasal spray Place 2 sprays into the nose daily.   glipiZIDE (GLUCOTROL XL) 5 MG 24 hr tablet Take 1 tablet (5 mg total) by mouth daily with breakfast.   Insulin Pen Needle  32G X 4 MM MISC Inject twice daily, SQ. DX E11.9   Lancets (ONETOUCH ULTRASOFT) lancets Check sugar once daily DX E11.9   levothyroxine (SYNTHROID) 150 MCG tablet Take 150 mcg by mouth every morning.   losartan (COZAAR) 25 MG tablet Take 0.5 tablets (12.5 mg total) by mouth daily.   metoprolol succinate (TOPROL-XL) 25 MG 24 hr tablet TAKE 1 TABLET (25 MG TOTAL) BY MOUTH DAILY.   NON FORMULARY CPAP AT BEDTIME   rosuvastatin (CRESTOR) 10 MG tablet Take 1 tablet (10 mg total) by mouth daily.   spironolactone (ALDACTONE) 25 MG tablet TAKE 1/2 TABLET BY MOUTH EVERY DAY   torsemide (DEMADEX) 20 MG tablet Take 1 tablet (20 mg total) by mouth daily.   [DISCONTINUED] empagliflozin (JARDIANCE) 10 MG TABS tablet Take 1 tablet (10 mg total) by mouth daily.   [DISCONTINUED] glucose blood (ONE TOUCH ULTRA TEST) test strip Use as instructed   [DISCONTINUED] insulin glargine (LANTUS SOLOSTAR) 100 UNIT/ML Solostar Pen Inject 40 Units into the skin at bedtime.   glucose blood (ONE TOUCH ULTRA TEST) test strip Use as instructed   insulin glargine (LANTUS SOLOSTAR) 100 UNIT/ML Solostar Pen Inject 50 Units into the skin at bedtime.   No facility-administered encounter medications on file as of 12/19/2022.    ALLERGIES: Allergies  Allergen Reactions   Entresto [Sacubitril-Valsartan] Other (See Comments)    hypotension    VACCINATION STATUS: Immunization History  Administered Date(s) Administered   PNEUMOCOCCAL CONJUGATE-20 06/22/2021   Pneumococcal Polysaccharide-23 05/13/2009   Tdap 05/13/2009    Diabetes She presents for her follow-up diabetic visit. She has type 2 diabetes mellitus. Onset time: Diagnosed at approx age of 51. Her disease course has been improving. There are no hypoglycemic associated  symptoms. Associated symptoms include foot paresthesias. There are no hypoglycemic complications. Symptoms are stable. Diabetic complications include heart disease (CHF) and nephropathy. Risk factors for coronary artery disease include diabetes mellitus, dyslipidemia, family history, hypertension, sedentary lifestyle and post-menopausal. Current diabetic treatment includes insulin injections and oral agent (monotherapy). She is compliant with treatment most of the time. Her weight is fluctuating minimally. She is following a generally healthy diet. When asked about meal planning, she reported none. She has had a previous visit with a dietitian. She participates in exercise intermittently. Her home blood glucose trend is decreasing steadily. Her breakfast blood glucose range is generally 140-180 mg/dl. Her overall blood glucose range is 180-200 mg/dl. (She presents today, with her CGM and meter showing improving glycemic profile.  Her POCT A1c today is 7.1%, improving from last visit of 7.4%.  Analysis of her meter shows 7-day average of 164, 14-day average of 167, 30-day average of 174.  She denies any hypoglycemia.  She is interested in coming off the Jardiance due to yeast in abdominal folds.) An ACE inhibitor/angiotensin II receptor blocker is being taken. She sees a podiatrist.Eye exam is current.    Review of systems  Constitutional: + Minimally fluctuating body weight,  current Body mass index is 30.87 kg/m. , no fatigue, no subjective hyperthermia, no subjective hypothermia Eyes: no blurry vision, no xerophthalmia ENT: no sore throat, no nodules palpated in throat, no dysphagia/odynophagia, no hoarseness Cardiovascular: no chest pain, no shortness of breath, no palpitations, no leg swelling Respiratory: no cough, no shortness of breath Gastrointestinal: no nausea/vomiting/diarrhea Musculoskeletal: no muscle/joint aches Skin: no rashes, no hyperemia Neurological: no tremors, no numbness, no  tingling, no dizziness Psychiatric: no depression, no anxiety  Objective:     BP 128/74 (BP  Location: Right Arm, Patient Position: Sitting, Cuff Size: Normal)   Pulse 64   Ht 5\' 2"  (1.575 m)   Wt 168 lb 12.8 oz (76.6 kg)   BMI 30.87 kg/m   Wt Readings from Last 3 Encounters:  12/19/22 168 lb 12.8 oz (76.6 kg)  09/07/22 168 lb 9.6 oz (76.5 kg)  06/21/22 168 lb 3.2 oz (76.3 kg)     BP Readings from Last 3 Encounters:  12/19/22 128/74  09/07/22 139/85  08/03/22 (!) 156/68     Physical Exam- Limited  Constitutional:  Body mass index is 30.87 kg/m. , not in acute distress, normal state of mind Eyes:  EOMI, no exophthalmos Musculoskeletal: no gross deformities, strength intact in all four extremities, no gross restriction of joint movements Skin:  no rashes, no hyperemia Neurological: no tremor with outstretched hands   Diabetic Foot Exam - Simple   No data filed     CMP ( most recent) CMP     Component Value Date/Time   NA 138 01/17/2022 1427   NA 137 08/10/2021 1132   K 4.1 01/17/2022 1427   CL 103 01/17/2022 1427   CO2 23 01/17/2022 1427   GLUCOSE 261 (H) 01/17/2022 1427   BUN 43 (H) 01/17/2022 1427   BUN 36 (H) 08/10/2021 1132   CREATININE 1.43 (H) 01/17/2022 1427   CALCIUM 9.5 01/17/2022 1427   PROT 6.4 (L) 07/19/2021 0513   PROT 7.5 06/22/2021 1501   ALBUMIN 3.3 (L) 07/19/2021 0513   ALBUMIN 4.6 06/22/2021 1501   AST 84 (H) 07/19/2021 0513   ALT 119 (H) 07/19/2021 0513   ALKPHOS 131 (H) 07/19/2021 0513   BILITOT 0.9 07/19/2021 0513   BILITOT 0.8 06/22/2021 1501   GFRNONAA 40 (L) 01/17/2022 1427   GFRAA >60 12/16/2019 1214     Diabetic Labs (most recent): Lab Results  Component Value Date   HGBA1C 7.1 (A) 12/19/2022   HGBA1C 10.0 (A) 06/22/2021   HGBA1C 9.8 (H) 11/19/2019     Lipid Panel ( most recent) Lipid Panel     Component Value Date/Time   CHOL 113 06/22/2021 1501   TRIG 110 06/22/2021 1501   HDL 33 (L) 06/22/2021 1501   CHOLHDL  3.4 06/22/2021 1501   LDLCALC 60 06/22/2021 1501   LABVLDL 20 06/22/2021 1501      Lab Results  Component Value Date   TSH 8.127 (H) 07/13/2021   TSH 7.020 (H) 06/22/2021   TSH 0.841 11/19/2019   TSH 14.720 (H) 03/18/2018   TSH 2.610 10/18/2016   TSH 0.978 10/11/2015   TSH 40.220 (H) 06/02/2015   TSH 3.400 01/15/2015   TSH 35.980 (H) 01/01/2015   TSH 2.36 12/31/2013   FREET4 0.65 07/13/2021   FREET4 1.50 01/15/2015           Assessment & Plan:   1) 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  Her POCT A1c today is 7.1%, improving from last visit of 7.4%.  Analysis of her meter shows 7-day average of 164, 14-day average of 167, 30-day average of 174.  She denies any hypoglycemia.  She is interested in coming off the Jardiance due to yeast in abdominal folds.  - Michele [name] has currently uncontrolled symptomatic type 2 DM since 67 years of age.   -Recent labs reviewed.  - I had a long discussion with her about the progressive nature of  diabetes and the pathology behind its complications. -her diabetes is complicated by CKD stage 3b, CHF, neuropathy and she remains at a high risk for more acute and chronic complications which include CAD, CVA, CKD, retinopathy, and neuropathy. These are all discussed in detail with her.  The following Lifestyle Medicine recommendations according to American College of Lifestyle Medicine [facility]) were discussed and offered to patient and she agrees to start the journey:  A. Whole Foods, Plant-based plate comprising of fruits and vegetables, plant-based proteins, whole-grain carbohydrates was discussed in detail with the patient.   A list for source of those nutrients were also provided to the patient.  Patient will use only water or unsweetened tea for hydration. B.  The need to stay away from risky substances including alcohol, smoking;  obtaining 7 to 9 hours of restorative sleep, at least 150 minutes of moderate intensity exercise weekly, the importance of healthy social connections,  and stress reduction techniques were discussed. C.  A full color page of  Calorie density of various food groups per pound showing examples of each food groups was provided to the patient.  - Nutritional counseling repeated at each appointment due to patients tendency to fall back in to old habits.  - The patient admits there is a room for improvement in their diet and drink choices. -  Suggestion is made for the patient to avoid simple carbohydrates from their diet including Cakes, Sweet Desserts / Pastries, Ice Cream, Soda (diet and regular), Sweet Tea, Candies, Chips, Cookies, Sweet Pastries, Store Bought Juices, Alcohol in Excess of 1-2 drinks a day, Artificial Sweeteners, Coffee Creamer, and "Sugar-free" Products. This will help patient to have stable blood glucose profile and potentially avoid unintended weight gain.   - I encouraged the patient to switch to unprocessed or minimally processed complex starch and increased protein intake (animal or plant source), fruits, and vegetables.   - Patient is advised to stick to a routine mealtimes to eat 3 meals a day and avoid unnecessary snacks (to snack only to correct hypoglycemia).  - I have approached her with the following individualized plan to manage her diabetes and patient agrees:   -She is advised to increase her Lantus to 50 units SQ nightly to get her fasting numbers closer to goal.  I discontinued her Jardiance today (cost and abdominal yeast) and started her on low dose Glipizide 5 mg XL daily with breakfast.  -she is encouraged to continue monitoring glucose 4 times daily (using her CGM), before meals and before bed, and to call the clinic if she has readings less than 70 or above 300 for 3 tests in a row.  - she is warned not to take insulin without proper monitoring per orders. -  Adjustment parameters are given to her for hypo and hyperglycemia in writing.  - Specific targets for  A1c; LDL, HDL, and Triglycerides were discussed with the patient.  2) Blood Pressure /Hypertension:  her blood pressure is controlled to target.   she is advised to continue her current medications including Losartan 12.5 mg p.o. daily with breakfast, Metoprolol 25 mg po daily, Demadex 20 mg po daily and Spironolactone 12.5 mg po daily.  3) Lipids/Hyperlipidemia:    Review of her recent lipid panel from 11/15/21 showed controlled LDL at 74 and elevated triglycerides of 229 .  she is advised to continue Crestor 10 mg daily at bedtime.  Side effects and precautions discussed with her.  Will recheck lipid panel prior to next visit.  4)  Weight/Diet:  her Body mass index is 30.87 kg/m.  -  clearly complicating her diabetes care.   she is a candidate for weight loss. I discussed with her the fact that loss of 5 - 10% of her  current body weight will have the most impact on her diabetes management.  Exercise, and detailed carbohydrates information provided  -  detailed on discharge instructions.  5) Hypothyroidism The details of her diagnosis are not available to review.  She is currently on Levothyroxine 150 mcg po daily before breakfast.  She says this was dropped recently due to interference with Amiodarone causing her thyroid to increase activity.  Will recheck prior to next visit.  6) Chronic Care/Health Maintenance: -she is on ACEI/ARB and Statin medications and is encouraged to initiate and continue to follow up with Ophthalmology, Dentist, Podiatrist at least yearly or according to recommendations, and advised to stay away from smoking. I have recommended yearly flu vaccine and pneumonia vaccine at least every 5 years; moderate intensity exercise for up to 150 minutes weekly; and sleep for at least 7 hours a day.  - she is advised to maintain close follow up with Bosie Clos, MD for  primary care needs, as well as her other providers for optimal and coordinated care.     I spent  33  minutes in the care of the patient today including review of labs from CMP, Lipids, Thyroid Function, Hematology (current and previous including abstractions from other facilities); face-to-face time discussing  her blood glucose readings/logs, discussing hypoglycemia and hyperglycemia episodes and symptoms, medications doses, her options of short and long term treatment based on the latest standards of care / guidelines;  discussion about incorporating lifestyle medicine;  and documenting the encounter. Risk reduction counseling performed per USPSTF guidelines to reduce obesity and cardiovascular risk factors.     Please refer to Patient Instructions for Blood Glucose Monitoring and Insulin/Medications Dosing Guide"  in media tab for additional information. Please  also refer to " Patient Self Inventory" in the Media  tab for reviewed elements of pertinent patient history.  Noralee Chars participated in the discussions, expressed understanding, and voiced agreement with the above plans.  All questions were answered to her satisfaction. she is encouraged to contact clinic should she have any questions or concerns prior to her return visit.     Follow up plan: - Return in about 4 months (around 04/20/2023) for Diabetes F/U with A1c in office, Thyroid follow up, Previsit labs, Bring meter and logs.   Ronny Bacon, [facility] [address] [place], [place] [address] Phone: [phone] Fax: [phone]  12/19/2022, 11:51 AM

## 2022-12-19 NOTE — Patient Instructions (Signed)

## 2022-12-24 ENCOUNTER — Other Ambulatory Visit: Payer: Self-pay | Admitting: Nurse Practitioner

## 2022-12-25 NOTE — Progress Notes (Unsigned)
Cardiology Office Note  Date:  12/26/2022   ID:  Michele [name], DOB [birth year], MRN [id number]  PCP:  Bosie Clos, MD   Chief Complaint  Patient presents with   6 month follow up     "Doing well." Medications reviewed by the patient verbally.     HPI:  [name] is a  67 year old woman with past medical history of morbid obesity,  coronary artery disease with 50% proximal LAD disease , followed by a 30% lesion by catheterization July 2009,  diabetes, hemoglobin A1c, poorly controlled hyperlipidemia,  ejection fraction 30% by echocardiogram in June 2009,  moderately dilated left atrium and left ventricle,  obstructive sleep apnea and wears CPAP. NICM Echo 7/23: Ef 50 to 55% Chronic renal insufficiency stage III a Who presents for f/u of her  coronary artery disease, hyperlipidemia , Afib  Last seen in clinic by myself June 2023 Seen by one of our providers 12/23  Lives in home at [employer] Walking more with her sister Activity limited by bad knee No edema, denies PND orthopnea Weight stable 169  Reports compliance with her medications Followed by endocrine up in [facility]  Denies tachypalpitations concerning for atrial fibrillation Tolerating beta-blocker with amiodarone  Creatinine 1.7 Potassium 3.8, unable to swallow potassium pills, upsets her stomach  EKG personally reviewed by myself on todays visit Normal sinus rhythm rate 57 bpm nonspecific ST abnormality  Other past medical history reviewed  07/2021, progressive weakness and dyspnea w/ finding of AFib w/ RVR.  Hospital admission to [facility] due to rapid afib and acute hypoxic resp failure.  CHF was noted on CTA chest.   PE could not be excluded.   hypotensive and required norepinephrine.   Echocardiogram on January 5 showed EF of less than 20% with global hypokinesis.  The inferior and inferolateral walls were best preserved.  The right ventricular function was moderately reduced with small pericardial  effusion, and mild to moderate mitral regurgitation.    right and left heart cardiac catheterization on July 15 2021 showing moderately elevated filling pressures and severely reduced cardiac output (2.19) and index (1.13).   70% stenosis in the mid LAD, which was not felt to be flow-limiting and continued IV diuresis with initiation of milrinone was recommended, as well as TEE cardioversion.  Dyspnea improved with diuresis and she was eventually able to be weaned off of norepinephrine.    TEE and cardioversion was successfully performed on January 10, with restoration of sinus rhythm.   Amiodarone therapy was initiated.   Milrinone was discontinued on January 11, and beta-blocker and ARB therapy were added.   transitioned to oral torsemide , discharged home on January 15 at 193 pounds.  In heart failure clinic follow-up on January 17,  torsemide dose was reduced to 40 mg daily   PMH:   has a past medical history of Abscess of breast, right (08/19/2012), Breast cancer (HCC) (07/13/2010), Coronary artery disease, DCIS (ductal carcinoma in situ) of breast, right (2012), Diabetes mellitus (HCC), GERD (gastroesophageal reflux disease), HFrEF (heart failure with reduced ejection fraction) (HCC), Hyperlipidemia, Hypertension, Ischemic cardiomyopathy, Obesity, unspecified, Persistent atrial fibrillation (HCC), Personal history of radiation therapy, Sleep apnea, Thyroid disease, and Umbilical hernia without mention of obstruction or gangrene.  PSH:    Past Surgical History:  Procedure Laterality Date   BREAST BIOPSY Right 2007   right bx neg   BREAST BIOPSY Right 08/10/2016   fat necrosis/ done in Dr. Rutherford Nail office   BREAST EXCISIONAL BIOPSY Right 2011  DCIS mammosite lumpectomy   BREAST LUMPECTOMY Right Jan 2012   Wide excision   BREAST MASS EXCISION Right 2011   December   CARDIAC CATHETERIZATION  01/08/2008   CHOLECYSTECTOMY  2013   [facility]   HERNIA REPAIR  2013    epigastric   INCISION AND DRAINAGE BREAST ABSCESS Right 08/19/2012   RIGHT/LEFT HEART CATH AND CORONARY ANGIOGRAPHY N/A 07/15/2021   Procedure: RIGHT/LEFT HEART CATH AND CORONARY ANGIOGRAPHY;  Surgeon: Iran Ouch, MD;  Location: [facility] INVASIVE CV LAB;  Service: Cardiovascular;  Laterality: N/A;   TEE WITHOUT CARDIOVERSION N/A 07/19/2021   Procedure: TRANSESOPHAGEAL ECHOCARDIOGRAM (TEE);  Surgeon: Antonieta Iba, MD;  Location: [facility] ORS;  Service: Cardiovascular;  Laterality: N/A;   TONSILLECTOMY AND ADENOIDECTOMY     age 62 yrs    Current Outpatient Medications  Medication Sig Dispense Refill   amiodarone (PACERONE) 200 MG tablet TAKE 1 TABLET BY MOUTH EVERY DAY 90 tablet 0   Blood Glucose Monitoring Suppl (ONE TOUCH ULTRA SYSTEM KIT) w/Device KIT Check sugar once daily. DX E11.9 1 each 0   digoxin (LANOXIN) 0.125 MG tablet Take 0.0625 mg by mouth daily.     ELIQUIS 5 MG TABS tablet TAKE 1 TABLET BY MOUTH TWICE A DAY 60 tablet 5   fluticasone (FLONASE) 50 MCG/ACT nasal spray Place 2 sprays into the nose daily.     glipiZIDE (GLUCOTROL XL) 5 MG 24 hr tablet Take 1 tablet (5 mg total) by mouth daily with breakfast. 90 tablet 1   glucose blood (ONETOUCH ULTRA) test strip Use to check blood glucose four times daily as directed 100 strip 3   insulin glargine (LANTUS SOLOSTAR) 100 UNIT/ML Solostar Pen Inject 50 Units into the skin at bedtime. 45 mL 3   Insulin Pen Needle 32G X 4 MM MISC Inject twice daily, SQ. DX E11.9 200 each 3   Lancets (ONETOUCH ULTRASOFT) lancets Check sugar once daily DX E11.9 100 each 3   levothyroxine (SYNTHROID) 150 MCG tablet Take 150 mcg by mouth every morning.     losartan (COZAAR) 25 MG tablet Take 0.5 tablets (12.5 mg total) by mouth daily. 45 tablet 3   metoprolol succinate (TOPROL-XL) 25 MG 24 hr tablet TAKE 1 TABLET (25 MG TOTAL) BY MOUTH DAILY. 90 tablet 0   NON FORMULARY CPAP AT BEDTIME     rosuvastatin (CRESTOR) 10 MG tablet Take 1 tablet (10 mg total)  by mouth daily. 90 tablet 3   spironolactone (ALDACTONE) 25 MG tablet TAKE 1/2 TABLET BY MOUTH EVERY DAY 45 tablet 0   torsemide (DEMADEX) 20 MG tablet TAKE 1 TABLET BY MOUTH EVERY DAY 90 tablet 0   No current facility-administered medications for this visit.     Allergies:   Entresto [sacubitril-valsartan]   Social History:  The patient  reports that she has never smoked. She has never used smokeless tobacco. She reports that she does not drink alcohol and does not use drugs.   Family History:   family history includes Arthritis in her father; CVA in her mother; Congestive Heart Failure in her mother; Diabetes in her mother and sister; Heart attack in her father, maternal grandfather, and paternal grandmother; Heart failure in her mother; Hemochromatosis in her brother; Hypertension in her brother, brother, and sister; Lung cancer in her sister; Stroke in her maternal grandmother.    Review of Systems: Review of Systems  Constitutional: Negative.   Respiratory: Negative.    Cardiovascular: Negative.  Gastrointestinal: Negative.   Musculoskeletal: Negative.   Neurological: Negative.   Psychiatric/Behavioral: Negative.    All other systems reviewed and are negative.    PHYSICAL EXAM: VS:  BP 120/64 (BP Location: Left Arm, Patient Position: Sitting, Cuff Size: Normal)   Pulse (!) 57   Ht 5\' 2"  (1.575 m)   Wt 169 lb 2 oz (76.7 kg)   SpO2 97%   BMI 30.93 kg/m  , BMI Body mass index is 30.93 kg/m. Constitutional:  oriented to person, place, and time. No distress.  HENT:  Head: Grossly normal Eyes:  no discharge. No scleral icterus.  Neck: No JVD, no carotid bruits  Cardiovascular: Regular rate and rhythm, no murmurs appreciated Pulmonary/Chest: Clear to auscultation bilaterally, no wheezes or rails Abdominal: Soft.  no distension.  no tenderness.  Musculoskeletal: Normal range of motion Neurological:  normal muscle tone. Coordination normal. No atrophy Skin: Skin warm and  dry Psychiatric: normal affect, pleasant  Recent Labs: 01/17/2022: BUN 43; Creatinine, Ser 1.43; Potassium 4.1; Sodium 138    Lipid Panel Lab Results  Component Value Date   CHOL 113 06/22/2021   HDL 33 (L) 06/22/2021   LDLCALC 60 06/22/2021   TRIG 110 06/22/2021    Wt Readings from Last 3 Encounters:  12/26/22 169 lb 2 oz (76.7 kg)  12/19/22 168 lb 12.8 oz (76.6 kg)  09/07/22 168 lb 9.6 oz (76.5 kg)     ASSESSMENT AND PLAN:  Atherosclerosis of native coronary artery of native heart with stable angina pectoris (HCC) -  Currently with no symptoms of angina. No further workup at this time. Continue current medication regimen.  Type 2 diabetes mellitus with other circulatory complication, with long-term current use of insulin [facility]) Working with endocrinology, A1c trending down 7.1 in June 2024  Atrial fibrillation with RVR Recommend she continue succinate 25 daily, Eliquis, digoxin, amiodarone 200 daily Holding normal sinus rhythm  Other hyperlipidemia Cholesterol is at goal on the current lipid regimen. No changes to the medications were made.  Cardiomyopathy, nonischemic By maintaining normal sinus rhythm, on her current medications, ejection fraction close to normalized 50-55% Recommend she increase spironolactone up to 25 mg daily up from 12.5 given potassium borderline low and does not want potassium supplement Continue other medications as detailed above  PVC (premature ventricular contraction) - Plan: EKG 12-Lead Asymptomatic, no further work-up  Morbid obesity Weight trending down, stable    Total encounter time more than 30 minutes  Greater than 50% was spent in counseling and coordination of care with the patient   No orders of the defined types were placed in this encounter.   Signed, Dossie Arbour, M.D., Ph.D. 12/26/2022  [facility] [place], [place] [phone]

## 2022-12-26 ENCOUNTER — Encounter: Payer: Self-pay | Admitting: Cardiovascular Disease

## 2022-12-26 ENCOUNTER — Ambulatory Visit: Payer: Medicare Other | Attending: Cardiovascular Disease | Admitting: Cardiovascular Disease

## 2022-12-26 VITALS — BP 120/64 | HR 57 | Ht 62.0 in | Wt 169.1 lb

## 2022-12-26 DIAGNOSIS — I480 Paroxysmal atrial fibrillation: Secondary | ICD-10-CM | POA: Insufficient documentation

## 2022-12-26 DIAGNOSIS — I10 Essential (primary) hypertension: Secondary | ICD-10-CM | POA: Diagnosis present

## 2022-12-26 DIAGNOSIS — I5022 Chronic systolic (congestive) heart failure: Secondary | ICD-10-CM | POA: Insufficient documentation

## 2022-12-26 DIAGNOSIS — E782 Mixed hyperlipidemia: Secondary | ICD-10-CM | POA: Diagnosis present

## 2022-12-26 DIAGNOSIS — N1831 Chronic kidney disease, stage 3a: Secondary | ICD-10-CM | POA: Diagnosis present

## 2022-12-26 DIAGNOSIS — I25118 Atherosclerotic heart disease of native coronary artery with other forms of angina pectoris: Secondary | ICD-10-CM | POA: Diagnosis present

## 2022-12-26 DIAGNOSIS — E1122 Type 2 diabetes mellitus with diabetic chronic kidney disease: Secondary | ICD-10-CM | POA: Diagnosis present

## 2022-12-26 DIAGNOSIS — Z794 Long term (current) use of insulin: Secondary | ICD-10-CM | POA: Insufficient documentation

## 2022-12-26 DIAGNOSIS — I428 Other cardiomyopathies: Secondary | ICD-10-CM | POA: Insufficient documentation

## 2022-12-26 MED ORDER — SPIRONOLACTONE 25 MG PO TABS: 25.0000 mg | ORAL_TABLET | Freq: Every day | ORAL | 3 refills | Status: DC

## 2022-12-26 NOTE — Patient Instructions (Signed)
Medication Instructions:  Please increase the spironolactone up to 25 mg daily  If you need a refill on your cardiac medications before your next appointment, please call your pharmacy.   Lab work: No new labs needed  Testing/Procedures: No new testing needed  Follow-Up: At [facility], you and your health needs are our priority.  As part of our continuing mission to provide you with exceptional heart care, we have created designated Provider Care Teams.  These Care Teams include your primary Cardiologist (physician) and Advanced Practice Providers (APPs -  Physician Assistants and Nurse Practitioners) who all work together to provide you with the care you need, when you need it.  You will need a follow up appointment in 6 months, APP ok  Providers on your designated Care Team:   Nicolasa Ducking, NP Eula Listen, PA-C Cadence Fransico [place], [place]  COVID-19 Vaccine Information can be found at: [email or web address] For questions related to vaccine distribution or appointments, please email [email or web address] or call 9297224285.

## 2023-01-18 ENCOUNTER — Ambulatory Visit
Admission: RE | Admit: 2023-01-18 | Discharge: 2023-01-18 | Disposition: A | Discharge status: Home / Self Care | Payer: Medicare Other | Source: Ambulatory Visit | Attending: General Surgery | Admitting: General Surgery

## 2023-01-18 DIAGNOSIS — Z1231 Encounter for screening mammogram for malignant neoplasm of breast: Secondary | ICD-10-CM | POA: Insufficient documentation

## 2023-02-03 ENCOUNTER — Other Ambulatory Visit: Payer: Self-pay | Admitting: Cardiovascular Disease

## 2023-02-08 ENCOUNTER — Other Ambulatory Visit: Payer: Self-pay | Admitting: Cardiovascular Disease

## 2023-02-08 DIAGNOSIS — I4891 Unspecified atrial fibrillation: Secondary | ICD-10-CM

## 2023-02-13 ENCOUNTER — Other Ambulatory Visit: Payer: Self-pay | Admitting: Cardiovascular Disease

## 2023-03-19 ENCOUNTER — Other Ambulatory Visit: Payer: Self-pay | Admitting: Nurse Practitioner

## 2023-03-21 ENCOUNTER — Other Ambulatory Visit: Payer: Self-pay | Admitting: Nurse Practitioner

## 2023-04-02 ENCOUNTER — Ambulatory Visit: Payer: Medicare Other | Admitting: Podiatry

## 2023-04-04 ENCOUNTER — Other Ambulatory Visit: Payer: Self-pay | Admitting: Cardiovascular Disease

## 2023-04-04 NOTE — Telephone Encounter (Signed)
Prescription refill request for Eliquis received. Indication: AF Last office visit: 12/26/22  Concha Se MD Scr: 1.74 on 07/21/22  Epic Age: 67 Weight: 76.7kg  Based on above findings Eliquis 5mg  twice daily is the appropriate dose.  Refill approved.

## 2023-04-12 ENCOUNTER — Ambulatory Visit (INDEPENDENT_AMBULATORY_CARE_PROVIDER_SITE_OTHER): Payer: Medicare Other | Admitting: Podiatry

## 2023-04-12 DIAGNOSIS — M2141 Flat foot [pes planus] (acquired), right foot: Secondary | ICD-10-CM

## 2023-04-12 DIAGNOSIS — M79675 Pain in left toe(s): Secondary | ICD-10-CM | POA: Diagnosis not present

## 2023-04-12 DIAGNOSIS — E119 Type 2 diabetes mellitus without complications: Secondary | ICD-10-CM | POA: Diagnosis not present

## 2023-04-12 DIAGNOSIS — B351 Tinea unguium: Secondary | ICD-10-CM | POA: Diagnosis not present

## 2023-04-12 DIAGNOSIS — E1142 Type 2 diabetes mellitus with diabetic polyneuropathy: Secondary | ICD-10-CM | POA: Diagnosis not present

## 2023-04-12 DIAGNOSIS — M2012 Hallux valgus (acquired), left foot: Secondary | ICD-10-CM

## 2023-04-12 DIAGNOSIS — M2142 Flat foot [pes planus] (acquired), left foot: Secondary | ICD-10-CM

## 2023-04-12 DIAGNOSIS — M2011 Hallux valgus (acquired), right foot: Secondary | ICD-10-CM | POA: Diagnosis not present

## 2023-04-12 DIAGNOSIS — M79674 Pain in right toe(s): Secondary | ICD-10-CM

## 2023-04-12 DIAGNOSIS — Z794 Long term (current) use of insulin: Secondary | ICD-10-CM

## 2023-04-16 ENCOUNTER — Encounter: Payer: Self-pay | Admitting: Podiatry

## 2023-04-16 ENCOUNTER — Telehealth: Payer: Self-pay | Admitting: Nurse Practitioner

## 2023-04-16 DIAGNOSIS — E119 Type 2 diabetes mellitus without complications: Secondary | ICD-10-CM

## 2023-04-16 NOTE — Telephone Encounter (Signed)
I put some in.

## 2023-04-16 NOTE — Progress Notes (Signed)
ANNUAL DIABETIC FOOT EXAM  Subjective: Michele [name] presents today annual diabetic foot exam.  Chief Complaint  Patient presents with   Diabetes    DFC BS-120 A1C-7.1 PCPV-12/2022    Patient confirms h/o diabetes.  Patient denies any h/o foot wounds.  Patient has been diagnosed with neuropathy.  Risk factors: diabetes, HTN, CAD, CHF, hyperlipidemia.  Bosie Clos, MD is patient's PCP.  Past Medical History:  Diagnosis Date   Abscess of breast, right 08/19/2012   Breast cancer (HCC) 07/13/2010   1.5 cm,intermediate grade DCIS, nuclear grade 2, ER 90%, PR 90% treated with wide excision, reexcision to negative margins and Mammo[employer] partial breast radiation.   Coronary artery disease    a. 2009 Cath: nonobs LAD dzs; b. 07/2021 Cath: LM nl, LAD 64m, D1 mild dzs, D2 40, LCX nl, RCA 40p. PCWP 24. PA 37/26(30).   DCIS (ductal carcinoma in situ) of breast, right 2012   Diabetes mellitus (HCC)    Type II   GERD (gastroesophageal reflux disease)    HFrEF (heart failure with reduced ejection fraction) (HCC)    a. 2009 Echo: LV dysfxn; b. 2013 Echo: EF 50-55%; c. 07/2021 Echo: EF<20%, glob HK - in/inflat moves best. Nl PASP. Sev dil LA. Mildly dil RA. Mild-mod MR; d. 01/2022 Echo: EF 50-55%, no rwma, mod LVH, GrI DD, nl RV fxn.   Hyperlipidemia    Hypertension    Ischemic cardiomyopathy    a. 2009 Echo: LV dysfxn; b. 2013 Echo: EF 50-55%; c. 07/2021 Echo: EF<20% d. 01/2022 Echo: EF 50-55%, no rwma, mod LVH, GrI DD, nl RV fxn.   Obesity, unspecified    Persistent atrial fibrillation (HCC)    a. 07/2021 s/p TEE and DCCV (150J)-->amio/eliquis (CHA2DS2VASc 6).   Personal history of radiation therapy    Sleep apnea    Thyroid disease    hypothyroidism   Umbilical hernia without mention of obstruction or gangrene    Patient Active Problem List   Diagnosis Date Noted   Blood clotting disorder (HCC) 12/01/2021   Hospital discharge follow-up 07/29/2021   Coronary artery disease  involving native heart    Acute systolic heart failure (HCC)    Cardiogenic shock (HCC)    Pressure injury of skin 07/14/2021   Dilated cardiomyopathy (HCC) 07/14/2021   Dyspnea on exertion 07/13/2021   Atrial fibrillation (HCC) 07/13/2021   Atrial fibrillation with RVR (HCC) 07/13/2021   Fatigue due to excessive exertion 07/13/2021   Fissure in skin of foot 07/13/2021   Sinus congestion 07/13/2021   Acute respiratory failure with hypoxia (HCC) 07/13/2021   OSA (obstructive sleep apnea) 06/22/2021   CHF (congestive heart failure) (HCC) 06/22/2021   Neutrophilia 12/16/2019   PVC (premature ventricular contraction) 05/14/2018   Fat necrosis (segmental) of breast 12/30/2015   Allergic rhinitis 11/28/2014   Acid reflux 11/28/2014   Adult hypothyroidism 11/28/2014   DCIS (ductal carcinoma in situ) 06/03/2014   Diabetes mellitus (HCC) 03/14/2012   Shortness of breath 12/13/2011   Atherosclerosis of native coronary artery of native heart with stable angina pectoris (HCC) 12/13/2011   Morbid obesity (HCC) 12/13/2011   Hyperlipidemia 12/13/2011   Hypertension 12/13/2011   Past Surgical History:  Procedure Laterality Date   BREAST BIOPSY Right 2007   right bx neg   BREAST BIOPSY Right 08/10/2016   fat necrosis/ done in Dr. Rutherford Nail office   BREAST EXCISIONAL BIOPSY Right 2011   DCIS mammosite lumpectomy   BREAST LUMPECTOMY Right Jan 2012   Wide excision  BREAST MASS EXCISION Right 2011   December   CARDIAC CATHETERIZATION  01/08/2008   CHOLECYSTECTOMY  2013   [facility]   HERNIA REPAIR  2013   epigastric   INCISION AND DRAINAGE BREAST ABSCESS Right 08/19/2012   RIGHT/LEFT HEART CATH AND CORONARY ANGIOGRAPHY N/A 07/15/2021   Procedure: RIGHT/LEFT HEART CATH AND CORONARY ANGIOGRAPHY;  Surgeon: Iran Ouch, MD;  Location: [facility] INVASIVE CV LAB;  Service: Cardiovascular;  Laterality: N/A;   TEE WITHOUT CARDIOVERSION N/A 07/19/2021   Procedure:  TRANSESOPHAGEAL ECHOCARDIOGRAM (TEE);  Surgeon: Antonieta Iba, MD;  Location: [facility] ORS;  Service: Cardiovascular;  Laterality: N/A;   TONSILLECTOMY AND ADENOIDECTOMY     age 67 yrs   Current Outpatient Medications on File Prior to Visit  Medication Sig Dispense Refill   amiodarone (PACERONE) 200 MG tablet TAKE 1 TABLET BY MOUTH EVERY DAY 90 tablet 0   Blood Glucose Monitoring Suppl (ONE TOUCH ULTRA SYSTEM KIT) w/Device KIT Check sugar once daily. DX E11.9 1 each 0   digoxin (LANOXIN) 0.125 MG tablet Take 0.0625 mg by mouth daily.     ELIQUIS 5 MG TABS tablet TAKE 1 TABLET BY MOUTH TWICE A DAY 60 tablet 5   fluticasone (FLONASE) 50 MCG/ACT nasal spray Place 2 sprays into the nose daily.     glipiZIDE (GLUCOTROL XL) 5 MG 24 hr tablet TAKE 1 TABLET BY MOUTH EVERY DAY WITH BREAKFAST 90 tablet 1   glucose blood (ONETOUCH ULTRA) test strip Use to check blood glucose four times daily as directed 100 strip 3   insulin glargine (LANTUS SOLOSTAR) 100 UNIT/ML Solostar Pen Inject 50 Units into the skin at bedtime. 45 mL 3   Insulin Pen Needle 32G X 4 MM MISC Inject twice daily, SQ. DX E11.9 200 each 3   Lancets (ONETOUCH ULTRASOFT) lancets Check sugar once daily DX E11.9 100 each 3   levothyroxine (SYNTHROID) 150 MCG tablet Take 150 mcg by mouth every morning.     losartan (COZAAR) 25 MG tablet TAKE 1/2 TABLET BY MOUTH EVERY DAY 45 tablet 0   metoprolol succinate (TOPROL-XL) 25 MG 24 hr tablet TAKE 1 TABLET (25 MG TOTAL) BY MOUTH DAILY. 90 tablet 1   NON FORMULARY CPAP AT BEDTIME     rosuvastatin (CRESTOR) 10 MG tablet Take 1 tablet (10 mg total) by mouth daily. 90 tablet 3   spironolactone (ALDACTONE) 25 MG tablet Take 1 tablet (25 mg total) by mouth daily. 90 tablet 3   torsemide (DEMADEX) 20 MG tablet TAKE 1 TABLET BY MOUTH EVERY DAY 90 tablet 0   No current facility-administered medications on file prior to visit.    Allergies  Allergen Reactions   Entresto [Sacubitril-Valsartan] Other (See  Comments)    hypotension   Social History   Occupational History   Occupation: retired    Associate Professor: [employer]  Tobacco Use   Smoking status: Never   Smokeless tobacco: Never  Vaping Use   Vaping status: Never Used  Substance and Sexual Activity   Alcohol use: No   Drug use: No   Sexual activity: Not on file   Family History  Problem Relation Age of Onset   Heart failure Mother    Diabetes Mother    Congestive Heart Failure Mother    CVA Mother    Heart attack Father    Arthritis Father    Diabetes Sister    Hypertension Sister    Lung cancer Sister  Hypertension Brother    Hypertension Brother    Hemochromatosis Brother    Stroke Maternal Grandmother    Heart attack Maternal Grandfather    Heart attack Paternal Grandmother    Breast cancer Neg Hx    Immunization History  Administered Date(s) Administered   PNEUMOCOCCAL CONJUGATE-20 06/22/2021   Pneumococcal Polysaccharide-23 05/13/2009   Tdap 05/13/2009     Review of Systems: Negative except as noted in the HPI.   Objective: There were no vitals filed for this visit.  Michele [name] is a pleasant 67 y.o. female in NAD. AAO X 3.  Vascular Examination: CFT <3 seconds b/l. DP/PT pulses faintly palpable b/l. Skin temperature gradient warm to warm b/l. No pain with calf compression. No ischemia or gangrene. No cyanosis or clubbing noted b/l.    Neurological Examination: Protective sensation intact with 10 gram monofilament right foot. Protective sensation diminished with 10 gram monofilament left foot.  Dermatological Examination: Pedal skin warm and supple b/l.   No open wounds. No interdigital macerations.  Toenails 1-5 b/l thick, discolored, elongated with subungual debris and pain on dorsal palpation.    No corns, calluses nor porokeratotic lesions noted.  Musculoskeletal Examination: Muscle strength 5/5 to all lower extremity muscle groups bilaterally. DJD dorsal midfoot right LE. HAV with bunion  deformity noted b/l LE.  Radiographs: None  Last A1c:      Latest Ref Rng & Units 12/19/2022   11:37 AM  Hemoglobin A1C  Hemoglobin-A1c 4.0 - 5.6 % 7.1    ADA Risk Categorization: High Risk  Patient has one or more of the following: Loss of protective sensation Absent pedal pulses Severe Foot deformity History of foot ulcer  Assessment: 1. Pain due to onychomycosis of toenails of both feet   2. Hallux valgus, acquired, bilateral   3. Pes planus of both feet   4. Type 2 diabetes mellitus with diabetic polyneuropathy, with long-term current use of insulin (HCC)   5. Encounter for diabetic foot exam (HCC)     Plan: -Consent given for treatment as described below: -Examined patient. -Diabetic foot examination performed today. -Continue diabetic foot care principles: inspect feet daily, monitor glucose as recommended by PCP and/or Endocrinologist, and follow prescribed diet per PCP, Endocrinologist and/or dietician. -Patient to continue soft, supportive shoe gear daily. -Mycotic toenails 1-5 bilaterally were debrided in length and girth with sterile nail nippers and dremel without incident. -Patient/POA to call should there be question/concern in the interim. No follow-ups on file.  Freddie Breech, DPM

## 2023-04-16 NOTE — Telephone Encounter (Signed)
Do you want labs on pt? No orders are placed.

## 2023-04-18 LAB — COMPREHENSIVE METABOLIC PANEL
ALT: 19 [IU]/L (ref 0–32)
AST: 21 [IU]/L (ref 0–40)
Albumin: 4.1 g/dL (ref 3.9–4.9)
Alkaline Phosphatase: 75 [IU]/L (ref 44–121)
BUN/Creatinine Ratio: 31 — ABNORMAL HIGH (ref 12–28)
BUN: 55 mg/dL — ABNORMAL HIGH (ref 8–27)
Bilirubin Total: 0.4 mg/dL (ref 0.0–1.2)
CO2: 20 mmol/L (ref 20–29)
Calcium: 9.3 mg/dL (ref 8.7–10.3)
Chloride: 101 mmol/L (ref 96–106)
Creatinine, Ser: 1.75 mg/dL — ABNORMAL HIGH (ref 0.57–1.00)
Globulin, Total: 2.7 g/dL (ref 1.5–4.5)
Glucose: 117 mg/dL — ABNORMAL HIGH (ref 70–99)
Potassium: 4.4 mmol/L (ref 3.5–5.2)
Sodium: 139 mmol/L (ref 134–144)
Total Protein: 6.8 g/dL (ref 6.0–8.5)
eGFR: 32 mL/min/{1.73_m2} — ABNORMAL LOW (ref >59)

## 2023-04-18 LAB — LIPID PANEL
Chol/HDL Ratio: 5 {ratio} — ABNORMAL HIGH (ref 0.0–4.4)
Cholesterol, Total: 139 mg/dL (ref 100–199)
HDL: 28 mg/dL — ABNORMAL LOW (ref >39)
LDL Chol Calc (NIH): 73 mg/dL (ref 0–99)
Triglycerides: 227 mg/dL — ABNORMAL HIGH (ref 0–149)
VLDL Cholesterol Cal: 38 mg/dL (ref 5–40)

## 2023-04-18 LAB — TSH: TSH: 1.63 u[IU]/mL (ref 0.450–4.500)

## 2023-04-18 LAB — T4, FREE: Free T4: 1.6 ng/dL (ref 0.82–1.77)

## 2023-04-24 ENCOUNTER — Ambulatory Visit (INDEPENDENT_AMBULATORY_CARE_PROVIDER_SITE_OTHER): Payer: Medicare Other | Admitting: Nurse Practitioner

## 2023-04-24 ENCOUNTER — Encounter: Payer: Self-pay | Admitting: Nurse Practitioner

## 2023-04-24 VITALS — BP 100/60 | HR 61 | Ht 62.0 in | Wt 173.6 lb

## 2023-04-24 DIAGNOSIS — Z794 Long term (current) use of insulin: Secondary | ICD-10-CM

## 2023-04-24 DIAGNOSIS — Z7984 Long term (current) use of oral hypoglycemic drugs: Secondary | ICD-10-CM

## 2023-04-24 DIAGNOSIS — E119 Type 2 diabetes mellitus without complications: Secondary | ICD-10-CM

## 2023-04-24 LAB — POCT GLYCOSYLATED HEMOGLOBIN (HGB A1C): Hemoglobin A1C: 7 % — AB (ref 4.0–5.6)

## 2023-04-24 MED ORDER — LEVOTHYROXINE SODIUM 150 MCG PO TABS
150.0000 ug | ORAL_TABLET | Freq: Every morning | ORAL | 3 refills | Status: AC
Start: 2023-04-24 — End: ?

## 2023-04-24 NOTE — Progress Notes (Signed)
Endocrinology Follow Up Note       04/24/2023, 11:53 AM   Subjective:    Patient ID: Michele [name], female    DOB: [birth year].  Michele [name] is being seen in follow up after being seen in consultation for management of currently uncontrolled symptomatic diabetes requested by  Bosie Clos, MD.   Past Medical History:  Diagnosis Date   Abscess of breast, right 08/19/2012   Breast cancer (HCC) 07/13/2010   1.5 cm,intermediate grade DCIS, nuclear grade 2, ER 90%, PR 90% treated with wide excision, reexcision to negative margins and MammoSite partial breast radiation.   Coronary artery disease    a. 2009 Cath: nonobs LAD dzs; b. 07/2021 Cath: LM nl, LAD 55m, D1 mild dzs, D2 40, LCX nl, RCA 40p. PCWP 24. PA 37/26(30).   DCIS (ductal carcinoma in situ) of breast, right 2012   Diabetes mellitus (HCC)    Type II   GERD (gastroesophageal reflux disease)    HFrEF (heart failure with reduced ejection fraction) (HCC)    a. 2009 Echo: LV dysfxn; b. 2013 Echo: EF 50-55%; c. 07/2021 Echo: EF<20%, glob HK - in/inflat moves best. Nl PASP. Sev dil LA. Mildly dil RA. Mild-mod MR; d. 01/2022 Echo: EF 50-55%, no rwma, mod LVH, GrI DD, nl RV fxn.   Hyperlipidemia    Hypertension    Ischemic cardiomyopathy    a. 2009 Echo: LV dysfxn; b. 2013 Echo: EF 50-55%; c. 07/2021 Echo: EF<20% d. 01/2022 Echo: EF 50-55%, no rwma, mod LVH, GrI DD, nl RV fxn.   Obesity, unspecified    Persistent atrial fibrillation (HCC)    a. 07/2021 s/p TEE and DCCV (150J)-->amio/eliquis (CHA2DS2VASc 6).   Personal history of radiation therapy    Sleep apnea    Thyroid disease    hypothyroidism   Umbilical hernia without mention of obstruction or gangrene     Past Surgical History:  Procedure Laterality Date   BREAST BIOPSY Right 2007   right bx neg   BREAST BIOPSY Right 08/10/2016   fat necrosis/ done in Dr. Rutherford Nail office   BREAST EXCISIONAL  BIOPSY Right 2011   DCIS mammosite lumpectomy   BREAST LUMPECTOMY Right Jan 2012   Wide excision   BREAST MASS EXCISION Right 2011   December   CARDIAC CATHETERIZATION  01/08/2008   CHOLECYSTECTOMY  2013   [facility]   HERNIA REPAIR  2013   epigastric   INCISION AND DRAINAGE BREAST ABSCESS Right 08/19/2012   RIGHT/LEFT HEART CATH AND CORONARY ANGIOGRAPHY N/A 07/15/2021   Procedure: RIGHT/LEFT HEART CATH AND CORONARY ANGIOGRAPHY;  Surgeon: Iran Ouch, MD;  Location: [facility] INVASIVE CV LAB;  Service: Cardiovascular;  Laterality: N/A;   TEE WITHOUT CARDIOVERSION N/A 07/19/2021   Procedure: TRANSESOPHAGEAL ECHOCARDIOGRAM (TEE);  Surgeon: Antonieta Iba, MD;  Location: [facility] ORS;  Service: Cardiovascular;  Laterality: N/A;   TONSILLECTOMY AND ADENOIDECTOMY     age 67 yrs    Social History   Socioeconomic History   Marital status: Widowed    Spouse name: [name]   Number of children: 0   Years of education: Not on file   Highest education level:  Not on file  Occupational History   Occupation: retired    Associate Professor: [employer]  Tobacco Use   Smoking status: Never   Smokeless tobacco: Never  Vaping Use   Vaping status: Never Used  Substance and Sexual Activity   Alcohol use: No   Drug use: No   Sexual activity: Not on file  Other Topics Concern   Not on file  Social History Narrative   Never smoked; no alcohol; lives in [place]; self;    husband passed in 2020.    Retired from [employer].    Good relationship with in-laws - they help when she needs it   Social Determinants of Health   Financial Resource Strain: Low Risk  (10/24/2021)   Overall Financial Resource Strain (CARDIA)    Difficulty of Paying Living Expenses: Not hard at all  Food Insecurity: No Food Insecurity (10/24/2021)   Hunger Vital Sign    Worried About Running Out of Food in the Last Year: Never true    Ran Out of Food in the Last Year: Never true  Transportation Needs: No  Transportation Needs (10/24/2021)   PRAPARE - Administrator, Civil Service (Medical): No    Lack of Transportation (Non-Medical): No  Recent Concern: Transportation Needs - Unmet Transportation Needs (08/05/2021)   PRAPARE - Transportation    Lack of Transportation (Medical): Yes    Lack of Transportation (Non-Medical): Yes  Physical Activity: Insufficiently Active (10/24/2021)   Exercise Vital Sign    Days of Exercise per Week: 5 days    Minutes of Exercise per Session: 20 min  Stress: No Stress Concern Present (10/24/2021)   [employer] of Occupational Health - Occupational Stress Questionnaire    Feeling of Stress : Not at all  Social Connections: Socially Isolated (10/24/2021)   Social Connection and Isolation Panel [NHANES]    Frequency of Communication with Friends and Family: More than three times a week    Frequency of Social Gatherings with Friends and Family: More than three times a week    Attends Religious Services: Never    Database administrator or Organizations: No    Attends Banker Meetings: Never    Marital Status: Widowed    Family History  Problem Relation Age of Onset   Heart failure Mother    Diabetes Mother    Congestive Heart Failure Mother    CVA Mother    Heart attack Father    Arthritis Father    Diabetes Sister    Hypertension Sister    Lung cancer Sister    Hypertension Brother    Hypertension Brother    Hemochromatosis Brother    Stroke Maternal Grandmother    Heart attack Maternal Grandfather    Heart attack Paternal Grandmother    Breast cancer Neg Hx     Outpatient Encounter Medications as of 04/24/2023  Medication Sig   amiodarone (PACERONE) 200 MG tablet TAKE 1 TABLET BY MOUTH EVERY DAY   Blood Glucose Monitoring Suppl (ONE TOUCH ULTRA SYSTEM KIT) w/Device KIT Check sugar once daily. DX E11.9   digoxin (LANOXIN) 0.125 MG tablet Take 0.0625 mg by mouth daily.   ELIQUIS 5 MG TABS tablet TAKE 1 TABLET BY MOUTH  TWICE A DAY   fluticasone (FLONASE) 50 MCG/ACT nasal spray Place 2 sprays into the nose daily.   glipiZIDE (GLUCOTROL XL) 5 MG 24 hr tablet TAKE 1 TABLET BY MOUTH EVERY DAY WITH BREAKFAST   glucose blood (ONETOUCH ULTRA) test  strip Use to check blood glucose four times daily as directed   insulin glargine (LANTUS SOLOSTAR) 100 UNIT/ML Solostar Pen Inject 50 Units into the skin at bedtime.   Insulin Pen Needle 32G X 4 MM MISC Inject twice daily, SQ. DX E11.9   Lancets (ONETOUCH ULTRASOFT) lancets Check sugar once daily DX E11.9   losartan (COZAAR) 25 MG tablet TAKE 1/2 TABLET BY MOUTH EVERY DAY   metoprolol succinate (TOPROL-XL) 25 MG 24 hr tablet TAKE 1 TABLET (25 MG TOTAL) BY MOUTH DAILY.   NON FORMULARY CPAP AT BEDTIME   rosuvastatin (CRESTOR) 10 MG tablet Take 1 tablet (10 mg total) by mouth daily.   spironolactone (ALDACTONE) 25 MG tablet Take 1 tablet (25 mg total) by mouth daily.   torsemide (DEMADEX) 20 MG tablet TAKE 1 TABLET BY MOUTH EVERY DAY   [DISCONTINUED] levothyroxine (SYNTHROID) 150 MCG tablet Take 150 mcg by mouth every morning.   levothyroxine (SYNTHROID) 150 MCG tablet Take 1 tablet (150 mcg total) by mouth every morning.   No facility-administered encounter medications on file as of 04/24/2023.    ALLERGIES: Allergies  Allergen Reactions   Entresto [Sacubitril-Valsartan] Other (See Comments)    hypotension    VACCINATION STATUS: Immunization History  Administered Date(s) Administered   PNEUMOCOCCAL CONJUGATE-20 06/22/2021   Pneumococcal Polysaccharide-23 05/13/2009   Tdap 05/13/2009    Diabetes She presents for her follow-up diabetic visit. She has type 2 diabetes mellitus. Onset time: Diagnosed at approx age of 32. Her disease course has been improving. There are no hypoglycemic associated symptoms. Associated symptoms include foot paresthesias. There are no hypoglycemic complications. Symptoms are stable. Diabetic complications include heart disease (CHF) and  nephropathy. Risk factors for coronary artery disease include diabetes mellitus, dyslipidemia, family history, hypertension, sedentary lifestyle and post-menopausal. Current diabetic treatment includes insulin injections and oral agent (monotherapy). She is compliant with treatment most of the time. Her weight is fluctuating minimally. She is following a generally healthy diet. When asked about meal planning, she reported none. She has had a previous visit with a dietitian. She participates in exercise intermittently. Her home blood glucose trend is decreasing steadily. Her breakfast blood glucose range is generally 130-140 mg/dl. Her overall blood glucose range is 140-180 mg/dl. (She presents today, with her CGM and meter showing improving glycemic profile.  Her POCT A1c today is 7%, improving from last visit of 7.1%.  Analysis of her CGM shows TIR 71%, TAR 29%, TBR 0% with a GMI of 7.1%.  She has done well without the Jardiance.  Did start her on low dose Glipizide to help with postprandial hyperglycemia.  She denies any hypoglycemia.) An ACE inhibitor/angiotensin II receptor blocker is being taken. She sees a podiatrist.Eye exam is current.   Review of systems  Constitutional: + Minimally fluctuating body weight,  current Body mass index is 31.75 kg/m. , no fatigue, no subjective hyperthermia, no subjective hypothermia Eyes: no blurry vision, no xerophthalmia ENT: no sore throat, no nodules palpated in throat, no dysphagia/odynophagia, no hoarseness Cardiovascular: no chest pain, no shortness of breath, no palpitations, no leg swelling Respiratory: no cough, no shortness of breath Gastrointestinal: no nausea/vomiting/diarrhea Musculoskeletal: no muscle/joint aches Skin: no rashes, no hyperemia Neurological: no tremors, no numbness, no tingling, no dizziness Psychiatric: no depression, no anxiety  Objective:     BP 100/60 (BP Location: Right Arm, Patient Position: Sitting, Cuff Size: Large)    Pulse 61   Ht 5\' 2"  (1.575 m)   Wt 173 lb 9.6 oz (78.7  kg)   BMI 31.75 kg/m   Wt Readings from Last 3 Encounters:  04/24/23 173 lb 9.6 oz (78.7 kg)  12/26/22 169 lb 2 oz (76.7 kg)  12/19/22 168 lb 12.8 oz (76.6 kg)     BP Readings from Last 3 Encounters:  04/24/23 100/60  12/26/22 120/64  12/19/22 128/74      Physical Exam- Limited  Constitutional:  Body mass index is 31.75 kg/m. , not in acute distress, normal state of mind Eyes:  EOMI, no exophthalmos Musculoskeletal: no gross deformities, strength intact in all four extremities, no gross restriction of joint movements Skin:  no rashes, no hyperemia Neurological: no tremor with outstretched hands   Diabetic Foot Exam - Simple   No data filed     CMP ( most recent) CMP     Component Value Date/Time   NA 139 04/17/2023 1130   K 4.4 04/17/2023 1130   CL 101 04/17/2023 1130   CO2 20 04/17/2023 1130   GLUCOSE 117 (H) 04/17/2023 1130   GLUCOSE 261 (H) 01/17/2022 1427   BUN 55 (H) 04/17/2023 1130   CREATININE 1.75 (H) 04/17/2023 1130   CALCIUM 9.3 04/17/2023 1130   PROT 6.8 04/17/2023 1130   ALBUMIN 4.1 04/17/2023 1130   AST 21 04/17/2023 1130   ALT 19 04/17/2023 1130   ALKPHOS 75 04/17/2023 1130   BILITOT 0.4 04/17/2023 1130   GFRNONAA 40 (L) 01/17/2022 1427   GFRAA >60 12/16/2019 1214     Diabetic Labs (most recent): Lab Results  Component Value Date   HGBA1C 7.0 (A) 04/24/2023   HGBA1C 7.1 (A) 12/19/2022   HGBA1C 10.0 (A) 06/22/2021     Lipid Panel ( most recent) Lipid Panel     Component Value Date/Time   CHOL 139 04/17/2023 1130   TRIG 227 (H) 04/17/2023 1130   HDL 28 (L) 04/17/2023 1130   CHOLHDL 5.0 (H) 04/17/2023 1130   LDLCALC 73 04/17/2023 1130   LABVLDL 38 04/17/2023 1130      Lab Results  Component Value Date   TSH 1.630 04/17/2023   TSH 8.127 (H) 07/13/2021   TSH 7.020 (H) 06/22/2021   TSH 0.841 11/19/2019   TSH 14.720 (H) 03/18/2018   TSH 2.610 10/18/2016   TSH 0.978  10/11/2015   TSH 40.220 (H) 06/02/2015   TSH 3.400 01/15/2015   TSH 35.980 (H) 01/01/2015   FREET4 1.60 04/17/2023   FREET4 0.65 07/13/2021   FREET4 1.50 01/15/2015           Assessment & Plan:   1) 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  Her POCT A1c today is 7%, improving from last visit of 7.1%.  Analysis of her CGM shows TIR 71%, TAR 29%, TBR 0% with a GMI of 7.1%.  She has done well without the Jardiance.  Did start her on low dose Glipizide to help with postprandial hyperglycemia.  She denies any hypoglycemia.  - Michele [name] has currently uncontrolled symptomatic type 2 DM since 67 years of age.   -Recent labs reviewed.  - I had a long discussion with her about the progressive nature of diabetes and the pathology behind its complications. -her diabetes is complicated by CKD stage 3b, CHF, neuropathy and she remains at a high risk for more acute and chronic complications which include CAD, CVA, CKD, retinopathy, and neuropathy. These are all discussed in detail with  her.  The following Lifestyle Medicine recommendations according to American College of Lifestyle Medicine [facility]) were discussed and offered to patient and she agrees to start the journey:  A. Whole Foods, Plant-based plate comprising of fruits and vegetables, plant-based proteins, whole-grain carbohydrates was discussed in detail with the patient.   A list for source of those nutrients were also provided to the patient.  Patient will use only water or unsweetened tea for hydration. B.  The need to stay away from risky substances including alcohol, smoking; obtaining 7 to 9 hours of restorative sleep, at least 150 minutes of moderate intensity exercise weekly, the importance of healthy social connections,  and stress reduction techniques were discussed. C.  A full color page of  Calorie density of various  food groups per pound showing examples of each food groups was provided to the patient.  - Nutritional counseling repeated at each appointment due to patients tendency to fall back in to old habits.  - The patient admits there is a room for improvement in their diet and drink choices. -  Suggestion is made for the patient to avoid simple carbohydrates from their diet including Cakes, Sweet Desserts / Pastries, Ice Cream, Soda (diet and regular), Sweet Tea, Candies, Chips, Cookies, Sweet Pastries, Store Bought Juices, Alcohol in Excess of 1-2 drinks a day, Artificial Sweeteners, Coffee Creamer, and "Sugar-free" Products. This will help patient to have stable blood glucose profile and potentially avoid unintended weight gain.   - I encouraged the patient to switch to unprocessed or minimally processed complex starch and increased protein intake (animal or plant source), fruits, and vegetables.   - Patient is advised to stick to a routine mealtimes to eat 3 meals a day and avoid unnecessary snacks (to snack only to correct hypoglycemia).  - I have approached her with the following individualized plan to manage her diabetes and patient agrees:   -She is advised to continue her Lantus 50 units SQ nightly and Glipizide 5 mg XL daily with breakfast.  -she is encouraged to continue monitoring glucose 4 times daily (using her CGM), before meals and before bed, and to call the clinic if she has readings less than 70 or above 300 for 3 tests in a row.  - she is warned not to take insulin without proper monitoring per orders. - Adjustment parameters are given to her for hypo and hyperglycemia in writing.  She did not tolerate Jardiance in the past (yeast infections and worsening renal failure).  - Specific targets for  A1c; LDL, HDL, and Triglycerides were discussed with the patient.  2) Blood Pressure /Hypertension:  her blood pressure is controlled to target.   she is advised to continue her current  medications including Losartan 12.5 mg p.o. daily with breakfast, Metoprolol 25 mg po daily, Demadex 20 mg po daily and Spironolactone 12.5 mg po daily.  Kidney function has worsened slightly.  3) Lipids/Hyperlipidemia:    Review of her recent lipid panel from 04/17/23 showed controlled LDL at 73 and elevated triglycerides of 227 .  she is advised to continue Crestor 10 mg daily at bedtime.  Side effects and precautions discussed with her.    4)  Weight/Diet:  her Body mass index is 31.75 kg/m.  -  clearly complicating her diabetes care.   she is a candidate for weight loss. I discussed with her the fact that loss of 5 - 10% of her  current body weight will have the most impact on her diabetes management.  Exercise, and detailed carbohydrates information provided  -  detailed on discharge instructions.  5) Hypothyroidism The details of her diagnosis are not available to review.  Her previsit TFTs are consistent with appropriate hormone replacement. She is currently on Levothyroxine 150 mcg po daily before breakfast and is advised to continue this for now.  She is on Amiodarone which can throw off her thyroid function.    6) Chronic Care/Health Maintenance: -she is on ACEI/ARB and Statin medications and is encouraged to initiate and continue to follow up with Ophthalmology, Dentist, Podiatrist at least yearly or according to recommendations, and advised to stay away from smoking. I have recommended yearly flu vaccine and pneumonia vaccine at least every 5 years; moderate intensity exercise for up to 150 minutes weekly; and sleep for at least 7 hours a day.  - she is advised to maintain close follow up with Bosie Clos, MD for primary care needs, as well as her other providers for optimal and coordinated care.     I spent  38  minutes in the care of the patient today including review of labs from CMP, Lipids, Thyroid Function, Hematology (current and previous including abstractions from other  facilities); face-to-face time discussing  her blood glucose readings/logs, discussing hypoglycemia and hyperglycemia episodes and symptoms, medications doses, her options of short and long term treatment based on the latest standards of care / guidelines;  discussion about incorporating lifestyle medicine;  and documenting the encounter. Risk reduction counseling performed per USPSTF guidelines to reduce obesity and cardiovascular risk factors.     Please refer to Patient Instructions for Blood Glucose Monitoring and Insulin/Medications Dosing Guide"  in media tab for additional information. Please  also refer to " Patient Self Inventory" in the Media  tab for reviewed elements of pertinent patient history.  Michele [name] participated in the discussions, expressed understanding, and voiced agreement with the above plans.  All questions were answered to her satisfaction. she is encouraged to contact clinic should she have any questions or concerns prior to her return visit.     Follow up plan: - Return in about 4 months (around 08/25/2023) for Diabetes F/U with A1c in office, No previsit labs, Bring meter and logs.   Ronny Bacon, [facility] [address] [place], [place] [address] Phone: [phone] Fax: [phone]  04/24/2023, 11:53 AM

## 2023-05-06 ENCOUNTER — Other Ambulatory Visit: Payer: Self-pay | Admitting: Cardiovascular Disease

## 2023-05-06 DIAGNOSIS — I4891 Unspecified atrial fibrillation: Secondary | ICD-10-CM

## 2023-05-12 ENCOUNTER — Other Ambulatory Visit: Payer: Self-pay | Admitting: Cardiovascular Disease

## 2023-05-25 IMAGING — US US CHEST/MEDIASTINUM
1 series · 7 of 7 positions shown · non-contrast
Comparison: None.

CLINICAL DATA: Evaluate pleural fluid

EXAM:
CHEST ULTRASOUND

[Series 1: us chest/mediastinum · 0.26mm/px · 7 acquisitions, 7 frames shown]
[im 1/7]
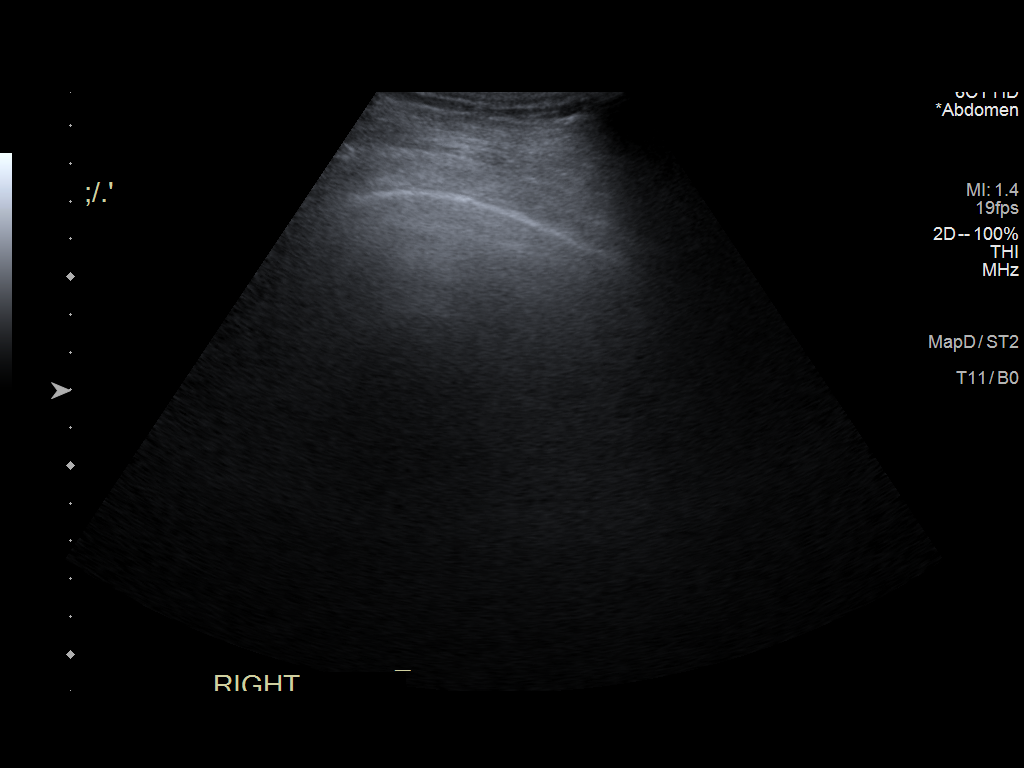
[im 2/7]
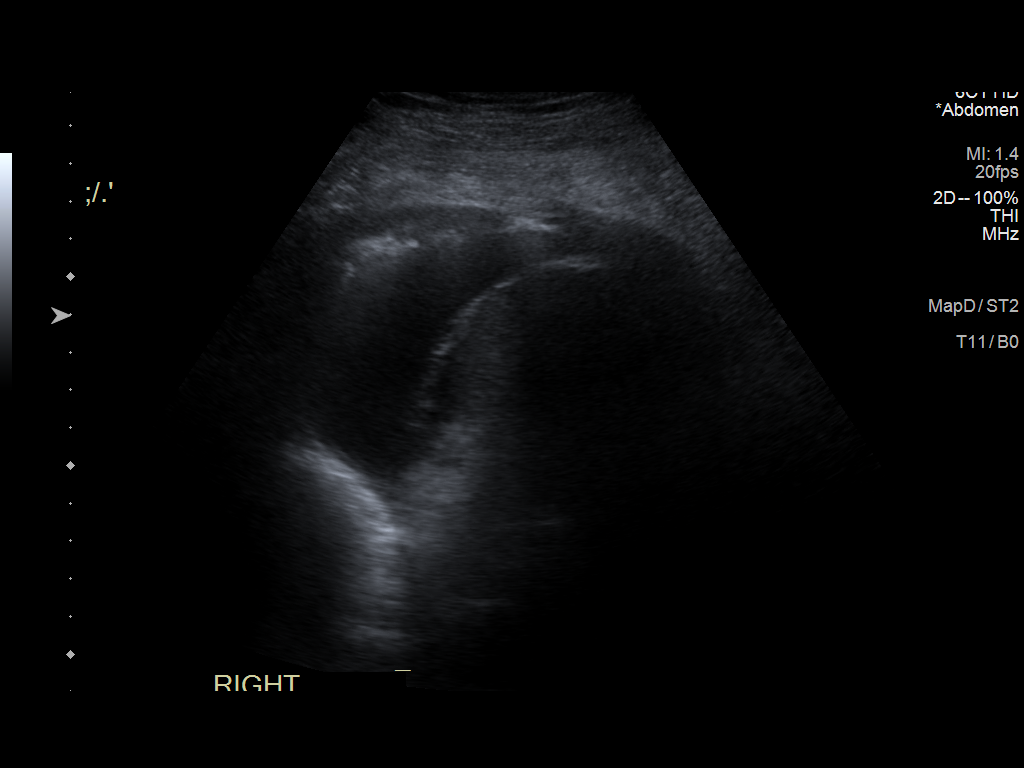
[im 3/7]
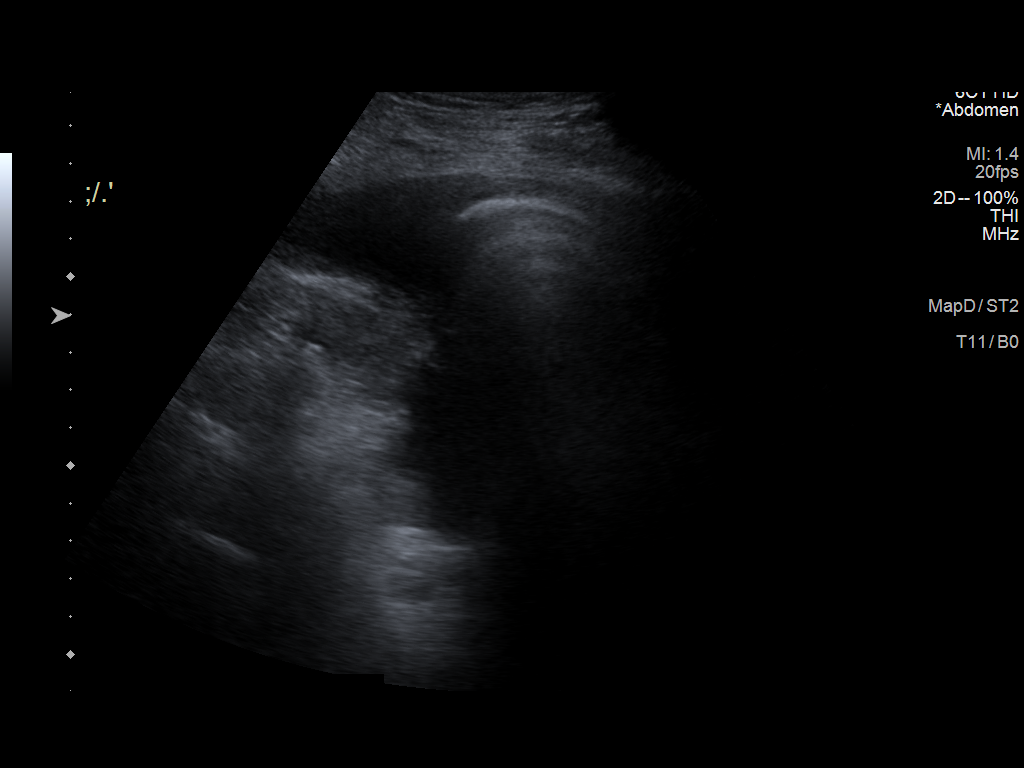
[im 4/7]
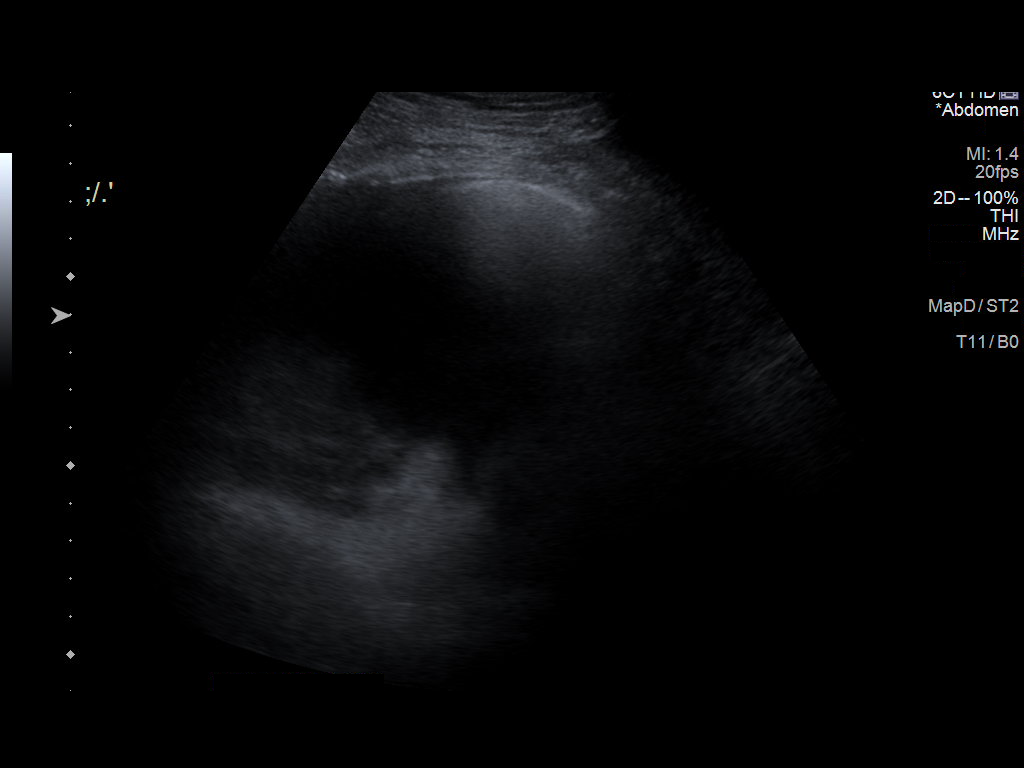
[im 5/7]
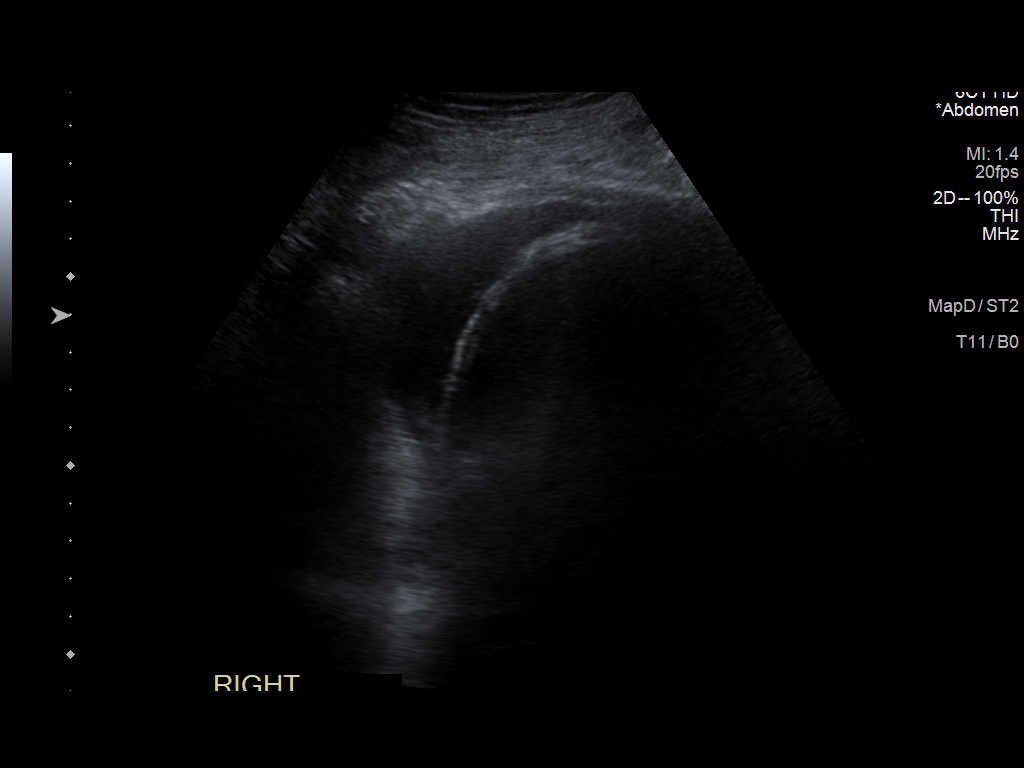
[im 6/7]
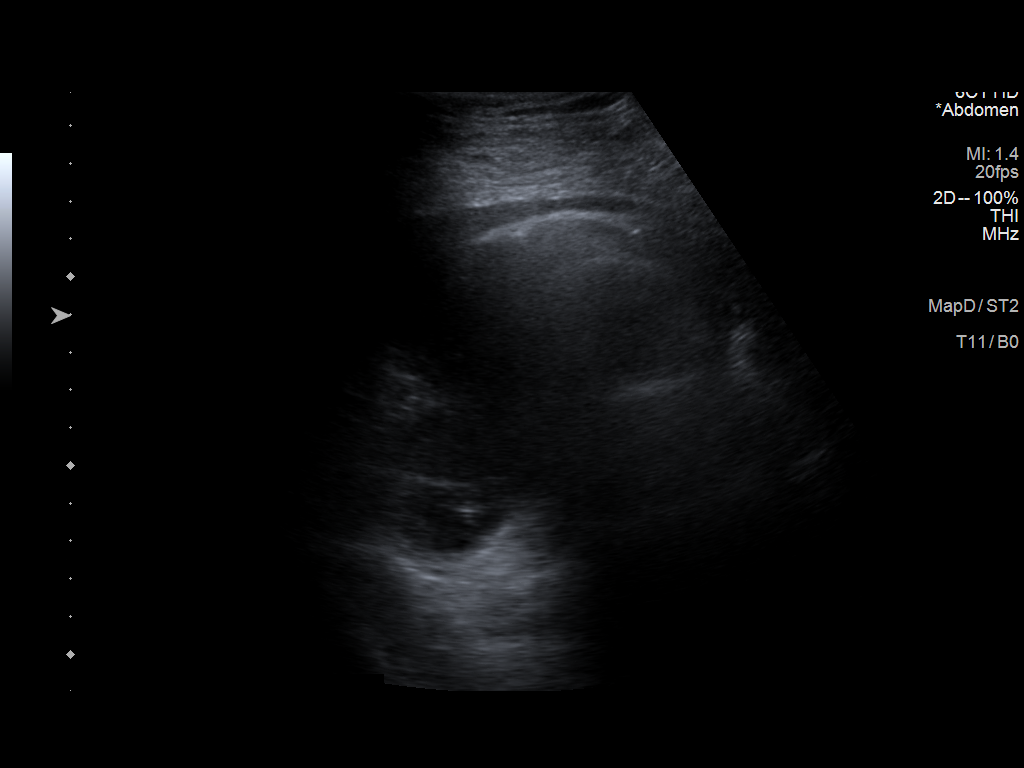
[im 7/7]
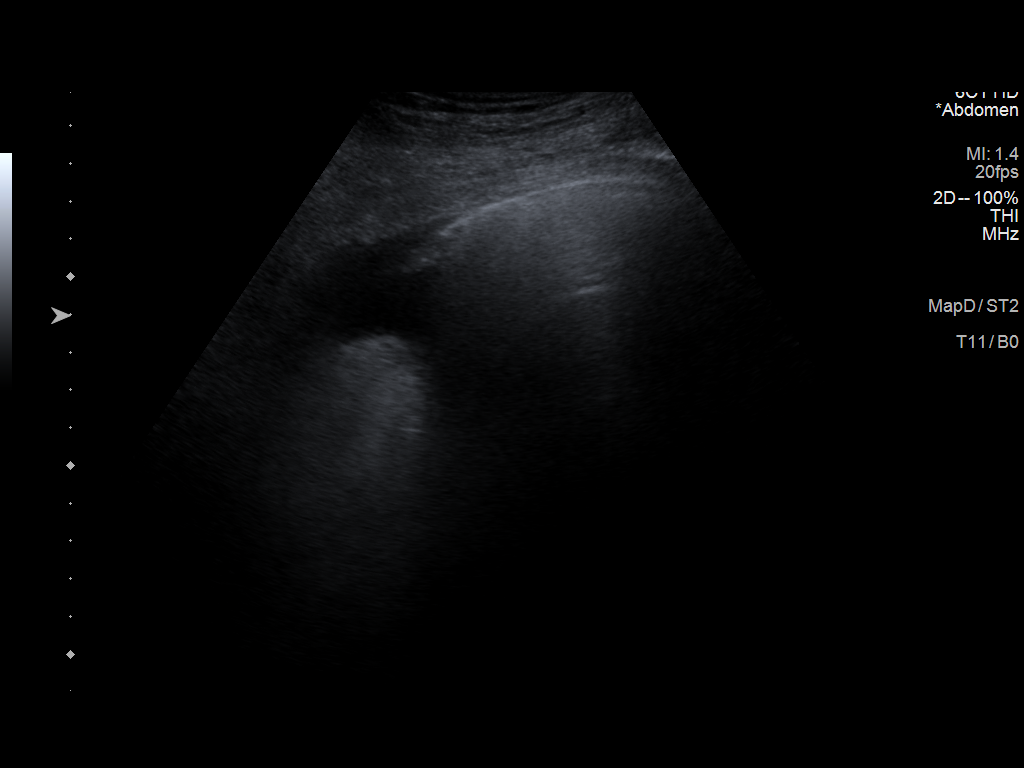

[7 of 7 positions shown; findings below may reference images not displayed]

FINDINGS: Limited sonographic exam of the bilateral chest was performed for
fluid assessment. Images demonstrate small left and trace right
pleural effusions. No safe percutaneous window is present on the
left side. Fluid volume is insufficient on the right for safe image
guided thoracentesis. No thoracentesis performed.
IMPRESSION: There is a small left and trace right pleural effusion. No safe
window/insufficient fluid volume for safe image guided
thoracentesis. No thoracentesis performed.

## 2023-05-26 IMAGING — DX DG CHEST 1V PORT
2 series · 2 of 2 positions shown · non-contrast
Comparison: Chest x-ray 07/15/2021, CT chest 07/13/2021

CLINICAL DATA: status post PICC line placement

EXAM:
PORTABLE CHEST 1 VIEW

[chest ap (1 of 2)]
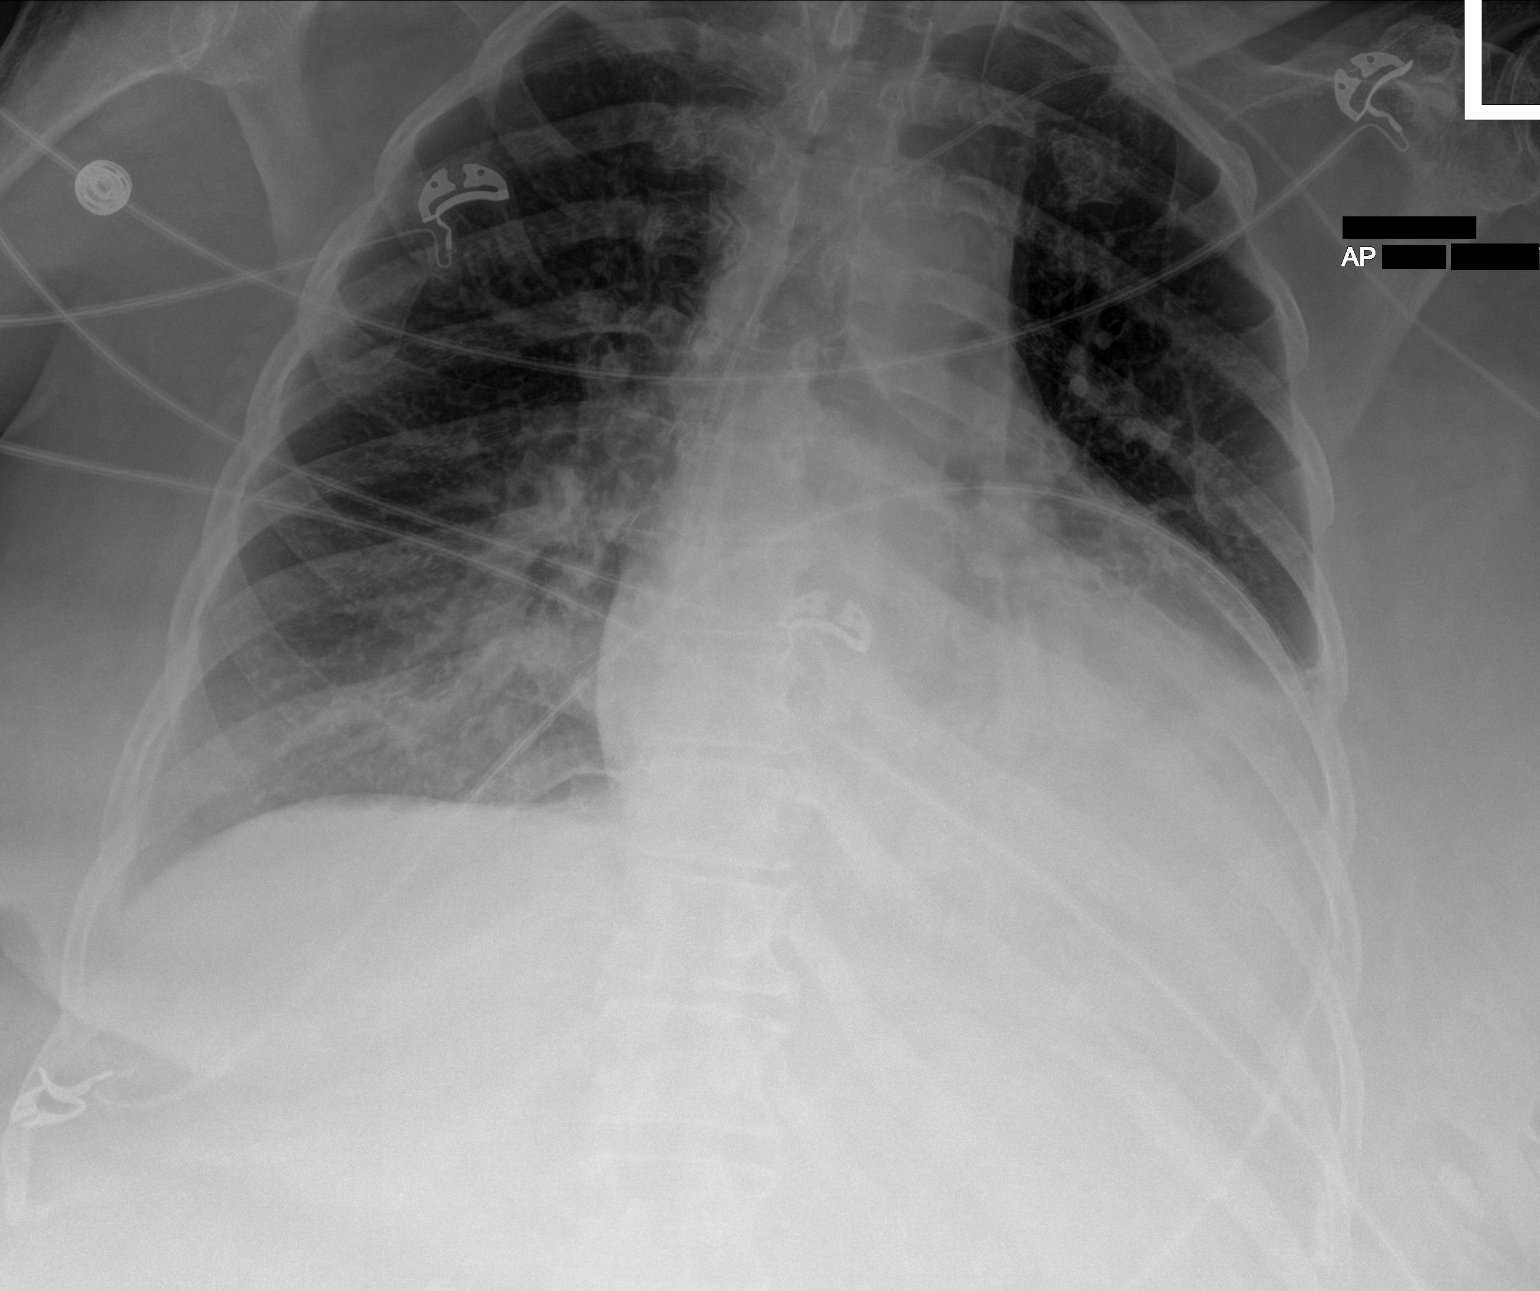

[chest ap (2 of 2)]
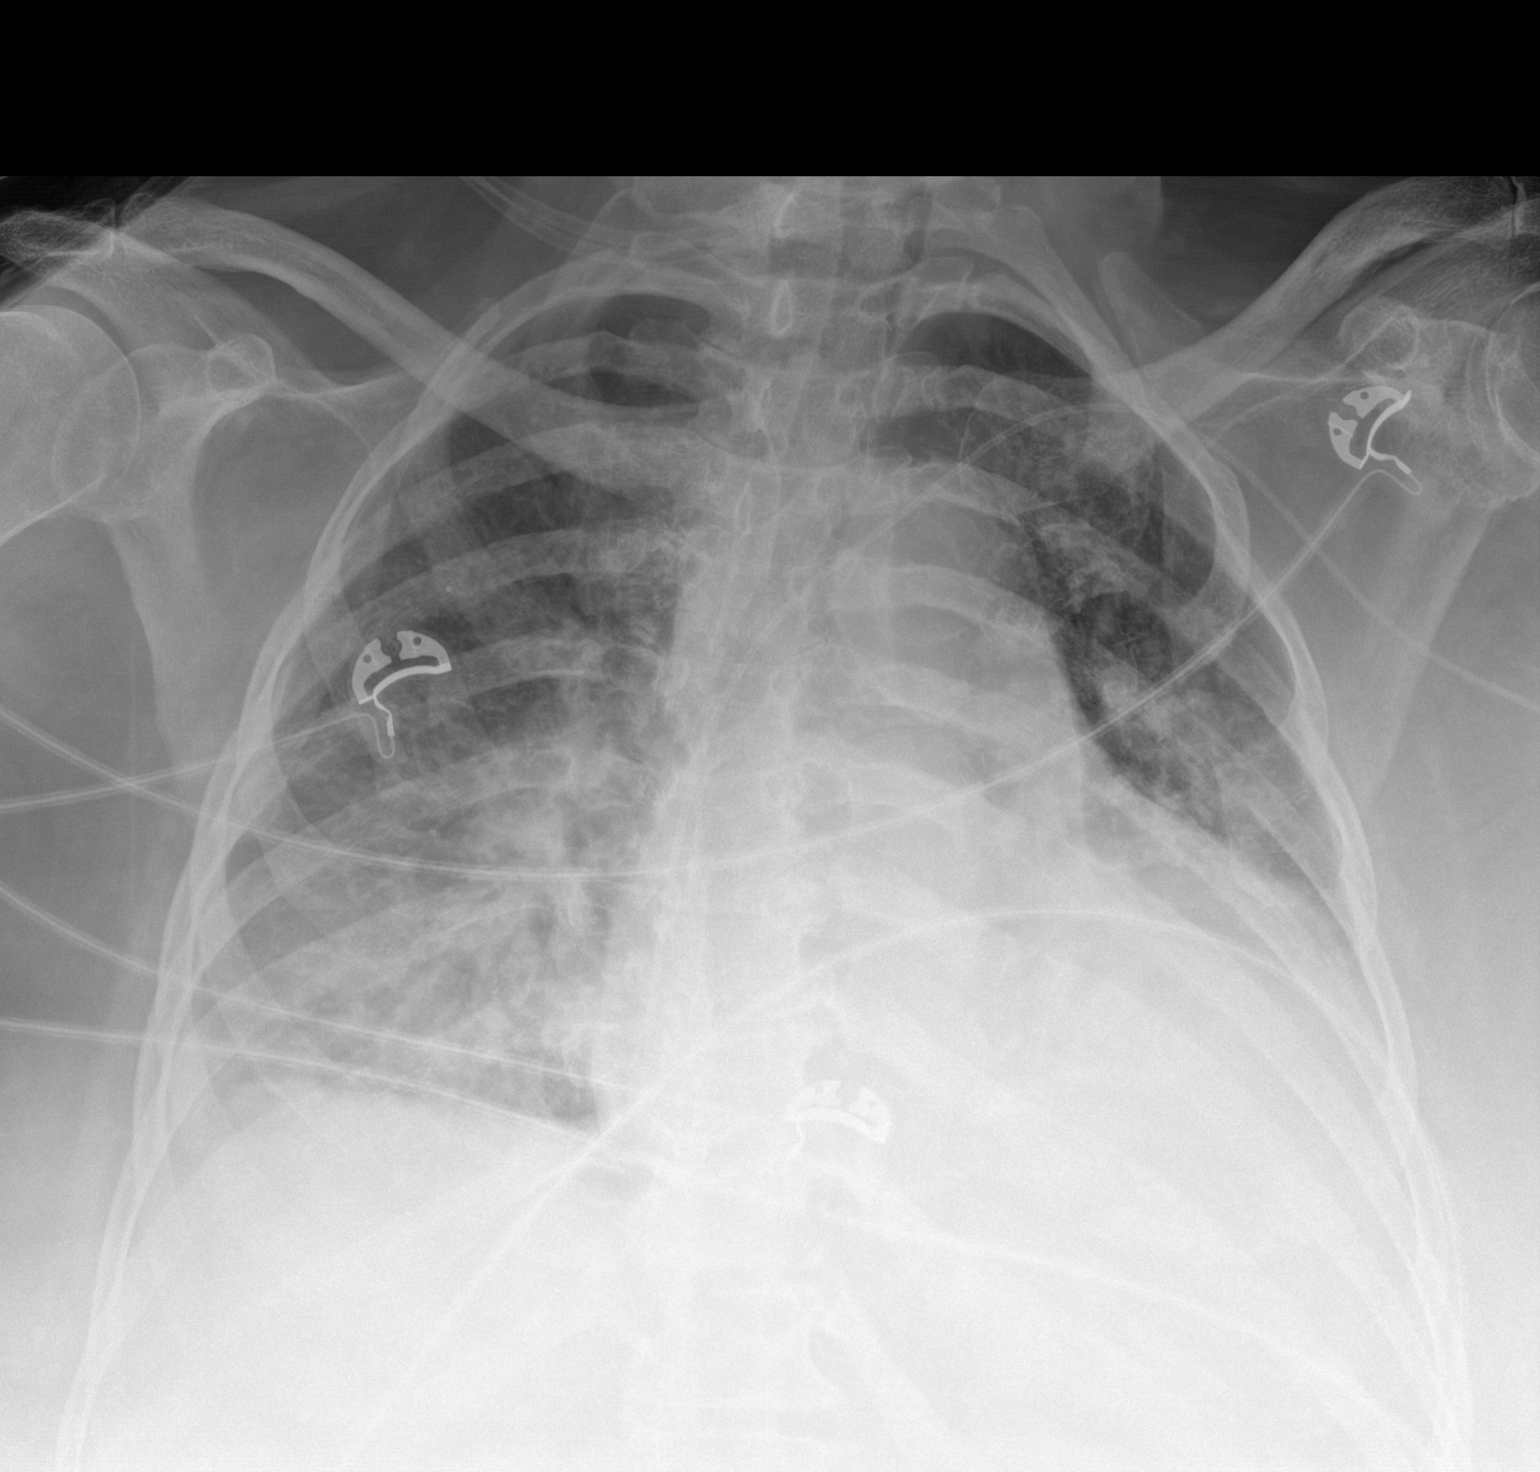

[2 of 2 positions shown; findings below may reference images not displayed]

FINDINGS: Left PICC with tip overlying the right atrium.

Enlarged cardiac silhouette. The heart and mediastinal contours are
unchanged. Prominent hilar vasculature.

No focal consolidation. Increased interstitial markings. Persistent
trace to small volume left and trace right pleural effusions. No
pneumothorax.

No acute osseous abnormality.
IMPRESSION: 1. Left PICC with tip overlying the right atrium.
2. Likely pulmonary edema with persistent trace to small volume left
and trace right pleural effusions. Superimposed
infection/inflammation not excluded. Followup PA and lateral chest
X-ray is recommended in 3-4 weeks following therapy to ensure
resolution and exclude underlying malignancy.

## 2023-05-26 IMAGING — DX DG CHEST 1V PORT
1 series · 1 of 1 positions shown · non-contrast
Comparison: July 13, 2021.

CLINICAL DATA: Dyspnea.

EXAM:
PORTABLE CHEST 1 VIEW

[chest ap]
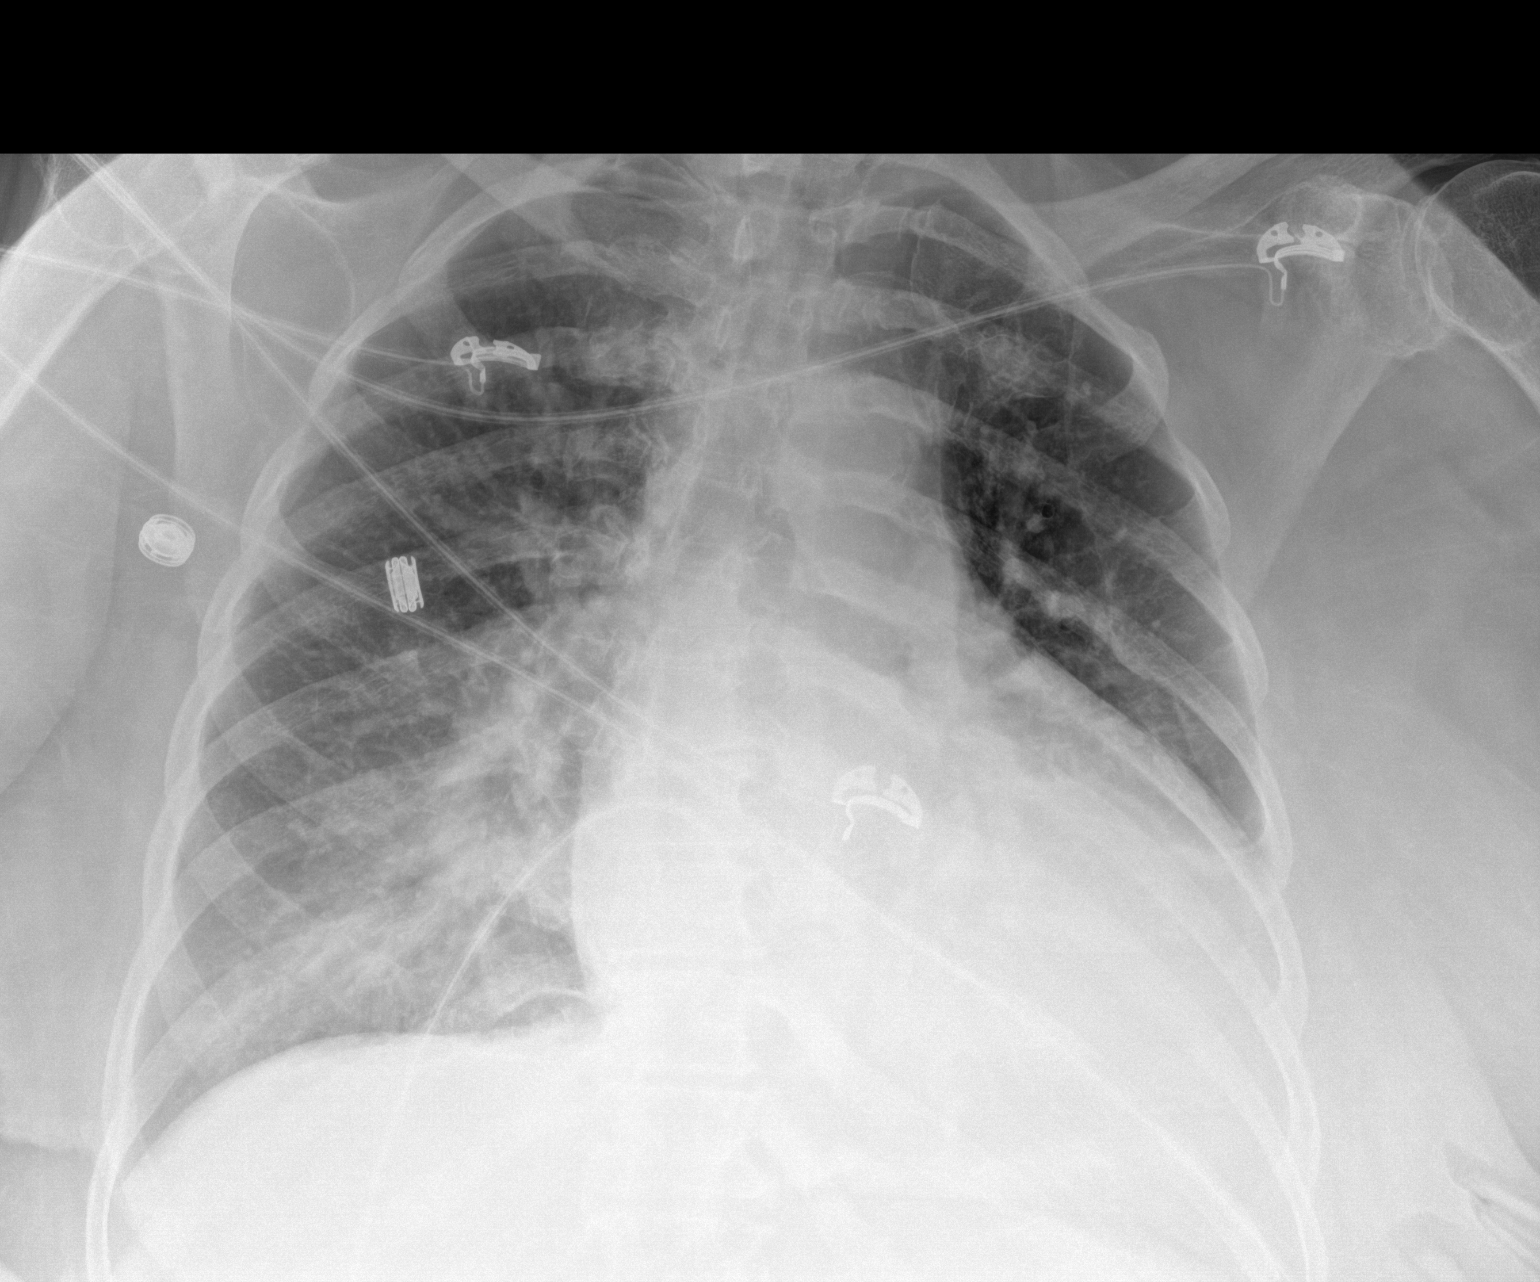

[1 of 1 positions shown; findings below may reference images not displayed]

FINDINGS: Stable cardiomegaly. Increased right basilar opacity is noted
concerning for worsening edema or pneumonia. Stable left basilar
opacity is noted as well. Bony thorax is unremarkable.
IMPRESSION: Stable left basilar opacity is noted. Increased right basilar
opacity is noted concerning for worsening edema or pneumonia.

## 2023-06-11 LAB — HM DIABETES EYE EXAM

## 2023-06-17 ENCOUNTER — Other Ambulatory Visit: Payer: Self-pay | Admitting: Internal Medicine

## 2023-06-18 NOTE — Telephone Encounter (Signed)
last visit: 12/26/22 with plan to Follow-up in 6 months. Next visit: 06/29/23

## 2023-06-29 ENCOUNTER — Ambulatory Visit: Payer: Medicare Other | Attending: Cardiovascular Disease | Admitting: Cardiovascular Disease

## 2023-06-29 ENCOUNTER — Encounter: Payer: Self-pay | Admitting: Cardiovascular Disease

## 2023-06-29 VITALS — BP 120/70 | HR 62 | Ht 62.0 in | Wt 172.5 lb

## 2023-06-29 DIAGNOSIS — I25118 Atherosclerotic heart disease of native coronary artery with other forms of angina pectoris: Secondary | ICD-10-CM | POA: Insufficient documentation

## 2023-06-29 DIAGNOSIS — N1831 Chronic kidney disease, stage 3a: Secondary | ICD-10-CM | POA: Insufficient documentation

## 2023-06-29 DIAGNOSIS — I428 Other cardiomyopathies: Secondary | ICD-10-CM | POA: Insufficient documentation

## 2023-06-29 DIAGNOSIS — I420 Dilated cardiomyopathy: Secondary | ICD-10-CM | POA: Insufficient documentation

## 2023-06-29 DIAGNOSIS — I480 Paroxysmal atrial fibrillation: Secondary | ICD-10-CM | POA: Diagnosis present

## 2023-06-29 DIAGNOSIS — E782 Mixed hyperlipidemia: Secondary | ICD-10-CM | POA: Diagnosis present

## 2023-06-29 DIAGNOSIS — Z794 Long term (current) use of insulin: Secondary | ICD-10-CM | POA: Insufficient documentation

## 2023-06-29 DIAGNOSIS — I10 Essential (primary) hypertension: Secondary | ICD-10-CM | POA: Diagnosis present

## 2023-06-29 DIAGNOSIS — E1122 Type 2 diabetes mellitus with diabetic chronic kidney disease: Secondary | ICD-10-CM | POA: Insufficient documentation

## 2023-06-29 DIAGNOSIS — I5022 Chronic systolic (congestive) heart failure: Secondary | ICD-10-CM | POA: Diagnosis present

## 2023-06-29 NOTE — Patient Instructions (Signed)
Medication Instructions:  Hold torsemide twice a week  If you need a refill on your cardiac medications before your next appointment, please call your pharmacy.   Lab work: No new labs needed  Testing/Procedures: No new testing needed  Follow-Up: At [facility], you and your health needs are our priority.  As part of our continuing mission to provide you with exceptional heart care, we have created designated Provider Care Teams.  These Care Teams include your primary Cardiologist (physician) and Advanced Practice Providers (APPs -  Physician Assistants and Nurse Practitioners) who all work together to provide you with the care you need, when you need it.  You will need a follow up appointment in 12 months  Providers on your designated Care Team:   Nicolasa Ducking, NP Eula Listen, PA-C Cadence Fransico Michael, [place]  COVID-19 Vaccine Information can be found at: [email or web address] For questions related to vaccine distribution or appointments, please email [email or web address] or call [phone].

## 2023-06-29 NOTE — Progress Notes (Signed)
Cardiology Office Note  Date:  06/29/2023   ID:  Michele [name], DOB 1956-01-14, MRN [id number]  PCP:  Patient, No Pcp Per   Chief Complaint  Patient presents with   6 month follow up     "Doing well." Medications reviewed by the patient verbally.     HPI:  [name] is a  67 year old woman with past medical history of morbid obesity,  coronary artery disease with 50% proximal LAD disease , followed by a 30% lesion by catheterization July 2009,  diabetes, hemoglobin A1c, poorly controlled hyperlipidemia,  ejection fraction 30% by echocardiogram in June 2009,  moderately dilated left atrium and left ventricle,  obstructive sleep apnea and wears CPAP. NICM Echo 7/23: Ef 50 to 55% Chronic renal insufficiency stage III a Who presents for f/u of her  coronary artery disease, hyperlipidemia , Afib  Last seen in clinic by myself June 2024 In follow-up she reports feeling relatively well No regular exercise program, limited by chronic knee pain Has a small dog, does not go walking with the dog as she is afraid of leg giving out  Denies significant tachycardia or palpitations No arrhythmia concerning for atrial fibrillation  [place] Weight stable  Reports compliance with her medications Followed by endocrine  A1c 7 down from more than 14 several years ago  Continues on torsemide 20 daily Creatinine 1.7 Previously on Jardiance, this was changed to glipizide  EKG personally reviewed by myself on todays visit EKG Interpretation Date/Time:  Friday June 29 2023 10:26:44 EST Ventricular Rate:  62 PR Interval:  222 QRS Duration:  136 QT Interval:  476 QTC Calculation: 483 R Axis:   60  Text Interpretation: Sinus rhythm with 1st degree A-V block Non-specific intra-ventricular conduction block Minimal voltage criteria for LVH, may be normal variant ( Cornell product ) Abnormal QRS-T angle, consider primary T wave abnormality When compared  with ECG of 22-Jul-2021 22:09, QRS duration has increased Criteria for Lateral infarct are no longer Present ST less elevated in Inferior leads T wave inversion now evident in Inferior leads Confirmed by Julien Nordmann [phone]) on 06/29/2023 10:38:15 AM   Other past medical history reviewed  07/2021, progressive weakness and dyspnea w/ finding of AFib w/ RVR.  Hospital admission to [facility] due to rapid afib and acute hypoxic resp failure.  CHF was noted on CTA chest.   PE could not be excluded.   hypotensive and required norepinephrine.   Echocardiogram on January 5 showed EF of less than 20% with global hypokinesis.  The inferior and inferolateral walls were best preserved.  The right ventricular function was moderately reduced with small pericardial effusion, and mild to moderate mitral regurgitation.    right and left heart cardiac catheterization on July 15 2021 showing moderately elevated filling pressures and severely reduced cardiac output (2.19) and index (1.13).   70% stenosis in the mid LAD, which was not felt to be flow-limiting and continued IV diuresis with initiation of milrinone was recommended, as well as TEE cardioversion.  Dyspnea improved with diuresis and she was eventually able to be weaned off of norepinephrine.    TEE and cardioversion was successfully performed on January 10, with restoration of sinus rhythm.   Amiodarone therapy was initiated.   Milrinone was discontinued on January 11, and beta-blocker and ARB therapy were added.   transitioned to oral torsemide , discharged home on January 15 at 193 pounds.  In heart failure clinic follow-up on January 17,  torsemide dose was reduced to 40 mg daily   PMH:   has a past medical history of Abscess of breast, right (08/19/2012), Breast cancer (HCC) (07/13/2010), Coronary artery disease, DCIS (ductal carcinoma in situ) of breast, right (2012), Diabetes mellitus (HCC), GERD (gastroesophageal reflux disease), HFrEF (heart failure  with reduced ejection fraction) (HCC), Hyperlipidemia, Hypertension, Ischemic cardiomyopathy, Obesity, unspecified, Persistent atrial fibrillation (HCC), Personal history of radiation therapy, Sleep apnea, Thyroid disease, and Umbilical hernia without mention of obstruction or gangrene.  PSH:    Past Surgical History:  Procedure Laterality Date   BREAST BIOPSY Right 2007   right bx neg   BREAST BIOPSY Right 08/10/2016   fat necrosis/ done in Dr. Rutherford Nail office   BREAST EXCISIONAL BIOPSY Right 2011   DCIS mammosite lumpectomy   BREAST LUMPECTOMY Right Jan 2012   Wide excision   BREAST MASS EXCISION Right 2011   December   CARDIAC CATHETERIZATION  01/08/2008   CHOLECYSTECTOMY  2013   [facility]   HERNIA REPAIR  2013   epigastric   INCISION AND DRAINAGE BREAST ABSCESS Right 08/19/2012   RIGHT/LEFT HEART CATH AND CORONARY ANGIOGRAPHY N/A 07/15/2021   Procedure: RIGHT/LEFT HEART CATH AND CORONARY ANGIOGRAPHY;  Surgeon: Iran Ouch, MD;  Location: [facility] INVASIVE CV LAB;  Service: Cardiovascular;  Laterality: N/A;   TEE WITHOUT CARDIOVERSION N/A 07/19/2021   Procedure: TRANSESOPHAGEAL ECHOCARDIOGRAM (TEE);  Surgeon: Antonieta Iba, MD;  Location: [facility] ORS;  Service: Cardiovascular;  Laterality: N/A;   TONSILLECTOMY AND ADENOIDECTOMY     age 2 yrs    Current Outpatient Medications  Medication Sig Dispense Refill   amiodarone (PACERONE) 200 MG tablet TAKE 1 TABLET BY MOUTH EVERY DAY 90 tablet 0   Blood Glucose Monitoring Suppl (ONE TOUCH ULTRA SYSTEM KIT) w/Device KIT Check sugar once daily. DX E11.9 1 each 0   digoxin (LANOXIN) 0.125 MG tablet Take 0.0625 mg by mouth daily.     ELIQUIS 5 MG TABS tablet TAKE 1 TABLET BY MOUTH TWICE A DAY 60 tablet 5   fluticasone (FLONASE) 50 MCG/ACT nasal spray Place 2 sprays into the nose daily.     glipiZIDE (GLUCOTROL XL) 5 MG 24 hr tablet TAKE 1 TABLET BY MOUTH EVERY DAY WITH BREAKFAST 90 tablet 1   glucose blood (ONETOUCH  ULTRA) test strip Use to check blood glucose four times daily as directed 100 strip 3   insulin glargine (LANTUS SOLOSTAR) 100 UNIT/ML Solostar Pen Inject 50 Units into the skin at bedtime. 45 mL 3   Insulin Pen Needle 32G X 4 MM MISC Inject twice daily, SQ. DX E11.9 200 each 3   Lancets (ONETOUCH ULTRASOFT) lancets Check sugar once daily DX E11.9 100 each 3   levothyroxine (SYNTHROID) 150 MCG tablet Take 1 tablet (150 mcg total) by mouth every morning. 90 tablet 3   losartan (COZAAR) 25 MG tablet TAKE 1/2 TABLET BY MOUTH DAILY 45 tablet 0   metoprolol succinate (TOPROL-XL) 25 MG 24 hr tablet TAKE 1 TABLET (25 MG TOTAL) BY MOUTH DAILY. 90 tablet 1   NON FORMULARY CPAP AT BEDTIME     rosuvastatin (CRESTOR) 10 MG tablet Take 1 tablet (10 mg total) by mouth daily. 90 tablet 3   spironolactone (ALDACTONE) 25 MG tablet Take 1 tablet (25 mg total) by mouth daily. 90 tablet 3   torsemide (DEMADEX) 20 MG tablet TAKE 1 TABLET BY MOUTH EVERY DAY 90 tablet 0   No current facility-administered medications for  this visit.    Allergies:   Entresto [sacubitril-valsartan]   Social History:  The patient  reports that she has never smoked. She has never used smokeless tobacco. She reports that she does not drink alcohol and does not use drugs.   Family History:   family history includes Arthritis in her father; CVA in her mother; Congestive Heart Failure in her mother; Diabetes in her mother and sister; Heart attack in her father, maternal grandfather, and paternal grandmother; Heart failure in her mother; Hemochromatosis in her brother; Hypertension in her brother, brother, and sister; Lung cancer in her sister; Stroke in her maternal grandmother.    Review of Systems: Review of Systems  Constitutional: Negative.   Respiratory: Negative.    Cardiovascular: Negative.   Gastrointestinal: Negative.   Musculoskeletal: Negative.   Neurological: Negative.   Psychiatric/Behavioral: Negative.    All other  systems reviewed and are negative.   PHYSICAL EXAM: VS:  BP 120/70 (BP Location: Left Arm, Patient Position: Sitting, Cuff Size: Normal)   Pulse 62   Ht 5\' 2"  (1.575 m)   Wt 172 lb 8 oz (78.2 kg)   SpO2 95%   BMI 31.55 kg/m  , BMI Body mass index is 31.55 kg/m. Constitutional:  oriented to person, place, and time. No distress.  HENT:  Head: Grossly normal Eyes:  no discharge. No scleral icterus.  Neck: No JVD, no carotid bruits  Cardiovascular: Regular rate and rhythm, no murmurs appreciated Pulmonary/Chest: Clear to auscultation bilaterally, no wheezes or rails Abdominal: Soft.  no distension.  no tenderness.  Musculoskeletal: Normal range of motion Neurological:  normal muscle tone. Coordination normal. No atrophy Skin: Skin warm and dry Psychiatric: normal affect, pleasant  Recent Labs: 04/17/2023: ALT 19; BUN 55; Creatinine, Ser 1.75; Potassium 4.4; Sodium 139; TSH 1.630    Lipid Panel Lab Results  Component Value Date   CHOL 139 04/17/2023   HDL 28 (L) 04/17/2023   LDLCALC 73 04/17/2023   TRIG 227 (H) 04/17/2023    Wt Readings from Last 3 Encounters:  06/29/23 172 lb 8 oz (78.2 kg)  04/24/23 173 lb 9.6 oz (78.7 kg)  12/26/22 169 lb 2 oz (76.7 kg)     ASSESSMENT AND PLAN:  Atherosclerosis of native coronary artery of native heart with stable angina pectoris (HCC) -  Currently with no symptoms of angina. No further workup at this time. Continue current medication regimen.  Type 2 diabetes mellitus with other circulatory complication, with long-term current use of insulin (HCC) Dramatic improvement in A1c Working with endocrinology, A1c 7.1 in June 2024 Reports sugars are well-controlled after recent changed off Jardiance onto glipizide  Atrial fibrillation with RVR Maintaining normal sinus rhythm  continue succinate 25 daily, Eliquis, digoxin, amiodarone 200 daily Denies symptoms  Other hyperlipidemia Cholesterol well-controlled on Crestor 10  daily  Cardiomyopathy, nonischemic Ejection fraction improved 50-55% in normal sinus rhythm Continue spironolactone 25 daily, metoprolol succinate 25 daily, digoxin 0.12 5/2 pill daily losartan 12.5 daily  PVC (premature ventricular contraction) - Plan: EKG 12-Lead Asymptomatic,   Morbid obesity Size limited secondary to chronic knee pain Recommended continued restriction of carbohydrates   Orders Placed This Encounter  Procedures   EKG 12-Lead    Signed, Dossie Arbour, M.D., Ph.D. 06/29/2023  [facility] [place], [place] [phone]

## 2023-07-16 ENCOUNTER — Ambulatory Visit: Payer: Medicare Other | Admitting: Podiatry

## 2023-07-19 ENCOUNTER — Encounter: Payer: Self-pay | Admitting: Podiatry

## 2023-07-19 ENCOUNTER — Ambulatory Visit (INDEPENDENT_AMBULATORY_CARE_PROVIDER_SITE_OTHER): Payer: Medicare Other | Admitting: Podiatry

## 2023-07-19 VITALS — Ht 62.0 in | Wt 172.5 lb

## 2023-07-19 DIAGNOSIS — B351 Tinea unguium: Secondary | ICD-10-CM

## 2023-07-19 DIAGNOSIS — M79674 Pain in right toe(s): Secondary | ICD-10-CM

## 2023-07-19 DIAGNOSIS — M79675 Pain in left toe(s): Secondary | ICD-10-CM

## 2023-07-19 DIAGNOSIS — Z794 Long term (current) use of insulin: Secondary | ICD-10-CM

## 2023-07-19 DIAGNOSIS — E1142 Type 2 diabetes mellitus with diabetic polyneuropathy: Secondary | ICD-10-CM

## 2023-07-27 NOTE — Progress Notes (Signed)
  Subjective:  Patient ID: Michele [name], female    DOB: 1956-06-21,  MRN: [id number]  68 y.o. female presents at risk foot care with history of diabetic neuropathy and painful thick toenails that are difficult to trim. Pain interferes with ambulation. Aggravating factors include wearing enclosed shoe gear. Pain is relieved with periodic professional debridement. Chief Complaint  Patient presents with   Nail Problem    Pt is here for [facility] last A1C was 7 Pt does not have a PCP    New problem(s): None   She sees Dani Gobble in Endocrinology for her diabetes.  Allergies  Allergen Reactions   Entresto [Sacubitril-Valsartan] Other (See Comments)    hypotension   Review of Systems: Negative except as noted in the HPI.   Objective:  Michele [name] is a pleasant 68 y.o. female in NAD. AAO x 3.  Vascular Examination: Faintly palpable pedal pulses. CFT immediate b/l. Pedal hair present. No edema. No pain with calf compression b/l. Skin temperature gradient WNL b/l. No varicosities noted. No cyanosis or clubbing noted.  Neurological Examination: Protective sensation intact with 10 gram monofilament right lower extremity. Protective sensation diminished with 10 gram monofilament left lower extremity.  Dermatological Examination: Pedal skin with normal turgor, texture and tone b/l. No open wounds nor interdigital macerations noted. Toenails 1-5 b/l thick, discolored, elongated with subungual debris and pain on dorsal palpation. No hyperkeratotic lesions noted b/l.   Musculoskeletal Examination: Muscle strength 5/5 to b/l LE.  HAV with bunion deformity noted b/l LE.  Radiographs: None  Last A1c:      Latest Ref Rng & Units 04/24/2023   11:39 AM 12/19/2022   11:37 AM  Hemoglobin A1C  Hemoglobin-A1c 4.0 - 5.6 % 7.0  7.1      Assessment:   1. Pain due to onychomycosis of toenails of both feet   2. Type 2 diabetes mellitus with diabetic polyneuropathy, with long-term current use of  insulin (HCC)    Plan:  Patient was evaluated and treated. All patient's and/or POA's questions/concerns addressed on today's visit. Toenails 1-5 debrided in length and girth without incident. Continue soft, supportive shoe gear daily. Report any pedal injuries to medical professional. Call office if there are any questions/concerns. -Continue foot and shoe inspections daily. Monitor blood glucose per PCP/Endocrinologist's recommendations. -Patient/POA to call should there be question/concern in the interim.  Return in about 3 months (around 10/17/2023).  Michele [name], DPM      [place] LOCATION: [address], [place] [address]                   Office [phone]   [facility] LOCATION: [address] [place], [place] [address] Office [phone]

## 2023-08-02 ENCOUNTER — Other Ambulatory Visit: Payer: Self-pay | Admitting: Cardiovascular Disease

## 2023-08-03 ENCOUNTER — Other Ambulatory Visit: Payer: Self-pay | Admitting: Cardiovascular Disease

## 2023-08-03 DIAGNOSIS — I4891 Unspecified atrial fibrillation: Secondary | ICD-10-CM

## 2023-08-15 ENCOUNTER — Other Ambulatory Visit: Payer: Self-pay | Admitting: Cardiovascular Disease

## 2023-08-27 ENCOUNTER — Encounter: Payer: Self-pay | Admitting: Nurse Practitioner

## 2023-08-27 ENCOUNTER — Ambulatory Visit (INDEPENDENT_AMBULATORY_CARE_PROVIDER_SITE_OTHER): Payer: Medicare Other | Admitting: Nurse Practitioner

## 2023-08-27 VITALS — BP 120/80 | HR 58 | Ht 62.0 in | Wt 175.4 lb

## 2023-08-27 DIAGNOSIS — Z794 Long term (current) use of insulin: Secondary | ICD-10-CM | POA: Diagnosis not present

## 2023-08-27 DIAGNOSIS — E119 Type 2 diabetes mellitus without complications: Secondary | ICD-10-CM | POA: Diagnosis not present

## 2023-08-27 DIAGNOSIS — Z7984 Long term (current) use of oral hypoglycemic drugs: Secondary | ICD-10-CM

## 2023-08-27 DIAGNOSIS — E039 Hypothyroidism, unspecified: Secondary | ICD-10-CM | POA: Diagnosis not present

## 2023-08-27 LAB — POCT GLYCOSYLATED HEMOGLOBIN (HGB A1C): Hemoglobin A1C: 6.6 % — AB (ref 4.0–5.6)

## 2023-08-27 MED ORDER — LANTUS SOLOSTAR 100 UNIT/ML ~~LOC~~ SOPN
50.0000 [IU] | PEN_INJECTOR | Freq: Every day | SUBCUTANEOUS | 3 refills | Status: AC
Start: 2023-08-27 — End: ?

## 2023-08-27 MED ORDER — GLIPIZIDE ER 5 MG PO TB24: 5.0000 mg | ORAL_TABLET | Freq: Every day | ORAL | 1 refills | Status: DC

## 2023-08-27 NOTE — Progress Notes (Signed)
Endocrinology Follow Up Note       08/27/2023, 2:50 PM   Subjective:    Patient ID: Michele [name], female    DOB: 02-01-1956.  Michele [name] is being seen in follow up after being seen in consultation for management of currently uncontrolled symptomatic diabetes requested by  Patient, No Pcp Per.   Past Medical History:  Diagnosis Date   Abscess of breast, right 08/19/2012   Breast cancer (HCC) 07/13/2010   1.5 cm,intermediate grade DCIS, nuclear grade 2, ER 90%, PR 90% treated with wide excision, reexcision to negative margins and MammoSite partial breast radiation.   Coronary artery disease    a. 2009 Cath: nonobs LAD dzs; b. 07/2021 Cath: LM nl, LAD 52m, D1 mild dzs, D2 40, LCX nl, RCA 40p. PCWP 24. PA 37/26(30).   DCIS (ductal carcinoma in situ) of breast, right 2012   Diabetes mellitus (HCC)    Type II   GERD (gastroesophageal reflux disease)    HFrEF (heart failure with reduced ejection fraction) (HCC)    a. 2009 Echo: LV dysfxn; b. 2013 Echo: EF 50-55%; c. 07/2021 Echo: EF<20%, glob HK - in/inflat moves best. Nl PASP. Sev dil LA. Mildly dil RA. Mild-mod MR; d. 01/2022 Echo: EF 50-55%, no rwma, mod LVH, GrI DD, nl RV fxn.   Hyperlipidemia    Hypertension    Ischemic cardiomyopathy    a. 2009 Echo: LV dysfxn; b. 2013 Echo: EF 50-55%; c. 07/2021 Echo: EF<20% d. 01/2022 Echo: EF 50-55%, no rwma, mod LVH, GrI DD, nl RV fxn.   Obesity, unspecified    Persistent atrial fibrillation (HCC)    a. 07/2021 s/p TEE and DCCV (150J)-->amio/eliquis (CHA2DS2VASc 6).   Personal history of radiation therapy    Sleep apnea    Thyroid disease    hypothyroidism   Umbilical hernia without mention of obstruction or gangrene     Past Surgical History:  Procedure Laterality Date   BREAST BIOPSY Right 2007   right bx neg   BREAST BIOPSY Right 08/10/2016   fat necrosis/ done in Dr. Rutherford [facility] office   BREAST EXCISIONAL BIOPSY  Right 2011   DCIS mammosite lumpectomy   BREAST LUMPECTOMY Right Jan 2012   Wide excision   BREAST MASS EXCISION Right 2011   December   CARDIAC CATHETERIZATION  01/08/2008   CHOLECYSTECTOMY  2013   [facility]   HERNIA REPAIR  2013   epigastric   INCISION AND DRAINAGE BREAST ABSCESS Right 08/19/2012   RIGHT/LEFT HEART CATH AND CORONARY ANGIOGRAPHY N/A 07/15/2021   Procedure: RIGHT/LEFT HEART CATH AND CORONARY ANGIOGRAPHY;  Surgeon: Iran Ouch, MD;  Location: [facility] INV[facility]IVE [facility];  Service: Cardiovascular;  Laterality: N/A;   TEE WITHOUT CARDIOVERSION N/A 07/19/2021   Procedure: TRANSESOPHAGEAL ECHOCARDIOGRAM (TEE);  Surgeon: Antonieta Iba, MD;  Location: [facility] ORS;  Service: Cardiovascular;  Laterality: N/A;   TONSILLECTOMY AND ADENOIDECTOMY     age 68 yrs    Social History   Socioeconomic History   Marital status: Widowed    Spouse name: Michele [name]   Number of children: 0   Years of education: Not on file   Highest education level:  Not on file  Occupational History   Occupation: retired    Associate Professor: [employer]  Tobacco Use   Smoking status: Never   Smokeless tobacco: Never  Vaping Use   Vaping status: Never Used  Substance and Sexual Activity   Alcohol use: No   Drug use: No   Sexual activity: Not on file  Other Topics Concern   Not on file  Social History Narrative   Never smoked; no alcohol; lives in [place]; self;    husband passed in 2020.    Retired from [employer].    Good relationship with in-laws - they help when she needs it   Social Drivers of Corporate investment banker Strain: Low Risk  (10/24/2021)   Overall Financial Resource Strain (CARDIA)    Difficulty of Paying Living Expenses: Not hard at all  Food Insecurity: No Food Insecurity (10/24/2021)   Hunger Vital Sign    Worried About Running Out of Food in the Last Year: Never true    Ran Out of Food in the Last Year: Never true  Transportation Needs: No Transportation Needs  (10/24/2021)   PRAPARE - Administrator, Civil Service (Medical): No    Lack of Transportation (Non-Medical): No  Recent Concern: Transportation Needs - Unmet Transportation Needs (08/05/2021)   PRAPARE - Transportation    Lack of Transportation (Medical): Yes    Lack of Transportation (Non-Medical): Yes  Physical Activity: Insufficiently Active (10/24/2021)   Exercise Vital Sign    Days of Exercise per Week: 5 days    Minutes of Exercise per Session: 20 min  Stress: No Stress Concern Present (10/24/2021)   [employer] of Occupational Health - Occupational Stress Questionnaire    Feeling of Stress : Not at all  Social Connections: Socially Isolated (10/24/2021)   Social Connection and Isolation Panel [NHANES]    Frequency of Communication with Friends and Family: More than three times a week    Frequency of Social Gatherings with Friends and Family: More than three times a week    Attends Religious Services: Never    Database administrator or Organizations: No    Attends Banker Meetings: Never    Marital Status: Widowed    Family History  Problem Relation Age of Onset   Heart failure Mother    Diabetes Mother    Congestive Heart Failure Mother    CVA Mother    Heart attack Father    Arthritis Father    Diabetes Sister    Hypertension Sister    Lung cancer Sister    Hypertension Brother    Hypertension Brother    Hemochromatosis Brother    Stroke Maternal Grandmother    Heart attack Maternal Grandfather    Heart attack Paternal Grandmother    Breast cancer Neg Hx     Outpatient Encounter Medications as of 08/27/2023  Medication Sig   amiodarone (PACERONE) 200 MG tablet TAKE 1 TABLET BY MOUTH EVERY DAY   Blood Glucose Monitoring Suppl (ONE TOUCH ULTRA SYSTEM KIT) w/Device KIT Check sugar once daily. DX E11.9   digoxin (LANOXIN) 0.125 MG tablet Take 0.0625 mg by mouth daily.   ELIQUIS 5 MG TABS tablet TAKE 1 TABLET BY MOUTH TWICE A DAY    fluticasone (FLONASE) 50 MCG/ACT nasal spray Place 2 sprays into the nose daily.   glucose blood (ONETOUCH ULTRA) test strip Use to check blood glucose four times daily as directed   Insulin Pen Needle 32G X 4  MM MISC Inject twice daily, SQ. DX E11.9   Lancets (ONETOUCH ULTRASOFT) lancets Check sugar once daily DX E11.9   levothyroxine (SYNTHROID) 150 MCG tablet Take 1 tablet (150 mcg total) by mouth every morning.   losartan (COZAAR) 25 MG tablet TAKE 1/2 TABLET BY MOUTH DAILY   metoprolol succinate (TOPROL-XL) 25 MG 24 hr tablet TAKE 1 TABLET (25 MG TOTAL) BY MOUTH DAILY.   NON FORMULARY CPAP AT BEDTIME   rosuvastatin (CRESTOR) 10 MG tablet Take 1 tablet (10 mg total) by mouth daily.   spironolactone (ALDACTONE) 25 MG tablet Take 1 tablet (25 mg total) by mouth daily.   torsemide (DEMADEX) 20 MG tablet TAKE 1 TABLET BY MOUTH EVERY DAY   [DISCONTINUED] glipiZIDE (GLUCOTROL XL) 5 MG 24 hr tablet TAKE 1 TABLET BY MOUTH EVERY DAY WITH BREAKFAST   [DISCONTINUED] insulin glargine (LANTUS SOLOSTAR) 100 UNIT/ML Solostar Pen Inject 50 Units into the skin at bedtime.   glipiZIDE (GLUCOTROL XL) 5 MG 24 hr tablet Take 1 tablet (5 mg total) by mouth daily with breakfast.   insulin glargine (LANTUS SOLOSTAR) 100 UNIT/ML Solostar Pen Inject 50 Units into the skin at bedtime.   No facility-administered encounter medications on file as of 08/27/2023.    ALLERGIES: Allergies  Allergen Reactions   Entresto [Sacubitril-Valsartan] Other (See Comments)    hypotension    VACCINATION STATUS: Immunization History  Administered Date(s) Administered   Moderna Covid-19 Fall Seasonal Vaccine 53yrs & older 05/22/2023   PNEUMOCOCCAL CONJUGATE-20 06/22/2021   Pneumococcal Polysaccharide-23 05/13/2009   Tdap 05/13/2009    Diabetes She presents for her follow-up diabetic visit. She has type 2 diabetes mellitus. Onset time: Diagnosed at approx age of 60. Her disease course has been improving. There are no  hypoglycemic associated symptoms. Associated symptoms include foot paresthesias. There are no hypoglycemic complications. Symptoms are stable. Diabetic complications include heart disease (CHF) and nephropathy. Risk factors for coronary artery disease include diabetes mellitus, dyslipidemia, family history, hypertension, sedentary lifestyle and post-menopausal. Current diabetic treatment includes insulin injections and oral agent (monotherapy). She is compliant with treatment most of the time. Her weight is fluctuating minimally. She is following a generally healthy diet. When asked about meal planning, she reported none. She has had a previous visit with a dietitian. She participates in exercise intermittently. Her home blood glucose trend is decreasing steadily. Her breakfast blood glucose range is generally 110-130 mg/dl. Her overall blood glucose range is 140-180 mg/dl. (She presents today, with her CGM and meter showing mostly at goal glycemic profile.  Her POCT A1c today is 6.6%, improving from last visit of 7%.  Analysis of her CGM shows TIR 82%, TAR 18%, TBR 0% with a GMI of 6.9%.  She denies any hypoglycemia.) An ACE inhibitor/angiotensin II receptor blocker is being taken. She sees a podiatrist.Eye exam is current.   Review of systems  Constitutional: + Minimally fluctuating body weight,  current Body mass index is 32.08 kg/m. , no fatigue, no subjective hyperthermia, no subjective hypothermia Eyes: no blurry vision, no xerophthalmia ENT: no sore throat, no nodules palpated in throat, no dysphagia/odynophagia, no hoarseness Cardiovascular: no chest pain, no shortness of breath, no palpitations, no leg swelling Respiratory: no cough, no shortness of breath Gastrointestinal: no nausea/vomiting/diarrhea Musculoskeletal: no muscle/joint aches Skin: no rashes, no hyperemia Neurological: no tremors, no numbness, no tingling, no dizziness Psychiatric: no depression, no anxiety  Objective:      BP 120/80 (BP Location: Right Arm, Patient Position: Sitting, Cuff Size: Large)  Pulse (!) 58   Ht 5\' 2"  (1.575 m)   Wt 175 lb 6.4 oz (79.6 kg)   BMI 32.08 kg/m   Wt Readings from Last 3 Encounters:  08/27/23 175 lb 6.4 oz (79.6 kg)  07/19/23 172 lb 8 oz (78.2 kg)  06/29/23 172 lb 8 oz (78.2 kg)     BP Readings from Last 3 Encounters:  08/27/23 120/80  06/29/23 120/70  04/24/23 100/60      Physical Exam- Limited  Constitutional:  Body mass index is 32.08 kg/m. , not in acute distress, normal state of mind Eyes:  EOMI, no exophthalmos Musculoskeletal: no gross deformities, strength intact in all four extremities, no gross restriction of joint movements Skin:  no rashes, no hyperemia Neurological: no tremor with outstretched hands   Diabetic Foot Exam - Simple   No data filed     CMP ( most recent) CMP     Component Value Date/Time   NA 139 04/17/2023 1130   K 4.4 04/17/2023 1130   CL 101 04/17/2023 1130   CO2 20 04/17/2023 1130   GLUCOSE 117 (H) 04/17/2023 1130   GLUCOSE 261 (H) 01/17/2022 1427   BUN 55 (H) 04/17/2023 1130   CREATININE 1.75 (H) 04/17/2023 1130   CALCIUM 9.3 04/17/2023 1130   PROT 6.8 04/17/2023 1130   ALBUMIN 4.1 04/17/2023 1130   AST 21 04/17/2023 1130   ALT 19 04/17/2023 1130   ALKPHOS 75 04/17/2023 1130   BILITOT 0.4 04/17/2023 1130   GFRNONAA 40 (L) 01/17/2022 1427   GFRAA >60 12/16/2019 1214     Diabetic Labs (most recent): Lab Results  Component Value Date   HGBA1C 6.6 (A) 08/27/2023   HGBA1C 7.0 (A) 04/24/2023   HGBA1C 7.1 (A) 12/19/2022     Lipid Panel ( most recent) Lipid Panel     Component Value Date/Time   CHOL 139 04/17/2023 1130   TRIG 227 (H) 04/17/2023 1130   HDL 28 (L) 04/17/2023 1130   CHOLHDL 5.0 (H) 04/17/2023 1130   LDLCALC 73 04/17/2023 1130   LABVLDL 38 04/17/2023 1130      Lab Results  Component Value Date   TSH 1.630 04/17/2023   TSH 8.127 (H) 07/13/2021   TSH 7.020 (H) 06/22/2021   TSH  0.841 11/19/2019   TSH 14.720 (H) 03/18/2018   TSH 2.610 10/18/2016   TSH 0.978 10/11/2015   TSH 40.220 (H) 06/02/2015   TSH 3.400 01/15/2015   TSH 35.980 (H) 01/01/2015   FREET4 1.60 04/17/2023   FREET4 0.65 07/13/2021   FREET4 1.50 01/15/2015           Assessment & Plan:   1) 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  Her POCT A1c today is 6.6%, improving from last visit of 7%.  Analysis of her CGM shows TIR 82%, TAR 18%, TBR 0% with a GMI of 6.9%.  She denies any hypoglycemia.  - Blanche Scovell has currently uncontrolled symptomatic type 2 DM since 68 years of age.   -Recent labs reviewed.  - I had a long discussion with her about the progressive nature of diabetes and the pathology behind its complications. -her diabetes is complicated by CKD stage 3b, CHF, neuropathy and she remains at a high risk for more acute and chronic complications which include CAD, CVA, CKD, retinopathy, and neuropathy. These are all discussed in detail with her.  The following Lifestyle Medicine recommendations according to American College of Lifestyle Medicine [facility]) were discussed and offered to patient and she agrees to start the journey:  A. Whole Foods, Plant-based plate comprising of fruits and vegetables, plant-based proteins, whole-grain carbohydrates was discussed in detail with the patient.   A list for source of those nutrients were also provided to the patient.  Patient will use only water or unsweetened tea for hydration. B.  The need to stay away from risky substances including alcohol, smoking; obtaining 7 to 9 hours of restorative sleep, at least 150 minutes of moderate intensity exercise weekly, the importance of healthy social connections,  and stress reduction techniques were discussed. C.  A full color page of  Calorie density of various food groups per pound showing  examples of each food groups was provided to the patient.  - Nutritional counseling repeated at each appointment due to patients tendency to fall back in to old habits.  - The patient admits there is a room for improvement in their diet and drink choices. -  Suggestion is made for the patient to avoid simple carbohydrates from their diet including Cakes, Sweet Desserts / Pastries, Ice Cream, Soda (diet and regular), Sweet Tea, Candies, Chips, Cookies, Sweet Pastries, Store Bought Juices, Alcohol in Excess of 1-2 drinks a day, Artificial Sweeteners, Coffee Creamer, and "Sugar-free" Products. This will help patient to have stable blood glucose profile and potentially avoid unintended weight gain.   - I encouraged the patient to switch to unprocessed or minimally processed complex starch and increased protein intake (animal or plant source), fruits, and vegetables.   - Patient is advised to stick to a routine mealtimes to eat 3 meals a day and avoid unnecessary snacks (to snack only to correct hypoglycemia).  - I have approached her with the following individualized plan to manage her diabetes and patient agrees:   -She is advised to continue her Lantus 50 units SQ nightly and Glipizide 5 mg XL daily with breakfast, given her optimal glucose control, no changes were made today.  -she is encouraged to continue monitoring glucose 4 times daily (using her CGM), before meals and before bed, and to call the clinic if she has readings less than 70 or above 300 for 3 tests in a row.  - she is warned not to take insulin without proper monitoring per orders. - Adjustment parameters are given to her for hypo and hyperglycemia in writing.  She did not tolerate Jardiance in the past (yeast infections and worsening renal failure).  - Specific targets for  A1c; LDL, HDL, and Triglycerides were discussed with the patient.  2) Blood Pressure /Hypertension:  her blood pressure is controlled to target.   she is  advised to continue her current medications including Losartan 12.5 mg p.o. daily with breakfast, Metoprolol 25 mg po daily, Demadex 20 mg po daily and Spironolactone 12.5 mg po daily.  Kidney function has worsened slightly.  3) Lipids/Hyperlipidemia:    Review of her recent lipid panel from 04/17/23 showed controlled LDL at 73 and elevated triglycerides of 227 .  she is advised to continue Crestor 10 mg daily at bedtime.  Side effects and precautions discussed with her.    4)  Weight/Diet:  her Body mass index is 32.08 kg/m.  -  clearly complicating her diabetes care.   she is a candidate for weight loss. I discussed with her the fact that loss of 5 - 10% of her  current body weight will  have the most impact on her diabetes management.  Exercise, and detailed carbohydrates information provided  -  detailed on discharge instructions.  5) Hypothyroidism The details of her diagnosis are not available to review.   She is on Amiodarone which can throw off her thyroid function.    There are no recent TFTs to review, will recheck prior to next visit and adjust dose accordingly. She is currently on Levothyroxine 150 mcg po daily before breakfast and is advised to continue this for now.  6) Chronic Care/Health Maintenance: -she is on ACEI/ARB and Statin medications and is encouraged to initiate and continue to follow up with Ophthalmology, Dentist, Podiatrist at least yearly or according to recommendations, and advised to stay away from smoking. I have recommended yearly flu vaccine and pneumonia vaccine at least every 5 years; moderate intensity exercise for up to 150 minutes weekly; and sleep for at least 7 hours a day.  - she is advised to maintain close follow up with Patient, No Pcp Per for primary care needs, as well as her other providers for optimal and coordinated care.     I spent  43  minutes in the care of the patient today including review of labs from CMP, Lipids, Thyroid Function,  Hematology (current and previous including abstractions from other facilities); face-to-face time discussing  her blood glucose readings/logs, discussing hypoglycemia and hyperglycemia episodes and symptoms, medications doses, her options of short and long term treatment based on the latest standards of care / guidelines;  discussion about incorporating lifestyle medicine;  and documenting the encounter. Risk reduction counseling performed per USPSTF guidelines to reduce obesity and cardiovascular risk factors.     Please refer to Patient Instructions for Blood Glucose Monitoring and Insulin/Medications Dosing Guide"  in media tab for additional information. Please  also refer to " Patient Self Inventory" in the Media  tab for reviewed elements of pertinent patient history.  Noralee Chars participated in the discussions, expressed understanding, and voiced agreement with the above plans.  All questions were answered to her satisfaction. she is encouraged to contact clinic should she have any questions or concerns prior to her return visit.     Follow up plan: - Return in about 4 months (around 12/25/2023) for Diabetes F/U with A1c in office, Thyroid follow up, Previsit labs, Bring meter and logs.   Ronny Bacon, [facility] [address] [place], [place] [address] Phone: [phone] Fax: [phone]  08/27/2023, 2:50 PM

## 2023-09-15 ENCOUNTER — Other Ambulatory Visit: Payer: Self-pay | Admitting: Cardiovascular Disease

## 2023-09-19 ENCOUNTER — Other Ambulatory Visit: Payer: Self-pay | Admitting: Nurse Practitioner

## 2023-09-19 DIAGNOSIS — E119 Type 2 diabetes mellitus without complications: Secondary | ICD-10-CM

## 2023-09-19 MED ORDER — INSULIN GLARGINE 100 UNIT/ML SOLOSTAR PEN: 50.0000 [IU] | PEN_INJECTOR | Freq: Every day | SUBCUTANEOUS | 0 refills | Status: DC

## 2023-10-18 ENCOUNTER — Encounter: Payer: Self-pay | Admitting: Podiatry

## 2023-10-18 ENCOUNTER — Ambulatory Visit (INDEPENDENT_AMBULATORY_CARE_PROVIDER_SITE_OTHER): Payer: Medicare Other | Admitting: Podiatry

## 2023-10-18 DIAGNOSIS — M79675 Pain in left toe(s): Secondary | ICD-10-CM

## 2023-10-18 DIAGNOSIS — M79674 Pain in right toe(s): Secondary | ICD-10-CM

## 2023-10-18 DIAGNOSIS — B351 Tinea unguium: Secondary | ICD-10-CM

## 2023-10-18 DIAGNOSIS — Z794 Long term (current) use of insulin: Secondary | ICD-10-CM | POA: Diagnosis not present

## 2023-10-18 DIAGNOSIS — E1142 Type 2 diabetes mellitus with diabetic polyneuropathy: Secondary | ICD-10-CM

## 2023-10-22 ENCOUNTER — Encounter: Payer: Self-pay | Admitting: Podiatry

## 2023-10-22 NOTE — Progress Notes (Signed)
  Subjective:  Patient ID: Michele [name], female    DOB: 1955-08-01,  MRN: [id number]  68 y.o. female presents at risk foot care with history of diabetic neuropathy and painful, elongated thickened toenails x 10 which are symptomatic when wearing enclosed shoe gear. This interferes with his/her daily activities. Chief Complaint  Patient presents with   Diabetes    "Get my toenails clipped."  Hulon Magic, NP - 08/27/2023; A1c - 6.6   New problem(s): None   PCP is Wendel Hals, NP.  Allergies  Allergen Reactions   Entresto [Sacubitril-Valsartan] Other (See Comments)    hypotension    Review of Systems: Negative except as noted in the HPI.   Objective:  Michele [name] is a pleasant 67 y.o. female in NAD. AAO x 3.  Vascular Examination: Vascular status intact b/l with palpable pedal pulses. CFT immediate b/l. Pedal hair present. No edema. No pain with calf compression b/l. Skin temperature gradient WNL b/l. No varicosities noted. No cyanosis or clubbing noted.  Neurological Examination: Protective sensation intact with 10 gram monofilament right lower extremity. Protective sensation diminished with 10 gram monofilament left lower extremity.  Dermatological Examination: Pedal skin with normal turgor, texture and tone b/l. No open wounds nor interdigital macerations noted. Toenails 1-5 b/l thick, discolored, elongated with subungual debris and pain on dorsal palpation. No hyperkeratotic lesions noted b/l.   Musculoskeletal Examination: Muscle strength 5/5 to b/l LE.  HAV with bunion deformity noted b/l LE.  Radiographs: None  Last A1c:      Latest Ref Rng & Units 08/27/2023    2:37 PM 04/24/2023   11:39 AM 12/19/2022   11:37 AM  Hemoglobin A1C  Hemoglobin-A1c 4.0 - 5.6 % 6.6  7.0  7.1    Assessment:   1. Pain due to onychomycosis of toenails of both feet   2. Type 2 diabetes mellitus with diabetic polyneuropathy, with long-term current use of insulin (HCC)     Plan:  Consent given for treatment. Patient examined. All patient's and/or POA's questions/concerns addressed on today's visit. Mycotic toenails 1-5 debrided in length and girth without incident. Continue foot and shoe inspections daily. Monitor blood glucose per PCP/Endocrinologist's recommendations.Continue soft, supportive shoe gear daily. Report any pedal injuries to medical professional. Call office if there are any quesitons/concerns. -Patient/POA to call should there be question/concern in the interim.  Return in about 3 months (around 01/17/2024).  Luella Sager, DPM      [facility] LOCATION: [address] [place] [address]                   Office [phone]   [facility] LOCATION: [address] [place], [place] [address] Office [phone]

## 2023-12-04 ENCOUNTER — Other Ambulatory Visit: Payer: Self-pay | Admitting: General Surgery

## 2023-12-04 DIAGNOSIS — Z1231 Encounter for screening mammogram for malignant neoplasm of breast: Secondary | ICD-10-CM

## 2023-12-13 LAB — COMPREHENSIVE METABOLIC PANEL WITH GFR
ALT: 16 IU/L (ref 0–32)
AST: 17 IU/L (ref 0–40)
Albumin: 3.9 g/dL (ref 3.9–4.9)
Alkaline Phosphatase: 75 IU/L (ref 44–121)
BUN/Creatinine Ratio: 33 — ABNORMAL HIGH (ref 12–28)
BUN: 44 mg/dL — ABNORMAL HIGH (ref 8–27)
Bilirubin Total: 0.3 mg/dL (ref 0.0–1.2)
CO2: 19 mmol/L — ABNORMAL LOW (ref 20–29)
Calcium: 9 mg/dL (ref 8.7–10.3)
Chloride: 106 mmol/L (ref 96–106)
Creatinine, Ser: 1.32 mg/dL — ABNORMAL HIGH (ref 0.57–1.00)
Globulin, Total: 2.8 g/dL (ref 1.5–4.5)
Glucose: 122 mg/dL — ABNORMAL HIGH (ref 70–99)
Potassium: 4.7 mmol/L (ref 3.5–5.2)
Sodium: 140 mmol/L (ref 134–144)
Total Protein: 6.7 g/dL (ref 6.0–8.5)
eGFR: 44 mL/min/{1.73_m2} — ABNORMAL LOW (ref >59)

## 2023-12-13 LAB — T4, FREE: Free T4: 1.54 ng/dL (ref 0.82–1.77)

## 2023-12-13 LAB — TSH: TSH: 0.889 u[IU]/mL (ref 0.450–4.500)

## 2023-12-16 ENCOUNTER — Other Ambulatory Visit: Payer: Self-pay | Admitting: Cardiovascular Disease

## 2023-12-17 ENCOUNTER — Telehealth: Payer: Self-pay | Admitting: Nurse Practitioner

## 2023-12-17 LAB — HM DIABETES EYE EXAM

## 2023-12-17 NOTE — Telephone Encounter (Signed)
 Prescription refill request for Eliquis  received. Indication:afib Last office visit:12/24 Scr:1.32  6/25 Age: 68 Weight:79.6  kg  Prescription refilled

## 2023-12-17 NOTE — Telephone Encounter (Signed)
 Pt moved appt to Sept, does she need to redo labs and if so can you put order in?

## 2023-12-17 NOTE — Telephone Encounter (Signed)
 Pt changed mind and kept June appt

## 2023-12-25 ENCOUNTER — Ambulatory Visit: Payer: Medicare Other | Admitting: Nurse Practitioner

## 2023-12-25 ENCOUNTER — Encounter: Payer: Self-pay | Admitting: Nurse Practitioner

## 2023-12-25 ENCOUNTER — Ambulatory Visit (INDEPENDENT_AMBULATORY_CARE_PROVIDER_SITE_OTHER): Admitting: Nurse Practitioner

## 2023-12-25 VITALS — BP 132/80 | HR 74 | Ht 62.0 in | Wt 179.2 lb

## 2023-12-25 DIAGNOSIS — E119 Type 2 diabetes mellitus without complications: Secondary | ICD-10-CM

## 2023-12-25 DIAGNOSIS — E039 Hypothyroidism, unspecified: Secondary | ICD-10-CM

## 2023-12-25 DIAGNOSIS — Z7984 Long term (current) use of oral hypoglycemic drugs: Secondary | ICD-10-CM | POA: Diagnosis not present

## 2023-12-25 DIAGNOSIS — E559 Vitamin D deficiency, unspecified: Secondary | ICD-10-CM

## 2023-12-25 DIAGNOSIS — Z794 Long term (current) use of insulin: Secondary | ICD-10-CM | POA: Diagnosis not present

## 2023-12-25 LAB — POCT GLYCOSYLATED HEMOGLOBIN (HGB A1C): Hemoglobin A1C: 6.6 % — AB (ref 4.0–5.6)

## 2023-12-25 MED ORDER — GLIPIZIDE ER 5 MG PO TB24: 5.0000 mg | ORAL_TABLET | Freq: Every day | ORAL | 1 refills | Status: DC

## 2023-12-25 MED ORDER — LANTUS SOLOSTAR 100 UNIT/ML ~~LOC~~ SOPN: 50.0000 [IU] | PEN_INJECTOR | Freq: Every day | SUBCUTANEOUS | 3 refills | Status: DC

## 2023-12-25 MED ORDER — LEVOTHYROXINE SODIUM 150 MCG PO TABS: 150.0000 ug | ORAL_TABLET | Freq: Every morning | ORAL | 3 refills | Status: DC

## 2023-12-25 NOTE — Progress Notes (Signed)
 Endocrinology Follow Up Note       12/25/2023, 2:11 PM   Subjective:    Patient ID: Michele [name], female    DOB: June 21, 1956.  Michele [name] is being seen in follow up after being seen in consultation for management of currently uncontrolled symptomatic diabetes requested by  Wendel Hals, NP.   Past Medical History:  Diagnosis Date   Abscess of breast, right 08/19/2012   Breast cancer (HCC) 07/13/2010   1.5 cm,intermediate grade DCIS, nuclear grade 2, ER 90%, PR 90% treated with wide excision, reexcision to negative margins and MammoSite partial breast radiation.   Coronary artery disease    a. 2009 Cath: nonobs LAD dzs; b. 07/2021 Cath: LM nl, LAD 63m, D1 mild dzs, D2 40, LCX nl, RCA 40p. PCWP 24. PA 37/26(30).   DCIS (ductal carcinoma in situ) of breast, right 2012   Diabetes mellitus (HCC)    Type II   GERD (gastroesophageal reflux disease)    HFrEF (heart failure with reduced ejection fraction) (HCC)    a. 2009 Echo: LV dysfxn; b. 2013 Echo: EF 50-55%; c. 07/2021 Echo: EF<20%, glob HK - in/inflat moves best. Nl PASP. Sev dil LA. Mildly dil RA. Mild-mod MR; d. 01/2022 Echo: EF 50-55%, no rwma, mod LVH, GrI DD, nl RV fxn.   Hyperlipidemia    Hypertension    Ischemic cardiomyopathy    a. 2009 Echo: LV dysfxn; b. 2013 Echo: EF 50-55%; c. 07/2021 Echo: EF<20% d. 01/2022 Echo: EF 50-55%, no rwma, mod LVH, GrI DD, nl RV fxn.   Obesity, unspecified    Persistent atrial fibrillation (HCC)    a. 07/2021 s/p TEE and DCCV (150J)-->amio/eliquis  (CHA2DS2VASc 6).   Personal history of radiation therapy    Sleep apnea    Thyroid disease    hypothyroidism   Umbilical hernia without mention of obstruction or gangrene     Past Surgical History:  Procedure Laterality Date   BREAST BIOPSY Right 2007   right bx neg   BREAST BIOPSY Right 08/10/2016   fat necrosis/ done in Dr. Butch Cashing office   BREAST EXCISIONAL BIOPSY  Right 2011   DCIS mammosite lumpectomy   BREAST LUMPECTOMY Right Jan 2012   Wide excision   BREAST MASS EXCISION Right 2011   December   CARDIAC CATHETERIZATION  01/08/2008   CHOLECYSTECTOMY  2013   [facility]   HERNIA REPAIR  2013   epigastric   INCISION AND DRAINAGE BREAST ABSCESS Right 08/19/2012   RIGHT/LEFT HEART CATH AND CORONARY ANGIOGRAPHY N/A 07/15/2021   Procedure: RIGHT/LEFT HEART CATH AND CORONARY ANGIOGRAPHY;  Surgeon: Wenona Hamilton, MD;  Location: [facility];  Service: Cardiovascular;  Laterality: N/A;   TEE WITHOUT CARDIOVERSION N/A 07/19/2021   Procedure: TRANSESOPHAGEAL ECHOCARDIOGRAM (TEE);  Surgeon: Gollan, Timothy J, MD;  Location: [facility] OR[facility];  Service: Cardiovascular;  Laterality: N/A;   TONSILLECTOMY AND ADENOIDECTOMY     age 68 yrs    Social History   Socioeconomic History   Marital status: Widowed    Spouse name: Michele [name]   Number of children: 0   Years of education: Not on file   Highest education level:  Not on file  Occupational History   Occupation: retired    Associate Professor: [employer]  Tobacco Use   Smoking status: Never   Smokeless tobacco: Never  Vaping Use   Vaping status: Never Used  Substance and Sexual Activity   Alcohol use: No   Drug use: No   Sexual activity: Not on file  Other Topics Concern   Not on file  Social History Narrative   Never smoked; no alcohol; lives in [place]; self;    husband passed in 2020.    Retired from [employer].    Good relationship with in-laws - they help when she needs it   Social Drivers of Corporate investment banker Strain: Low Risk  (10/24/2021)   Overall Financial Resource Strain (CARDIA)    Difficulty of Paying Living Expenses: Not hard at all  Food Insecurity: No Food Insecurity (10/24/2021)   Hunger Vital Sign    Worried About Running Out of Food in the Last Year: Never true    Ran Out of Food in the Last Year: Never true  Transportation Needs: No Transportation Needs  (10/24/2021)   PRAPARE - Administrator, Civil Service (Medical): No    Lack of Transportation (Non-Medical): No  Recent Concern: Transportation Needs - Unmet Transportation Needs (08/05/2021)   PRAPARE - Transportation    Lack of Transportation (Medical): Yes    Lack of Transportation (Non-Medical): Yes  Physical Activity: Insufficiently Active (10/24/2021)   Exercise Vital Sign    Days of Exercise per Week: 5 days    Minutes of Exercise per Session: 20 min  Stress: No Stress Concern Present (10/24/2021)   [employer] of Occupational Health - Occupational Stress Questionnaire    Feeling of Stress : Not at all  Social Connections: Socially Isolated (10/24/2021)   Social Connection and Isolation Panel    Frequency of Communication with Friends and Family: More than three times a week    Frequency of Social Gatherings with Friends and Family: More than three times a week    Attends Religious Services: Never    Database administrator or Organizations: No    Attends Banker Meetings: Never    Marital Status: Widowed    Family History  Problem Relation Age of Onset   Heart failure Mother    Diabetes Mother    Congestive Heart Failure Mother    CVA Mother    Heart attack Father    Arthritis Father    Diabetes Sister    Hypertension Sister    Lung cancer Sister    Hypertension Brother    Hypertension Brother    Hemochromatosis Brother    Stroke Maternal Grandmother    Heart attack Maternal Grandfather    Heart attack Paternal Grandmother    Breast cancer Neg Hx     Outpatient Encounter Medications as of 12/25/2023  Medication Sig   amiodarone  (PACERONE ) 200 MG tablet TAKE 1 TABLET BY MOUTH EVERY DAY   Blood Glucose Monitoring Suppl (ONE TOUCH ULTRA SYSTEM KIT) w/Device KIT Check sugar once daily. DX E11.9   digoxin  (LANOXIN ) 0.125 MG tablet Take 0.0625 mg by mouth daily.   ELIQUIS  5 MG TABS tablet TAKE 1 TABLET BY MOUTH TWICE A DAY   fluticasone  (FLONASE) 50 MCG/ACT nasal spray Place 2 sprays into the nose daily. (Patient taking differently: Place 2 sprays into the nose daily. Patient takes as needed)   glucose blood (ONETOUCH ULTRA) test strip Use to check blood glucose  four times daily as directed   insulin  glargine (LANTUS ) 100 UNIT/ML Solostar Pen Inject 50 Units into the skin at bedtime.   Insulin  Pen Needle 32G X 4 MM MISC Inject twice daily, SQ. DX E11.9   Lancets (ONETOUCH ULTRASOFT) lancets Check sugar once daily DX E11.9   losartan  (COZAAR ) 25 MG tablet TAKE 1/2 TABLET BY MOUTH DAILY   metoprolol  succinate (TOPROL -XL) 25 MG 24 hr tablet TAKE 1 TABLET (25 MG TOTAL) BY MOUTH DAILY.   NON FORMULARY CPAP AT BEDTIME   prednisoLONE acetate (PRED FORTE) 1 % ophthalmic suspension Place 1 drop into both eyes 4 (four) times daily.   rosuvastatin  (CRESTOR ) 10 MG tablet Take 1 tablet (10 mg total) by mouth daily.   spironolactone  (ALDACTONE ) 25 MG tablet Take 1 tablet (25 mg total) by mouth daily.   torsemide  (DEMADEX ) 20 MG tablet TAKE 1 TABLET BY MOUTH EVERY DAY   [DISCONTINUED] glipiZIDE  (GLUCOTROL  XL) 5 MG 24 hr tablet Take 1 tablet (5 mg total) by mouth daily with breakfast.   [DISCONTINUED] insulin  glargine (LANTUS  SOLOSTAR) 100 UNIT/ML Solostar Pen Inject 50 Units into the skin at bedtime.   [DISCONTINUED] levothyroxine  (SYNTHROID ) 150 MCG tablet Take 1 tablet (150 mcg total) by mouth every morning.   glipiZIDE  (GLUCOTROL  XL) 5 MG 24 hr tablet Take 1 tablet (5 mg total) by mouth daily with breakfast.   insulin  glargine (LANTUS  SOLOSTAR) 100 UNIT/ML Solostar Pen Inject 50 Units into the skin at bedtime.   levothyroxine  (SYNTHROID ) 150 MCG tablet Take 1 tablet (150 mcg total) by mouth every morning.   No facility-administered encounter medications on file as of 12/25/2023.    ALLERGIES: Allergies  Allergen Reactions   Entresto  [Sacubitril -Valsartan ] Other (See Comments)    hypotension    VACCINATION STATUS: Immunization  History  Administered Date(s) Administered   Moderna Covid-19 Fall Seasonal Vaccine 56yrs & older 05/22/2023   PNEUMOCOCCAL CONJUGATE-20 06/22/2021   Pneumococcal Polysaccharide-23 05/13/2009   Tdap 05/13/2009    Diabetes She presents for her follow-up diabetic visit. She has type 2 diabetes mellitus. Onset time: Diagnosed at approx age of 14. Her disease course has been stable. There are no hypoglycemic associated symptoms. Associated symptoms include foot paresthesias. There are no hypoglycemic complications. Symptoms are stable. Diabetic complications include heart disease (CHF) and nephropathy. Risk factors for coronary artery disease include diabetes mellitus, dyslipidemia, family history, hypertension, sedentary lifestyle and post-menopausal. Current diabetic treatment includes insulin  injections and oral agent (monotherapy). She is compliant with treatment most of the time. Her weight is fluctuating minimally. She is following a generally healthy diet. When asked about meal planning, she reported none. She has had a previous visit with a dietitian. She participates in exercise intermittently. Her home blood glucose trend is fluctuating minimally. Her breakfast blood glucose range is generally 110-130 mg/dl. Her overall blood glucose range is 140-180 mg/dl. (She presents today, with her CGM and meter showing mostly at goal glycemic profile.  Her POCT A1c today is 6.6%, unchanged from previous visit.  Analysis of her CGM shows TIR 75%, TAR 24%, TBR 1% with a GMI of 6.8%.  She denies any severe hypoglycemia.  She did get an alert one night with her CGM but it was a new sensor and she did not have symptoms, so she checked a fingerstick and it was normal.) An ACE inhibitor/angiotensin II receptor blocker is being taken. She sees a podiatrist.Eye exam is current.   Review of systems  Constitutional: + Minimally fluctuating body weight,  current  Body mass index is 32.78 kg/m. , no fatigue, no  subjective hyperthermia, no subjective hypothermia Eyes: no blurry vision, no xerophthalmia ENT: no sore throat, no nodules palpated in throat, no dysphagia/odynophagia, no hoarseness Cardiovascular: no chest pain, no shortness of breath, no palpitations, no leg swelling Respiratory: no cough, no shortness of breath Gastrointestinal: no nausea/vomiting/diarrhea Musculoskeletal: no muscle/joint aches Skin: no rashes, no hyperemia Neurological: no tremors, no numbness, no tingling, no dizziness Psychiatric: no depression, no anxiety  Objective:     BP 132/80 (BP Location: Left Arm, Patient Position: Sitting, Cuff Size: Large)   Pulse 74   Ht 5' 2 (1.575 m)   Wt 179 lb 3.2 oz (81.3 kg)   BMI 32.78 kg/m   Wt Readings from Last 3 Encounters:  12/25/23 179 lb 3.2 oz (81.3 kg)  08/27/23 175 lb 6.4 oz (79.6 kg)  07/19/23 172 lb 8 oz (78.2 kg)     BP Readings from Last 3 Encounters:  12/25/23 132/80  08/27/23 120/80  06/29/23 120/70      Physical Exam- Limited  Constitutional:  Body mass index is 32.78 kg/m. , not in acute distress, normal state of mind Eyes:  EOMI, no exophthalmos Musculoskeletal: no gross deformities, strength intact in all four extremities, no gross restriction of joint movements Skin:  no rashes, no hyperemia Neurological: no tremor with outstretched hands   Diabetic Foot Exam - Simple   No data filed     CMP ( most recent) CMP     Component Value Date/Time   NA 140 12/12/2023 0948   K 4.7 12/12/2023 0948   CL 106 12/12/2023 0948   CO2 19 (L) 12/12/2023 0948   GLUCOSE 122 (H) 12/12/2023 0948   GLUCOSE 261 (H) 01/17/2022 1427   BUN 44 (H) 12/12/2023 0948   CREATININE 1.32 (H) 12/12/2023 0948   CALCIUM  9.0 12/12/2023 0948   PROT 6.7 12/12/2023 0948   ALBUMIN 3.9 12/12/2023 0948   AST 17 12/12/2023 0948   ALT 16 12/12/2023 0948   ALKPHOS 75 12/12/2023 0948   BILITOT 0.3 12/12/2023 0948   GFRNONAA 40 (L) 01/17/2022 1427   GFRAA >60  12/16/2019 1214     Diabetic Labs (most recent): Lab Results  Component Value Date   HGBA1C 6.6 (A) 12/25/2023   HGBA1C 6.6 (A) 08/27/2023   HGBA1C 7.0 (A) 04/24/2023     Lipid Panel ( most recent) Lipid Panel     Component Value Date/Time   CHOL 139 04/17/2023 1130   TRIG 227 (H) 04/17/2023 1130   HDL 28 (L) 04/17/2023 1130   CHOLHDL 5.0 (H) 04/17/2023 1130   LDLCALC 73 04/17/2023 1130   LABVLDL 38 04/17/2023 1130      Lab Results  Component Value Date   TSH 0.889 12/12/2023   TSH 1.630 04/17/2023   TSH 8.127 (H) 07/13/2021   TSH 7.020 (H) 06/22/2021   TSH 0.841 11/19/2019   TSH 14.720 (H) 03/18/2018   TSH 2.610 10/18/2016   TSH 0.978 10/11/2015   TSH 40.220 (H) 06/02/2015   TSH 3.400 01/15/2015   FREET4 1.54 12/12/2023   FREET4 1.60 04/17/2023   FREET4 0.65 07/13/2021   FREET4 1.50 01/15/2015           Assessment & Plan:   1) 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  Her POCT A1c today is 6.6%, unchanged from previous visit.  Analysis of her CGM shows TIR 75%, TAR 24%, TBR 1% with a GMI of 6.8%.  She denies any severe hypoglycemia.  She did get an alert one night with her CGM but it was a new sensor and she did not have symptoms, so she checked a fingerstick and it was normal.  - Michele [name] has currently uncontrolled symptomatic type 2 DM since 68 years of age.   -Recent labs reviewed.  - I had a long discussion with her about the progressive nature of diabetes and the pathology behind its complications. -her diabetes is complicated by CKD stage 3b, CHF, neuropathy and she remains at a high risk for more acute and chronic complications which include CAD, CVA, CKD, retinopathy, and neuropathy. These are all discussed in detail with her.  The following Lifestyle Medicine recommendations according to American College of Lifestyle Medicine  [facility]) were discussed and offered to patient and she agrees to start the journey:  A. Whole Foods, Plant-based plate comprising of fruits and vegetables, plant-based proteins, whole-grain carbohydrates was discussed in detail with the patient.   A list for source of those nutrients were also provided to the patient.  Patient will use only water or unsweetened tea for hydration. B.  The need to stay away from risky substances including alcohol, smoking; obtaining 7 to 9 hours of restorative sleep, at least 150 minutes of moderate intensity exercise weekly, the importance of healthy social connections,  and stress reduction techniques were discussed. C.  A full color page of  Calorie density of various food groups per pound showing examples of each food groups was provided to the patient.  - Nutritional counseling repeated at each appointment due to patients tendency to fall back in to old habits.  - The patient admits there is a room for improvement in their diet and drink choices. -  Suggestion is made for the patient to avoid simple carbohydrates from their diet including Cakes, Sweet Desserts / Pastries, Ice Cream, Soda (diet and regular), Sweet Tea, Candies, Chips, Cookies, Sweet Pastries, Store Bought Juices, Alcohol in Excess of 1-2 drinks a day, Artificial Sweeteners, Coffee Creamer, and Sugar-free Products. This will help patient to have stable blood glucose profile and potentially avoid unintended weight gain.   - I encouraged the patient to switch to unprocessed or minimally processed complex starch and increased protein intake (animal or plant source), fruits, and vegetables.   - Patient is advised to stick to a routine mealtimes to eat 3 meals a day and avoid unnecessary snacks (to snack only to correct hypoglycemia).  - I have approached her with the following individualized plan to manage her diabetes and patient agrees:   -She is advised to continue her Lantus  50 units SQ nightly and  Glipizide  5 mg XL daily with breakfast, given her optimal glucose control, no changes were made today.  I advised her to reach out if glucose starts trending lower so we can lower her insulin  if need be.  -she is encouraged to continue monitoring glucose 4 times daily (using her CGM), before meals and before bed, and to call the clinic if she has readings less than 70 or above 300 for 3 tests in a row.  - she is warned not to take insulin  without proper monitoring per orders. - Adjustment parameters are given to her for hypo and hyperglycemia in writing.  She did not tolerate Jardiance  in the past (yeast infections and worsening renal failure).  - Specific targets for  A1c;  LDL, HDL, and Triglycerides were discussed with the patient.  2) Blood Pressure /Hypertension:  her blood pressure is controlled to target.   she is advised to continue her current medications as prescribed by cardiology.  3) Lipids/Hyperlipidemia:    Review of her recent lipid panel from 04/17/23 showed controlled LDL at 73 and elevated triglycerides of 227 .  she is advised to continue Crestor  10 mg daily at bedtime.  Side effects and precautions discussed with her.  Will recheck lipid panel prior to next visit.  4)  Weight/Diet:  her Body mass index is 32.78 kg/m.  -  clearly complicating her diabetes care.   she is a candidate for weight loss. I discussed with her the fact that loss of 5 - 10% of her  current body weight will have the most impact on her diabetes management.  Exercise, and detailed carbohydrates information provided  -  detailed on discharge instructions.  5) Hypothyroidism The details of her diagnosis are not available to review.   She is on Amiodarone  which can throw off her thyroid function.    Her previsit TFTs are consistent with appropriate hormone replacement.   She is advised to continue Levothyroxine  150 mcg po daily before breakfast.  Will recheck prior to next visit and adjust dose  accordingly.  6) Chronic Care/Health Maintenance: -she is on ACEI/ARB and Statin medications and is encouraged to initiate and continue to follow up with Ophthalmology, Dentist, Podiatrist at least yearly or according to recommendations, and advised to stay away from smoking. I have recommended yearly flu vaccine and pneumonia vaccine at least every 5 years; moderate intensity exercise for up to 150 minutes weekly; and sleep for at least 7 hours a day.  - she is advised to maintain close follow up with Wendel Hals, NP for primary care needs, as well as her other providers for optimal and coordinated care.     I spent  43  minutes in the care of the patient today including review of labs from CMP, Lipids, Thyroid Function, Hematology (current and previous including abstractions from other facilities); face-to-face time discussing  her blood glucose readings/logs, discussing hypoglycemia and hyperglycemia episodes and symptoms, medications doses, her options of short and long term treatment based on the latest standards of care / guidelines;  discussion about incorporating lifestyle medicine;  and documenting the encounter. Risk reduction counseling performed per USPSTF guidelines to reduce obesity and cardiovascular risk factors.     Please refer to Patient Instructions for Blood Glucose Monitoring and Insulin /Medications Dosing Guide  in media tab for additional information. Please  also refer to  Patient Self Inventory in the Media  tab for reviewed elements of pertinent patient history.  Michele [name] participated in the discussions, expressed understanding, and voiced agreement with the above plans.  All questions were answered to her satisfaction. she is encouraged to contact clinic should she have any questions or concerns prior to her return visit.     Follow up plan: - Return in about 4 months (around 04/25/2024) for Diabetes F/U with A1c in office, Thyroid follow up, Previsit  labs, Bring meter and logs.   Hulon Magic, [facility] [address] [place], [place] [address] Phone: [phone] Fax: [phone]  12/25/2023, 2:11 PM

## 2023-12-25 NOTE — Patient Instructions (Signed)

## 2024-01-22 ENCOUNTER — Ambulatory Visit
Admission: RE | Admit: 2024-01-22 | Discharge: 2024-01-22 | Disposition: A | Discharge status: Home / Self Care | Source: Ambulatory Visit | Attending: General Surgery | Admitting: General Surgery

## 2024-01-22 DIAGNOSIS — Z1231 Encounter for screening mammogram for malignant neoplasm of breast: Secondary | ICD-10-CM | POA: Diagnosis present

## 2024-01-24 ENCOUNTER — Ambulatory Visit (INDEPENDENT_AMBULATORY_CARE_PROVIDER_SITE_OTHER): Admitting: Podiatry

## 2024-01-24 DIAGNOSIS — B351 Tinea unguium: Secondary | ICD-10-CM | POA: Diagnosis not present

## 2024-01-24 DIAGNOSIS — E1142 Type 2 diabetes mellitus with diabetic polyneuropathy: Secondary | ICD-10-CM | POA: Diagnosis not present

## 2024-01-24 DIAGNOSIS — Z794 Long term (current) use of insulin: Secondary | ICD-10-CM | POA: Diagnosis not present

## 2024-01-24 DIAGNOSIS — M79674 Pain in right toe(s): Secondary | ICD-10-CM

## 2024-01-24 DIAGNOSIS — M79675 Pain in left toe(s): Secondary | ICD-10-CM

## 2024-01-29 ENCOUNTER — Encounter: Payer: Self-pay | Admitting: Podiatry

## 2024-01-29 NOTE — Progress Notes (Signed)
  Subjective:  Patient ID: Michele [name], female    DOB: May 07, 1956,  MRN: [id number]  Michele [name] presents to clinic today for at risk foot care with history of diabetic neuropathy and painful thick toenails that are difficult to trim. Pain interferes with ambulation. Aggravating factors include wearing enclosed shoe gear. Pain is relieved with periodic professional debridement.  Chief Complaint  Patient presents with   Nail Problem    Thick painful toenails, 3 month follow up    New problem(s): None.   PCP is Therisa Benton PARAS, NP. ARNETTA 12/25/2023.  Allergies  Allergen Reactions   Entresto  [Sacubitril -Valsartan ] Other (See Comments)    hypotension    Review of Systems: Negative except as noted in the HPI.  Objective: No changes noted in today's physical examination. There were no vitals filed for this visit. Michele [name] is a pleasant 68 y.o. female WD, WN in NAD. AAO x 3.  Vascular Examination: Capillary refill time immediate b/l. Palpable pedal pulses. Pedal hair present b/l. No pain with calf compression b/l. Skin temperature gradient WNL b/l. No cyanosis or clubbing b/l. No ischemia or gangrene noted b/l.   Neurological Examination: Proprioception intact right foot. Proprioception diminished left foot.  Dermatological Examination: Pedal integument with normal turgor, texture and tone b/l LE. No open wounds b/l. No interdigital macerations b/l. Toenails 1-5 b/l elongated, thickened, discolored with subungual debris. +Tenderness with dorsal palpation of nailplates. No hyperkeratotic or porokeratotic lesions present.  Musculoskeletal Examination: Muscle strength 5/5 to all lower extremity muscle groups bilaterally. HAV with bunion deformity noted b/l LE.  Radiographs: None  Last A1c:      Latest Ref Rng & Units 12/25/2023    1:58 PM 08/27/2023    2:37 PM 04/24/2023   11:39 AM  Hemoglobin A1C  Hemoglobin-A1c 4.0 - 5.6 % 6.6  6.6  7.0    Assessment/Plan: 1. Pain  due to onychomycosis of toenails of both feet   2. Type 2 diabetes mellitus with diabetic polyneuropathy, with long-term current use of insulin  [facility])   Patient was evaluated and treated. All patient's and/or POA's questions/concerns addressed on today's visit. Toenails 1-5 debrided in length and girth without incident. Continue foot and shoe inspections daily. Monitor blood glucose per PCP/Endocrinologist's recommendations. Continue soft, supportive shoe gear daily. Report any pedal injuries to medical professional. Call office if there are any questions/concerns. -Patient/POA to call should there be question/concern in the interim.   Return in about 3 months (around 04/25/2024).  Delon LITTIE Merlin, DPM      [place] LOCATION: [address] [place] [address]                   Office [phone]   [facility] LOCATION: [address] [place], [place] [address] Office [phone]

## 2024-01-30 ENCOUNTER — Other Ambulatory Visit: Payer: Self-pay | Admitting: Cardiovascular Disease

## 2024-03-24 ENCOUNTER — Ambulatory Visit: Admitting: Nurse Practitioner

## 2024-04-07 ENCOUNTER — Telehealth: Payer: Self-pay | Admitting: Cardiovascular Disease

## 2024-04-07 DIAGNOSIS — E7849 Other hyperlipidemia: Secondary | ICD-10-CM

## 2024-04-07 MED ORDER — ROSUVASTATIN CALCIUM 10 MG PO TABS: 10.0000 mg | ORAL_TABLET | Freq: Every day | ORAL | 0 refills | Status: DC

## 2024-04-07 NOTE — Telephone Encounter (Signed)
 Pt's medication was sent to pt's pharmacy as requested. Confirmation received.

## 2024-04-07 NOTE — Telephone Encounter (Signed)
*  STAT* If patient is at the pharmacy, call can be transferred to refill team.   1. Which medications need to be refilled? (please list name of each medication and dose if known) rosuvastatin  (CRESTOR ) 10 MG tablet    2. Would you like to learn more about the convenience, safety, & potential cost savings by using the [facility]?    3. Are you open to using the [facility] (Type [facility]. ).   4. Which pharmacy/location (including street and city if local pharmacy) is medication to be sent to? [id number] - [place], [place] - [address]    5. Do they need a 30 day or 90 day supply? 90 day

## 2024-04-08 ENCOUNTER — Telehealth: Payer: Self-pay | Admitting: Cardiovascular Disease

## 2024-04-08 MED ORDER — DIGOXIN 125 MCG PO TABS: 0.0625 mg | ORAL_TABLET | Freq: Every day | ORAL | 0 refills | Status: DC

## 2024-04-08 NOTE — Telephone Encounter (Signed)
*  STAT* If patient is at the pharmacy, call can be transferred to refill team.   1. Which medications need to be refilled? (please list name of each medication and dose if known) digoxin  (LANOXIN ) 0.125 MG tablet   2. Which pharmacy/location (including street and city if local pharmacy) is medication to be sent to?  [id number] - [place], [place] - [address]    3. Do they need a 30 day or 90 day supply? 90

## 2024-04-08 NOTE — Telephone Encounter (Signed)
 Pt's medication was sent to pt's pharmacy as requested. Confirmation received.

## 2024-04-25 ENCOUNTER — Ambulatory Visit: Admitting: Podiatry

## 2024-04-30 ENCOUNTER — Ambulatory Visit: Admitting: Nurse Practitioner

## 2024-05-19 ENCOUNTER — Encounter: Payer: Self-pay | Admitting: Podiatry

## 2024-05-19 ENCOUNTER — Ambulatory Visit (INDEPENDENT_AMBULATORY_CARE_PROVIDER_SITE_OTHER): Admitting: Podiatry

## 2024-05-19 DIAGNOSIS — M79675 Pain in left toe(s): Secondary | ICD-10-CM

## 2024-05-19 DIAGNOSIS — M2012 Hallux valgus (acquired), left foot: Secondary | ICD-10-CM

## 2024-05-19 DIAGNOSIS — M2041 Other hammer toe(s) (acquired), right foot: Secondary | ICD-10-CM

## 2024-05-19 DIAGNOSIS — E1142 Type 2 diabetes mellitus with diabetic polyneuropathy: Secondary | ICD-10-CM

## 2024-05-19 DIAGNOSIS — E119 Type 2 diabetes mellitus without complications: Secondary | ICD-10-CM

## 2024-05-19 DIAGNOSIS — M79674 Pain in right toe(s): Secondary | ICD-10-CM | POA: Diagnosis not present

## 2024-05-19 DIAGNOSIS — B351 Tinea unguium: Secondary | ICD-10-CM

## 2024-05-19 DIAGNOSIS — M2042 Other hammer toe(s) (acquired), left foot: Secondary | ICD-10-CM

## 2024-05-19 DIAGNOSIS — Z794 Long term (current) use of insulin: Secondary | ICD-10-CM

## 2024-05-19 DIAGNOSIS — M2011 Hallux valgus (acquired), right foot: Secondary | ICD-10-CM | POA: Diagnosis not present

## 2024-05-19 DIAGNOSIS — Z0189 Encounter for other specified special examinations: Secondary | ICD-10-CM | POA: Diagnosis not present

## 2024-05-25 NOTE — Progress Notes (Signed)
  Subjective:  Patient ID: Michele [name], female    DOB: 14-Aug-1955,  MRN: [id number]  Michele [name] presents to clinic today for for annual diabetic foot examination, at risk foot care with history of diabetic neuropathy, and painful mycotic toenails of both feet that are difficult to trim. Pain interferes with daily activities and wearing enclosed shoe gear comfortably.  Chief Complaint  Patient presents with   Toe Pain    DFC. She doesn't have a PCP. She see a endocrinologist in Dec. A1c is 6.6   New problem(s): None.   PCP is No primary care provider on file..  Allergies  Allergen Reactions   Entresto  [Sacubitril -Valsartan ] Other (See Comments)    hypotension    Review of Systems: Negative except as noted in the HPI.  Objective: No changes noted in today's physical examination. There were no vitals filed for this visit. Michele [name] is a pleasant 68 y.o. female in NAD. AAO x 3.   Diabetic foot exam was performed with the following findings:   Intact posterior tibialis and dorsalis pedis pulses Vascular Examination: Capillary refill time immediate b/l. Palpable pedal pulses. Pedal hair present b/l. Pedal edema absent. No pain with calf compression b/l. Skin temperature gradient WNL b/l. No cyanosis or clubbing b/l. No ischemia or gangrene noted b/l.   Neurological Examination: Pt has subjective symptoms of neuropathy. Protective sensation intact 5/5 intact bilaterally with 10g monofilament b/l.  Dermatological Examination: Pedal skin with normal turgor, texture and tone b/l.  No open wounds. No interdigital macerations.   Toenails 1-5 b/l thick, discolored, elongated with subungual debris and pain on dorsal palpation.   No hyperkeratotic nor porokeratotic lesions.  Musculoskeletal Examination: Muscle strength 5/5 to all lower extremity muscle groups bilaterally. HAV with bunion deformity noted b/l LE. Hammertoes 2-5 b/l.  Radiographs: None     Assessment/Plan: 1.  Pain due to onychomycosis of toenails of both feet   2. Hallux valgus, acquired, bilateral   3. Acquired hammertoes of both feet   4. Type 2 diabetes mellitus with diabetic polyneuropathy, with long-term current use of insulin  (HCC)   5. Encounter for diabetic foot exam (HCC)   Diabetic foot examination performed today. All patient's and/or POA's questions/concerns addressed on today's visit. Toenails 1-5 b/l debrided in length and girth without incident. Continue foot and shoe inspections daily. Monitor blood glucose per PCP/Endocrinologist's recommendations. Continue soft, supportive shoe gear daily. Report any pedal injuries to medical professional. Call office if there are any questions/concerns. -Patient/POA to call should there be question/concern in the interim.   Return in about 3 months (around 08/19/2024).  Delon LITTIE Merlin, DPM      [place] LOCATION: [address] [place] [address]                   Office [phone]   [facility] LOCATION: [address] [place], [place] [address] Office [phone]

## 2024-06-07 ENCOUNTER — Other Ambulatory Visit: Payer: Self-pay | Admitting: Cardiovascular Disease

## 2024-06-17 LAB — LIPID PANEL

## 2024-06-18 LAB — CBC WITH DIFFERENTIAL/PLATELET
Basophils Absolute: 0.1 x10E3/uL (ref 0.0–0.2)
Basos: 1 %
EOS (ABSOLUTE): 0.3 x10E3/uL (ref 0.0–0.4)
Eos: 2 %
Hematocrit: 42.8 % (ref 34.0–46.6)
Hemoglobin: 14 g/dL (ref 11.1–15.9)
Immature Grans (Abs): 0.1 x10E3/uL (ref 0.0–0.1)
Immature Granulocytes: 1 %
Lymphocytes Absolute: 5.4 x10E3/uL — ABNORMAL HIGH (ref 0.7–3.1)
Lymphs: 36 %
MCH: 31.7 pg (ref 26.6–33.0)
MCHC: 32.7 g/dL (ref 31.5–35.7)
MCV: 97 fL (ref 79–97)
Monocytes Absolute: 1.1 x10E3/uL — ABNORMAL HIGH (ref 0.1–0.9)
Monocytes: 8 %
Neutrophils Absolute: 7.8 x10E3/uL — ABNORMAL HIGH (ref 1.4–7.0)
Neutrophils: 52 %
Platelets: 322 x10E3/uL (ref 150–450)
RBC: 4.42 x10E6/uL (ref 3.77–5.28)
RDW: 12.5 % (ref 11.7–15.4)
WBC: 14.9 x10E3/uL — ABNORMAL HIGH (ref 3.4–10.8)

## 2024-06-18 LAB — COMPREHENSIVE METABOLIC PANEL WITH GFR
ALT: 13 IU/L (ref 0–32)
AST: 21 IU/L (ref 0–40)
Albumin: 4.2 g/dL (ref 3.9–4.9)
Alkaline Phosphatase: 76 IU/L (ref 49–135)
BUN/Creatinine Ratio: 21 (ref 12–28)
BUN: 34 mg/dL — AB (ref 8–27)
Bilirubin Total: 0.2 mg/dL (ref 0.0–1.2)
CO2: 17 mmol/L — AB (ref 20–29)
Calcium: 9.5 mg/dL (ref 8.7–10.3)
Chloride: 106 mmol/L (ref 96–106)
Creatinine, Ser: 1.63 mg/dL — AB (ref 0.57–1.00)
Globulin, Total: 2.7 g/dL (ref 1.5–4.5)
Glucose: 130 mg/dL — AB (ref 70–99)
Potassium: 4.6 mmol/L (ref 3.5–5.2)
Sodium: 145 mmol/L — AB (ref 134–144)
Total Protein: 6.9 g/dL (ref 6.0–8.5)
eGFR: 34 mL/min/1.73 — AB (ref >59)

## 2024-06-18 LAB — MICROALBUMIN / CREATININE URINE RATIO

## 2024-06-18 LAB — LIPID PANEL
Cholesterol, Total: 166 mg/dL (ref 100–199)
HDL: 30 mg/dL — AB (ref >39)
LDL CALC COMMENT:: 5.5 ratio — AB (ref 0.0–4.4)
LDL Chol Calc (NIH): 95 mg/dL (ref 0–99)
Triglycerides: 240 mg/dL — AB (ref 0–149)
VLDL Cholesterol Cal: 41 mg/dL — AB (ref 5–40)

## 2024-06-18 LAB — VITAMIN D 25 HYDROXY (VIT D DEFICIENCY, FRACTURES): Vit D, 25-Hydroxy: 14.3 ng/mL — AB (ref 30.0–100.0)

## 2024-06-18 LAB — T4, FREE: Free T4: 1.47 ng/dL (ref 0.82–1.77)

## 2024-06-18 LAB — TSH: TSH: 1.62 u[IU]/mL (ref 0.450–4.500)

## 2024-06-20 ENCOUNTER — Ambulatory Visit (INDEPENDENT_AMBULATORY_CARE_PROVIDER_SITE_OTHER): Admitting: Nurse Practitioner

## 2024-06-20 ENCOUNTER — Other Ambulatory Visit: Payer: Self-pay | Admitting: Nurse Practitioner

## 2024-06-20 ENCOUNTER — Encounter: Payer: Self-pay | Admitting: Nurse Practitioner

## 2024-06-20 VITALS — BP 124/80 | HR 66 | Ht 62.0 in | Wt 181.4 lb

## 2024-06-20 DIAGNOSIS — E119 Type 2 diabetes mellitus without complications: Secondary | ICD-10-CM

## 2024-06-20 DIAGNOSIS — Z7984 Long term (current) use of oral hypoglycemic drugs: Secondary | ICD-10-CM | POA: Diagnosis not present

## 2024-06-20 DIAGNOSIS — Z794 Long term (current) use of insulin: Secondary | ICD-10-CM | POA: Diagnosis not present

## 2024-06-20 DIAGNOSIS — E039 Hypothyroidism, unspecified: Secondary | ICD-10-CM

## 2024-06-20 DIAGNOSIS — E559 Vitamin D deficiency, unspecified: Secondary | ICD-10-CM | POA: Diagnosis not present

## 2024-06-20 LAB — POCT GLYCOSYLATED HEMOGLOBIN (HGB A1C): Hemoglobin A1C: 7.6 % — AB (ref 4.0–5.6)

## 2024-06-20 MED ORDER — LEVOTHYROXINE SODIUM 150 MCG PO TABS: 150.0000 ug | ORAL_TABLET | Freq: Every morning | ORAL | 3 refills | Status: AC

## 2024-06-20 MED ORDER — ONETOUCH ULTRA VI STRP: ORAL_STRIP | 3 refills | Status: AC

## 2024-06-20 MED ORDER — GLIPIZIDE ER 5 MG PO TB24: 5.0000 mg | ORAL_TABLET | Freq: Every day | ORAL | 1 refills | Status: AC

## 2024-06-20 MED ORDER — LANTUS SOLOSTAR 100 UNIT/ML ~~LOC~~ SOPN: 50.0000 [IU] | PEN_INJECTOR | Freq: Every day | SUBCUTANEOUS | 3 refills | Status: AC

## 2024-06-20 MED ORDER — INSULIN PEN NEEDLE 32G X 4 MM MISC: 3 refills | Status: AC

## 2024-06-20 MED ORDER — VITAMIN D (ERGOCALCIFEROL) 1.25 MG (50000 UNIT) PO CAPS: 50000.0000 [IU] | ORAL_CAPSULE | ORAL | 1 refills | Status: AC

## 2024-06-20 NOTE — Progress Notes (Signed)
 Endocrinology Follow Up Note       06/20/2024, 8:47 AM   Subjective:    Patient ID: Michele [name], female    DOB: April 19, 1956.  Michele [name] is being seen in follow up after being seen in consultation for management of currently uncontrolled symptomatic diabetes requested by  Pcp, No.   Past Medical History:  Diagnosis Date   Abscess of breast, right 08/19/2012   Breast cancer (HCC) 07/13/2010   1.5 cm,intermediate grade DCIS, nuclear grade 2, ER 90%, PR 90% treated with wide excision, reexcision to negative margins and MammoSite partial breast radiation.   Coronary artery disease    a. 2009 Cath: nonobs LAD dzs; b. 07/2021 Cath: LM nl, LAD 49m, D1 mild dzs, D2 40, LCX nl, RCA 40p. PCWP 24. PA 37/26(30).   DCIS (ductal carcinoma in situ) of breast, right 2012   Diabetes mellitus (HCC)    Type II   GERD (gastroesophageal reflux disease)    HFrEF (heart failure with reduced ejection fraction) (HCC)    a. 2009 Echo: LV dysfxn; b. 2013 Echo: EF 50-55%; c. 07/2021 Echo: EF<20%, glob HK - in/inflat moves best. Nl PASP. Sev dil LA. Mildly dil RA. Mild-mod MR; d. 01/2022 Echo: EF 50-55%, no rwma, mod LVH, GrI DD, nl RV fxn.   Hyperlipidemia    Hypertension    Ischemic cardiomyopathy    a. 2009 Echo: LV dysfxn; b. 2013 Echo: EF 50-55%; c. 07/2021 Echo: EF<20% d. 01/2022 Echo: EF 50-55%, no rwma, mod LVH, GrI DD, nl RV fxn.   Obesity, unspecified    Persistent atrial fibrillation (HCC)    a. 07/2021 s/p TEE and DCCV (150J)-->amio/eliquis  (CHA2DS2VASc 6).   Personal history of radiation therapy    Sleep apnea    Thyroid disease    hypothyroidism   Umbilical hernia without mention of obstruction or gangrene     Past Surgical History:  Procedure Laterality Date   BREAST BIOPSY Right 2007   right bx neg   BREAST BIOPSY Right 08/10/2016   fat necrosis/ done in Dr. Fredirick office   BREAST EXCISIONAL BIOPSY Right 2011    DCIS mammosite lumpectomy   BREAST LUMPECTOMY Right Jan 2012   Wide excision   BREAST MASS EXCISION Right 2011   December   CARDIAC CATHETERIZATION  01/08/2008   CHOLECYSTECTOMY  2013   [facility]   HERNIA REPAIR  2013   epigastric   INCISION AND DRAINAGE BREAST ABSCESS Right 08/19/2012   RIGHT/LEFT HEART CATH AND CORONARY ANGIOGRAPHY N/A 07/15/2021   Procedure: RIGHT/LEFT HEART CATH AND CORONARY ANGIOGRAPHY;  Surgeon: Darron Deatrice LABOR, MD;  Location: [facility];  Service: Cardiovascular;  Laterality: N/A;   TEE WITHOUT CARDIOVERSION N/A 07/19/2021   Procedure: TRANSESOPHAGEAL ECHOCARDIOGRAM (TEE);  Surgeon: Gollan, Timothy J, MD;  Location: [facility];  Service: Cardiovascular;  Laterality: N/A;   TONSILLECTOMY AND ADENOIDECTOMY     age 7 yrs    Social History   Socioeconomic History   Marital status: Widowed    Spouse name: Lytle   Number of children: 0   Years of education: Not on file   Highest education level: Not on  file  Occupational History   Occupation: retired    Associate Professor: [employer]  Tobacco Use   Smoking status: Never   Smokeless tobacco: Never  Vaping Use   Vaping status: Never Used  Substance and Sexual Activity   Alcohol use: No   Drug use: No   Sexual activity: Not on file  Other Topics Concern   Not on file  Social History Narrative   Never smoked; no alcohol; lives in [place]; self;    husband passed in 2020.    Retired from [employer].    Good relationship with in-laws - they help when she needs it   Social Drivers of Health   Tobacco Use: Low Risk (06/20/2024)   Patient History    Smoking Tobacco Use: Never    Smokeless Tobacco Use: Never    Passive Exposure: Not on file  Financial Resource Strain: Low Risk (10/24/2021)   Overall Financial Resource Strain (CARDIA)    Difficulty of Paying Living Expenses: Not hard at all  Food Insecurity: No Food Insecurity (10/24/2021)   Hunger Vital Sign    Worried About Running  Out of Food in the Last Year: Never true    Ran Out of Food in the Last Year: Never true  Transportation Needs: No Transportation Needs (10/24/2021)   PRAPARE - Administrator, Civil Service (Medical): No    Lack of Transportation (Non-Medical): No  Recent Concern: Transportation Needs - Unmet Transportation Needs (08/05/2021)   PRAPARE - Transportation    Lack of Transportation (Medical): Yes    Lack of Transportation (Non-Medical): Yes  Physical Activity: Insufficiently Active (10/24/2021)   Exercise Vital Sign    Days of Exercise per Week: 5 days    Minutes of Exercise per Session: 20 min  Stress: No Stress Concern Present (10/24/2021)   [employer] of Occupational Health - Occupational Stress Questionnaire    Feeling of Stress : Not at all  Social Connections: Socially Isolated (10/24/2021)   Social Connection and Isolation Panel    Frequency of Communication with Friends and Family: More than three times a week    Frequency of Social Gatherings with Friends and Family: More than three times a week    Attends Religious Services: Never    Database Administrator or Organizations: No    Attends Banker Meetings: Never    Marital Status: Widowed  Depression (PHQ2-9): Low Risk (03/20/2022)   Depression (PHQ2-9)    PHQ-2 Score: 4  Alcohol Screen: Low Risk (03/20/2022)   Alcohol Screen    Last Alcohol Screening Score (AUDIT): 0  Housing: Unknown (01/29/2024)   Received from [facility]   Epic    Unable to Pay for Housing in the Last Year: Not on file    Number of Times Moved in the Last Year: Not on file    At any time in the past 12 months, were you homeless or living in a shelter (including now)?: No  Utilities: Not on file  Health Literacy: Not on file    Family History  Problem Relation Age of Onset   Heart failure Mother    Diabetes Mother    Congestive Heart Failure Mother    CVA Mother    Heart attack Father    Arthritis  Father    Diabetes Sister    Hypertension Sister    Lung cancer Sister    Hypertension Brother    Hypertension Brother    Hemochromatosis Brother  Stroke Maternal Grandmother    Heart attack Maternal Grandfather    Heart attack Paternal Grandmother    Breast cancer Neg Hx     Outpatient Encounter Medications as of 06/20/2024  Medication Sig   amiodarone  (PACERONE ) 200 MG tablet TAKE 1 TABLET BY MOUTH EVERY DAY   Blood Glucose Monitoring Suppl (ONE TOUCH ULTRA SYSTEM KIT) w/Device KIT Check sugar once daily. DX E11.9   digoxin  (LANOXIN ) 0.125 MG tablet Take 0.5 tablets (0.0625 mg total) by mouth daily.   ELIQUIS  5 MG TABS tablet TAKE 1 TABLET BY MOUTH TWICE A DAY   fluticasone (FLONASE) 50 MCG/ACT nasal spray Place 2 sprays into the nose daily. (Patient taking differently: Place 2 sprays into the nose daily. Patient takes as needed)   Lancets (ONETOUCH ULTRASOFT) lancets Check sugar once daily DX E11.9   losartan  (COZAAR ) 25 MG tablet TAKE 1/2 TABLET BY MOUTH DAILY   metoprolol  succinate (TOPROL -XL) 25 MG 24 hr tablet TAKE 1 TABLET (25 MG TOTAL) BY MOUTH DAILY.   NON FORMULARY CPAP AT BEDTIME   rosuvastatin  (CRESTOR ) 10 MG tablet Take 1 tablet (10 mg total) by mouth daily.   spironolactone  (ALDACTONE ) 25 MG tablet TAKE 1 TABLET (25 MG TOTAL) BY MOUTH DAILY.   torsemide  (DEMADEX ) 20 MG tablet TAKE 1 TABLET BY MOUTH EVERY DAY   Vitamin D , Ergocalciferol , (DRISDOL) 1.25 MG (50000 UNIT) CAPS capsule Take 1 capsule (50,000 Units total) by mouth every 7 (seven) days.   [DISCONTINUED] glipiZIDE  (GLUCOTROL  XL) 5 MG 24 hr tablet Take 1 tablet (5 mg total) by mouth daily with breakfast.   [DISCONTINUED] glucose blood (ONETOUCH ULTRA) test strip Use to check blood glucose four times daily as directed   [DISCONTINUED] insulin  glargine (LANTUS  SOLOSTAR) 100 UNIT/ML Solostar Pen Inject 50 Units into the skin at bedtime.   [DISCONTINUED] insulin  glargine (LANTUS ) 100 UNIT/ML Solostar Pen Inject 50  Units into the skin at bedtime.   [DISCONTINUED] Insulin  Pen Needle 32G X 4 MM MISC Inject twice daily, SQ. DX E11.9   [DISCONTINUED] levothyroxine  (SYNTHROID ) 150 MCG tablet Take 1 tablet (150 mcg total) by mouth every morning.   glipiZIDE  (GLUCOTROL  XL) 5 MG 24 hr tablet Take 1 tablet (5 mg total) by mouth daily with breakfast.   glucose blood (ONETOUCH ULTRA) test strip Use to check blood glucose four times daily as directed   insulin  glargine (LANTUS  SOLOSTAR) 100 UNIT/ML Solostar Pen Inject 50 Units into the skin at bedtime.   Insulin  Pen Needle 32G X 4 MM MISC Inject twice daily, SQ. DX E11.9   levothyroxine  (SYNTHROID ) 150 MCG tablet Take 1 tablet (150 mcg total) by mouth every morning.   [DISCONTINUED] prednisoLONE acetate (PRED FORTE) 1 % ophthalmic suspension Place 1 drop into both eyes 4 (four) times daily.   No facility-administered encounter medications on file as of 06/20/2024.    ALLERGIES: Allergies  Allergen Reactions   Entresto  [Sacubitril -Valsartan ] Other (See Comments)    hypotension    VACCINATION STATUS: Immunization History  Administered Date(s) Administered   Moderna Covid-19 Fall Seasonal Vaccine 73yrs & older 05/22/2023   PNEUMOCOCCAL CONJUGATE-20 06/22/2021   Pneumococcal Polysaccharide-23 05/13/2009   Tdap 05/13/2009    Diabetes She presents for her follow-up diabetic visit. She has type 2 diabetes mellitus. Onset time: Diagnosed at approx age of 55. Her disease course has been worsening. There are no hypoglycemic associated symptoms. Associated symptoms include foot paresthesias. There are no hypoglycemic complications. Symptoms are stable. Diabetic complications include heart disease (CHF) and nephropathy.  Risk factors for coronary artery disease include diabetes mellitus, dyslipidemia, family history, hypertension, sedentary lifestyle and post-menopausal. Current diabetic treatment includes insulin  injections and oral agent (monotherapy). She is compliant  with treatment most of the time. Her weight is fluctuating minimally. She is following a generally healthy diet. When asked about meal planning, she reported none. She has had a previous visit with a dietitian. She participates in exercise intermittently. Her home blood glucose trend is fluctuating minimally. Her breakfast blood glucose range is generally 110-130 mg/dl. Her bedtime blood glucose range is generally 180-200 mg/dl. Her overall blood glucose range is 140-180 mg/dl. (She presents today, with her CGM and meter showing mostly at goal glycemic profile.  Her POCT A1c today is 7.6%, increasing from last visit of 6.6%.  Analysis of her CGM shows TIR 70%, TAR 30%, TBR 0% with a GMI of 7.1%.  She denies any severe hypoglycemia.  She did have gout flare several times since last visit, notes that her activity during that time was not good, neither was her diet.) An ACE inhibitor/angiotensin II receptor blocker is being taken. She sees a podiatrist.Eye exam is current.   Review of systems  Constitutional: + increasing body weight,  current Body mass index is 33.18 kg/m. , no fatigue, no subjective hyperthermia, no subjective hypothermia Eyes: no blurry vision, no xerophthalmia ENT: no sore throat, no nodules palpated in throat, no dysphagia/odynophagia, no hoarseness Cardiovascular: no chest pain, no shortness of breath, no palpitations, no leg swelling Respiratory: no cough, no shortness of breath Gastrointestinal: no nausea/vomiting/diarrhea Musculoskeletal: no muscle/joint aches Skin: no rashes, no hyperemia Neurological: no tremors, no numbness, no tingling, no dizziness Psychiatric: no depression, no anxiety  Objective:     BP 124/80 (BP Location: Left Arm, Patient Position: Sitting, Cuff Size: Large)   Pulse 66   Ht 5' 2 (1.575 m)   Wt 181 lb 6.4 oz (82.3 kg)   BMI 33.18 kg/m   Wt Readings from Last 3 Encounters:  06/20/24 181 lb 6.4 oz (82.3 kg)  12/25/23 179 lb 3.2 oz (81.3 kg)   08/27/23 175 lb 6.4 oz (79.6 kg)     BP Readings from Last 3 Encounters:  06/20/24 124/80  12/25/23 132/80  08/27/23 120/80      Physical Exam- Limited  Constitutional:  Body mass index is 33.18 kg/m. , not in acute distress, normal state of mind Eyes:  EOMI, no exophthalmos Musculoskeletal: no gross deformities, strength intact in all four extremities, no gross restriction of joint movements Skin:  no rashes, no hyperemia Neurological: no tremor with outstretched hands   Diabetic Foot Exam - Simple   No data filed     CMP ( most recent) CMP     Component Value Date/Time   NA 145 (H) 06/11/2024 1039   K 4.6 06/11/2024 1039   CL 106 06/11/2024 1039   CO2 17 (L) 06/11/2024 1039   GLUCOSE 130 (H) 06/11/2024 1039   GLUCOSE 261 (H) 01/17/2022 1427   BUN 34 (H) 06/11/2024 1039   CREATININE 1.63 (H) 06/11/2024 1039   CALCIUM  9.5 06/11/2024 1039   PROT 6.9 06/11/2024 1039   ALBUMIN 4.2 06/11/2024 1039   AST 21 06/11/2024 1039   ALT 13 06/11/2024 1039   ALKPHOS 76 06/11/2024 1039   BILITOT 0.2 06/11/2024 1039   GFRNONAA 40 (L) 01/17/2022 1427   GFRAA >60 12/16/2019 1214     Diabetic Labs (most recent): Lab Results  Component Value Date   HGBA1C 7.6 (A) 06/20/2024  HGBA1C 6.6 (A) 12/25/2023   HGBA1C 6.6 (A) 08/27/2023     Lipid Panel ( most recent) Lipid Panel     Component Value Date/Time   CHOL 166 06/11/2024 1039   TRIG 240 (H) 06/11/2024 1039   HDL 30 (L) 06/11/2024 1039   CHOLHDL 5.5 (H) 06/11/2024 1039   LDLCALC 95 06/11/2024 1039   LABVLDL 41 (H) 06/11/2024 1039      Lab Results  Component Value Date   TSH 1.620 06/11/2024   TSH 0.889 12/12/2023   TSH 1.630 04/17/2023   TSH 8.127 (H) 07/13/2021   TSH 7.020 (H) 06/22/2021   TSH 0.841 11/19/2019   TSH 14.720 (H) 03/18/2018   TSH 2.610 10/18/2016   TSH 0.978 10/11/2015   TSH 40.220 (H) 06/02/2015   FREET4 1.47 06/11/2024   FREET4 1.54 12/12/2023   FREET4 1.60 04/17/2023   FREET4 0.65  07/13/2021   FREET4 1.50 01/15/2015           Assessment & Plan:   1) 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  Her POCT A1c today is 7.6%, increasing from last visit of 6.6%.  Analysis of her CGM shows TIR 70%, TAR 30%, TBR 0% with a GMI of 7.1%.  She denies any severe hypoglycemia.  She did have gout flare several times since last visit, notes that her activity during that time was not good, neither was her diet.  - Michele [name] has currently uncontrolled symptomatic type 2 DM since 68 years of age.   -Recent labs reviewed.  - I had a long discussion with her about the progressive nature of diabetes and the pathology behind its complications. -her diabetes is complicated by CKD stage 3b, CHF, neuropathy and she remains at a high risk for more acute and chronic complications which include CAD, CVA, CKD, retinopathy, and neuropathy. These are all discussed in detail with her.  The following Lifestyle Medicine recommendations according to American College of Lifestyle Medicine [facility]) were discussed and offered to patient and she agrees to start the journey:  A. Whole Foods, Plant-based plate comprising of fruits and vegetables, plant-based proteins, whole-grain carbohydrates was discussed in detail with the patient.   A list for source of those nutrients were also provided to the patient.  Patient will use only water or unsweetened tea for hydration. B.  The need to stay away from risky substances including alcohol, smoking; obtaining 7 to 9 hours of restorative sleep, at least 150 minutes of moderate intensity exercise weekly, the importance of healthy social connections,  and stress reduction techniques were discussed. C.  A full color page of  Calorie density of various food groups per pound showing examples of each food groups was provided to the patient.  - Nutritional  counseling repeated/built upon at each appointment.  - The patient admits there is a room for improvement in their diet and drink choices. -  Suggestion is made for the patient to avoid simple carbohydrates from their diet including Cakes, Sweet Desserts / Pastries, Ice Cream, Soda (diet and regular), Sweet Tea, Candies, Chips, Cookies, Sweet Pastries, Store Bought Juices, Alcohol in Excess of 1-2 drinks a day, Artificial Sweeteners, Coffee Creamer, and Sugar-free Products. This will help patient to have stable blood glucose profile and potentially avoid unintended weight gain.   - I encouraged the patient to switch to unprocessed or minimally processed complex  starch and increased protein intake (animal or plant source), fruits, and vegetables.   - Patient is advised to stick to a routine mealtimes to eat 3 meals a day and avoid unnecessary snacks (to snack only to correct hypoglycemia).  - I have approached her with the following individualized plan to manage her diabetes and patient agrees:   -She is advised to continue her Lantus  50 units SQ nightly and Glipizide  5 mg XL daily with breakfast, given her optimal glucose control, no changes were made today.   -she is encouraged to continue monitoring glucose 4 times daily (using her CGM), before meals and before bed, and to call the clinic if she has readings less than 70 or above 300 for 3 tests in a row.  - she is warned not to take insulin  without proper monitoring per orders. - Adjustment parameters are given to her for hypo and hyperglycemia in writing.  She did not tolerate Jardiance  in the past (yeast infections and worsening renal failure).  - Specific targets for  A1c; LDL, HDL, and Triglycerides were discussed with the patient.  2) Blood Pressure /Hypertension:  her blood pressure is controlled to target.   she is advised to continue her current medications as prescribed by cardiology.  3) Lipids/Hyperlipidemia:    Review of  her recent lipid panel from 06/11/24 showed controlled LDL at 95 and elevated triglycerides of 240 (worsening) .  she is advised to continue Crestor  10 mg daily at bedtime.  Side effects and precautions discussed with her.  We did talk about potentially adding Fenofibrate to help with triglycerides, but she wants to try lifestyle modifications first.  4)  Weight/Diet:  her Body mass index is 33.18 kg/m.  -  clearly complicating her diabetes care.   she is a candidate for weight loss. I discussed with her the fact that loss of 5 - 10% of her  current body weight will have the most impact on her diabetes management.  Exercise, and detailed carbohydrates information provided  -  detailed on discharge instructions.  5) Hypothyroidism The details of her diagnosis are not available to review.   She is on Amiodarone  which can throw off her thyroid function.    Her previsit TFTs are consistent with appropriate hormone replacement.   She is advised to continue Levothyroxine  150 mcg po daily before breakfast.    - The correct intake of thyroid hormone (Levothyroxine , Synthroid ), is on empty stomach first thing in the morning, with water, separated by at least 30 minutes from breakfast and other medications,  and separated by more than 4 hours from calcium , iron, multivitamins, acid reflux medications (PPIs).  - This medication is a life-long medication and will be needed to correct thyroid hormone imbalances for the rest of your life.  The dose may change from time to time, based on thyroid blood work.  - It is extremely important to be consistent taking this medication, near the same time each morning.  -AVOID TAKING PRODUCTS CONTAINING BIOTIN (commonly found in Hair, Skin, Nails vitamins) AS IT INTERFERES WITH THE VALIDITY OF THYROID FUNCTION BLOOD TESTS.  6) Chronic Care/Health Maintenance: -she is on ACEI/ARB and Statin medications and is encouraged to initiate and continue to follow up with  Ophthalmology, Dentist, Podiatrist at least yearly or according to recommendations, and advised to stay away from smoking. I have recommended yearly flu vaccine and pneumonia vaccine at least every 5 years; moderate intensity exercise for up to 150 minutes weekly; and sleep for  at least 7 hours a day.  7) Vitamin D  deficiency Her recent vitamin D  level from 06/11/24 was low at 14.3.  She is not currently on any supplementation.  Will start Ergocalciferol  50000 units po weekly x 12 weeks.  - she is advised to maintain close follow up with Pcp, No for primary care needs, as well as her other providers for optimal and coordinated care.     I spent  46  minutes in the care of the patient today including review of labs from CMP, Lipids, Thyroid Function, Hematology (current and previous including abstractions from other facilities); face-to-face time discussing  her blood glucose readings/logs, discussing hypoglycemia and hyperglycemia episodes and symptoms, medications doses, her options of short and long term treatment based on the latest standards of care / guidelines;  discussion about incorporating lifestyle medicine;  and documenting the encounter. Risk reduction counseling performed per USPSTF guidelines to reduce obesity and cardiovascular risk factors.     Please refer to Patient Instructions for Blood Glucose Monitoring and Insulin /Medications Dosing Guide  in media tab for additional information. Please  also refer to  Patient Self Inventory in the Media  tab for reviewed elements of pertinent patient history.  Michele [name] participated in the discussions, expressed understanding, and voiced agreement with the above plans.  All questions were answered to her satisfaction. she is encouraged to contact clinic should she have any questions or concerns prior to her return visit.    Follow up plan: - Return in about 4 months (around 10/19/2024) for Diabetes F/U with A1c in office, No previsit  labs, Bring meter and logs.   Benton Rio, [facility] [address] [place], [place] [address] Phone: [phone] Fax: [phone]  06/20/2024, 8:47 AM

## 2024-06-23 ENCOUNTER — Telehealth: Payer: Self-pay | Admitting: Nurse Practitioner

## 2024-06-23 MED ORDER — ACCU-CHEK GUIDE W/DEVICE KIT: PACK | 0 refills | Status: AC

## 2024-06-23 MED ORDER — ACCU-CHEK SOFTCLIX LANCETS MISC: 4 refills | Status: AC

## 2024-06-23 MED ORDER — ACCU-CHEK GUIDE TEST VI STRP: ORAL_STRIP | 12 refills | Status: DC

## 2024-06-23 NOTE — Telephone Encounter (Signed)
 Patient is asking if you can send her CGM to [facility], she is having issues with [facility]

## 2024-06-23 NOTE — Telephone Encounter (Signed)
 I have already sent this in to [facility] based on our last visit.  They should be contacting her within the next few days.

## 2024-06-30 ENCOUNTER — Telehealth: Payer: Self-pay | Admitting: Nurse Practitioner

## 2024-06-30 ENCOUNTER — Other Ambulatory Visit: Payer: Self-pay | Admitting: Nurse Practitioner

## 2024-06-30 DIAGNOSIS — E119 Type 2 diabetes mellitus without complications: Secondary | ICD-10-CM

## 2024-06-30 MED ORDER — ACCU-CHEK SOFTCLIX LANCETS MISC: 11 refills | Status: AC

## 2024-06-30 MED ORDER — ACCU-CHEK GUIDE TEST VI STRP: ORAL_STRIP | 11 refills | Status: AC

## 2024-06-30 NOTE — Telephone Encounter (Signed)
 Pt left message that the amount of lancets and strips sent in for the accucheck needed to be a less amount for insurance to cover it.

## 2024-06-30 NOTE — Telephone Encounter (Signed)
 I resent the script for less, hopefully insurance will now cover it.

## 2024-07-01 ENCOUNTER — Other Ambulatory Visit: Payer: Self-pay | Admitting: Cardiovascular Disease

## 2024-07-01 DIAGNOSIS — I4891 Unspecified atrial fibrillation: Secondary | ICD-10-CM

## 2024-07-06 ENCOUNTER — Other Ambulatory Visit: Payer: Self-pay | Admitting: Cardiovascular Disease

## 2024-07-06 DIAGNOSIS — E7849 Other hyperlipidemia: Secondary | ICD-10-CM

## 2024-07-09 ENCOUNTER — Other Ambulatory Visit: Payer: Self-pay | Admitting: Cardiovascular Disease

## 2024-07-23 ENCOUNTER — Other Ambulatory Visit: Payer: Self-pay | Admitting: Cardiovascular Disease

## 2024-07-23 NOTE — Telephone Encounter (Signed)
 CMP done on 06/11/24

## 2024-07-29 ENCOUNTER — Other Ambulatory Visit: Payer: Self-pay | Admitting: Cardiovascular Disease

## 2024-08-15 ENCOUNTER — Ambulatory Visit: Admitting: Cardiovascular Disease

## 2024-08-25 ENCOUNTER — Ambulatory Visit: Admitting: Podiatry

## 2024-10-21 ENCOUNTER — Ambulatory Visit: Admitting: Cardiovascular Disease

## 2024-10-22 ENCOUNTER — Ambulatory Visit: Admitting: Nurse Practitioner
